# Patient Record
Sex: Male | Born: 1935 | Race: White | Hispanic: No | Marital: Single | Smoking: Former smoker
Health system: Southern US, Community
[De-identification: ages and names within clinical notes are randomized; demographics above are authoritative.]

## PROBLEM LIST (undated history)

## (undated) DIAGNOSIS — C3490 Malignant neoplasm of unspecified part of unspecified bronchus or lung: Secondary | ICD-10-CM

## (undated) DIAGNOSIS — F419 Anxiety disorder, unspecified: Secondary | ICD-10-CM

## (undated) DIAGNOSIS — R0602 Shortness of breath: Secondary | ICD-10-CM

## (undated) DIAGNOSIS — E785 Hyperlipidemia, unspecified: Secondary | ICD-10-CM

## (undated) DIAGNOSIS — R42 Dizziness and giddiness: Secondary | ICD-10-CM

## (undated) DIAGNOSIS — R6889 Other general symptoms and signs: Secondary | ICD-10-CM

## (undated) DIAGNOSIS — Z923 Personal history of irradiation: Secondary | ICD-10-CM

## (undated) DIAGNOSIS — Z9221 Personal history of antineoplastic chemotherapy: Secondary | ICD-10-CM

## (undated) DIAGNOSIS — IMO0001 Reserved for inherently not codable concepts without codable children: Secondary | ICD-10-CM

## (undated) DIAGNOSIS — J439 Emphysema, unspecified: Secondary | ICD-10-CM

## (undated) DIAGNOSIS — K5790 Diverticulosis of intestine, part unspecified, without perforation or abscess without bleeding: Secondary | ICD-10-CM

## (undated) DIAGNOSIS — I259 Chronic ischemic heart disease, unspecified: Secondary | ICD-10-CM

## (undated) DIAGNOSIS — J449 Chronic obstructive pulmonary disease, unspecified: Secondary | ICD-10-CM

## (undated) DIAGNOSIS — M1990 Unspecified osteoarthritis, unspecified site: Secondary | ICD-10-CM

## (undated) DIAGNOSIS — I313 Pericardial effusion (noninflammatory): Principal | ICD-10-CM

## (undated) DIAGNOSIS — I10 Essential (primary) hypertension: Secondary | ICD-10-CM

## (undated) DIAGNOSIS — E739 Lactose intolerance, unspecified: Secondary | ICD-10-CM

## (undated) DIAGNOSIS — K219 Gastro-esophageal reflux disease without esophagitis: Secondary | ICD-10-CM

## (undated) DIAGNOSIS — J90 Pleural effusion, not elsewhere classified: Secondary | ICD-10-CM

## (undated) HISTORY — DX: Chronic obstructive pulmonary disease, unspecified: J44.9

## (undated) HISTORY — PX: HERNIA REPAIR: SHX51

## (undated) HISTORY — DX: Malignant neoplasm of unspecified part of unspecified bronchus or lung: C34.90

## (undated) HISTORY — DX: Shortness of breath: R06.02

## (undated) HISTORY — DX: Emphysema, unspecified: J43.9

## (undated) HISTORY — DX: Essential (primary) hypertension: I10

## (undated) HISTORY — DX: Other general symptoms and signs: R68.89

## (undated) HISTORY — DX: Pericardial effusion (noninflammatory): I31.3

## (undated) HISTORY — PX: CATARACT EXTRACTION, BILATERAL: SHX1313

## (undated) HISTORY — DX: Reserved for inherently not codable concepts without codable children: IMO0001

## (undated) HISTORY — DX: Gastro-esophageal reflux disease without esophagitis: K21.9

## (undated) HISTORY — DX: Lactose intolerance, unspecified: E73.9

## (undated) HISTORY — DX: Chronic ischemic heart disease, unspecified: I25.9

## (undated) HISTORY — DX: Hyperlipidemia, unspecified: E78.5

## (undated) HISTORY — DX: Diverticulosis of intestine, part unspecified, without perforation or abscess without bleeding: K57.90

## (undated) HISTORY — DX: Pleural effusion, not elsewhere classified: J90

---

## 1999-07-07 HISTORY — PX: CORONARY STENT PLACEMENT: SHX1402

## 1999-07-30 ENCOUNTER — Inpatient Hospital Stay (HOSPITAL_COMMUNITY): Admission: EM | Admit: 1999-07-30 | Discharge: 1999-08-03 | Payer: Self-pay | Admitting: Emergency Medicine

## 1999-08-27 ENCOUNTER — Encounter (HOSPITAL_COMMUNITY): Admission: RE | Admit: 1999-08-27 | Discharge: 1999-11-25 | Payer: Self-pay | Admitting: Cardiology

## 1999-10-08 ENCOUNTER — Observation Stay (HOSPITAL_COMMUNITY): Admission: EM | Admit: 1999-10-08 | Discharge: 1999-10-09 | Payer: Self-pay | Admitting: Emergency Medicine

## 1999-10-08 ENCOUNTER — Encounter: Payer: Self-pay | Admitting: Cardiology

## 2000-01-30 ENCOUNTER — Encounter: Admission: RE | Admit: 2000-01-30 | Discharge: 2000-01-30 | Payer: Self-pay | Admitting: Internal Medicine

## 2000-01-30 ENCOUNTER — Encounter: Payer: Self-pay | Admitting: Internal Medicine

## 2000-06-04 ENCOUNTER — Inpatient Hospital Stay (HOSPITAL_COMMUNITY): Admission: RE | Admit: 2000-06-04 | Discharge: 2000-06-05 | Payer: Self-pay | Admitting: Cardiology

## 2000-11-25 ENCOUNTER — Ambulatory Visit (HOSPITAL_COMMUNITY): Admission: RE | Admit: 2000-11-25 | Discharge: 2000-11-25 | Payer: Self-pay | Admitting: Cardiology

## 2001-03-22 ENCOUNTER — Ambulatory Visit (HOSPITAL_COMMUNITY): Admission: RE | Admit: 2001-03-22 | Discharge: 2001-03-22 | Payer: Self-pay | Admitting: Cardiology

## 2003-06-13 ENCOUNTER — Ambulatory Visit (HOSPITAL_COMMUNITY): Admission: RE | Admit: 2003-06-13 | Discharge: 2003-06-13 | Payer: Self-pay | Admitting: Urology

## 2003-07-11 ENCOUNTER — Ambulatory Visit (HOSPITAL_COMMUNITY): Admission: RE | Admit: 2003-07-11 | Discharge: 2003-07-11 | Payer: Self-pay | Admitting: Cardiology

## 2003-07-11 HISTORY — PX: CARDIAC CATHETERIZATION: SHX172

## 2003-09-02 ENCOUNTER — Emergency Department (HOSPITAL_COMMUNITY): Admission: EM | Admit: 2003-09-02 | Discharge: 2003-09-02 | Payer: Self-pay | Admitting: Emergency Medicine

## 2007-04-08 DIAGNOSIS — IMO0001 Reserved for inherently not codable concepts without codable children: Secondary | ICD-10-CM

## 2007-04-08 HISTORY — DX: Reserved for inherently not codable concepts without codable children: IMO0001

## 2007-05-09 HISTORY — PX: CARDIOVASCULAR STRESS TEST: SHX262

## 2009-01-04 ENCOUNTER — Encounter: Admission: RE | Admit: 2009-01-04 | Discharge: 2009-01-04 | Payer: Self-pay | Admitting: Internal Medicine

## 2009-01-08 ENCOUNTER — Ambulatory Visit: Payer: Self-pay | Admitting: Oncology

## 2009-01-12 ENCOUNTER — Encounter: Admission: RE | Admit: 2009-01-12 | Discharge: 2009-01-12 | Payer: Self-pay | Admitting: Family Medicine

## 2009-01-15 ENCOUNTER — Ambulatory Visit (HOSPITAL_COMMUNITY): Admission: RE | Admit: 2009-01-15 | Discharge: 2009-01-15 | Payer: Self-pay | Admitting: Internal Medicine

## 2009-01-23 ENCOUNTER — Ambulatory Visit (HOSPITAL_COMMUNITY): Admission: RE | Admit: 2009-01-23 | Discharge: 2009-01-23 | Payer: Self-pay | Admitting: Otolaryngology

## 2009-01-23 LAB — COMPREHENSIVE METABOLIC PANEL
ALT: 21 U/L (ref 0–53)
BUN: 17 mg/dL (ref 6–23)
CO2: 29 mEq/L (ref 19–32)
Calcium: 9.3 mg/dL (ref 8.4–10.5)
Chloride: 106 mEq/L (ref 96–112)
Creatinine, Ser: 0.99 mg/dL (ref 0.40–1.50)

## 2009-01-23 LAB — CBC WITH DIFFERENTIAL/PLATELET
Basophils Absolute: 0.1 10*3/uL (ref 0.0–0.1)
Eosinophils Absolute: 0.2 10*3/uL (ref 0.0–0.5)
HCT: 44.7 % (ref 38.4–49.9)
HGB: 14.7 g/dL (ref 13.0–17.1)
MONO#: 0.5 10*3/uL (ref 0.1–0.9)
NEUT%: 55.3 % (ref 39.0–75.0)
WBC: 5.9 10*3/uL (ref 4.0–10.3)
lymph#: 1.9 10*3/uL (ref 0.9–3.3)

## 2009-01-23 LAB — LACTATE DEHYDROGENASE: LDH: 128 U/L (ref 94–250)

## 2009-02-01 ENCOUNTER — Ambulatory Visit (HOSPITAL_COMMUNITY): Admission: RE | Admit: 2009-02-01 | Discharge: 2009-02-01 | Payer: Self-pay | Admitting: Oncology

## 2009-02-01 ENCOUNTER — Encounter (INDEPENDENT_AMBULATORY_CARE_PROVIDER_SITE_OTHER): Payer: Self-pay | Admitting: Interventional Radiology

## 2009-02-07 ENCOUNTER — Ambulatory Visit: Payer: Self-pay | Admitting: Oncology

## 2009-02-12 ENCOUNTER — Ambulatory Visit (HOSPITAL_COMMUNITY): Admission: RE | Admit: 2009-02-12 | Discharge: 2009-02-12 | Payer: Self-pay | Admitting: Otolaryngology

## 2009-02-12 ENCOUNTER — Encounter (INDEPENDENT_AMBULATORY_CARE_PROVIDER_SITE_OTHER): Payer: Self-pay | Admitting: Otolaryngology

## 2009-02-14 ENCOUNTER — Ambulatory Visit: Admission: RE | Admit: 2009-02-14 | Discharge: 2009-04-06 | Payer: Self-pay | Admitting: Radiation Oncology

## 2009-02-21 LAB — LACTATE DEHYDROGENASE: LDH: 160 U/L (ref 94–250)

## 2009-02-21 LAB — CBC WITH DIFFERENTIAL/PLATELET
Basophils Absolute: 0 10*3/uL (ref 0.0–0.1)
Eosinophils Absolute: 0.4 10*3/uL (ref 0.0–0.5)
HCT: 44.8 % (ref 38.4–49.9)
HGB: 15 g/dL (ref 13.0–17.1)
LYMPH%: 26.6 % (ref 14.0–49.0)
MCV: 92 fL (ref 79.3–98.0)
MONO#: 0.7 10*3/uL (ref 0.1–0.9)
NEUT#: 4.3 10*3/uL (ref 1.5–6.5)
NEUT%: 58.1 % (ref 39.0–75.0)
Platelets: 241 10*3/uL (ref 140–400)
RBC: 4.88 10*6/uL (ref 4.20–5.82)
WBC: 7.4 10*3/uL (ref 4.0–10.3)

## 2009-02-21 LAB — COMPREHENSIVE METABOLIC PANEL
Albumin: 4.4 g/dL (ref 3.5–5.2)
BUN: 14 mg/dL (ref 6–23)
CO2: 21 mEq/L (ref 19–32)
Glucose, Bld: 86 mg/dL (ref 70–99)
Sodium: 140 mEq/L (ref 135–145)
Total Bilirubin: 0.5 mg/dL (ref 0.3–1.2)
Total Protein: 6.8 g/dL (ref 6.0–8.3)

## 2009-02-28 LAB — CBC WITH DIFFERENTIAL/PLATELET
BASO%: 1.1 % (ref 0.0–2.0)
HCT: 45.2 % (ref 38.4–49.9)
MCHC: 33.8 g/dL (ref 32.0–36.0)
MONO#: 0.4 10*3/uL (ref 0.1–0.9)
NEUT#: 3.6 10*3/uL (ref 1.5–6.5)
RBC: 4.98 10*6/uL (ref 4.20–5.82)
WBC: 5.5 10*3/uL (ref 4.0–10.3)
lymph#: 1.3 10*3/uL (ref 0.9–3.3)

## 2009-03-07 ENCOUNTER — Ambulatory Visit: Payer: Self-pay | Admitting: Oncology

## 2009-03-07 LAB — CBC WITH DIFFERENTIAL/PLATELET
Basophils Absolute: 0 10*3/uL (ref 0.0–0.1)
EOS%: 3.5 % (ref 0.0–7.0)
HCT: 41.3 % (ref 38.4–49.9)
HGB: 14 g/dL (ref 13.0–17.1)
LYMPH%: 22.9 % (ref 14.0–49.0)
MCH: 30.7 pg (ref 27.2–33.4)
MCV: 90.6 fL (ref 79.3–98.0)
MONO%: 8.5 % (ref 0.0–14.0)
NEUT%: 64.1 % (ref 39.0–75.0)
RDW: 12.7 % (ref 11.0–14.6)

## 2009-03-07 LAB — COMPREHENSIVE METABOLIC PANEL
Alkaline Phosphatase: 84 U/L (ref 39–117)
BUN: 11 mg/dL (ref 6–23)
Glucose, Bld: 92 mg/dL (ref 70–99)
Total Bilirubin: 0.4 mg/dL (ref 0.3–1.2)

## 2009-03-14 LAB — CBC WITH DIFFERENTIAL/PLATELET
Basophils Absolute: 0 10*3/uL (ref 0.0–0.1)
Eosinophils Absolute: 0 10*3/uL (ref 0.0–0.5)
HGB: 13.9 g/dL (ref 13.0–17.1)
LYMPH%: 22.8 % (ref 14.0–49.0)
MCV: 90.7 fL (ref 79.3–98.0)
MONO#: 0.4 10*3/uL (ref 0.1–0.9)
MONO%: 10.1 % (ref 0.0–14.0)
NEUT#: 2.3 10*3/uL (ref 1.5–6.5)
Platelets: 228 10*3/uL (ref 140–400)
RDW: 12.8 % (ref 11.0–14.6)

## 2009-03-19 LAB — COMPREHENSIVE METABOLIC PANEL
Alkaline Phosphatase: 76 U/L (ref 39–117)
BUN: 14 mg/dL (ref 6–23)
CO2: 26 mEq/L (ref 19–32)
Creatinine, Ser: 0.79 mg/dL (ref 0.40–1.50)
Glucose, Bld: 115 mg/dL — ABNORMAL HIGH (ref 70–99)
Sodium: 139 mEq/L (ref 135–145)
Total Bilirubin: 0.7 mg/dL (ref 0.3–1.2)
Total Protein: 6.4 g/dL (ref 6.0–8.3)

## 2009-03-19 LAB — CBC WITH DIFFERENTIAL/PLATELET
Eosinophils Absolute: 0 10*3/uL (ref 0.0–0.5)
HCT: 42.2 % (ref 38.4–49.9)
LYMPH%: 17.8 % (ref 14.0–49.0)
MCV: 92.6 fL (ref 79.3–98.0)
MONO%: 7.6 % (ref 0.0–14.0)
NEUT#: 2.4 10*3/uL (ref 1.5–6.5)
NEUT%: 73.2 % (ref 39.0–75.0)
Platelets: 231 10*3/uL (ref 140–400)
RBC: 4.56 10*6/uL (ref 4.20–5.82)

## 2009-03-19 LAB — LACTATE DEHYDROGENASE: LDH: 154 U/L (ref 94–250)

## 2009-03-21 LAB — CBC WITH DIFFERENTIAL/PLATELET
Eosinophils Absolute: 0 10*3/uL (ref 0.0–0.5)
HCT: 41.4 % (ref 38.4–49.9)
HGB: 14 g/dL (ref 13.0–17.1)
LYMPH%: 21.1 % (ref 14.0–49.0)
MONO#: 0.5 10*3/uL (ref 0.1–0.9)
NEUT#: 1.9 10*3/uL (ref 1.5–6.5)
NEUT%: 62 % (ref 39.0–75.0)
Platelets: 197 10*3/uL (ref 140–400)
WBC: 3.1 10*3/uL — ABNORMAL LOW (ref 4.0–10.3)
lymph#: 0.7 10*3/uL — ABNORMAL LOW (ref 0.9–3.3)

## 2009-03-28 LAB — CBC WITH DIFFERENTIAL/PLATELET
BASO%: 1.1 % (ref 0.0–2.0)
Basophils Absolute: 0 10*3/uL (ref 0.0–0.1)
EOS%: 1.1 % (ref 0.0–7.0)
HCT: 39.7 % (ref 38.4–49.9)
HGB: 13.6 g/dL (ref 13.0–17.1)
LYMPH%: 14 % (ref 14.0–49.0)
MCH: 30.9 pg (ref 27.2–33.4)
MCHC: 34.3 g/dL (ref 32.0–36.0)
MONO#: 0.5 10*3/uL (ref 0.1–0.9)
NEUT%: 69.3 % (ref 39.0–75.0)
Platelets: 170 10*3/uL (ref 140–400)
lymph#: 0.5 10*3/uL — ABNORMAL LOW (ref 0.9–3.3)

## 2009-03-28 LAB — COMPREHENSIVE METABOLIC PANEL
ALT: 19 U/L (ref 0–53)
BUN: 14 mg/dL (ref 6–23)
CO2: 25 mEq/L (ref 19–32)
Calcium: 8.7 mg/dL (ref 8.4–10.5)
Chloride: 103 mEq/L (ref 96–112)
Creatinine, Ser: 0.78 mg/dL (ref 0.40–1.50)
Total Bilirubin: 0.5 mg/dL (ref 0.3–1.2)

## 2009-03-28 LAB — LACTATE DEHYDROGENASE: LDH: 191 U/L (ref 94–250)

## 2009-04-04 LAB — CBC WITH DIFFERENTIAL/PLATELET
Basophils Absolute: 0 10*3/uL (ref 0.0–0.1)
Eosinophils Absolute: 0 10*3/uL (ref 0.0–0.5)
HCT: 37.9 % — ABNORMAL LOW (ref 38.4–49.9)
HGB: 12.8 g/dL — ABNORMAL LOW (ref 13.0–17.1)
MONO#: 0.5 10*3/uL (ref 0.1–0.9)
NEUT#: 1.4 10*3/uL — ABNORMAL LOW (ref 1.5–6.5)
NEUT%: 59 % (ref 39.0–75.0)
RDW: 14.2 % (ref 11.0–14.6)
lymph#: 0.4 10*3/uL — ABNORMAL LOW (ref 0.9–3.3)

## 2009-04-05 ENCOUNTER — Ambulatory Visit: Payer: Self-pay | Admitting: Oncology

## 2009-04-08 ENCOUNTER — Ambulatory Visit: Admission: RE | Admit: 2009-04-08 | Discharge: 2009-04-20 | Payer: Self-pay | Admitting: Radiation Oncology

## 2009-04-11 LAB — CBC WITH DIFFERENTIAL/PLATELET
BASO%: 0.8 % (ref 0.0–2.0)
LYMPH%: 29.6 % (ref 14.0–49.0)
MCHC: 34.5 g/dL (ref 32.0–36.0)
MONO#: 0.6 10*3/uL (ref 0.1–0.9)
RBC: 4.15 10*6/uL — ABNORMAL LOW (ref 4.20–5.82)
RDW: 15.5 % — ABNORMAL HIGH (ref 11.0–14.6)
WBC: 2.9 10*3/uL — ABNORMAL LOW (ref 4.0–10.3)
lymph#: 0.9 10*3/uL (ref 0.9–3.3)

## 2009-04-11 LAB — COMPREHENSIVE METABOLIC PANEL
Albumin: 3.9 g/dL (ref 3.5–5.2)
CO2: 23 mEq/L (ref 19–32)
Glucose, Bld: 92 mg/dL (ref 70–99)
Potassium: 4 mEq/L (ref 3.5–5.3)
Sodium: 141 mEq/L (ref 135–145)
Total Protein: 6.1 g/dL (ref 6.0–8.3)

## 2009-04-11 LAB — LACTATE DEHYDROGENASE: LDH: 197 U/L (ref 94–250)

## 2009-04-27 LAB — COMPREHENSIVE METABOLIC PANEL
ALT: 13 U/L (ref 0–53)
AST: 18 U/L (ref 0–37)
Albumin: 4.2 g/dL (ref 3.5–5.2)
BUN: 15 mg/dL (ref 6–23)
Calcium: 8.8 mg/dL (ref 8.4–10.5)
Chloride: 107 mEq/L (ref 96–112)
Potassium: 4.5 mEq/L (ref 3.5–5.3)

## 2009-04-27 LAB — CBC WITH DIFFERENTIAL/PLATELET
BASO%: 1.4 % (ref 0.0–2.0)
HCT: 38.7 % (ref 38.4–49.9)
HGB: 13.3 g/dL (ref 13.0–17.1)
MCHC: 34.3 g/dL (ref 32.0–36.0)
MONO#: 0.6 10*3/uL (ref 0.1–0.9)
NEUT#: 1 10*3/uL — ABNORMAL LOW (ref 1.5–6.5)
NEUT%: 24.8 % — ABNORMAL LOW (ref 39.0–75.0)
WBC: 3.9 10*3/uL — ABNORMAL LOW (ref 4.0–10.3)
lymph#: 2.2 10*3/uL (ref 0.9–3.3)

## 2009-05-22 ENCOUNTER — Ambulatory Visit: Payer: Self-pay | Admitting: Oncology

## 2009-05-22 ENCOUNTER — Ambulatory Visit (HOSPITAL_COMMUNITY): Admission: RE | Admit: 2009-05-22 | Discharge: 2009-05-22 | Payer: Self-pay | Admitting: Oncology

## 2009-05-24 LAB — CBC WITH DIFFERENTIAL/PLATELET
Basophils Absolute: 0 10*3/uL (ref 0.0–0.1)
Eosinophils Absolute: 0.2 10*3/uL (ref 0.0–0.5)
HGB: 13.7 g/dL (ref 13.0–17.1)
LYMPH%: 27.2 % (ref 14.0–49.0)
MCV: 96 fL (ref 79.3–98.0)
MONO%: 14.2 % — ABNORMAL HIGH (ref 0.0–14.0)
NEUT#: 2.3 10*3/uL (ref 1.5–6.5)
Platelets: 217 10*3/uL (ref 140–400)

## 2009-05-24 LAB — CEA: CEA: 1.5 ng/mL (ref 0.0–5.0)

## 2009-05-24 LAB — COMPREHENSIVE METABOLIC PANEL
CO2: 29 mEq/L (ref 19–32)
Creatinine, Ser: 0.88 mg/dL (ref 0.40–1.50)
Glucose, Bld: 81 mg/dL (ref 70–99)
Total Bilirubin: 0.6 mg/dL (ref 0.3–1.2)
Total Protein: 6.6 g/dL (ref 6.0–8.3)

## 2009-05-30 LAB — CBC WITH DIFFERENTIAL/PLATELET
BASO%: 0.9 % (ref 0.0–2.0)
HCT: 44.1 % (ref 38.4–49.9)
MCHC: 34 g/dL (ref 32.0–36.0)
MONO#: 0.6 10*3/uL (ref 0.1–0.9)
NEUT%: 47.8 % (ref 39.0–75.0)
RDW: 13.5 % (ref 11.0–14.6)
WBC: 4.5 10*3/uL (ref 4.0–10.3)
lymph#: 1.4 10*3/uL (ref 0.9–3.3)

## 2009-06-06 LAB — CBC WITH DIFFERENTIAL/PLATELET
BASO%: 0.9 % (ref 0.0–2.0)
HCT: 38.5 % (ref 38.4–49.9)
MCHC: 35 g/dL (ref 32.0–36.0)
MONO#: 0.1 10*3/uL (ref 0.1–0.9)
NEUT#: 1.8 10*3/uL (ref 1.5–6.5)
RBC: 4.02 10*6/uL — ABNORMAL LOW (ref 4.20–5.82)
WBC: 3.2 10*3/uL — ABNORMAL LOW (ref 4.0–10.3)
lymph#: 1.2 10*3/uL (ref 0.9–3.3)

## 2009-06-13 LAB — CBC WITH DIFFERENTIAL/PLATELET
Basophils Absolute: 0 10*3/uL (ref 0.0–0.1)
Eosinophils Absolute: 0.1 10*3/uL (ref 0.0–0.5)
HCT: 41.2 % (ref 38.4–49.9)
HGB: 13.7 g/dL (ref 13.0–17.1)
MONO#: 0.5 10*3/uL (ref 0.1–0.9)
NEUT%: 61.4 % (ref 39.0–75.0)
WBC: 4 10*3/uL (ref 4.0–10.3)
lymph#: 1 10*3/uL (ref 0.9–3.3)

## 2009-06-13 LAB — COMPREHENSIVE METABOLIC PANEL
Albumin: 4.3 g/dL (ref 3.5–5.2)
Alkaline Phosphatase: 71 U/L (ref 39–117)
CO2: 27 mEq/L (ref 19–32)
Glucose, Bld: 113 mg/dL — ABNORMAL HIGH (ref 70–99)
Potassium: 4 mEq/L (ref 3.5–5.3)
Sodium: 141 mEq/L (ref 135–145)
Total Protein: 6.6 g/dL (ref 6.0–8.3)

## 2009-06-25 ENCOUNTER — Ambulatory Visit: Payer: Self-pay | Admitting: Oncology

## 2009-06-27 LAB — CBC WITH DIFFERENTIAL/PLATELET
BASO%: 0.4 % (ref 0.0–2.0)
EOS%: 0.8 % (ref 0.0–7.0)
Eosinophils Absolute: 0 10*3/uL (ref 0.0–0.5)
MCV: 95.9 fL (ref 79.3–98.0)
MONO%: 14.7 % — ABNORMAL HIGH (ref 0.0–14.0)
NEUT#: 2.6 10*3/uL (ref 1.5–6.5)
RBC: 3.78 10*6/uL — ABNORMAL LOW (ref 4.20–5.82)
RDW: 13 % (ref 11.0–14.6)
WBC: 4 10*3/uL (ref 4.0–10.3)

## 2009-06-27 LAB — BASIC METABOLIC PANEL
Calcium: 9.2 mg/dL (ref 8.4–10.5)
Chloride: 104 mEq/L (ref 96–112)
Creatinine, Ser: 0.93 mg/dL (ref 0.40–1.50)
Sodium: 136 mEq/L (ref 135–145)

## 2009-07-05 LAB — CBC WITH DIFFERENTIAL/PLATELET
Basophils Absolute: 0 10*3/uL (ref 0.0–0.1)
Eosinophils Absolute: 0 10*3/uL (ref 0.0–0.5)
HGB: 12.1 g/dL — ABNORMAL LOW (ref 13.0–17.1)
MCV: 94.9 fL (ref 79.3–98.0)
NEUT#: 0.9 10*3/uL — ABNORMAL LOW (ref 1.5–6.5)
RDW: 13.2 % (ref 11.0–14.6)
lymph#: 0.8 10*3/uL — ABNORMAL LOW (ref 0.9–3.3)

## 2009-07-10 LAB — CBC WITH DIFFERENTIAL/PLATELET
BASO%: 0.4 % (ref 0.0–2.0)
EOS%: 1 % (ref 0.0–7.0)
HCT: 37.5 % — ABNORMAL LOW (ref 38.4–49.9)
HGB: 12.8 g/dL — ABNORMAL LOW (ref 13.0–17.1)
MCH: 32.5 pg (ref 27.2–33.4)
MCHC: 34.2 g/dL (ref 32.0–36.0)
MONO#: 0.5 10*3/uL (ref 0.1–0.9)
NEUT%: 64.3 % (ref 39.0–75.0)
RDW: 13.9 % (ref 11.0–14.6)
WBC: 3.3 10*3/uL — ABNORMAL LOW (ref 4.0–10.3)
lymph#: 0.7 10*3/uL — ABNORMAL LOW (ref 0.9–3.3)

## 2009-07-10 LAB — COMPREHENSIVE METABOLIC PANEL
ALT: 14 U/L (ref 0–53)
AST: 19 U/L (ref 0–37)
Albumin: 4 g/dL (ref 3.5–5.2)
CO2: 29 mEq/L (ref 19–32)
Calcium: 9.3 mg/dL (ref 8.4–10.5)
Chloride: 105 mEq/L (ref 96–112)
Potassium: 4.3 mEq/L (ref 3.5–5.3)
Total Protein: 7.1 g/dL (ref 6.0–8.3)

## 2009-07-10 LAB — LACTATE DEHYDROGENASE: LDH: 182 U/L (ref 94–250)

## 2009-07-11 ENCOUNTER — Ambulatory Visit (HOSPITAL_COMMUNITY): Admission: RE | Admit: 2009-07-11 | Discharge: 2009-07-11 | Payer: Self-pay | Admitting: Oncology

## 2009-07-18 ENCOUNTER — Ambulatory Visit (HOSPITAL_COMMUNITY): Admission: RE | Admit: 2009-07-18 | Discharge: 2009-07-18 | Payer: Self-pay | Admitting: Oncology

## 2009-07-18 LAB — CBC WITH DIFFERENTIAL/PLATELET
BASO%: 1 % (ref 0.0–2.0)
Basophils Absolute: 0 10*3/uL (ref 0.0–0.1)
EOS%: 2.7 % (ref 0.0–7.0)
MCH: 31.4 pg (ref 27.2–33.4)
MCHC: 33.5 g/dL (ref 32.0–36.0)
MCV: 93.9 fL (ref 79.3–98.0)
MONO%: 27.2 % — ABNORMAL HIGH (ref 0.0–14.0)
RBC: 4.07 10*6/uL — ABNORMAL LOW (ref 4.20–5.82)
RDW: 13.7 % (ref 11.0–14.6)
lymph#: 0.9 10*3/uL (ref 0.9–3.3)

## 2009-07-25 ENCOUNTER — Ambulatory Visit: Payer: Self-pay | Admitting: Oncology

## 2009-07-25 LAB — CBC WITH DIFFERENTIAL/PLATELET
BASO%: 1.1 % (ref 0.0–2.0)
LYMPH%: 19 % (ref 14.0–49.0)
MCHC: 34.3 g/dL (ref 32.0–36.0)
MONO#: 1.1 10*3/uL — ABNORMAL HIGH (ref 0.1–0.9)
MONO%: 19 % — ABNORMAL HIGH (ref 0.0–14.0)
Platelets: 287 10*3/uL (ref 140–400)
RBC: 4.07 10*6/uL — ABNORMAL LOW (ref 4.20–5.82)
RDW: 14.1 % (ref 11.0–14.6)
WBC: 5.8 10*3/uL (ref 4.0–10.3)

## 2009-08-09 ENCOUNTER — Ambulatory Visit (HOSPITAL_COMMUNITY): Admission: RE | Admit: 2009-08-09 | Discharge: 2009-08-09 | Payer: Self-pay | Admitting: Oncology

## 2009-08-09 LAB — CBC WITH DIFFERENTIAL/PLATELET
BASO%: 0.4 % (ref 0.0–2.0)
EOS%: 2.2 % (ref 0.0–7.0)
Eosinophils Absolute: 0.2 10*3/uL (ref 0.0–0.5)
MCH: 30.5 pg (ref 27.2–33.4)
MCHC: 32.6 g/dL (ref 32.0–36.0)
MCV: 93.4 fL (ref 79.3–98.0)
MONO%: 14.7 % — ABNORMAL HIGH (ref 0.0–14.0)
NEUT#: 4.9 10*3/uL (ref 1.5–6.5)
RBC: 4.07 10*6/uL — ABNORMAL LOW (ref 4.20–5.82)
RDW: 14.4 % (ref 11.0–14.6)
nRBC: 0 % (ref 0–0)

## 2009-08-09 LAB — COMPREHENSIVE METABOLIC PANEL
ALT: 9 U/L (ref 0–53)
Alkaline Phosphatase: 102 U/L (ref 39–117)
Creatinine, Ser: 0.82 mg/dL (ref 0.40–1.50)
Sodium: 141 mEq/L (ref 135–145)
Total Bilirubin: 0.3 mg/dL (ref 0.3–1.2)
Total Protein: 6.2 g/dL (ref 6.0–8.3)

## 2009-08-22 LAB — COMPREHENSIVE METABOLIC PANEL
ALT: 9 U/L (ref 0–53)
BUN: 14 mg/dL (ref 6–23)
CO2: 27 mEq/L (ref 19–32)
Calcium: 8.9 mg/dL (ref 8.4–10.5)
Chloride: 105 mEq/L (ref 96–112)
Creatinine, Ser: 0.88 mg/dL (ref 0.40–1.50)
Total Bilirubin: 0.4 mg/dL (ref 0.3–1.2)

## 2009-08-22 LAB — CBC WITH DIFFERENTIAL/PLATELET
BASO%: 0.2 % (ref 0.0–2.0)
Basophils Absolute: 0 10*3/uL (ref 0.0–0.1)
EOS%: 0.6 % (ref 0.0–7.0)
HCT: 35.5 % — ABNORMAL LOW (ref 38.4–49.9)
HGB: 12 g/dL — ABNORMAL LOW (ref 13.0–17.1)
LYMPH%: 9.2 % — ABNORMAL LOW (ref 14.0–49.0)
MCH: 31.2 pg (ref 27.2–33.4)
MCHC: 33.7 g/dL (ref 32.0–36.0)
MONO#: 0.8 10*3/uL (ref 0.1–0.9)
NEUT%: 82.2 % — ABNORMAL HIGH (ref 39.0–75.0)
Platelets: 230 10*3/uL (ref 140–400)

## 2009-08-22 LAB — LACTATE DEHYDROGENASE: LDH: 192 U/L (ref 94–250)

## 2009-09-05 ENCOUNTER — Ambulatory Visit: Payer: Self-pay | Admitting: Oncology

## 2009-09-06 LAB — COMPREHENSIVE METABOLIC PANEL
ALT: 9 U/L (ref 0–53)
AST: 15 U/L (ref 0–37)
Albumin: 4.1 g/dL (ref 3.5–5.2)
Alkaline Phosphatase: 80 U/L (ref 39–117)
Potassium: 4.5 mEq/L (ref 3.5–5.3)
Sodium: 141 mEq/L (ref 135–145)
Total Protein: 6.3 g/dL (ref 6.0–8.3)

## 2009-09-06 LAB — CBC WITH DIFFERENTIAL/PLATELET
BASO%: 2.1 % — ABNORMAL HIGH (ref 0.0–2.0)
Eosinophils Absolute: 0.1 10*3/uL (ref 0.0–0.5)
LYMPH%: 17.6 % (ref 14.0–49.0)
MCHC: 32 g/dL (ref 32.0–36.0)
MCV: 93.5 fL (ref 79.3–98.0)
MONO%: 16.1 % — ABNORMAL HIGH (ref 0.0–14.0)
Platelets: 250 10*3/uL (ref 140–400)
RBC: 4.01 10*6/uL — ABNORMAL LOW (ref 4.20–5.82)
nRBC: 0 % (ref 0–0)

## 2009-09-12 ENCOUNTER — Inpatient Hospital Stay (HOSPITAL_COMMUNITY): Admission: EM | Admit: 2009-09-12 | Discharge: 2009-09-13 | Payer: Self-pay | Admitting: Emergency Medicine

## 2009-09-13 ENCOUNTER — Encounter (INDEPENDENT_AMBULATORY_CARE_PROVIDER_SITE_OTHER): Payer: Self-pay | Admitting: Internal Medicine

## 2009-09-20 LAB — CBC WITH DIFFERENTIAL/PLATELET
BASO%: 0.2 % (ref 0.0–2.0)
EOS%: 2.2 % (ref 0.0–7.0)
HCT: 32.2 % — ABNORMAL LOW (ref 38.4–49.9)
MCH: 30.3 pg (ref 27.2–33.4)
MCHC: 32 g/dL (ref 32.0–36.0)
MONO%: 12.6 % (ref 0.0–14.0)
NEUT%: 72.7 % (ref 39.0–75.0)
lymph#: 1.2 10*3/uL (ref 0.9–3.3)

## 2009-10-05 DIAGNOSIS — I3139 Other pericardial effusion (noninflammatory): Secondary | ICD-10-CM

## 2009-10-05 DIAGNOSIS — I313 Pericardial effusion (noninflammatory): Secondary | ICD-10-CM

## 2009-10-05 HISTORY — PX: PERICARDIAL WINDOW: SHX2213

## 2009-10-05 HISTORY — DX: Pericardial effusion (noninflammatory): I31.3

## 2009-10-05 HISTORY — DX: Other pericardial effusion (noninflammatory): I31.39

## 2009-10-11 ENCOUNTER — Ambulatory Visit: Payer: Self-pay | Admitting: Oncology

## 2009-10-12 ENCOUNTER — Ambulatory Visit (HOSPITAL_COMMUNITY): Admission: RE | Admit: 2009-10-12 | Discharge: 2009-10-12 | Payer: Self-pay | Admitting: Oncology

## 2009-10-15 LAB — CBC WITH DIFFERENTIAL/PLATELET
Basophils Absolute: 0.1 10*3/uL (ref 0.0–0.1)
EOS%: 3.4 % (ref 0.0–7.0)
HCT: 38.3 % — ABNORMAL LOW (ref 38.4–49.9)
HGB: 12.3 g/dL — ABNORMAL LOW (ref 13.0–17.1)
LYMPH%: 20.4 % (ref 14.0–49.0)
MCH: 30 pg (ref 27.2–33.4)
NEUT%: 63.4 % (ref 39.0–75.0)
Platelets: 248 10*3/uL (ref 140–400)
lymph#: 1 10*3/uL (ref 0.9–3.3)

## 2009-10-15 LAB — COMPREHENSIVE METABOLIC PANEL
Albumin: 4 g/dL (ref 3.5–5.2)
Alkaline Phosphatase: 71 U/L (ref 39–117)
BUN: 18 mg/dL (ref 6–23)
Calcium: 9.2 mg/dL (ref 8.4–10.5)
Chloride: 109 mEq/L (ref 96–112)
Glucose, Bld: 125 mg/dL — ABNORMAL HIGH (ref 70–99)
Potassium: 3.9 mEq/L (ref 3.5–5.3)

## 2009-10-24 ENCOUNTER — Ambulatory Visit: Payer: Self-pay | Admitting: Cardiothoracic Surgery

## 2009-10-24 ENCOUNTER — Inpatient Hospital Stay (HOSPITAL_COMMUNITY): Admission: RE | Admit: 2009-10-24 | Discharge: 2009-10-30 | Payer: Self-pay | Admitting: Cardiothoracic Surgery

## 2009-10-25 ENCOUNTER — Ambulatory Visit: Payer: Self-pay | Admitting: Cardiothoracic Surgery

## 2009-10-25 ENCOUNTER — Encounter: Payer: Self-pay | Admitting: Cardiothoracic Surgery

## 2009-11-02 ENCOUNTER — Ambulatory Visit: Payer: Self-pay | Admitting: Cardiothoracic Surgery

## 2009-11-06 ENCOUNTER — Ambulatory Visit (HOSPITAL_COMMUNITY): Admission: RE | Admit: 2009-11-06 | Discharge: 2009-11-06 | Payer: Self-pay | Admitting: Cardiology

## 2009-11-06 ENCOUNTER — Ambulatory Visit: Payer: Self-pay | Admitting: Cardiology

## 2009-11-13 ENCOUNTER — Ambulatory Visit: Payer: Self-pay | Admitting: Oncology

## 2009-11-13 LAB — CBC WITH DIFFERENTIAL/PLATELET
BASO%: 1.1 % (ref 0.0–2.0)
EOS%: 5.3 % (ref 0.0–7.0)
HCT: 38.3 % — ABNORMAL LOW (ref 38.4–49.9)
LYMPH%: 19.3 % (ref 14.0–49.0)
MCH: 29.7 pg (ref 27.2–33.4)
MCHC: 33.1 g/dL (ref 32.0–36.0)
MCV: 89.8 fL (ref 79.3–98.0)
MONO%: 10.8 % (ref 0.0–14.0)
NEUT%: 63.5 % (ref 39.0–75.0)
Platelets: 306 10*3/uL (ref 140–400)
RBC: 4.26 10*6/uL (ref 4.20–5.82)

## 2009-11-13 LAB — COMPREHENSIVE METABOLIC PANEL
ALT: 13 U/L (ref 0–53)
AST: 18 U/L (ref 0–37)
Alkaline Phosphatase: 75 U/L (ref 39–117)
CO2: 26 mEq/L (ref 19–32)
Creatinine, Ser: 0.93 mg/dL (ref 0.40–1.50)
Sodium: 142 mEq/L (ref 135–145)
Total Bilirubin: 0.3 mg/dL (ref 0.3–1.2)

## 2009-11-13 LAB — LACTATE DEHYDROGENASE: LDH: 149 U/L (ref 94–250)

## 2009-11-21 ENCOUNTER — Encounter: Admission: RE | Admit: 2009-11-21 | Discharge: 2009-11-21 | Payer: Self-pay | Admitting: Cardiothoracic Surgery

## 2009-11-21 ENCOUNTER — Ambulatory Visit: Payer: Self-pay | Admitting: Cardiothoracic Surgery

## 2009-12-05 ENCOUNTER — Ambulatory Visit: Payer: Self-pay | Admitting: Cardiothoracic Surgery

## 2009-12-05 ENCOUNTER — Encounter: Admission: RE | Admit: 2009-12-05 | Discharge: 2009-12-05 | Payer: Self-pay | Admitting: Cardiothoracic Surgery

## 2009-12-21 ENCOUNTER — Ambulatory Visit: Payer: Self-pay | Admitting: Oncology

## 2009-12-25 ENCOUNTER — Ambulatory Visit: Payer: Self-pay | Admitting: Cardiology

## 2009-12-25 ENCOUNTER — Encounter: Admission: RE | Admit: 2009-12-25 | Discharge: 2009-12-25 | Payer: Self-pay | Admitting: Cardiology

## 2009-12-25 ENCOUNTER — Ambulatory Visit (HOSPITAL_COMMUNITY): Admission: RE | Admit: 2009-12-25 | Discharge: 2009-12-25 | Payer: Self-pay | Admitting: Oncology

## 2009-12-25 ENCOUNTER — Encounter: Payer: Self-pay | Admitting: Pulmonary Disease

## 2009-12-25 LAB — CBC WITH DIFFERENTIAL/PLATELET
Basophils Absolute: 0 10*3/uL (ref 0.0–0.1)
EOS%: 5.2 % (ref 0.0–7.0)
Eosinophils Absolute: 0.3 10*3/uL (ref 0.0–0.5)
HGB: 13.2 g/dL (ref 13.0–17.1)
MCV: 84.7 fL (ref 79.3–98.0)
MONO%: 14.3 % — ABNORMAL HIGH (ref 0.0–14.0)
NEUT#: 4.4 10*3/uL (ref 1.5–6.5)
RBC: 4.75 10*6/uL (ref 4.20–5.82)
RDW: 14.4 % (ref 11.0–14.6)
lymph#: 0.6 10*3/uL — ABNORMAL LOW (ref 0.9–3.3)

## 2009-12-25 LAB — COMPREHENSIVE METABOLIC PANEL
ALT: 13 U/L (ref 0–53)
Albumin: 3.7 g/dL (ref 3.5–5.2)
Alkaline Phosphatase: 108 U/L (ref 39–117)
Potassium: 4.4 mEq/L (ref 3.5–5.3)
Sodium: 138 mEq/L (ref 135–145)
Total Bilirubin: 0.7 mg/dL (ref 0.3–1.2)
Total Protein: 6.6 g/dL (ref 6.0–8.3)

## 2009-12-25 LAB — BRAIN NATRIURETIC PEPTIDE: Brain Natriuretic Peptide: 127 pg/mL — ABNORMAL HIGH (ref 0.0–100.0)

## 2009-12-25 LAB — LACTATE DEHYDROGENASE: LDH: 228 U/L (ref 94–250)

## 2009-12-25 LAB — D-DIMER, QUANTITATIVE: D-Dimer, Quant: 1.46 ug/mL-FEU — ABNORMAL HIGH (ref 0.00–0.48)

## 2010-01-01 ENCOUNTER — Encounter: Payer: Self-pay | Admitting: Pulmonary Disease

## 2010-01-01 ENCOUNTER — Ambulatory Visit (HOSPITAL_COMMUNITY): Admission: RE | Admit: 2010-01-01 | Discharge: 2010-01-01 | Payer: Self-pay | Admitting: Oncology

## 2010-01-01 ENCOUNTER — Encounter (HOSPITAL_COMMUNITY): Payer: Self-pay | Admitting: Oncology

## 2010-01-01 ENCOUNTER — Ambulatory Visit: Payer: Self-pay | Admitting: Cardiology

## 2010-01-03 ENCOUNTER — Ambulatory Visit: Payer: Self-pay | Admitting: Cardiothoracic Surgery

## 2010-01-04 ENCOUNTER — Ambulatory Visit (HOSPITAL_COMMUNITY): Admission: RE | Admit: 2010-01-04 | Discharge: 2010-01-04 | Payer: Self-pay | Admitting: Oncology

## 2010-01-04 ENCOUNTER — Encounter: Payer: Self-pay | Admitting: Pulmonary Disease

## 2010-01-11 LAB — CBC WITH DIFFERENTIAL/PLATELET
BASO%: 0.1 % (ref 0.0–2.0)
EOS%: 0.1 % (ref 0.0–7.0)
HGB: 13.6 g/dL (ref 13.0–17.1)
MCH: 28.4 pg (ref 27.2–33.4)
MCHC: 33.2 g/dL (ref 32.0–36.0)
RBC: 4.81 10*6/uL (ref 4.20–5.82)
RDW: 16 % — ABNORMAL HIGH (ref 11.0–14.6)
lymph#: 0.6 10*3/uL — ABNORMAL LOW (ref 0.9–3.3)

## 2010-01-11 LAB — COMPREHENSIVE METABOLIC PANEL
AST: 20 U/L (ref 0–37)
Albumin: 3.8 g/dL (ref 3.5–5.2)
Alkaline Phosphatase: 80 U/L (ref 39–117)
Potassium: 4.5 mEq/L (ref 3.5–5.3)
Sodium: 138 mEq/L (ref 135–145)
Total Protein: 6.1 g/dL (ref 6.0–8.3)

## 2010-01-11 LAB — LACTATE DEHYDROGENASE: LDH: 149 U/L (ref 94–250)

## 2010-01-21 ENCOUNTER — Ambulatory Visit: Payer: Self-pay | Admitting: Oncology

## 2010-01-22 ENCOUNTER — Encounter: Payer: Self-pay | Admitting: Pulmonary Disease

## 2010-01-22 LAB — CBC WITH DIFFERENTIAL/PLATELET
Basophils Absolute: 0 10*3/uL (ref 0.0–0.1)
EOS%: 0.1 % (ref 0.0–7.0)
HGB: 14.2 g/dL (ref 13.0–17.1)
LYMPH%: 6.8 % — ABNORMAL LOW (ref 14.0–49.0)
MCH: 28.2 pg (ref 27.2–33.4)
MCV: 85.6 fL (ref 79.3–98.0)
MONO%: 3.1 % (ref 0.0–14.0)
Platelets: 218 10*3/uL (ref 140–400)
RBC: 5.03 10*6/uL (ref 4.20–5.82)
RDW: 17.8 % — ABNORMAL HIGH (ref 11.0–14.6)

## 2010-01-22 LAB — COMPREHENSIVE METABOLIC PANEL
AST: 21 U/L (ref 0–37)
Albumin: 4.1 g/dL (ref 3.5–5.2)
Alkaline Phosphatase: 72 U/L (ref 39–117)
BUN: 28 mg/dL — ABNORMAL HIGH (ref 6–23)
Potassium: 5 mEq/L (ref 3.5–5.3)
Total Bilirubin: 0.7 mg/dL (ref 0.3–1.2)

## 2010-02-05 DIAGNOSIS — E785 Hyperlipidemia, unspecified: Secondary | ICD-10-CM | POA: Insufficient documentation

## 2010-02-05 DIAGNOSIS — C3490 Malignant neoplasm of unspecified part of unspecified bronchus or lung: Secondary | ICD-10-CM | POA: Insufficient documentation

## 2010-02-05 DIAGNOSIS — Z85820 Personal history of malignant melanoma of skin: Secondary | ICD-10-CM | POA: Insufficient documentation

## 2010-02-06 ENCOUNTER — Ambulatory Visit: Payer: Self-pay | Admitting: Pulmonary Disease

## 2010-02-06 DIAGNOSIS — C3431 Malignant neoplasm of lower lobe, right bronchus or lung: Secondary | ICD-10-CM | POA: Insufficient documentation

## 2010-02-06 DIAGNOSIS — R0602 Shortness of breath: Secondary | ICD-10-CM | POA: Insufficient documentation

## 2010-02-13 ENCOUNTER — Ambulatory Visit: Payer: Self-pay | Admitting: Cardiology

## 2010-02-14 ENCOUNTER — Encounter: Payer: Self-pay | Admitting: Pulmonary Disease

## 2010-02-14 LAB — CBC WITH DIFFERENTIAL/PLATELET
Basophils Absolute: 0 10*3/uL (ref 0.0–0.1)
EOS%: 0.3 % (ref 0.0–7.0)
Eosinophils Absolute: 0 10*3/uL (ref 0.0–0.5)
HGB: 13.7 g/dL (ref 13.0–17.1)
NEUT#: 5.5 10*3/uL (ref 1.5–6.5)
RBC: 4.68 10*6/uL (ref 4.20–5.82)
RDW: 19.4 % — ABNORMAL HIGH (ref 11.0–14.6)
lymph#: 0.5 10*3/uL — ABNORMAL LOW (ref 0.9–3.3)

## 2010-02-14 LAB — COMPREHENSIVE METABOLIC PANEL
ALT: 15 U/L (ref 0–53)
BUN: 22 mg/dL (ref 6–23)
CO2: 27 mEq/L (ref 19–32)
Calcium: 9.2 mg/dL (ref 8.4–10.5)
Chloride: 110 mEq/L (ref 96–112)
Creatinine, Ser: 0.98 mg/dL (ref 0.40–1.50)
Glucose, Bld: 118 mg/dL — ABNORMAL HIGH (ref 70–99)
Total Bilirubin: 0.4 mg/dL (ref 0.3–1.2)

## 2010-02-14 LAB — LACTATE DEHYDROGENASE: LDH: 181 U/L (ref 94–250)

## 2010-02-22 ENCOUNTER — Ambulatory Visit: Payer: Self-pay | Admitting: Cardiology

## 2010-02-22 ENCOUNTER — Ambulatory Visit (HOSPITAL_COMMUNITY): Admission: RE | Admit: 2010-02-22 | Discharge: 2010-02-22 | Payer: Self-pay | Admitting: Cardiology

## 2010-02-22 ENCOUNTER — Encounter: Payer: Self-pay | Admitting: Cardiology

## 2010-02-22 ENCOUNTER — Ambulatory Visit: Payer: Self-pay

## 2010-02-22 HISTORY — PX: TRANSTHORACIC ECHOCARDIOGRAM: SHX275

## 2010-02-26 ENCOUNTER — Ambulatory Visit: Payer: Self-pay | Admitting: Pulmonary Disease

## 2010-03-04 ENCOUNTER — Ambulatory Visit: Payer: Self-pay | Admitting: Pulmonary Disease

## 2010-03-04 DIAGNOSIS — J439 Emphysema, unspecified: Secondary | ICD-10-CM

## 2010-03-22 ENCOUNTER — Ambulatory Visit: Payer: Self-pay | Admitting: Oncology

## 2010-03-25 ENCOUNTER — Encounter: Payer: Self-pay | Admitting: Pulmonary Disease

## 2010-03-25 LAB — CBC WITH DIFFERENTIAL/PLATELET
Basophils Absolute: 0 10*3/uL (ref 0.0–0.1)
Eosinophils Absolute: 0.1 10*3/uL (ref 0.0–0.5)
HCT: 42.8 % (ref 38.4–49.9)
HGB: 14.5 g/dL (ref 13.0–17.1)
MCH: 30.5 pg (ref 27.2–33.4)
MONO#: 0.5 10*3/uL (ref 0.1–0.9)
NEUT#: 3.4 10*3/uL (ref 1.5–6.5)
NEUT%: 71 % (ref 39.0–75.0)
WBC: 4.9 10*3/uL (ref 4.0–10.3)
lymph#: 0.8 10*3/uL — ABNORMAL LOW (ref 0.9–3.3)

## 2010-03-25 LAB — COMPREHENSIVE METABOLIC PANEL
Albumin: 4 g/dL (ref 3.5–5.2)
BUN: 17 mg/dL (ref 6–23)
CO2: 24 mEq/L (ref 19–32)
Calcium: 9.2 mg/dL (ref 8.4–10.5)
Chloride: 109 mEq/L (ref 96–112)
Creatinine, Ser: 1.28 mg/dL (ref 0.40–1.50)
Glucose, Bld: 105 mg/dL — ABNORMAL HIGH (ref 70–99)

## 2010-03-25 LAB — LACTATE DEHYDROGENASE: LDH: 179 U/L (ref 94–250)

## 2010-04-16 ENCOUNTER — Ambulatory Visit (HOSPITAL_COMMUNITY)
Admission: RE | Admit: 2010-04-16 | Discharge: 2010-04-16 | Payer: Self-pay | Source: Home / Self Care | Attending: Oncology | Admitting: Oncology

## 2010-04-16 LAB — LACTATE DEHYDROGENASE: LDH: 171 U/L (ref 94–250)

## 2010-04-16 LAB — COMPREHENSIVE METABOLIC PANEL
ALT: 14 U/L (ref 0–53)
AST: 21 U/L (ref 0–37)
Albumin: 4.3 g/dL (ref 3.5–5.2)
Alkaline Phosphatase: 63 U/L (ref 39–117)
BUN: 16 mg/dL (ref 6–23)
CO2: 28 mEq/L (ref 19–32)
Calcium: 9.7 mg/dL (ref 8.4–10.5)
Chloride: 105 mEq/L (ref 96–112)
Creatinine, Ser: 0.99 mg/dL (ref 0.40–1.50)
Glucose, Bld: 97 mg/dL (ref 70–99)
Potassium: 4.8 mEq/L (ref 3.5–5.3)
Sodium: 141 mEq/L (ref 135–145)
Total Bilirubin: 0.6 mg/dL (ref 0.3–1.2)
Total Protein: 6.7 g/dL (ref 6.0–8.3)

## 2010-04-16 LAB — CBC WITH DIFFERENTIAL/PLATELET
BASO%: 0.6 % (ref 0.0–2.0)
Basophils Absolute: 0 10*3/uL (ref 0.0–0.1)
EOS%: 3.2 % (ref 0.0–7.0)
Eosinophils Absolute: 0.2 10*3/uL (ref 0.0–0.5)
HCT: 46 % (ref 38.4–49.9)
HGB: 15.4 g/dL (ref 13.0–17.1)
LYMPH%: 19 % (ref 14.0–49.0)
MCH: 30.2 pg (ref 27.2–33.4)
MCHC: 33.5 g/dL (ref 32.0–36.0)
MCV: 90.2 fL (ref 79.3–98.0)
MONO#: 0.7 10*3/uL (ref 0.1–0.9)
MONO%: 12.2 % (ref 0.0–14.0)
NEUT#: 3.5 10*3/uL (ref 1.5–6.5)
NEUT%: 65 % (ref 39.0–75.0)
Platelets: 234 10*3/uL (ref 140–400)
RBC: 5.1 10*6/uL (ref 4.20–5.82)
RDW: 14.7 % — ABNORMAL HIGH (ref 11.0–14.6)
WBC: 5.4 10*3/uL (ref 4.0–10.3)
lymph#: 1 10*3/uL (ref 0.9–3.3)

## 2010-04-27 ENCOUNTER — Other Ambulatory Visit: Payer: Self-pay | Admitting: Radiation Oncology

## 2010-04-27 DIAGNOSIS — C3490 Malignant neoplasm of unspecified part of unspecified bronchus or lung: Secondary | ICD-10-CM

## 2010-05-07 ENCOUNTER — Ambulatory Visit: Payer: Self-pay | Admitting: Cardiology

## 2010-05-07 NOTE — Assessment & Plan Note (Signed)
Summary: rov for review of pfts   Visit Type:  Follow-up Copy to:  Kimberlee Nearing MD Primary Provider/Referring Provider:  Marden Noble MD  CC:  follow up. pt here to discuss pft results. pt states his breathing is unchanged. pt c/o dry cough. pt quit in 1997.Marland Kitchen  History of Present Illness: the pt comes in today for f/u of his recent pfts, ordered as part of a w/u for doe.  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  His pfts show moderate airflow obstruction, mild restriction, and a mild reduction in his DLCO.  I have reviewed the studies in detail with him, and answered all of his questions.    Current Medications (verified): 1)  Budeprion Sr 150 Mg Xr12h-Tab (Bupropion Hcl) .... One Tablet Once Daily 2)  Pantoprazole Sodium 40 Mg Tbec (Pantoprazole Sodium) .... One Tablet Two Times A Day 3)  Metoprolol Tartrate 50 Mg Tabs (Metoprolol Tartrate) .... 1/2 Tablet Two Times A Day 4)  Plavix 75 Mg Tabs (Clopidogrel Bisulfate) .... Once Daily 5)  Prednisone 10 Mg Tabs (Prednisone) .... Once Daily 6)  Tamsulosin Hcl 0.4 Mg Caps (Tamsulosin Hcl) .... Once Daily  Allergies (verified): 1)  ! Zoloft  Review of Systems       The patient complains of shortness of breath with activity and non-productive cough.  The patient denies shortness of breath at rest, productive cough, coughing up blood, chest pain, irregular heartbeats, acid heartburn, indigestion, loss of appetite, weight change, abdominal pain, difficulty swallowing, sore throat, tooth/dental problems, headaches, nasal congestion/difficulty breathing through nose, sneezing, itching, ear ache, anxiety, depression, hand/feet swelling, joint stiffness or pain, rash, change in color of mucus, and fever.    Vital Signs:  Patient profile:   76 year old male Height:      69 inches Weight:      207 pounds BMI:     30.68 O2 Sat:      96 % on Room air Temp:     97.9 degrees F  oral Pulse rate:   59 / minute BP sitting:   120 / 76  (left arm) Cuff size:   large  Vitals Entered By: Carver Fila (March 04, 2010 10:39 AM)  O2 Flow:  Room air CC: follow up. pt here to discuss pft results. pt states his breathing is unchanged. pt c/o dry cough. pt quit in 1997. Comments meds and allergies updated Phone number updated Carver Fila  March 04, 2010 10:39 AM    Physical Exam  General:  wd male in nad Nose:  no purulence or discharge Lungs:  large airway wheeze in LUL with transmission to other airways.  No true wheezing noting. adequate airflow bilat. Heart:  rrr Extremities:  no edema or cyanosis  Neurologic:  alert and oriented, moves all 4.   Impression & Recommendations:  Problem # 1:  EMPHYSEMA (ICD-492.8) the pt has moderate airflow obstruction by his recent pfts, and will benefit from an aggressive bronchodilator regimen.  Will start on spiriva and foradil initially, and see how he responds.  Problem # 2:  DYSPNEA (ICD-786.05) multifactorial, and related to his emphysema, xrt, deconditioning, and possibly his cardiac issues.  I have asked him to work on modest weight loss, as well as some type of conditioning program.  Other Orders: Est. Patient Level III (16109)  Patient Instructions: 1)  will start spiriva one inhalation each am, as well as foradil one inhalation  in am and pm.  Please call me in 3-4 weeks to let me know if helps.  I can call in prescriptions at that time. 2)  proair for emergencies only...2 inhal. up to every 6hrs if needed. 3)  followup with me in 3mos.

## 2010-05-07 NOTE — Miscellaneous (Signed)
Summary: Orders Update pft charges  Clinical Lists Changes  Orders: Added new Service order of Carbon Monoxide diffusing w/capacity (94720) - Signed Added new Service order of Lung Volumes (94240) - Signed Added new Service order of Spirometry (Pre & Post) (94060) - Signed 

## 2010-05-07 NOTE — Letter (Signed)
Summary: [facility]  [facility]   Imported By: Sherian Rein 02/25/2010 07:59:28  _____________________________________________________________________  External Attachment:    Type:   Image     Comment:   External Document

## 2010-05-07 NOTE — Letter (Signed)
Summary: [facility]  [facility]   Imported By: Sherian Rein 02/25/2010 14:16:35  _____________________________________________________________________  External Attachment:    Type:   Image     Comment:   External Document

## 2010-05-07 NOTE — Assessment & Plan Note (Signed)
Summary: consult for dyspnea   Visit Type:  Initial Consult Copy to:  Kimberlee Nearing MD Primary Provider/Referring Provider:  Marden Noble MD  CC:  pulmonary consult. pt c/o he gets real sob with activity and at rest and deep cough.  History of Present Illness: The pt is a 76y/o male who I have been asked to see for dyspnea.  He has a h/o 3B squamous cell carcinoma LUL, and is s/p chemo and xrt earlier in the year.   He has noted doe since may of this year, and recently underwent a ct chest for evaluation.  He did not have a PE, but did have an increase in his soft tissue density in the LUL, and also patchy GG densities bilat.  He subsequently underwent a PET scan which showed no increased FDG uptake in the LUL, and only mild uptake in the GG densities.  Since the pt had just finished xrt in the spring, the GG densities were felt most c/w radiation pneumonitis.  He was started on a course of steroids, and it is slowly being tapered.  He has seen a significant improvment, and is at least 50% better.  He states that he was walking one hour a day before this, and now can walk about at his usual moderate pace.  He gets very winded walking up stairs.  He has a mild cough, but no mucus.  He has not had any LE edema.  He does have a h/o CAD that is followed by Dr Deborah Chalk, and echo this fall showed normal LV fxn, no evidence for pulmonary htn, and the presence of a pericardial effusion with the description "there was chamber collapse".  He is s/p pericardial window 10/2009.    Current Medications (verified): 1)  Budeprion Sr 150 Mg Xr12h-Tab (Bupropion Hcl) .... One Tablet Once Daily 2)  Pantoprazole Sodium 40 Mg Tbec (Pantoprazole Sodium) .... One Tablet Two Times A Day 3)  Metoprolol Tartrate 50 Mg Tabs (Metoprolol Tartrate) .... 1/2 Tablet Two Times A Day 4)  Plavix 75 Mg Tabs (Clopidogrel Bisulfate) .... Once Daily 5)  Prednisone 10 Mg Tabs (Prednisone) .... Once Daily 6)  Tamsulosin Hcl 0.4 Mg  Caps (Tamsulosin Hcl) .... Once Daily  Allergies (verified): 1)  ! Zoloft  Past History:  Past Medical History: h/o pericardial effusion--s/p window 10/2009 Hx of MYOCARDIAL INFARCTION (ICD-410.90)--s/p PCI, stent LUNG CANCER --stage IIIB squamous cell LUL...s/p xrt and chemo. MELANOMA, HX OF (ICD-V10.82) HYPERLIPIDEMIA (ICD-272.4)    Past Surgical History: s/p pericardial window 10/2009 2 stent placements hernia repair  Family History: Reviewed history and no changes required. lung cancer--father  Social History: Reviewed history from 02/05/2010 and no changes required. Patient states former smoker. quit 1997.  3 ppd. started age 42.  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  The patient denies coughing up blood, chest pain, irregular heartbeats, acid heartburn, indigestion, weight change, abdominal pain, difficulty swallowing, sore throat, tooth/dental problems, headaches, nasal congestion/difficulty breathing through nose, sneezing, itching, ear ache, anxiety, depression, hand/feet swelling, joint stiffness or pain, rash, change in color of mucus, and fever.    Vital Signs:  Patient profile:   76 year old male Height:      69 inches Weight:      200 pounds BMI:     29.64 O2 Sat:      98 % on  Room air Temp:     97.6 degrees F oral Pulse rate:   65 / minute BP sitting:   110 / 70  (left arm) Cuff size:   large  Vitals Entered By: Carver Fila (February 06, 2010 8:59 AM)  O2 Flow:  Room air CC: pulmonary consult. pt c/o he gets real sob with activity and at rest, deep cough Comments meds and allergies updated Phone number updated Carver Fila  February 06, 2010 8:59 AM    Physical Exam  General:  wd male in nad Eyes:  PERRLA and EOMI.   Nose:  mild narrowing on left, but patent no purulence or discharge Mouth:  clear, no  exudates. Neck:  no jvd, tmg, LN Lungs:  a few crackles and rhonchi in left upper lung zone anteriorly, o/w clear Heart:  rrr, 2/6 sem Abdomen:  soft and nontender, bs+ Extremities:  no edema or cyanosis  pulses intact distally Neurologic:  alert and oriented, moves all 4.   Impression & Recommendations:  Problem # 1:  DYSPNEA (ICD-786.05) The pt has had significant doe that has greatly improved with prednisone treatment.  I have reviewed his ct scan, and this is certainly compatible with xrt pneumonitis given his history.  I agree with a slow taper of his prednisone over time.  What is not known is whether he may have other issues which contribute to his sob.  He has a long h/o smoking, and has not had pfts in the past.  Would like to check these to see if he has superimposed obstructive lung disease which may contribute to his symptoms.  He also has a h/o a pericardial effusion with window placement this summer by old records.  His echo in sept though shows "chamber collapse" ?  This may need to be clarified.  Problem # 2:  MALIGNANT NEOPLASM UPPER LOBE BRONCHUS OR LUNG (ICD-162.3) the pt has a h/o IIIB squamous cell cancer LUL, but most recent PET was encouraging  Medications Added to Medication List This Visit: 1)  Budeprion Sr 150 Mg Xr12h-tab (Bupropion hcl) .... One tablet once daily 2)  Pantoprazole Sodium 40 Mg Tbec (Pantoprazole sodium) .... One tablet two times a day 3)  Metoprolol Tartrate 50 Mg Tabs (Metoprolol tartrate) .... 1/2 tablet two times a day 4)  Plavix 75 Mg Tabs (Clopidogrel bisulfate) .... Once daily 5)  Prednisone 10 Mg Tabs (Prednisone) .... Once daily 6)  Tamsulosin Hcl 0.4 Mg Caps (Tamsulosin hcl) .... Once daily  Other Orders: Consultation Level V (11914) Pulmonary Referral (Pulmonary)  Patient Instructions: 1)  stay on current prednisone dose 2)  will schedule for breathing studies, and see you back on same day to review.   Immunization  History:  Influenza Immunization History:    Influenza:  historical (01/05/2010)  Pneumovax Immunization History:    Pneumovax:  historical (01/05/2010)   Appended Document: consult for dyspnea pt needs ov to review recent pfts.  please arrange soon.  Appended Document: consult for dyspnea lmomtcb x1  Appended Document: consult for dyspnea pt already has an apt scheduled for 11/28

## 2010-05-20 ENCOUNTER — Other Ambulatory Visit (HOSPITAL_COMMUNITY): Payer: Self-pay | Admitting: Oncology

## 2010-05-20 ENCOUNTER — Encounter (HOSPITAL_BASED_OUTPATIENT_CLINIC_OR_DEPARTMENT_OTHER): Payer: Medicare Other | Admitting: Oncology

## 2010-05-20 DIAGNOSIS — C3410 Malignant neoplasm of upper lobe, unspecified bronchus or lung: Secondary | ICD-10-CM

## 2010-05-20 DIAGNOSIS — R0609 Other forms of dyspnea: Secondary | ICD-10-CM

## 2010-05-20 LAB — COMPREHENSIVE METABOLIC PANEL
Alkaline Phosphatase: 57 U/L (ref 39–117)
BUN: 19 mg/dL (ref 6–23)
CO2: 28 mEq/L (ref 19–32)
Creatinine, Ser: 1.13 mg/dL (ref 0.40–1.50)
Glucose, Bld: 91 mg/dL (ref 70–99)
Total Bilirubin: 0.7 mg/dL (ref 0.3–1.2)
Total Protein: 6.3 g/dL (ref 6.0–8.3)

## 2010-05-20 LAB — CBC WITH DIFFERENTIAL/PLATELET
Basophils Absolute: 0 10*3/uL (ref 0.0–0.1)
Eosinophils Absolute: 0.1 10*3/uL (ref 0.0–0.5)
HCT: 45.5 % (ref 38.4–49.9)
HGB: 15.1 g/dL (ref 13.0–17.1)
LYMPH%: 18.6 % (ref 14.0–49.0)
MCV: 91.5 fL (ref 79.3–98.0)
MONO#: 0.5 10*3/uL (ref 0.1–0.9)
MONO%: 10 % (ref 0.0–14.0)
NEUT#: 3.5 10*3/uL (ref 1.5–6.5)
NEUT%: 69.1 % (ref 39.0–75.0)
Platelets: 206 10*3/uL (ref 140–400)
RBC: 4.97 10*6/uL (ref 4.20–5.82)
WBC: 5.1 10*3/uL (ref 4.0–10.3)

## 2010-05-20 LAB — LACTATE DEHYDROGENASE: LDH: 156 U/L (ref 94–250)

## 2010-05-30 ENCOUNTER — Ambulatory Visit: Payer: Medicare Other | Attending: Radiation Oncology | Admitting: Radiation Oncology

## 2010-06-03 ENCOUNTER — Encounter: Payer: Self-pay | Admitting: Pulmonary Disease

## 2010-06-03 ENCOUNTER — Ambulatory Visit (INDEPENDENT_AMBULATORY_CARE_PROVIDER_SITE_OTHER): Payer: Medicare Other | Admitting: Pulmonary Disease

## 2010-06-03 ENCOUNTER — Other Ambulatory Visit (HOSPITAL_COMMUNITY): Payer: Self-pay

## 2010-06-03 DIAGNOSIS — J438 Other emphysema: Secondary | ICD-10-CM

## 2010-06-13 NOTE — Assessment & Plan Note (Signed)
Summary: rov for moderate emphysema   Copy to:  Kimberlee Nearing MD Primary Provider/Referring Provider:  Marden Noble MD  CC:  3 month f/u appt.  Pt states breathing is "so so" while on Spiriva and Fordail but states it has improved since his last visit.  Pt also c/o non-productive cough. .  History of Present Illness: the pt comes in today for f/u of his known moderate emphysema.  He also has IIIb NSCLC, and has been receiving xrt complicated by probable pneumonitis.  He is being treated with prednisone for this, and has been tapered to a low dose currently.  He has been maintaining on his inhaler regimen, and feels his exertional tolerance is at baseline.  His main complaint is that generalized weakness.  He denies any recent congestion or purulence.    Current Medications (verified): 1)  Budeprion Sr 150 Mg Xr12h-Tab (Bupropion Hcl) .... One Tablet Once Daily 2)  Pantoprazole Sodium 40 Mg Tbec (Pantoprazole Sodium) .... One Tablet Two Times A Day 3)  Metoprolol Tartrate 50 Mg Tabs (Metoprolol Tartrate) .... 1/2 Tablet Two Times A Day 4)  Plavix 75 Mg Tabs (Clopidogrel Bisulfate) .... Once Daily 5)  Prednisone 5 Mg Tabs (Prednisone) .... Take 1 Tab By Mouth Every Other Day 6)  Tamsulosin Hcl 0.4 Mg Caps (Tamsulosin Hcl) .... Once Daily 7)  Proair Hfa 108 (90 Base) Mcg/act  Aers (Albuterol Sulfate) .Marland Kitchen.. 1-2 Puffs Every 4-6 Hours As Needed 8)  Spiriva Handihaler 18 Mcg  Caps (Tiotropium Bromide Monohydrate) .... Inhale Contents of 1 Capsule Once A Day 9)  Foradil Aerolizer 12 Mcg  Caps (Formoterol Fumarate) .... One  Capsule (1 Puff) Twice Daily  Allergies (verified): 1)  ! Zoloft  Past History:  Past Medical History: h/o pericardial effusion--s/p window 10/2009 Hx of MYOCARDIAL INFARCTION (ICD-410.90)--s/p PCI, stent LUNG CANCER --stage IIIB squamous cell LUL...s/p xrt and chemo. MELANOMA, HX OF (ICD-V10.82) HYPERLIPIDEMIA (ICD-272.4) COPD--moderate by pfts 2011.  FEV1 1.80L      Review of Systems       The patient complains of shortness of breath with activity, shortness of breath at rest, and non-productive cough.  The patient denies productive cough, coughing up blood, chest pain, irregular heartbeats, acid heartburn, indigestion, loss of appetite, weight change, abdominal pain, difficulty swallowing, sore throat, tooth/dental problems, headaches, nasal congestion/difficulty breathing through nose, sneezing, itching, ear ache, anxiety, depression, hand/feet swelling, joint stiffness or pain, rash, change in color of mucus, and fever.    Vital Signs:  Patient profile:   76 year old male Height:      69 inches Weight:      207.25 pounds O2 Sat:      97 % on Room air Temp:     97.5 degrees F oral Pulse rate:   84 / minute BP sitting:   112 / 68  (left arm) Cuff size:   large  Vitals Entered By: Arman Filter LPN (June 03, 2010 10:23 AM)  O2 Flow:  Room air CC: 3 month f/u appt.  Pt states breathing is "so so" while on Spiriva and Fordail but states it has improved since his last visit.  Pt also c/o non-productive cough.  Comments Medications reviewed with patient Arman Filter LPN  June 03, 2010 10:23 AM    Physical Exam  General:  ow  male in nad Lungs:  a few rhonchi, no wheezing good airflow bilat. Heart:  rrr Extremities:  no edema or cyanosis Neurologic:  alert and oriented, moves all 4  Impression & Recommendations:  Problem # 1:  EMPHYSEMA (ICD-492.8)  the pt has known moderate emphysema, but feels he is maintaining near his usual baseline wrt exertional tolerance.  He c/o generalized weakness more than anything else, and this is not unusual considering everything he has been through with treatment of his cancer.  I will try him on symbicort in the place of his foradil to see if he sees a difference in his breathing.  He is to let me know.    Medications Added to Medication List This Visit: 1)  Prednisone 5 Mg Tabs (Prednisone)  .... Take 1 tab by mouth every other day 2)  Proair Hfa 108 (90 Base) Mcg/act Aers (Albuterol sulfate) .Marland Kitchen.. 1-2 puffs every 4-6 hours as needed 3)  Spiriva Handihaler 18 Mcg Caps (Tiotropium bromide monohydrate) .... Inhale contents of 1 capsule once a day 4)  Foradil Aerolizer 12 Mcg Caps (Formoterol fumarate) .... One  capsule (1 puff) twice daily  Other Orders: Est. Patient Level III (95621)  Patient Instructions: 1)  will try symbicort in the place of foradil for the next 4 weeks...2 inhalations am and pm.  Rinse mouth well after use. 2)  stay on spiriva 3)  stay as active as possible. 4)  followup with me in 4mos, but let me know how things go with the medication change.

## 2010-06-18 NOTE — Letter (Signed)
Summary: [facility]  [facility]   Imported By: Lester Glenwood 04/05/2010 11:56:26  _____________________________________________________________________  External Attachment:    Type:   Image     Comment:   External Document

## 2010-06-20 LAB — GLUCOSE, CAPILLARY: Glucose-Capillary: 114 mg/dL — ABNORMAL HIGH (ref 70–99)

## 2010-06-22 LAB — BASIC METABOLIC PANEL
BUN: 11 mg/dL (ref 6–23)
BUN: 14 mg/dL (ref 6–23)
BUN: 16 mg/dL (ref 6–23)
CO2: 27 mEq/L (ref 19–32)
CO2: 28 mEq/L (ref 19–32)
CO2: 29 mEq/L (ref 19–32)
Calcium: 8.6 mg/dL (ref 8.4–10.5)
Calcium: 8.8 mg/dL (ref 8.4–10.5)
Calcium: 9 mg/dL (ref 8.4–10.5)
Chloride: 101 mEq/L (ref 96–112)
Chloride: 99 mEq/L (ref 96–112)
Chloride: 99 mEq/L (ref 96–112)
Creatinine, Ser: 0.73 mg/dL (ref 0.4–1.5)
Creatinine, Ser: 0.75 mg/dL (ref 0.4–1.5)
Creatinine, Ser: 0.76 mg/dL (ref 0.4–1.5)
GFR calc Af Amer: >60 mL/min (ref >60)
GFR calc Af Amer: >60 mL/min (ref >60)
GFR calc Af Amer: >60 mL/min (ref >60)
GFR calc non Af Amer: >60 mL/min (ref >60)
GFR calc non Af Amer: >60 mL/min (ref >60)
GFR calc non Af Amer: >60 mL/min (ref >60)
Glucose, Bld: 102 mg/dL — ABNORMAL HIGH (ref 70–99)
Glucose, Bld: 133 mg/dL — ABNORMAL HIGH (ref 70–99)
Glucose, Bld: 187 mg/dL — ABNORMAL HIGH (ref 70–99)
Potassium: 3.6 mEq/L (ref 3.5–5.1)
Potassium: 3.8 mEq/L (ref 3.5–5.1)
Potassium: 4.2 mEq/L (ref 3.5–5.1)
Sodium: 136 mEq/L (ref 135–145)
Sodium: 136 mEq/L (ref 135–145)
Sodium: 137 mEq/L (ref 135–145)

## 2010-06-22 LAB — URINALYSIS, MICROSCOPIC ONLY
Bilirubin Urine: NEGATIVE
Glucose, UA: NEGATIVE mg/dL
Hgb urine dipstick: NEGATIVE
Ketones, ur: NEGATIVE mg/dL
Leukocytes, UA: NEGATIVE
Nitrite: NEGATIVE
Protein, ur: NEGATIVE mg/dL
Specific Gravity, Urine: 1.014 (ref 1.005–1.030)
Urobilinogen, UA: 1 mg/dL (ref 0.0–1.0)
pH: 7.5 (ref 5.0–8.0)

## 2010-06-22 LAB — TISSUE CULTURE
Culture: NO GROWTH
Gram Stain: NONE SEEN

## 2010-06-22 LAB — CBC
HCT: 38 % — ABNORMAL LOW (ref 39.0–52.0)
HCT: 38.3 % — ABNORMAL LOW (ref 39.0–52.0)
HCT: 39.2 % (ref 39.0–52.0)
Hemoglobin: 12.3 g/dL — ABNORMAL LOW (ref 13.0–17.0)
Hemoglobin: 12.6 g/dL — ABNORMAL LOW (ref 13.0–17.0)
Hemoglobin: 12.7 g/dL — ABNORMAL LOW (ref 13.0–17.0)
MCH: 29.7 pg (ref 26.0–34.0)
MCH: 29.9 pg (ref 26.0–34.0)
MCH: 31.1 pg (ref 26.0–34.0)
MCHC: 32.1 g/dL (ref 30.0–36.0)
MCHC: 32.3 g/dL (ref 30.0–36.0)
MCHC: 33.5 g/dL (ref 30.0–36.0)
MCV: 92.6 fL (ref 78.0–100.0)
MCV: 92.7 fL (ref 78.0–100.0)
MCV: 92.9 fL (ref 78.0–100.0)
Platelets: 204 10*3/uL (ref 150–400)
Platelets: 216 10*3/uL (ref 150–400)
Platelets: 228 10*3/uL (ref 150–400)
RBC: 4.09 MIL/uL — ABNORMAL LOW (ref 4.22–5.81)
RBC: 4.13 MIL/uL — ABNORMAL LOW (ref 4.22–5.81)
RBC: 4.23 MIL/uL (ref 4.22–5.81)
RDW: 15.4 % (ref 11.5–15.5)
RDW: 15.7 % — ABNORMAL HIGH (ref 11.5–15.5)
RDW: 16.2 % — ABNORMAL HIGH (ref 11.5–15.5)
WBC: 5.4 10*3/uL (ref 4.0–10.5)
WBC: 5.6 10*3/uL (ref 4.0–10.5)
WBC: 8.6 10*3/uL (ref 4.0–10.5)

## 2010-06-22 LAB — MRSA PCR SCREENING: MRSA by PCR: NEGATIVE

## 2010-06-22 LAB — BLOOD GAS, ARTERIAL
Acid-Base Excess: 1.6 mmol/L (ref 0.0–2.0)
Acid-base deficit: 1.6 mmol/L (ref 0.0–2.0)
Bicarbonate: 24.5 mEq/L — ABNORMAL HIGH (ref 20.0–24.0)
Bicarbonate: 26.2 mEq/L — ABNORMAL HIGH (ref 20.0–24.0)
Drawn by: 317771
O2 Content: 2 L/min
O2 Content: 2 L/min
O2 Saturation: 92.8 %
O2 Saturation: 95.3 %
Patient temperature: 98.6
Patient temperature: 98.6
TCO2: 26.2 mmol/L (ref 0–100)
TCO2: 27.6 mmol/L (ref 0–100)
pCO2 arterial: 45.2 mmHg — ABNORMAL HIGH (ref 35.0–45.0)
pCO2 arterial: 55.6 mmHg — ABNORMAL HIGH (ref 35.0–45.0)
pH, Arterial: 7.266 — ABNORMAL LOW (ref 7.350–7.450)
pH, Arterial: 7.381 (ref 7.350–7.450)
pO2, Arterial: 75.1 mmHg — ABNORMAL LOW (ref 80.0–100.0)
pO2, Arterial: 77.7 mmHg — ABNORMAL LOW (ref 80.0–100.0)

## 2010-06-22 LAB — TYPE AND SCREEN
ABO/RH(D): A NEG
Antibody Screen: NEGATIVE

## 2010-06-22 LAB — FUNGUS CULTURE W SMEAR: Fungal Smear: NONE SEEN

## 2010-06-22 LAB — COMPREHENSIVE METABOLIC PANEL
ALT: 13 U/L (ref 0–53)
AST: 26 U/L (ref 0–37)
Albumin: 3.7 g/dL (ref 3.5–5.2)
Alkaline Phosphatase: 65 U/L (ref 39–117)
BUN: 17 mg/dL (ref 6–23)
CO2: 25 mEq/L (ref 19–32)
Calcium: 9 mg/dL (ref 8.4–10.5)
Chloride: 106 mEq/L (ref 96–112)
Creatinine, Ser: 1.16 mg/dL (ref 0.4–1.5)
GFR calc Af Amer: >60 mL/min (ref >60)
GFR calc non Af Amer: >60 mL/min (ref >60)
Glucose, Bld: 111 mg/dL — ABNORMAL HIGH (ref 70–99)
Potassium: 4.2 mEq/L (ref 3.5–5.1)
Sodium: 139 mEq/L (ref 135–145)
Total Bilirubin: 1 mg/dL (ref 0.3–1.2)
Total Protein: 6 g/dL (ref 6.0–8.3)

## 2010-06-22 LAB — ABO/RH: ABO/RH(D): A NEG

## 2010-06-22 LAB — APTT: aPTT: 26 seconds (ref 24–37)

## 2010-06-22 LAB — AFB CULTURE WITH SMEAR (NOT AT ARMC): Acid Fast Smear: NONE SEEN

## 2010-06-22 LAB — BODY FLUID CULTURE
Culture: NO GROWTH
Gram Stain: NONE SEEN

## 2010-06-22 LAB — PROTIME-INR
INR: 0.93 (ref 0.00–1.49)
Prothrombin Time: 12.4 seconds (ref 11.6–15.2)

## 2010-06-23 LAB — GLUCOSE, CAPILLARY: Glucose-Capillary: 97 mg/dL (ref 70–99)

## 2010-06-24 LAB — PHOSPHORUS: Phosphorus: 3.6 mg/dL (ref 2.3–4.6)

## 2010-06-24 LAB — CK TOTAL AND CKMB (NOT AT ARMC)
CK, MB: 2.5 ng/mL (ref 0.3–4.0)
Total CK: 33 U/L (ref 7–232)

## 2010-06-24 LAB — CBC
HCT: 29.8 % — ABNORMAL LOW (ref 39.0–52.0)
Hemoglobin: 11.5 g/dL — ABNORMAL LOW (ref 13.0–17.0)
Hemoglobin: 9.9 g/dL — ABNORMAL LOW (ref 13.0–17.0)
MCHC: 33.3 g/dL (ref 30.0–36.0)
MCHC: 33.3 g/dL (ref 30.0–36.0)
RBC: 3.69 MIL/uL — ABNORMAL LOW (ref 4.22–5.81)
RDW: 17 % — ABNORMAL HIGH (ref 11.5–15.5)
RDW: 17.1 % — ABNORMAL HIGH (ref 11.5–15.5)

## 2010-06-24 LAB — COMPREHENSIVE METABOLIC PANEL
ALT: 16 U/L (ref 0–53)
AST: 19 U/L (ref 0–37)
Alkaline Phosphatase: 130 U/L — ABNORMAL HIGH (ref 39–117)
BUN: 18 mg/dL (ref 6–23)
CO2: 26 mEq/L (ref 19–32)
Calcium: 8.8 mg/dL (ref 8.4–10.5)
Calcium: 9.3 mg/dL (ref 8.4–10.5)
GFR calc Af Amer: >60 mL/min (ref >60)
Glucose, Bld: 89 mg/dL (ref 70–99)
Glucose, Bld: 98 mg/dL (ref 70–99)
Potassium: 4.4 mEq/L (ref 3.5–5.1)
Sodium: 136 mEq/L (ref 135–145)
Sodium: 138 mEq/L (ref 135–145)
Total Protein: 5.7 g/dL — ABNORMAL LOW (ref 6.0–8.3)
Total Protein: 6.6 g/dL (ref 6.0–8.3)

## 2010-06-24 LAB — LIPASE, BLOOD: Lipase: 30 U/L (ref 11–59)

## 2010-06-24 LAB — CARDIAC PANEL(CRET KIN+CKTOT+MB+TROPI)
CK, MB: 3.5 ng/mL (ref 0.3–4.0)
Total CK: 30 U/L (ref 7–232)
Total CK: 34 U/L (ref 7–232)
Troponin I: 0.06 ng/mL (ref 0.00–0.06)

## 2010-06-24 LAB — URINALYSIS, ROUTINE W REFLEX MICROSCOPIC
Nitrite: NEGATIVE
Specific Gravity, Urine: 1.016 (ref 1.005–1.030)
Urobilinogen, UA: 1 mg/dL (ref 0.0–1.0)
pH: 6.5 (ref 5.0–8.0)

## 2010-06-24 LAB — POCT I-STAT, CHEM 8
Calcium, Ion: 1.1 mmol/L — ABNORMAL LOW (ref 1.12–1.32)
Creatinine, Ser: 0.8 mg/dL (ref 0.4–1.5)
Glucose, Bld: 98 mg/dL (ref 70–99)
HCT: 36 % — ABNORMAL LOW (ref 39.0–52.0)
Hemoglobin: 12.2 g/dL — ABNORMAL LOW (ref 13.0–17.0)
TCO2: 25 mmol/L (ref 0–100)

## 2010-06-24 LAB — DIFFERENTIAL
Basophils Relative: 1 % (ref 0–1)
Eosinophils Absolute: 0 10*3/uL (ref 0.0–0.7)
Eosinophils Relative: 0 % (ref 0–5)
Lymphs Abs: 0.8 10*3/uL (ref 0.7–4.0)
Lymphs Abs: 0.8 10*3/uL (ref 0.7–4.0)
Monocytes Relative: 1 % — ABNORMAL LOW (ref 3–12)
Monocytes Relative: 3 % (ref 3–12)
Neutro Abs: 5.9 10*3/uL (ref 1.7–7.7)
Neutrophils Relative %: 85 % — ABNORMAL HIGH (ref 43–77)
Neutrophils Relative %: 90 % — ABNORMAL HIGH (ref 43–77)

## 2010-06-24 LAB — TROPONIN I: Troponin I: 0.03 ng/mL (ref 0.00–0.06)

## 2010-07-01 ENCOUNTER — Other Ambulatory Visit (HOSPITAL_COMMUNITY): Payer: Self-pay | Admitting: Oncology

## 2010-07-01 ENCOUNTER — Encounter (HOSPITAL_BASED_OUTPATIENT_CLINIC_OR_DEPARTMENT_OTHER): Payer: Medicare Other | Admitting: Oncology

## 2010-07-01 DIAGNOSIS — R0989 Other specified symptoms and signs involving the circulatory and respiratory systems: Secondary | ICD-10-CM

## 2010-07-01 DIAGNOSIS — C3410 Malignant neoplasm of upper lobe, unspecified bronchus or lung: Secondary | ICD-10-CM

## 2010-07-01 DIAGNOSIS — C3490 Malignant neoplasm of unspecified part of unspecified bronchus or lung: Secondary | ICD-10-CM

## 2010-07-01 LAB — COMPREHENSIVE METABOLIC PANEL
ALT: 12 U/L (ref 0–53)
AST: 19 U/L (ref 0–37)
Alkaline Phosphatase: 70 U/L (ref 39–117)
Calcium: 9.8 mg/dL (ref 8.4–10.5)
Chloride: 106 mEq/L (ref 96–112)
Creatinine, Ser: 1.16 mg/dL (ref 0.40–1.50)

## 2010-07-01 LAB — CBC WITH DIFFERENTIAL/PLATELET
BASO%: 0.7 % (ref 0.0–2.0)
EOS%: 1.6 % (ref 0.0–7.0)
HCT: 48.8 % (ref 38.4–49.9)
MCH: 30.3 pg (ref 27.2–33.4)
MCHC: 33.2 g/dL (ref 32.0–36.0)
MONO%: 7.5 % (ref 0.0–14.0)
NEUT%: 76.4 % — ABNORMAL HIGH (ref 39.0–75.0)
RDW: 14.5 % (ref 11.0–14.6)
lymph#: 0.8 10*3/uL — ABNORMAL LOW (ref 0.9–3.3)

## 2010-07-10 LAB — CBC
HCT: 42.8 % (ref 39.0–52.0)
Hemoglobin: 14.5 g/dL (ref 13.0–17.0)
MCHC: 33.9 g/dL (ref 30.0–36.0)
MCV: 92.9 fL (ref 78.0–100.0)
RBC: 4.61 MIL/uL (ref 4.22–5.81)
RDW: 12.9 % (ref 11.5–15.5)

## 2010-07-10 LAB — BASIC METABOLIC PANEL
CO2: 29 mEq/L (ref 19–32)
Calcium: 9.3 mg/dL (ref 8.4–10.5)
Chloride: 109 mEq/L (ref 96–112)
Glucose, Bld: 92 mg/dL (ref 70–99)
Potassium: 4.2 mEq/L (ref 3.5–5.1)
Sodium: 143 mEq/L (ref 135–145)

## 2010-07-11 LAB — CBC
MCHC: 33.9 g/dL (ref 30.0–36.0)
MCV: 93.4 fL (ref 78.0–100.0)
RDW: 13 % (ref 11.5–15.5)

## 2010-07-11 LAB — PROTIME-INR
INR: 1.02 (ref 0.00–1.49)
Prothrombin Time: 13.3 seconds (ref 11.6–15.2)

## 2010-07-26 ENCOUNTER — Encounter: Payer: Self-pay | Admitting: Cardiology

## 2010-08-08 ENCOUNTER — Ambulatory Visit (HOSPITAL_COMMUNITY)
Admission: RE | Admit: 2010-08-08 | Discharge: 2010-08-08 | Disposition: A | Discharge status: Home / Self Care | Payer: Medicare Other | Source: Ambulatory Visit | Attending: Oncology | Admitting: Oncology

## 2010-08-08 ENCOUNTER — Encounter (HOSPITAL_COMMUNITY): Payer: Self-pay

## 2010-08-08 DIAGNOSIS — J449 Chronic obstructive pulmonary disease, unspecified: Secondary | ICD-10-CM | POA: Insufficient documentation

## 2010-08-08 DIAGNOSIS — Y842 Radiological procedure and radiotherapy as the cause of abnormal reaction of the patient, or of later complication, without mention of misadventure at the time of the procedure: Secondary | ICD-10-CM | POA: Insufficient documentation

## 2010-08-08 DIAGNOSIS — C3490 Malignant neoplasm of unspecified part of unspecified bronchus or lung: Secondary | ICD-10-CM | POA: Insufficient documentation

## 2010-08-08 DIAGNOSIS — J984 Other disorders of lung: Secondary | ICD-10-CM | POA: Insufficient documentation

## 2010-08-08 DIAGNOSIS — T66XXXS Radiation sickness, unspecified, sequela: Secondary | ICD-10-CM | POA: Insufficient documentation

## 2010-08-08 DIAGNOSIS — J4489 Other specified chronic obstructive pulmonary disease: Secondary | ICD-10-CM | POA: Insufficient documentation

## 2010-08-08 DIAGNOSIS — J701 Chronic and other pulmonary manifestations due to radiation: Secondary | ICD-10-CM | POA: Insufficient documentation

## 2010-08-08 DIAGNOSIS — K7689 Other specified diseases of liver: Secondary | ICD-10-CM | POA: Insufficient documentation

## 2010-08-08 DIAGNOSIS — J90 Pleural effusion, not elsewhere classified: Secondary | ICD-10-CM | POA: Insufficient documentation

## 2010-08-08 MED ORDER — IOHEXOL 300 MG/ML  SOLN
80.0000 mL | Freq: Once | INTRAMUSCULAR | Status: AC | PRN
  Administered 2010-08-08: 80 mL via INTRAVENOUS

## 2010-08-12 ENCOUNTER — Other Ambulatory Visit (HOSPITAL_COMMUNITY): Payer: Self-pay | Admitting: Oncology

## 2010-08-12 ENCOUNTER — Encounter (HOSPITAL_BASED_OUTPATIENT_CLINIC_OR_DEPARTMENT_OTHER): Payer: Medicare Other | Admitting: Oncology

## 2010-08-12 ENCOUNTER — Ambulatory Visit (HOSPITAL_COMMUNITY)
Admission: RE | Admit: 2010-08-12 | Discharge: 2010-08-12 | Disposition: A | Discharge status: Home / Self Care | Payer: Medicare Other | Source: Ambulatory Visit | Attending: Oncology | Admitting: Oncology

## 2010-08-12 DIAGNOSIS — R0609 Other forms of dyspnea: Secondary | ICD-10-CM

## 2010-08-12 DIAGNOSIS — C3490 Malignant neoplasm of unspecified part of unspecified bronchus or lung: Secondary | ICD-10-CM

## 2010-08-12 DIAGNOSIS — C3410 Malignant neoplasm of upper lobe, unspecified bronchus or lung: Secondary | ICD-10-CM

## 2010-08-12 DIAGNOSIS — J984 Other disorders of lung: Secondary | ICD-10-CM | POA: Insufficient documentation

## 2010-08-12 LAB — LACTATE DEHYDROGENASE: LDH: 159 U/L (ref 94–250)

## 2010-08-12 LAB — CBC WITH DIFFERENTIAL/PLATELET
BASO%: 0.8 % (ref 0.0–2.0)
Eosinophils Absolute: 0.1 10*3/uL (ref 0.0–0.5)
HCT: 44.7 % (ref 38.4–49.9)
LYMPH%: 18 % (ref 14.0–49.0)
MCHC: 33.5 g/dL (ref 32.0–36.0)
MCV: 90.8 fL (ref 79.3–98.0)
MONO#: 0.6 10*3/uL (ref 0.1–0.9)
MONO%: 9.6 % (ref 0.0–14.0)
NEUT%: 69.7 % (ref 39.0–75.0)
Platelets: 207 10*3/uL (ref 140–400)
RBC: 4.93 10*6/uL (ref 4.20–5.82)

## 2010-08-12 LAB — COMPREHENSIVE METABOLIC PANEL
Alkaline Phosphatase: 78 U/L (ref 39–117)
CO2: 24 mEq/L (ref 19–32)
Creatinine, Ser: 0.99 mg/dL (ref 0.40–1.50)
Glucose, Bld: 83 mg/dL (ref 70–99)
Sodium: 141 mEq/L (ref 135–145)
Total Bilirubin: 0.7 mg/dL (ref 0.3–1.2)
Total Protein: 6.7 g/dL (ref 6.0–8.3)

## 2010-08-12 LAB — CEA: CEA: 1.5 ng/mL (ref 0.0–5.0)

## 2010-08-20 NOTE — Assessment & Plan Note (Signed)
OFFICE VISIT   [name], [name]  DOB:  1934-12-22                                        January 03, 2010  CHART #:  [id number]   ONCOLOGIST:  Samul Dada, MD   PRIMARY CARE PHYSICIAN:  Candyce Churn, MD   REASON FOR EVALUATION:  Recurrent pericardial effusion, shortness of  breath.   HISTORY OF PRESENT ILLNESS:  The patient is a very nice 76 year old  Caucasian male, ex-smoker, who had a subxiphoid pericardial window of a  benign inflammatory effusion and pericardial biopsy on July 21st of this  year.  He was last seen in the office approximately 1 month ago, still  having low energy, although he is able to walk 30 minutes comfortably  every morning.  Over the past 3 weeks, he has had weight loss of 6-8  pounds with increasing dyspnea on exertion and even further low energy  levels.  A CT scan of the chest was performed 9 days ago, which shows  enlarged left suprahilar mass in the location of his previous stage III  non-small cell carcinoma of the lung.  He also has diffuse significant  patchy infiltrates, right greater than left lung.  He was evaluated  following that CT scan by Dr. Arline Asp, who placed him on prednisone 20  mg b.i.d. and ordered a PET scan.  The PET scan is scheduled for  tomorrow.  The last PET scan was several months ago earlier this spring,  which showed good response to his chemoradiation therapy with  significant decrease in activity.   The patient has a dry cough.  He denies fever or night sweats.  He has  been drinking boost.  He does have a history of coronary disease and  status post coronary stenting and is on Plavix 75 mg a day and he denies  angina.   A 2-D echo was performed within the past 48 hours, which shows a small  pericardial effusion.  This was also noted on the CT scan.  Overall, LV  function appears to be well preserved.  He does appear to have some  degree of aortic insufficiency, which  is increased from his last echo in  the summer.  The largest diameter of the effusion appears to be 1.5 cm.   PHYSICAL EXAMINATION:  His saturation on room air is 93%.  He is  afebrile.  Blood pressure 104/67 and pulse 88.  He appears to be  somewhat depressed, but in no distress.  HEENT exam is normocephalic.  Breath sounds are slightly diminished at the left base and with  scattered dry rales at the left upper lung field.  I feel no adenopathy  in the neck.  Cardiac exam is regular rhythm without friction rub or  murmur.  Abdominal exam is soft and nontender, and there is peripheral  edema.   LABORATORY DATA:  I reviewed his echo, CT scan, and recent chest x-ray.  He has a small recurrent pericardial effusion.  There appears to be some  increase in aortic insufficiency as well.  He has significant diffuse  bilateral patches of airspace disease consistent with an inflammatory  infectious condition or possibly lymphangitic cancer spread.   IMPRESSION AND RECOMMENDATION:  As far as his recurrent pericardial  effusion, I would not recommend drainage procedure at this time until  more information about recurrent cancer is obtained by the PET scan.  I  am concerned that putting to sleep with general anesthesia with his  diffuse lung disease would possibly result in him being ventilator  dependent.  The pericardial effusion on echo and CT scan does not seem  to be consistent with his symptoms, which I believe are more likely due  to his parenchymal lung disease.  We will follow the results of PET scan  tomorrow and discuss the situation with his physician.   [name], M.D.  Electronically Signed   PV/MEDQ  D:  01/03/2010  T:  01/04/2010  Job:  [id number]

## 2010-08-20 NOTE — Assessment & Plan Note (Signed)
OFFICE VISIT   KEVORK, JOYCE  DOB:  02-25-1935                                        December 05, 2009  CHART #:  [id number]   CURRENT PROBLEMS:  1. Status post subxiphoid pericardial window for drainage of a large      pericardial effusion, July 2011.  2. History of stage IIIB non-small cell carcinoma of the left lung      status post chemoradiation.  3. Mild left pleural effusion.   PRESENT ILLNESS:  The patient is a very nice 76 year old gentleman, who  returns for followup of a mild left pleural effusion after he underwent  subxiphoid pericardial window drainage of a benign inflammatory 400 mL  pericardial effusion.  He was last seen in the office on November 21, 2009, was placed on a course of Lasix and potassium and he returns now  with a chest x-ray.  He states he is walking 30 minutes every morning,  but he still has a low energy level.  He denies orthopnea, cough, or  hemoptysis.   PHYSICAL EXAMINATION:  Vital Signs:  Blood pressure 112/70, pulse is 90,  respirations 18, and saturation 98%.  General:  He is alert and  pleasant.  Lungs:  Breath sounds are clear, but slightly diminished at  the left base.  There is no pericardial friction rub.  The subxiphoid  incision from the pericardial window is well healed.   DIAGNOSTIC TESTS:  His PA and lateral chest x-ray shows some improvement  of the left effusion with blunting of the costophrenic angle.  There are  no significant infiltrates or change in the hilar scarring from the  radiation.   PLAN:  The patient will return to the care of his primary physicians.  He will return here as needed.   [name], M.D.  Electronically Signed   PV/MEDQ  D:  12/05/2009  T:  12/06/2009  Job:  [id number]   cc:   Colleen Can. Deborah Chalk, M.D.  Johnnette Barrios, MD

## 2010-08-20 NOTE — Assessment & Plan Note (Signed)
OFFICE VISIT   ASHLAND, WISEMAN  DOB:  [birth year]                                        November 21, 2009  CHART #:  [id number]   CURRENT PROBLEMS:  1. Status post subxiphoid pericardial window October 25, 2009, for a      moderate-to-large benign pericardial effusion.  2. History of non-small cell carcinoma of the left lung stage IIIB,      completed therapy spring 2011.   PRESENT ILLNESS:  The patient is a very nice 76 year old male who  returns for his first office visit after undergoing a subxiphoid  pericardial window 3 weeks ago.  He is doing well at home and [place].  He has noticed some shortness of breath with exertion.  He denies any chest pain.  The surgical incision from the pericardial  window is healing well.  The cytology and cultures on the fluid were  negative.  He has finished all his chemoradiation for his stage III lung  cancer.   A chest x-ray taken for his office visit today shows a left small  pleural effusion, which is new since his last x-ray before discharge  from the hospital in July.  There is no change in the pericardial  silhouette or evidence of recurrent pericardial effusion.   The patient denies ankle swelling, weight gain, orthopnea, or chest  discomfort or angina.   PHYSICAL EXAMINATION:  Vital Signs:  Blood pressure 120/70, pulse 60,  respirations 18, saturation on room air 98%.  Lungs:  Breath sounds are  clear and equal.  The subxiphoid incision is well healed.  Cardiac:  Rhythm is regular without rub or gallop.  There is no pedal edema.   After reviewing the chest x-ray findings with a new mild left pleural  effusion and blunting of the costophrenic angle associated with dyspnea  on exertion, we will give the patient a course of oral Lasix 40 mg a day  for 14 days.  He will return with a chest x-ray in 2 weeks.  At that  time, if the effusion is not resolved, he will probably need a  thoracentesis  with a check of the cytology of this effusion.   [name], M.D.  Electronically Signed   PV/MEDQ  D:  11/21/2009  T:  11/21/2009  Job:  [id number]   cc:   Candyce Churn, M.D.  Colleen Can. Deborah Chalk, M.D.

## 2010-08-20 NOTE — Consult Note (Signed)
NEW PATIENT CONSULTATION   [name] [name], [name] [name]  DOB:  09/18/1934                                        October 24, 2009  CHART #:  [id number]   PRIMARY ONCOLOGIST:  Samul Dada, MD   REASON FOR CONSULTATION:  Large and progressive pericardial effusion,  status post radiation therapy for lung cancer.   CHIEF COMPLAINT:  Shortness of breath.   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  He has shown an excellent  response to chemoradiation (33 radiation treatments and 7 cycles of  chemotherapy); however, he has had progressive exertional dyspnea.  He  has been followed carefully by Dr. Deborah Chalk with serial 2-D echoes, which  have documented a increasing circumferential pericardial effusion over  the past several weeks.  There are know signs of diastolic collapse of  the left and right atria.  The patient has some orthopnea as well.  He  is being recommended for subxiphoid pericardial drainage of this large  pericardial effusion.  The patient has a cardiac history positive for  coronary disease and had an MI 7 years ago, which was treated with  percutaneous coronary intervention and a drug-eluting stent for which  the patient takes Plavix 75 mg a day.  He has no active angina and his  LV function on his echocardiograms have been normal with EF of 65%.  The  patient is being admitted for pericardial window in the morning.   PAST MEDICAL HISTORY:  1. Hypertension.  2. Dyslipidemia.  3. History of melanoma with local excision from the left neck in 2010.  4. Stage IIIB (T4 N2) non-small-cell carcinoma of the left lung, now      in remission with clean PET scan performed July 2011.   ALLERGIES:  Zoloft.   SOCIAL HISTORY:  The patient is single.  He stopped smoking 15 years  ago.  He does not drink alcohol.  He is  retired.   MEDICATIONS:  Toprol-XL 25 mg daily, Lipitor 10 mg daily, Plavix 75 mg  daily, Protonix 40 mg daily, Celexa 40 mg daily, aspirin 81 mg daily,  omega-3 one tablet daily, Wellbutrin 150 mg daily, temazepam for sleep 1  at bedtime.   REVIEW OF SYSTEMS:  He has gained weight and has not lost his hair with  chemotherapy.  He denies any thoracic injuries or previous thoracic  surgery.  He has had a Port-A-Cath placed for chemotherapy in the right  subclavian vein.  This has not been infected.  He denies history of  claudication, TIA or DVT.  He denies diabetes.  His cardiac status from  coronary disease has been stable since he was treated with a stent by  Dr. Deborah Chalk.  A stress thallium scan was normal in 2009.  He is right-  hand dominant.  His brain MRI shows no metastatic disease.  He has no  neurologic symptoms.   PHYSICAL EXAMINATION:  Vital Signs:  The patient is 6 feet tall, weighs  200 pounds.  Blood pressure 114/77, pulse 71, respirations 18,  saturation on room air 98%.  General:  He is alert and oriented.  HEENT:  Normocephalic.  Neck:  He has no JVD.  He has no  palpable adenopathy in  the neck.  Chest:  Breath sounds are clear.  There is no thoracic  deformity.  Cardiac:  Somewhat distant heart sounds.  No S3 gallop or  friction rub noted.  Abdomen:  Soft, nontender without surgical scars.  Extremities:  No clubbing, cyanosis, or edema.  Peripheral pulses are  intact.  He has no skin rash.  Neurologic:  Alert, oriented without  focal motor deficit.   LABORATORY DATA:  His 2-D echo report shows a large pericardial effusion  with some diastolic collapse of the left and right atria, with pending  tamponade.   PLAN:  The patient will be admitted for subxiphoid pericardial window in  the morning.  I have discussed the procedure in detail with the patient.  He understands and agrees.  We will hold his Plavix tonight, then resume  it postop.   [name], M.D.   Electronically Signed   PV/MEDQ  D:  10/24/2009  T:  10/25/2009  Job:  [id number]   cc:   Samul Dada, M.D.  Colleen Can. Deborah Chalk, M.D.

## 2010-08-23 NOTE — H&P (Signed)
[place]. [facility]  Patient:    [name] Fields, [name] Fields Visit Number: [id number] MRN: [id number]          Service Type: Attending:  Colleen Can. Deborah Chalk, M.D. Dictated by:   Jennet Maduro Earl Gala, R.N., A.N.P. Adm. Date:  11/25/00   CC:         [name], M.D.   History and Physical  CHIEF COMPLAINT:  Recurrent chest pain.  HISTORY OF PRESENT ILLNESS:  [name] Fields is a very pleasant 76 year old white male who has known atherosclerotic cardiovascular disease.  He had previous history of myocardial infarction dating back to April 2001.  At that time he had a stent placed to the left circumflex.  He had repeat angioplasty in July 2001 due to restenosis.  He presented with recurrent chest pain as well as an abnormal stress Cardiolite study and underwent angioplasty with brachytherapy to the left circumflex in February 2002.  He presented for his regular four-month follow-up visit today on November 23, 2000.  At that visit he was complaining of some recurrent chest tightness identical to his previous chest pain syndrome.  It has been occurring anywhere from one to three times a week over the last two months.  He is using nitroglycerin x 1 with full relief.  In light of his recurrent symptoms he is now referred on for repeat coronary angiography.  PAST MEDICAL HISTORY: 1. Atherosclerotic cardiovascular disease with lateral wall myocardial    infarction suffered in April 2001 with stent placed to the left    circumflex.  He has known residual disease in the right coronary.  He    presented with recurrent chest pain and underwent repeat coronary    angiography with subsequent angioplasty as well as undergoing cutting    balloon therapy due to in-stent restenosis.  He presented with recurrent    chest pain in February 2002 and at that time underwent repeat coronary    angiography with angioplasty, and subsequently brachytherapy to the left    circumflex per Dr.  Roger Shelter and Dr. Peter Swaziland. 2. Hypercholesterolemia. 3. Hypertension, currently controlled. 4. Past history of tobacco abuse. 5. Gastroesophageal reflux disease.  ALLERGIES:  None.  INTOLERANCES:  ZOLOFT which causes decreased libido.  CURRENT MEDICINES: 1. Serzone daily. 2. Prevacid 15 mg a day. 3. Imdur 60 mg a day. 4. Altace 2.5 a day. 5. Plavix 75 mg a day. 6. Aspirin daily. 7. Lopressor 25 b.i.d. 8. Zocor 20 mg a day.  FAMILY HISTORY:  Unchanged.  SOCIAL HISTORY:  Unchanged.  REVIEW OF SYSTEMS:  Basically as stated above and otherwise unremarkable except he does note that he has been under a considerable amount of stress. He has had an uncle that recently died, as well as his mother - who lives with him - has recently broken her hip and is requiring 24-hour around-the-clock care.  PHYSICAL EXAMINATION:  GENERAL:  He is a very pleasant white male in no acute distress.  VITAL SIGNS:  Blood pressure 130/70 sitting, 130/80 standing.  Heart rate 52 and regular.  Respirations 18.  He is afebrile.  Weight 188 pounds.  SKIN:  Warm and dry.  Color unremarkable.  LUNGS:  Clear.  HEART:  Regular rhythm.  ABDOMEN:  Soft, positive bowel sounds, nontender.  EXTREMITIES:  Without edema.  NEUROLOGIC:  Intact.  LABORATORY DATA:  Currently pending.  OVERALL IMPRESSION:  [name] Fields presents with recurrent chest discomfort in the setting of known atherosclerotic cardiovascular disease which is identical to  his previous chest pain syndrome.  PLAN:  It is felt that in light of his symptomatology will need to proceed on with elective coronary angiography.  The procedure has been reviewed in full detail and he is willing to proceed. Dictated by:   Jennet Maduro Earl Gala, R.N., A.N.P. Attending:  Colleen Can. Deborah Chalk, M.D. DD:  11/23/00 TD:  11/23/00 Job: [id number]

## 2010-08-23 NOTE — Op Note (Signed)
NAME:  [name] Fields, [name] Fields                       ACCOUNT NO.:  [id number]   MEDICAL RECORD NO.:  [id number]                   PATIENT TYPE:  AMB   LOCATION:  DAY                                  FACILITY:  [facility]   PHYSICIAN:  Boston Service, M.D.             DATE OF BIRTH:  12/16/1934   DATE OF PROCEDURE:  06/13/2003  DATE OF DISCHARGE:                                 OPERATIVE REPORT   LOCAL MEDICAL DOCTOR:  ____________   Margarette AsalBoston Service, M.D.   PREOPERATIVE DIAGNOSES:  1. Balanitis.  2. Phimosis.   POSTOPERATIVE DIAGNOSES:  1. Balanitis.  2. Phimosis.   PROCEDURE:  Outpatient circumcision.   DESCRIPTION OF PROCEDURE:  Patient was prepped and draped in the supine  position after institution of adequate level of general anesthesia.  A  circumferential incision was made proximal to the subcoronal sulcus.  A  similar incision was made proximal to the original incision.  A ring of  redundant fibrotic preputial skin was removed in a parallel lines  technique.  Bleeding sites were lightly cauterized with needle-tip Bovie.  Subcutaneous tissue was reapproximated with interrupted sutures of 3-0  Vicryl.  Skin was reapproximated with interrupted sutures of 3-0 chromic.  The wound was covered with bacitracin ointment, dry gauze, Coban tape.  The  patient was returned to recovery in satisfactory condition.                                               Boston Service, M.D.    RH/MEDQ  D:  06/13/2003  T:  06/13/2003  Job:  [id number]   cc:   Candyce Churn, M.D.  [address] [place]  [place] [address]  Fax: [phone]   Colleen Can. Deborah Chalk, M.D.  Fax: [phone]

## 2010-08-23 NOTE — H&P (Signed)
[place]. [facility]  Patient:    KING, PINZON                    MRN: [id number] Adm. Date:  [id number] Disc. Date: [id number] Attending:  Eleanora Neighbor Dictator:   Jennet Maduro. Earl Gala, R.N., A.N.P. CC:         [name], M.D.             Colleen Can. Deborah Chalk, M.D.                         History and Physical  CHIEF COMPLAINT:  Recurrent chest pain.  HISTORY OF PRESENT ILLNESS:  Mr. Eick is a 76 year old white male who presents to the office for work-in appointment on October 08, 1999.  He was last seen on September 18, 1999, and was felt to be stable from a cardiac standpoint. Today, after having recurrent episode of chest pain, he was asked to come in for evaluation.  He notes that this past Saturday while visiting his family, who reside at a nursing facility, he had sat in the room for approximately one hour and the room was quite hot with temperatures of probably about 90 degrees.  Later on that evening, he had an episode of chest discomfort for which he took two nitroglycerin with basically no relief.  However, he was able to go on to sleep and slept fine.  On Sunday, at around 11:00 a.m., at rest, he had a recurrent episode and took one nitroglycerin with subsequent relief.  Yesterday, while at cardiac rehabilitation, after 30 minutes of exercise, he began having recurrent chest discomfort identical to his previous chest pain syndrome.  Two nitroglycerin were given with complete relief, and he was subsequently referred here for further evaluation.  PAST MEDICAL HISTORY: 1. Atherosclerotic cardiovascular disease with a previous lateral wall    myocardial infarction sustained on July 27, 1999, with stent deployment to    the left circumflex.  Other catheterization findings at that time revealed    moderate coronary disease in the right coronary with essentially normal    left ventricular function and minimal atherosclerosis in the LAD and    intermediate coronary arteries. 2. Hyperlipidemia, currently on Zocor. 3. Past tobacco abuse. 4. History of hypertension, currently controlled.  ALLERGIES:  ZOLOFT causes decreased libido.  CURRENT MEDICATIONS: 1. Zocor 20 mg q.d. 2. Lopressor 25 mg b.i.d. 3. Aspirin daily. 4. Altace 2.5 mg daily. 5. Imdur 60 mg q.d. 6. Prevacid 15 mg q.d.  FAMILY HISTORY:  Negative for coronary disease.  SOCIAL HISTORY:  Unchanged from the prior record.  REVIEW OF SYSTEMS:  Otherwise as stated above.  PHYSICAL EXAMINATION:  GENERAL:  He is a pleasant white male in no acute distress.  VITAL SIGNS:  Blood pressure 130/80 sitting and 130/90 standing, heart rate 52, respirations 18, afebrile.  SKIN:  Skin is warm and dry.  Color unremarkable.  LUNGS:  Clear.  HEART:  Regular rhythm.  ABDOMEN:  Soft, positive bowel sounds and nontender.  EXTREMITIES:  Without edema.  Distal pulses are intact.  NEUROLOGIC:  Intact.  There are no gross focal deficits.  RECTAL:  Deferred.  LABORATORY DATA:  EKG:  Shows normal sinus rhythm with non-specific changes.  IMPRESSION: 1. Recurrent chest pain in the setting of known atherosclerotic cardiovascular    disease with a previous myocardial infarction sustained in April 2001 with    stent deployment to the  left circumflex with no residual disease in the    right coronary and left system. 2. Hypercholesterolemia. 3. Hyperlipidemia.  PLAN:  We will proceed on with elective cardiac catheterization.  The risks, procedure and benefits have been explained and he is going to proceed. DD:  10/08/99 TD:  10/08/99 Job: [id number]

## 2010-08-23 NOTE — Cardiovascular Report (Signed)
[place]. [facility]  Patient:    [name] Fields, [name] Fields                    MRN: [id number] Proc. Date: 06/04/00 Adm. Date:  [id number] Disc. Date: [id number] Attending:  Eleanora Neighbor CC:         Colleen Can. Deborah Chalk, M.D.   Cardiac Catheterization  INDICATION FOR PROCEDURE:  The patient is a 75 year old white male who has recurrent restenosis in the left circumflex at the site of a previous stent. The patient underwent successful cutting dilatation by Dr. Deborah Chalk on June 03, 2000.  He returns for brachytherapy today.  CARDIOLOGIST:  Peter M. Swaziland, M.D.  ACCESS:  Via the left femoral artery using standard Seldinger technique.  EQUIPMENT: An 8-French sheath, 8-French FL4 guide, 0.014 hyperfloppy extra support, 300 cm wire and a 30 mm Beta-Cath delivery system.  DESCRIPTION OF PROCEDURE:  After initial guide shots were obtained, the left circumflex was easily wired.  We then position the Beta-Cath system across the stented area, centering the device over the distal part of the stent which was treated with cutting balloon previously.  The radioactive seeds were deployed for a total of 3 minutes and 10 seconds with 18.4 Gy.  The patient tolerated the procedure well.  FINAL INTERPRETATION:  Successful intracoronary brachytherapy of the left circumflex coronary artery for in-stent restenosis. DD:  06/04/00 TD:  06/05/00 Job: [id number]

## 2010-08-23 NOTE — Cardiovascular Report (Signed)
[place]. [facility]  Patient:    [name] Fields, [name] Fields Visit Number: [id number] MRN: [id number]          Service Type: CAT Location: [facility] 28[address] [id number] Attending Physician:  Eleanora Neighbor Proc. Date: 11/25/00 Adm. Date:  11/25/2000                          Cardiac Catheterization  REFERRING PHYSICIAN:  [name], M.D.  HISTORY:  The patient is a 76 year old male with previous stent to the left circumflex with recurrent re-stenosis and subsequently had brachytherapy in February 2002.  He presents for his regular visit but has chest tightness and is referred for followup catheterization.  PROCEDURE:  Left heart catheterization with selective coronary angiography, left ventricular angiography.  TYPE AND SITE OF ENTRY:  Percutaneous right femoral artery.  CATHETERS:  A 6 French 4 curved Judkins right and left coronary catheters, 6 French pigtail ventriculographic catheter.  CONTRAST MATERIAL:  Omnipaque.  MEDICATIONS GIVEN PRIOR TO THE PROCEDURE:  Valium 10 mg p.o.  MEDICATIONS GIVEN DURING THE PROCEDURE:  Versed 2 mg IV, Ancef 1 g IV.  COMMENTS:  The patient tolerated the procedure well.  We attempted to do Perclose but really did not have an excellent closure.  HEMODYNAMIC DATA:  The aortic pressure was 134/52, LV was 133/16.  There was no aortic valve gradient noted on pullback.  LEFT VENTRICULOGRAPHY:  The left ventricular angiogram was performed in the RAO position.  Overall cardiac size and silhouette are normal.  Left ventricular function is normal.  CORONARY ARTERIES:  Coronary arteries arise and distribute normally. 1. Right coronary artery:  The right coronary artery has a 30-40%    narrowing in the early midportion.  At the crux there is a 20-30%    narrowing.  Otherwise, there is no significant obstructive disease,    although there is early atherosclerosis most definitely. 2. Left main:  The left main coronary artery is  normal. 3. Left anterior descending:  The left anterior descending is a moderate sized    vessel.  It continues two thirds of the way down the vessel and then    basically trifurcates into two diagonal vessels and a continuation of the    left anterior descending.  There is no significant disease in this left    anterior descending, although the distal vessels are small. 4. Left circumflex:  The left circumflex has segmental 60% to 70%    narrowing before the stent.  The stent is actually patent and it does    not appear to have significant re-stenosis and is not necessarily    restenotic at the proximal or terminal portion of the stent.  The worse    stenotic area is actually in the 2 cm prior to the stent beginning.    However, this would appear to be relatively stable at this point from    angioplasty. 5. Intermediate coronary:  There is a moderate sized intermediate coronary    without significant stenosis.  OVERALL IMPRESSION: 1. Normal left ventricular function. 2. Persistent patency within the stent in the left circumflex but with    60-70% narrowing proximal to the origin of the stent with 30-40% narrowing    in the right coronary artery with minimal atherosclerosis otherwise.  In light of these findings, I would like to try to manage the patient medically. Attending Physician:  Eleanora Neighbor DD:  11/25/00 TD:  11/25/00 Job:  [id number]

## 2010-08-23 NOTE — Op Note (Signed)
[name]. [facility]  Patient:    [name] Fields, [name] Fields                    MRN: [id number] Proc. Date: 07/30/99 Adm. Date:  [id number] Attending:  Eleanora Fields                           Operative Report  HISTORY OF PRESENT ILLNESS:  [name] is a 76 year old gentleman who basically has enjoyed good health.  He has a history of feeling somewhat weak and washed ut over the last couple of months, but then began having approximately a 24-hour history of right upper chest pain, which was fairly continuous.  The pain is described as sharp, somewhat worse with a deep breath.  It is not necessarily acute.  It has not radiated to his neck, but has been to the left arm, with some numbness over the last few hours prior to admission.  He has had some dyspnea but no nausea, vomiting, or sweats.   He has felt fatigued over the last couple of months, especially with exercise.  He had an electrocardiogram which showed lateral ST changes and is admitted for evaluation.  PAST MEDICAL HISTORY: 1. He was a previous smoker but stopped in 1997. 2. History of mild hypertension. 3. Hypercholesterolemia.  CURRENT MEDICATIONS: 1. Viagra. 2. Prevacid.  ALLERGIES:  ZOLOFT causes decreased libido.  SOCIAL HISTORY:  He is a smoker.  He stopped in 1997.  FAMILY HISTORY:  No history of coronary artery disease.  Father died of lung cancer.  He was a smoker.  PHYSICAL EXAMINATION:  VITAL SIGNS:  Weight 210 pounds, blood pressure 164/100 initially.  HEENT:  Unremarkable.  NECK:  Supple, without bruits.  HEART:  A regular rate and rhythm.  There is some mild tenderness posteriorly on the right.  LUNGS:  Basically clear with some tenderness in the right upper chest wall area.  ABDOMEN:  Nontender.  GENITOURINARY/RECTAL:  The prostate was nontender.  Stool was in the vault.  EXTREMITIES:  No edema.  Chest X-ray showed no acute changes.  Electrocardiogram  showed T-wave elevation in Lead I, and aVL, with reciprocal ST depression in V1 through V4.  Cardiac enzymes:  Are positive with a total CPK of 184, CPK-MB of 21, troponin 0.11.  Creatinine was 0.8, glucose 113.  Electrolytes were normal.  Hematocrit as 46.0, white count 8500.  OVERALL IMPRESSION:  Acute lateral myocardial infarction.  PLAN:  We will go directly to the cardiac catheterization laboratory for acute intervention. DD:  07/30/99 TD:  08/01/99 Job: [id number]

## 2010-08-23 NOTE — Discharge Summary (Signed)
. [facility]  Patient:    [name] Fields, [name] Fields                    MRN: [id number] Adm. Date:  [id number] Disc. Date: 08/03/99 Attending:  Eleanora Neighbor Dictator:   Jennet Maduro. Earl Gala, R.N., A.N.P. CC:         [name], M.D.             Colleen Can. Deborah Chalk, M.D.                           Discharge Summary  DISCHARGE DIAGNOSES: 1. Chest pain, compatible with a lateral wall myocardial infarction with    subsequent cardiac catheterization as well as stent deployment to the left    circumflex. 2. Residual coronary disease with a 60% right coronary artery lesion 2 cm from    the ostium with a 70% narrowing at the ostium of the posterior descending. 3. Gastroesophageal reflux disease. 4. Past history of tobacco abuse. 5. History of hypertension, currently controlled. 6. History of hypercholesterolemia, currently started on Zocor.  HISTORY OF PRESENT ILLNESS:  [name] Fields is a 76 year old white male who has basically enjoyed good health.  He has a history of feeling somewhat weak and washed out over the last couple of months of unsure etiology.  He presents to the emergency room with an approximate 24-hour history of right upper chest pain which has been fairly continuous, is described as sharp and somewhat worse with a deep breath.  It is not necessarily acute.  It is not radiated to the neck but has been to the left arm with some numbness over the few hours prior prior to admission.  He has had some shortness of breath but no nausea, vomiting, or diaphoresis.  A 12-lead echocardiogram showed lateral ST changes. He was subsequently admitted and taken on to the cardiac catheterization laboratory for acute intervention.  Please see the dictated history and physical for further patient presentation and profile.  LABORATORY DATA:  On admission white count was 8.5, platelets 243, hemoglobin 16, hematocrit 46.  PT and PTT were unremarkable.   Chemistries reveal sodium 141, potassium 4.3, chloride 103, CO2 29, BUN 15, creatinine 0.8, and glucose of 113.  Cardiac enzymes:  First CK revealed a total of 184 with MB of 21, troponin I was positive at 0.11.  Second cardiac enzymes showed a total of 2090.  Third CK total was 1762 with an MB of 292.  The fourth showed a CK total of 1123 with an MB of 139.4, and the fifth was 568 with an MB of 30.5.  EKG is as described above.  Chest x-ray is currently pending ______ .  HOSPITAL COURSE:  The patient was admitted by [name] Fields.  He was placed on IV heparin and nitroglycerin.  Cardiology consult followed, and the patient was taken on to the cardiac catheterization laboratory for acute intervention.  The procedure was performed by [name] Fields, and tolerated with no known complications.  Left ventriculogram in the RAO position demonstrated essentially normal function.  The left main coronary is normal.  The LAD is essentially normal with mild posterior hypokinesis noted.  The intermediate is normal.  The left circumflex is 100% occluded.  A right coronary artery is a large, dominant vessel with a 60% narrowing 2 cm from the ostium, as well as a 70% narrowing at the ostium of the PDA.  Angioplasty with subsequent stent deployment with a 25 mm 3.0 NIR stent was placed and inflated to a maximum of 10 atm with an overall satisfactory result obtained.  Postprocedure, the patient received IV Aggrastat for approximately 12 hours, and this was tolerated without problems.  He was transferred to the coronary care unit for further monitoring and evaluation.  Throughout the remainder of the patients hospitalization, he continued to stabilize quite nicely.  IV nitroglycerin was able to be weaned over to topical therapy.  Cardiac rehabilitation was initiated, and the patient was also placed on low-dose ACE inhibitor I therapy.  Plans are now made for the patient to be discharged this  weekend if he continues to stabilize and do well.  He is currently up and ambulatory with cardiac rehabilitation.  He has had no recurrent chest pain.  The groin has remained stable.  Blood pressure is 110/60.  CONDITION ON DISCHARGE:  Stable.  DISCHARGE MEDICATIONS: 1. He will not resume his Viagra. 2. He was placed on Zocor 20 mg a day. 3. Lopressor 50, 1/2 tablet b.i.d. 4. Plavix 75 mg x 21 days. 5. Aspirin daily. 6. Altace 2.5 daily. 7. Imdur 60 mg daily. 8. He is given the okay to resume his own Prevacid. 9. He is given a prescription for nitroglycerin p.r.n. under the tongue for    chest pain.  ACTIVITY:  No driving.  No sexual intercourse.  He is given the okay to walk 10-15 minutes two times a day.  DIET:  Low fat, low cholesterol.  WOUND CARE:  He is to wash his groin with soap and water, and to call for any problems.  FOLLOW-UP:  Will plan on seeing him in the office in about 10-14 days, and he is asked to call to schedule that appointment. DD:  08/02/99 TD:  08/02/99 Job: [id number]

## 2010-08-23 NOTE — Cardiovascular Report (Signed)
[place]. [facility]  Patient:    SHADD, DUNSTAN                    MRN: [id number] Proc. Date: 10/08/99 Adm. Date:  [id number] Attending:  Eleanora Neighbor CC:         [name], M.D.                        Cardiac Catheterization  REFERRING PHYSICIAN:  [name], M.D.  HISTORY:  Mr. Besecker presents with a two to three-day history of recurrent angina.  He had a previous stent placed in his left circumflex in April 2001.  PROCEDURES: 1. Left heart catheterization. 2. Selective coronary angiography. 3. Left ventricular angiography. 4. Angioplasty using the cutting balloon on left circumflex coronary.  TYPE AND SITE OF ENTRY:  Percutaneous right femoral artery.  CATHETERS: 1. 6-French four-curved Judkins right and left coronary catheters. 2. 6-French pigtail ventriculographic catheter. 3. 7-French FL4 guiding catheter. 4. Hi-Torque Floppy guide wire (long). 5. 3.0 x 15 mm cutting balloon.  MEDICATIONS GIVEN DURING PROCEDURE: 1. Versed 2 mg IV. 2. ReoPro. 3. IV nitroglycerin. 4. Heparin 5000 units.  COMMENTS:  The patient tolerated the procedure well.  HEMODYNAMIC DATA: 1. Aortic pressure:  120/70. 2. Left ventricular pressure:  120/10. There was no aortic valve gradient noted on pullback.  ANGIOGRAPHIC DATA:  CORONARY ARTERIES: 1. LEFT MAIN:  Short and normal. 2. LEFT ANTERIOR DESCENDING:  Was a somewhat tortuous vessel.  It had    irregularities, but no significant focal disease. 3. LEFT CIRCUMFLEX: This is a previously stented vessel.  There was a 30-40%    ostial narrowing.  Within the stent there was an area of focal restenosis.    There was 50% narrowing on the proximal, and 30-40% on the distal portions    of the stent.  The stent was a 3.0 x 25 mm NIR Royale Elite stent. 4. RIGHT CORONARY ARTERY:  Has 60% narrowing proximally, and 70% narrowing at    the origin of the posterior descending.  The lesion  in the proximal portion    of the right coronary artery is somewhat questionable as far as potential    significance for ischemia.  LEFT VENTRICULOGRAM:  Performed in the RAO position.  The overall cardiac size and silhouette are normal.  Left ventricular function is normal.  ANGIOPLASTY PROCEDURE:  We elected to proceed on with angioplasty within the stent for a focal re-narrowing.  After consultation with Dr. Veneda Melter, we proceeded on with using the cutting balloon.  The Hi-Torque Floppy guide wire was passed across the lesion.  The cutting balloon was positioned in the mid portion of the vessel, and was expanded to 8 atm.  We then expanded proximally where there was some narrowing right at the beginning of the stent.  After we had completed the vessel during the stented segment was widely patent, and there appeared to be some irregularities just distal to the stent; the cutting balloon was positioned there and inflated to 7 atm.  The final angiographic result shows an excellent angioplasty, with less than 10% residual stenosis.  OVERALL IMPRESSION: 1. Normal left ventricular function. 2. Successful angioplasty of the left circumflex within the previously stented    segment; using a cutting balloon. 3. Residual 60% proximal right coronary artery and 70% narrowing at the ostium    of the posterior descending coronary. DD:  10/08/99 TD:  10/09/99 Job: [id number]

## 2010-08-23 NOTE — Cardiovascular Report (Signed)
. [facility]  Patient:    [name] Fields, [name] Fields                    MRN: [id number] Proc. Date: 06/03/00 Adm. Date:  [id number] Attending:  Eleanora Neighbor CC:         [name], M.D.   Cardiac Catheterization  INDICATIONS:  Mr. Mccants is a 76 year old gentleman with history of stented left circumflex acutely.  He had restenosis in July treated with angioplasty with a cutting balloon.  He presented having recurrent symptoms.  He had an exercise Cardiolite study which showed equivocal posterior ischemia and is now referred for follow-up catheterization.  PROCEDURE:  Left heart catheterization with selective coronary angiography, left ventricular angiography with angioplasty of the restenosis within the stent of the left circumflex.  TYPE AND SITE OF ENTRY:  Percutaneous right femoral artery.  CATHETERS:  6 French 4 curved Judkins right and left coronary catheters, 6 French pigtail ventriculography catheter, 7 [place] guide catheter, 3.0 x 10 mm cutting balloon, and a long floppy guide wire.  CONTRAST:  Omnipaque.  MEDICATIONS:  Prior to the procedure; Valium 10 mg p.o.  During the procedure; Integrilin, IV nitroglycerin, and Versed 2 mg IV.  COMMENTS:  The patient tolerated the procedure well.  HEMODYNAMIC DATA:  The aortic pressure was 125/56, left ventricular pressure 107/8.  There was no aortic valve gradient noted on pullback.  ANGIOGRAPHIC DATA:  Left main coronary artery is normal.  Left circumflex continues as an obtuse marginal.  The proximal segment is 40 to 50% narrowed.  In the midportion of the stent, there is a 75% narrowing. Proximally in the stent there is a 40% to 50% narrowing.  Distal runoff was satisfactory.  Intermediate coronary artery is basically normal.  Left anterior descending trifurcates distally.  It is essentially normal.  Right coronary artery is a large dominant vessel.  It has a 40 to  50% narrowing proximally.  The distal vessels are satisfactory with mild narrowing at the ostium of the posterior descending vessel.  Left ventricular angiogram was performed in the RAO position.  Overall cardiac size and silhouette are normal.  Left ventricular function is normal.  ANGIOPLASTY PROCEDURE:  The proximal portion of the left circumflex was somewhat tubular, but there was a stenosis in the midportion of the stent that appeared to be at least 75% and somewhat focal in nature.  The more proximal portions of the left circumflex had a somewhat tubular and segmental narrowing and did seemingly extend into the proximal portion of the stented with some mild narrowing in this proximal portion of the stent.  However, there was an area approximately 10 to 15 mm in length that had well preserved persistent patency and then in the distal aspect of the stent, there was well preserved patency and because of this, we elected to proceed on with an angioplasty in the midportion of the stent with the goal of returning at a later date for brachiotherapy.  The guide catheter and the 3.0 x 10 mm cutting balloon were easily passed into the stent.  The cutting balloon was inflated to a maximum of 9 atmospheres.  There was essentially no residual stenosis in this segment of the stented vessel.  IV nitroglycerin was given postdilatation with excellent flow and an excellent result into this circumflex vessel.  IMPRESSION: 1. Essentially normal left ventricular function. 2. Restenosis within the stent in the left circumflex with residual proximal  disease in the left circumflex, mild disease in the intermediate coronary,    minimal atherosclerosis in the left anterior descending, and mild (40%)    narrowing in the right coronary artery. 3. Successful angioplasty of the midportion of the stent to the left    circumflex with plans for brachiotherapy. DD:  06/03/00 TD:  06/03/00 Job:  [id number]

## 2010-08-23 NOTE — H&P (Signed)
[place]. [facility]  Patient:    [name] Fields, [name] Fields Visit Number: [id number] MRN: [id number]          Service Type: CAT Location: [facility] 28[address] [id number] Attending Physician:  Eleanora Neighbor Dictated by:   Jennet Maduro Earl Gala, R.N., A.N.P. Admit Date:  11/25/2000 Discharge Date: 11/25/2000   CC:         [name], M.D.   History and Physical  CHIEF COMPLAINT:  Recurrent chest pain.  HISTORY OF PRESENT ILLNESS:  [name] Fields is a pleasant 76 year old white male who has known coronary disease.  He has had a previous history of myocardial infarction in April 2001 and had a stent placed in the left circumflex at that time.  He had repeat angioplasty in July 2001 due to restenosis.  He had abnormal stress Cardiolite after proceeding again with recurrent chest pain and subsequently had angioplasty with brachytherapy to the left circumflex in February of 2002.  His last cardiac catheterization was in August of 2002 at which time he demonstrated normal LV function, persistent patency within the stent of the left circumflex with 60-70% narrowing proximally to the origin with 30-40% narrowing in the right coronary with minimal atherosclerosis otherwise.  He presents to our office as a work in appointment on 03/17/01 with recurrent chest pain over the past week that has been relieved with nitroglycerin and is identical to his previous chest pain syndrome.  He notes that over the past three to four weeks he has felt more sluggish and tired.  He is now referred on for elective coronary angiography.  PAST MEDICAL HISTORY: 1. Atherosclerotic cardiovascular disease with lateral wall myocardial infarction April 2001 with stent to the left circumflex.  Status post angioplasty with cutting balloon therapy due to end stent restenosis    in July 2001.  Subsequent brachytherapy/angioplasty to the left circumflex    February 2002.  Status post cardiac catheterization  August 2002 with    results as noted above. 2. Hypercholesterolemia. 3. Hypertension. 4. History of tobacco abuse. 5. Gastroesophageal reflux disease. 6. History of anxiety and depression.  ALLERGIES:  None.  INTOLERANCES:  Zoloft causes decreased libido.  CURRENT MEDICATIONS: 1. Trazodone 150 at bedtime. 2. Serzone 300 at bedtime. 3. Prevacid 30 mg a day. 4. Imdur 120 mg a day. 5. Altace 2.5 mg daily. 6. Plavix 75 mg a day. 7. Zocor 20 mg a day. 8. Toprol XL 100 mg a day.  FAMILY HISTORY:  Unchanged.  SOCIAL HISTORY:  Unchanged.  REVIEW OF SYSTEMS:  He is basically as stated above and otherwise unremarkable.  He has had no recent cough, fever or flu.  No shortness of breath.  His chest pain is identical to his previous chest pain syndrome and is responsive to nitroglycerin.  He has had no palpitations.  He has had generalized feeling of fatigue.  He has had no abdominal pain, constipation, diarrhea.  Otherwise, review of systems is unremarkable.  PHYSICAL EXAMINATION:  GENERAL:  He is a pleasant, alert white male in no acute distress.  VITAL SIGNS:  Blood pressure 130/70 sitting, 140/80 sitting.  Heart rate is 52.  Respirations are 18.  He is afebrile. Weight 185.5 pounds.  SKIN:  Warm and dry.  Color is unremarkable.  LUNGS:  Clear.  HEART:  Regular rhythm.  ABDOMEN:  Soft, positive bowel sounds, nontender.  EXTREMITIES:  Without edema.  NEUROLOGIC:  Intact.  There are no gross focal deficits.  LABORATORY:  Data currently pending.  IMPRESSION: 1. Recurrent chest pain in setting of known atherosclerotic cardiovascular disease with multiple revascularization attempts to the left circumflex. 2. Hypercholesterolemia. 3. History of tobacco abuse. 4. Gastroesophageal reflux disease. 5. History of anxiety and depression.  PLAN:  In light of these symptoms, will proceed on again with elective coronary angiography.  Will proceed on March 22, 2001.  The  procedure has been discussed in full detail and he is willing to proceed. Dictated by:   Jennet Maduro Earl Gala, R.N., A.N.P. Attending Physician:  Eleanora Neighbor DD:  03/19/01 TD:  03/19/01 Job: [id number]

## 2010-08-23 NOTE — H&P (Signed)
[place]. [facility]  Patient:    [name] Fields, [name] Fields                      MRN: [id number] Adm. Date:  07/30/99 Attending:  [name], M.D. Dictator:   Tillman Sers, N.P.                         History and Physical  DATE OF BIRTH:  Jun 12, 1934  CHIEF COMPLAINT:  Right upper chest pain, dyspnea.  HISTORY OF PRESENT ILLNESS:  Narayan Scull is a 76 year old white male patient of Dr. Robley Fries, who presents with a 24-hour history of right upper chest pain which he has been fairly continuous.  He describes the pain as sharp, worse with deep breath but not with activity.  The pain has not radiated anteriorly up the neck, but he has had, over the last few hours, left arm "numbness" radiating down to the wrist.  He has had dyspnea since the chest pain began but no nausea, sweats or palpitations.  The patient has never had chest pain prior to the last 24 hours but does report feeling bad in general over the last couple of months.  He specifically states he has felt fatigued and just not up to par.  His fatigue has been especially bad with exercise.  In the office he was found to be hypertensive at 150/100 and showed ECG changes.  He is being admitted for the same.  REVIEW OF SYSTEMS:  As above, in general he has had no fevers or chills. CHEST:  He has had no nocturnal dyspnea.  No peripheral edema. MUSCULOSKELETAL:  He has had some posterior neck pain for two months, present with movement of the neck only.  No previous injury.  PAST MEDICAL HISTORY:  1. Previous smoker, having stopped in 1997.  2. Mild hypertension.  3. Hypercholesterolemia.  4. Functional pylorospasm.  5. Questionable history of peptic ulcer disease.  6. GERD.  7. Depression, resolved.  8. Erectile dysfunction.  9. Pneumonia in 1998, right lower lobe. 10. Calcaneal spurs. 11. Allergic rhinitis. 12. Diverticulosis. 13. Lactose intolerance. 14. History of tobacco  abuse, stopped in 1997.  PAST SURGICAL HISTORY:  He has had an inguinal hernia repair approximately 24 years ago.  MEDICATIONS:  1. Prevacid 30 mg p.o. daily.  2. Viagra one-half of a 100 mg tablet 1-4 hours prior to sexual activity.  ALLERGIES/INTOLERANCES:  Zoloft caused decreased libido.  SOCIAL HISTORY:  He is single and has a girlfriend.  Smoking:  Stopped, as above.  Alcohol:  Drinks occasionally but not heavily.  No illicit drug use.  FAMILY HISTORY:  No history of coronary artery disease in the family.  Father died of lung cancer; he was a smoker.  Mother is 57 years old and apparently doing well.  OBJECTIVE:  VITAL SIGNS:  Weight is 210, temperature 97, pulse 60, BP 164/100 in the right arm.  O2 saturation are 96% on room air.  GENERAL:  In general he is alert, although somewhat anxious.  SKIN:  Warm and dry.  HEENT:  Eyes:  PERRLA.  Ear, nose and throat unremarkable.  NECK:  No JVD.  No carotid bruits.  He does have discomfort posteriorly on the right side in the paravertebral muscles on right and left bending only.  CHEST:  A regular rate and rhythm without murmur.  He does have a chest wall tenderness that is fairly reproducible  with the symptoms in the right upper chest area.  LUNGS:  Clear throughout.  ABDOMEN:  Nontender.  Bowel sounds are present and there is no hepatosplenomegaly.  GU:  Deferred.  RECTAL:  Prostate is nontender.  There was no stool in the rectal vault.  EXTREMITIES:  Examination reveals no edema.  He moves all extremities without difficulty.  NEUROLOGICAL:  Cranial nerves 2-12 intact.  Gait steady.  Speech appropriate.  Chest x-ray reveals no acute problems.  It is unchanged from prior.  ECG does show changes from prior.  IMPRESSION:  Chest pain, dyspnea.  PLAN:  Will admit, start IV heparin and nitroglycerin.  Have cardiology consult.  May consider V/Q scanning to rule out PE but heparin is being started.  Dr. Kevan Ny and Dr.  Deborah Chalk to follow. DD:  07/30/99 TD:  07/30/99 Job: [id number]

## 2010-08-23 NOTE — H&P (Signed)
[place]. [facility]  Patient:    [name] Fields, [name] Fields                      MRN: [id number] Adm. Date:  06/03/00 Attending:  Colleen Can. Deborah Chalk, M.D. Dictator:   Jennet Maduro. Earl Gala, R.N., A.N.P. CC:         [name], M.D.   History and Physical  CHIEF COMPLAINT:  Recurrent chest pain.  HISTORY OF PRESENT ILLNESS:  [name] Fields is a pleasant 76 year old white male who has known atherosclerotic cerebrovascular disease.  He has had previous myocardial infarction sustained in April of 2001.  At that time, he had stent placed to the left circumflex.  He had recurrent chest pain and subsequently had to have repeat catheterization as well as cutting balloon performed in July of 2001.  He presented for regular follow-up earlier this month and has had occasional episodes of recurrent chest pain.  He was referred on for a stress Cardiolite study which was performed and demonstrated excellent exercise tolerance.  He had adequate blood pressure response and clinically there were no symptoms.  Electrocardiographically was negative for ischemia, but his perfusion images were compatible with posterolateral ischemia.  In light of these findings, he is now referred on for repeat cardiac catheterization with plans for possible brachytherapy.  PAST MEDICAL HISTORY: 1. ASCVD with a previous lateral wall myocardial infarction sustained July 27, 1999, with stent placement to the left circumflex.  He had recurrent    chest pain in July of 2001 and demonstrated in-stent restenosis upon    cardiac catheterization and subsequently had angioplasty with a 3.0 x 15 mm    cutting balloon performed with an overall satisfactory result.  He has    known residual atherosclerotic cardiovascular disease with a 60% right    coronary, normal left main, and irregularities of the LAD. 2. Gastroesophageal reflux disease. 3. History of hypertension, currently controlled. 4.  Hypercholesterolemia. 5. Past tobacco use.  ALLERGIES:  No known drug allergies.  He has intolerance to Zoloft which causes decreased labido.  MEDICATIONS: 1. Zocor 20 mg a day. 2. Lopressor 25 mg b.i.d. 3. Aspirin daily. 4. Altace 2.5 mg daily. 5. Imdur 60 mg a day. 6. Prevacid 15 mg a day.  FAMILY HISTORY:  Unchanged per the prior record.  SOCIAL HISTORY:  Unchanged per the prior record.  REVIEW OF SYSTEMS:  Otherwise as stated above and unremarkable in its entirety.  PHYSICAL EXAMINATION:  GENERAL:  He is a very pleasant white male who appears younger than his stated age.  He is in no acute distress.  VITAL SIGNS:  Blood pressure 110/70 sitting, 120/70 standing.  Heart rate 56 and regular.  Respirations are 18.  He is afebrile.  Weight is 187 pounds.  SKIN:  Warm and dry.  Color is unremarkable.  HEENT:  Unremarkable.  LUNGS:  Clear.  HEART:  Regular rhythm, there is no murmur.  ABDOMEN:  Soft with positive bowel sounds and no masses.  EXTREMITIES:  Show no evidence of edema.  Distal pulses are intact.  NEUROLOGICAL:  Intact with no gross focal deficits.  LABORATORY DATA:  Currently pending.  IMPRESSION: 1. Recurrent episodes of chest pain now in the setting of an abnormal    stress Cardiolite study demonstrating posterolateral ischemia. 2. Known atherosclerotic cardiovascular disease with previous myocardial    infarction in April of 2001 with subsequent stent placement to the left    circumflex as  well as subsequent angioplasty with cutting balloon in July    of 2001. 3. Hypercholesterolemia. 4. Hypertension.  PLAN:  Will need to proceed on with cardiac catheterization.  The risks, procedure, and benefits have been explained to him again in full detail.  Also will give consideration to possible brachytherapy. DD:  06/02/00 TD:  06/02/00 Job: [id number]

## 2010-08-23 NOTE — H&P (Signed)
NAME:  [name] Fields, [name] Fields                       ACCOUNT NO.:  [id number]   MEDICAL RECORD NO.:  [id number]                   PATIENT TYPE:  OIB   LOCATION:                                       FACILITY:  [facility]   PHYSICIAN:  Colleen Can. Deborah Chalk, M.D.            DATE OF BIRTH:  07/16/1934   DATE OF ADMISSION:  07/11/2003  DATE OF DISCHARGE:                                HISTORY & PHYSICAL   CHIEF COMPLAINT:  Shortness of breath as well as chest pain.   HISTORY OF PRESENT ILLNESS:  [name] Fields is a 76 year old white male who  has multiple medical problems. He has known coronary artery disease. He had  his last catheterization in December 2002, which showed normal LV function  with non-obstructive coronary disease (patent stent in the left circumflex  with previous brachio therapy in February 2002), a 40% narrowing in the  right proximal coronary. At that time it was felt that he was best managed  medically. His last Cardiolite study was in September 2004, which was  satisfactory. He presents to the office with a work-in appointment on July 05, 2003 with complaints of not feeling right over the course of the past  two months. However, over the past week he has had progressive shortness of  breath as well as chest pain. He has not used nitroglycerin. His discomfort  is not worse with exertion. It primarily occurs at rest. He notes that his  exercise tolerance has been reduced over the last week or so as well. He has  felt sluggish and fatigued over the past two months. He denies any  significant stress. He notes that his symptoms are very similar to his  previous chest pain syndrome and he is now referred for repeat cardiac  catheterization.   PAST MEDICAL HISTORY:  1. History of a lateral MI dating back to April 2001 with subsequent stent     placement to the left circumflex with residual coronary disease at that     time. He had repeat catheterization with angioplasty, cutting  balloon,     and subsequently brachio therapy performed in February 2002. His last     catheterization was in December 2002 with results as noted above.  2. Hyperlipidemia.  3. Gastroesophageal reflux disease.  4. Past tobacco abuse.  5. Hypertension.  6. Anxiety/depression.   ALLERGIES:  No known drug allergies.   INTOLERANCES:  Zoloft.   CURRENT MEDICATIONS:  1. Toprol XL 100 mg a day.  2. Lipitor 10 daily.  3. Plavix 75 mg a day.  4. Altace 2.5 daily.  5. Norvasc 2.5 daily.  6. Viagra p.r.n.  7. Celexa 20 mg a day.  8. Protonix daily.  9. Wellbutrin 300 mg a day.   FAMILY HISTORY:  Noncontributory.   SOCIAL HISTORY:  He lives alone. He has no alcohol or tobacco use.   REVIEW OF SYSTEMS:  Basically as noted  above and is otherwise unremarkable.  He has had no recent fever, flu, or cough. He has had no dizziness, no light-  headedness, no frank syncope. No complaints of lower extremity edema.   PHYSICAL EXAMINATION:  VITAL SIGNS:  Weight is 200 pounds. Blood pressure  120/80 sitting, 110/90 standing. Heart rate is 52 and regular. Respirations  18. He is afebrile.  SKIN:  Warm and dry. Color is unremarkable.  HEENT:  Unremarkable.  LUNGS:  Basically clear.  HEART:  Regular rhythm.  ABDOMEN:  Soft. Positive bowel sounds. Nontender.  EXTREMITIES: Without edema.  NEUROLOGIC:  Intact.  There are no gross focal deficits.   LABORATORY DATA:  Pertinent labs are pending.   OVERALL IMPRESSION:  1. Recurrent episodes of chest pain and shortness of breath.  2. Known coronary disease with previous revascularization as well as     previous myocardial infarction.  3. Hyperlipidemia.  4. History of hypertension.  5. Anxiety/depression.   PLAN:  We will proceed on with repeat cardiac catheterization. The procedure  was reviewed in full detail and he is willing to proceed on Tuesday, July 11, 2003.      Sharlee Blew, N.P.                     Colleen Can. Deborah Chalk,  M.D.    LC/MEDQ  D:  07/05/2003  T:  07/05/2003  Job:  [id number]   cc:   Candyce Churn, M.D.  [address] [place]  [place] [address]  Fax: [phone]

## 2010-08-23 NOTE — Cardiovascular Report (Signed)
[place]. [facility]  Patient:    [name] Fields, [name] Fields Visit Number: [id number] MRN: [id number]          Service Type: CAT Location: [facility] 2852 01 Attending Physician:  Eleanora Neighbor Dictated by:   Colleen Can Deborah Chalk, M.D. Proc. Date: 03/22/01 Admit Date:  03/22/2001   CC:         [name], M.D.   Cardiac Catheterization  INDICATIONS: The patient presents with recurrent anginal-type chest pain. He was last seen and had catheterization after having brachytherapy on June 04, 2000. He had recurrent chest pain on November 25, 2000, characteristic of his angina. At that time, he had no real significant obstructive disease. He has done well but then has recurrence of substernal chest pain, very characteristic for his angina.  PROCEDURE: Left heart catheterization with selective coronary angiography, left ventricular angiography.  TYPE AND SITE OF ENTRY: Percutaneous right femoral artery.  CATHETERS: A 6 French 4 curved Judkins right and left coronary catheters, 6 French pigtail ventriculographic catheter.  CONTRAST MATERIAL: Omnipaque.  MEDICATIONS GIVEN PRIOR TO THE PROCEDURE: Valium 10 mg p.o.  MEDICATIONS GIVEN DURING THE PROCEDURE: Versed 2 mg IV.  COMMENTS: The patient tolerated the procedure well.  HEMODYNAMIC DATA: The aortic pressure was 140/75. The LV pressure was 140/9-24. There was no aortic valve gradient noted on pullback.  ANGIOGRAPHIC DATA:  LEFT VENTRICULOGRAPHY: Left ventricular angiogram was performed in the RAO position.  Overall cardiac size and silhouette are normal.  The global ejection fraction was 70%. There was no abnormal regional wall motion.  CORONARY ARTERIES: The coronary arteries arise and distribute normally. 1. Left main coronary artery: Left main coronary artery is normal. 2. Left anterior descending: The left anterior descending is a moderate sized    vessel. It essentially divides in its  midportion with a large diagonal    vessel, a smaller mid branch and then a continuation of left anterior    descending. The left anterior descending is essentially normal with only    minor irregularities. 3. Left circumflex: The left circumflex continues primarily as an obtuse    marginal vessel. The vessel is approximately 2 to 2.5 mm in diameters    throughout. The stented segment is present and there is no significant    re-stenosis within the stent. There appears to be satisfactory flow    and while there clearly irregularities in the left circumflex, there is    certainly no obstructive flow. 4. Right coronary artery: The right coronary artery is a dominant vessel.    Proximally, there is a 40-50% narrowing. There is scattered irregularities,    otherwise. It is a moderately large vessel.  OVERALL IMPRESSION: 1. Normal left ventricular function. 2. Nonobstructive coronary artery disease (patent stent in the    left circumflex with previous brachytherapy on June 04, 2000);    40% narrowing in the proximal right coronary artery.  DISCUSSION: We will continue to manage the patient medically. He is far enough out from his initial catheterization with two essentially normal catheterizations over the past year since his brachytherapy. I feel we probably can manage him medically certainly over the near future. Dictated by:   Colleen Can Deborah Chalk, M.D. Attending Physician:  Eleanora Neighbor DD:  03/22/01 TD:  03/22/01 Job: [id number]

## 2010-08-23 NOTE — Discharge Summary (Signed)
[name]. [facility]  Patient:    [name] Fields, [name] Fields                    MRN: [id number] Adm. Date:  [id number] Disc. Date: [id number] Attending:  Eleanora Neighbor Dictator:   Jennet Maduro. Earl Gala, R.N., A.N.P. CC:         [name], M.D.   Discharge Summary  CHIEF COMPLAINT:  Abnormal stress Cardiolite study.  SECONDARY DIAGNOSES: 1. Atherosclerotic cardiovascular disease with a previous history of    myocardial infarction in April of 2001 with subsequent stent placement to    the left circumflex in April of 2001 and subsequent angioplasty in July of    2001 due to restenosis. 2. Hypercholesterolemia currently on Zocor. 3. Hypertension, currently controlled.  HISTORY OF PRESENT ILLNESS:  Mr. [name] is a pleasant 76 year old male who has known coronary artery disease.  He had a previous heart attack last year and had restenosis with cutting balloon performed.  He presents for his regular follow-up appointment and was referred on for stress Cardiolite study. He had had occasional episodes of recurrent chest pain.  His Cardiolite study was abnormal with posterolateral ischemia and he was referred on for repeat cardiac catheterization as well as probable angioplasty.  Please see the dictated history and physical for further patient presentation and profile.  LABORATORY DATA ON ADMISSION:  The PT and PTT were unremarkable.  Chemistry studies showed a sodium of 143, potassium 3.8, chloride 107, CO2 26, BUN 20, creatinine 1.0, and a glucose of 66.  The CBC showed a hemoglobin of 15, hematocrit 45, white count 5.2, and platelets 242.  HOSPITAL COURSE:  The patient was admitted electively on June 03, 2000, through the short-stay center in order to undergo coronary angiography.  The overall procedure was tolerated well without any known complications.  There was normal left ventricular function.  There was restenosis within the stent in the left  circumflex with residual proximal disease in the left circumflex, mild disease in the intermediate, mild atherosclerosis in the LAD, and mild (40%) narrowing in the right coronary.  He subsequently underwent successful angioplasty of the mid portion of the stent to the left circumflex and was subsequently referred on for brachytherapy.  Brachytherapy was performed the following day by Peter M. [place], M.D., on June 04, 2000, without any known complications.  Today on June 05, 2000, he is doing well.  He did have some vomiting with his sheath pulled on the evening prior to discharge, but otherwise has done well. He has been up and ambulatory.  He has had no recurrent chest pain. Postprocedure CK-MBs have been unremarkable.  Today he is felt to be a stable candidate for discharge.  DISCHARGE CONDITION:  Improved.  DISCHARGE MEDICATIONS:  He will resume all of his home medicines, which include Lopressor 50 mg half of a tablet b.i.d., Zocor 20 mg a day, Altace 2.5 mg daily, aspirin daily, Prevacid 30 mg a day, and Imdur 60 mg a day.  We will have him add Plavix 75 mg a day for the next six months.  ACTIVITY:  To be light over the next few days and then he may gradually resume his activity.  DIET:  Low fat.  WOUND CARE:  He is to place an ice pack if needed to the groins.  FOLLOW-UP:  He is to call our office for any problems.  Otherwise we plan on seeing him on Tuesday, June 16, 2000, at  10 a.m. DD:  06/05/00 TD:  06/06/00 Job: [id number]

## 2010-08-23 NOTE — Discharge Summary (Signed)
[place]. [facility]  Patient:    [name] Fields, [name] Fields                    MRN: [id number] Adm. Date:  [id number] Disc. Date: 10/09/99 Attending:  Eleanora Neighbor Dictator:   Jennet Maduro. Earl Gala, R.N., A.N.P. CC:         Colleen Can. Deborah Chalk, M.D.             [name], M.D.                           Discharge Summary  DISCHARGE DIAGNOSES: 1. Recurrent chest pain compatible with instent restenosis with subsequent    angioplasty with a 3.0 x 15 mm cutting balloon to a maximum of 8 atm with    an overall satisfactory result. 2. He has residual atherosclerotic cardiovascular disease with 60% right    coronary artery narrowings, normal left main coronary artery,    irregularities of the LAD. 3. Recent posterior wall myocardial infarction sustained in April 2001 with    subsequent stent deployment to the left circumflex. 4. Gastroesophageal reflux disease. 5. History of hypertension, currently controlled. 6. Hypercholesterolemia now on Zocor.  HISTORY OF PRESENT ILLNESS:  Mr. Trapani is a 76 year old white male who was seen in the office as a work-in appointment on October 08, 1999, with recurrent episodes of chest pain. He was subsequently referred for elective cardiac catheterization.  Please see the dictated history and physical for further patient presentation and profile.  ADMISSION LABORATORY DATA:  A 12-lead electrocardiogram performed in the office shows normal sinus rhythm with no acute changes.  Troponin I is negative. Cardiac enzyme is negative x 1. PT and PTT are unremarkable. CBC shows hemoglobin 13, hematocrit 40, white count 6.2, platelets are 218. Chemistries revealed sodium 139, potassium 3.7, chloride 107, CO2 28, BUN 17, creatinine 0.8, and glucose of 87.  HOSPITAL COURSE:  The patient was actually referred on over from the office for elective cardiac catheterization to occur later on in the day per Colleen Can. Deborah Chalk, M.D. The  overall procedure was tolerated well without any known difficulties. Left ventricular function is noted to be normal. The right coronary artery demonstrates 60% narrowings in the right coronary, as well as at the area of the PDA. Left main coronary is normal. LAD has mild irregularities. The left circumflex where the stent had previously been placed shows a 90% focal restenosis, and angioplasty with 3.0 x 15 mm cutting balloon was subsequently performed with an overall satisfactory result obtained. The patient did receive IV Reopro, heparin, and IV nitroglycerin and was transferred to 6500 post procedure.  It will be our plans now for the patient to be discharged in the morning if deemed stable on a.m. rounds per Maisie Fus A. Patty Sermons, M.D. with further evaluation on an outpatient basis.  CONDITION ON DISCHARGE:  Stable.  DISCHARGE MEDICATIONS:  He will resume all of his previous home medicines which include: 1. Prevacid 15 mg a day. 2. Aspirin daily. 3. Altace 2.5 mg a day. 4. Zocor 20 mg a day. 5. Lopressor 25 b.i.d. 6. Imdur 60 mg a day. 7. We will add Plavix 75 mg a day for the next 21 days.  ACTIVITIES:  Light for the next few days with no driving for 24 hours. He may return to cardiac rehab next week.  DIET:  Low-fat, low-cholesterol.  WOUND CARE:  He is to place an  ice pack if needed to the groin. He is to call for any problems, such as recurrent chest pain, shortness of breath, or trouble with the right groin, etc., and the office number is made available to him.  FOLLOW-UP:  Otherwise, we will plan on seeing him back in the office in 10 days, and he is asked to call on July 5 to schedule that appointment. DD:  10/08/99 TD:  10/09/99 Job: [id number]

## 2010-08-23 NOTE — Cardiovascular Report (Signed)
NAME:  [name] Fields, [name] Fields                       ACCOUNT NO.:  [id number]   MEDICAL RECORD NO.:  [id number]                   PATIENT TYPE:  OIB   LOCATION:  2866                                 FACILITY:  [facility]   PHYSICIAN:  Colleen Can. Deborah Chalk, M.D.            DATE OF BIRTH:  11-04-1934   DATE OF PROCEDURE:  07/11/2003  DATE OF DISCHARGE:  07/11/2003                              CARDIAC CATHETERIZATION   HISTORY:  [name] has had previous stent and angioplasty to the left  circumflex.  He is referred for recurrent chest pain.   PROCEDURE:  Left heart catheterization, selective coronary artery, left  ventricular angiography.   SITE OF ENTRY:  Percutaneous right femoral artery with Angio-Seal.   CATHETER:  A 6-French 4 curved Judkins right and left coronary catheter, 6-  French pigtail ventriculographic catheter.   CONTRAST MATERIAL:  Omnipaque.   MEDICATIONS GIVEN PRIOR TO PROCEDURE:  Valium 10 mg p.o.   MEDICATIONS GIVEN DURING PROCEDURE:  Versed 1 mg IV.   COMMENTS:  The patient tolerated the procedure well.   HEMODYNAMIC DATA:  The aortic pressure was 110/65.  LV was 110/6-10.  There  was no aortic valve gradient noted on pullback.   ANGIOGRAPHIC DATA:  1. Left main coronary artery is normal.  2. Left anterior descending:  A moderate-sized vessel that extended     approximately two-thirds of the way along the septum and then basically     divided into three branches.  There was a diagonal vessel that was free     of disease.  There was a smaller, intermediate-type vessel with minor     irregularities at the ostium, but it basically was a small vessel.  The     continuation of the left anterior descending extended to the apex and was     free of disease.  3. Left circumflex:  The left circumflex has 40-50 ostial narrowing.  The     stent was widely patent.  There is no significant obstructive disease.  4. Intermediate coronary:  There is a moderately large  intermediate coronary     artery without significant obstructive disease.  5. Right coronary artery.  The right coronary artery is a small-to-moderate-     sized dominant vessel.  There is 30-40% narrowing in the proximal one-     third.  At the crux, there was 30-50% narrowing at the posterior     descending.  There was satisfactory flow into the posterior descending     and posterolateral branches.   LEFT VENTRICULAR ANGIOGRAM:  A left ventricular angiogram was performed in  the RAO position.  Overall cardiac size and silhouette were normal.  There  was mild inferobasal and diaphragmatic hypokinesia with a global ejection  fraction estimated to be at 60%.  There was no mitral regurgitation,  intracardiac calcification or intracavitary filling defect.   OVERALL IMPRESSION:  1. Mild inferobasilar hypokinesia with well-preserved global  left     ventricular function.  2. Persistent patency of the stent in the left circumflex with moderate     coronary atherosclerosis with 30% narrowing in the proximal right     coronary artery and 30-50% narrowing at the ostium of the posterior     descending with 30-50% ostial left circumflex lesion with mild     irregularities scattered otherwise.   DISCUSSION:  It is felt that [name] does not have significant  obstructive coronary disease at this point in time.                                               Colleen Can. Deborah Chalk, M.D.    SNT/MEDQ  D:  07/11/2003  T:  07/12/2003  Job:  [id number]

## 2010-08-23 NOTE — Cardiovascular Report (Signed)
[place]. [facility]  Patient:    [name] Fields, [name] Fields                    MRN: [id number] Proc. Date: 07/30/99 Adm. Date:  [id number] Attending:  Eleanora Neighbor CC:         [name], M.D.             [name] Fields, M.D.                        Cardiac Catheterization  INDICATIONS:  [name] Fields is a 76 year old gentleman who presents with approximately a days worth of substernal chest discomfort associated with mild ST elevation in I and aVL and reciprocal changes in V1 through V4.  He is referred for catheterization after having positive CK-MBs.  PROCEDURE:  Left heart catheterization with selective coronary angiography, left ventricular angiography, with angioplasty and stenting of the left circumflex artery and closure of the vessel with Perclose.  TYPE AND SITE OF ENTRY:  Percutaneous right femoral artery.  CATHETERS:  A 7 French sheath, 6 French 4 curved Judkins right and left coronary catheters, 6 French pigtail ventriculographic catheter, 7 [place] FL4 guide, Hi-Torque Floppy guide wire, 3.0 x 20 mm CrossSail balloon, and subsequent a 3.0 x 25 mm NIR Royal Elite 3.0 stent.  MEDICATIONS GIVEN DURING THE PROCEDURE:  Versed 2 mg IV, Aggrastat, IV nitroglycerin and heparin.  COMMENTS:  The patient tolerated the procedure well.  HEMODYNAMIC DATA:  The aortic pressure was 164/80,  LV was 174/16. There was no aortic valve gradient noted on pullback.  ANGIOGRAPHIC DATA:  LEFT VENTRICULAR ANGIOGRAM:  The left ventricular angiogram was performed in the RAO position.  Overall cardiac size and silhouette were normal.  There is essentially normal left ventricular function in this view, although looking at the arteriograms and motion of the coronaries, there appears to be some posterior hypokinesis.  The global ejection fraction in the RAO technique would be 55-60%.  1. Left main coronary artery:  Left main coronary artery is  normal. 2. Left anterior descending:  Left anterior descending crosses the apex.  In    its midportion it essentially trifurcates with a bifurcating diagonal    vessel and then a continuation of the LAD.  It has no significant focal    disease. 3. Intermediate coronary artery has only minimal irregularities and is    essentially normal. 4. Left circumflex:  The left circumflex is totally occluded in its early    midportion. 5. Right coronary artery:  The right coronary artery is a large dominant    vessel.  There is a 60% stenosis approximately 2 cm from the ostium.    There is a 70% narrowing at the ostium of the posterior descending    coronary.  ANGIOPLASTY PROCEDURE:  We changed catheters to the 7 [place] FL4 guide and were able to pass the Hi-Torque Floppy guide wire across the stenosis in the left circumflex.  The 3.0 x 20 mm CrossSail balloon crossed without difficulty.  We had satisfactory dilatation.  We returned with a 25 x 3.0 mm stent and it was placed and dilated to a maximum of 10 atmospheres.  It was felt to have excellent location proximally, but distally there was tapering of approximately 20% narrowing just beyond this stent.  It was not felt that it warranted a distal stent.  It is felt to be a satisfactory result with excellent TIMI  grade 3 flow.  OVERALL IMPRESSION: 1. Acute posterior myocardial infarction with an occluded left circumflex    with satisfactory stenting of the left circumflex. 2. Moderate coronary atherosclerosis in the right coronary artery. 3. Essentially normal left ventricular function. 4. Minimal atherosclerosis in the left anterior descending and intermediate    coronaries. DD:  07/30/99 TD:  07/31/99 Job: [id number]

## 2010-09-23 ENCOUNTER — Other Ambulatory Visit (HOSPITAL_COMMUNITY): Payer: Self-pay | Admitting: Oncology

## 2010-09-23 ENCOUNTER — Ambulatory Visit (HOSPITAL_COMMUNITY)
Admission: RE | Admit: 2010-09-23 | Discharge: 2010-09-23 | Disposition: A | Discharge status: Home / Self Care | Payer: Medicare Other | Source: Ambulatory Visit | Attending: Oncology | Admitting: Oncology

## 2010-09-23 ENCOUNTER — Encounter (HOSPITAL_BASED_OUTPATIENT_CLINIC_OR_DEPARTMENT_OTHER): Payer: Medicare Other | Admitting: Oncology

## 2010-09-23 DIAGNOSIS — R0989 Other specified symptoms and signs involving the circulatory and respiratory systems: Secondary | ICD-10-CM

## 2010-09-23 DIAGNOSIS — C3490 Malignant neoplasm of unspecified part of unspecified bronchus or lung: Secondary | ICD-10-CM

## 2010-09-23 DIAGNOSIS — C3410 Malignant neoplasm of upper lobe, unspecified bronchus or lung: Secondary | ICD-10-CM

## 2010-09-23 DIAGNOSIS — R269 Unspecified abnormalities of gait and mobility: Secondary | ICD-10-CM

## 2010-09-23 DIAGNOSIS — R0609 Other forms of dyspnea: Secondary | ICD-10-CM

## 2010-09-23 LAB — COMPREHENSIVE METABOLIC PANEL
ALT: 14 U/L (ref 0–53)
AST: 18 U/L (ref 0–37)
Alkaline Phosphatase: 71 U/L (ref 39–117)
Calcium: 9.6 mg/dL (ref 8.4–10.5)
Chloride: 105 mEq/L (ref 96–112)
Creatinine, Ser: 1.04 mg/dL (ref 0.50–1.35)

## 2010-09-23 LAB — CBC WITH DIFFERENTIAL/PLATELET
Basophils Absolute: 0 10*3/uL (ref 0.0–0.1)
Eosinophils Absolute: 0.1 10*3/uL (ref 0.0–0.5)
HCT: 45.6 % (ref 38.4–49.9)
HGB: 15.3 g/dL (ref 13.0–17.1)
LYMPH%: 14.4 % (ref 14.0–49.0)
MCV: 91.6 fL (ref 79.3–98.0)
MONO%: 7.9 % (ref 0.0–14.0)
NEUT#: 4.6 10*3/uL (ref 1.5–6.5)
Platelets: 198 10*3/uL (ref 140–400)
RDW: 13.9 % (ref 11.0–14.6)

## 2010-09-23 LAB — LACTATE DEHYDROGENASE: LDH: 164 U/L (ref 94–250)

## 2010-09-30 ENCOUNTER — Ambulatory Visit
Admission: RE | Admit: 2010-09-30 | Discharge: 2010-09-30 | Disposition: A | Discharge status: Home / Self Care | Payer: Medicare Other | Source: Ambulatory Visit | Attending: Oncology | Admitting: Oncology

## 2010-09-30 ENCOUNTER — Ambulatory Visit: Payer: Medicare Other | Admitting: Pulmonary Disease

## 2010-09-30 DIAGNOSIS — R269 Unspecified abnormalities of gait and mobility: Secondary | ICD-10-CM

## 2010-09-30 MED ORDER — GADOBENATE DIMEGLUMINE 529 MG/ML IV SOLN
19.0000 mL | Freq: Once | INTRAVENOUS | Status: AC | PRN
  Administered 2010-09-30: 19 mL via INTRAVENOUS

## 2010-10-31 ENCOUNTER — Encounter: Payer: Self-pay | Admitting: Nurse Practitioner

## 2010-10-31 ENCOUNTER — Encounter: Payer: Self-pay | Admitting: Cardiology

## 2010-11-04 ENCOUNTER — Ambulatory Visit (INDEPENDENT_AMBULATORY_CARE_PROVIDER_SITE_OTHER): Payer: Medicare Other | Admitting: Nurse Practitioner

## 2010-11-04 ENCOUNTER — Encounter: Payer: Self-pay | Admitting: Nurse Practitioner

## 2010-11-04 VITALS — BP 128/80 | HR 64 | Ht 70.0 in | Wt 208.4 lb

## 2010-11-04 DIAGNOSIS — C3490 Malignant neoplasm of unspecified part of unspecified bronchus or lung: Secondary | ICD-10-CM

## 2010-11-04 DIAGNOSIS — I2510 Atherosclerotic heart disease of native coronary artery without angina pectoris: Secondary | ICD-10-CM | POA: Insufficient documentation

## 2010-11-04 DIAGNOSIS — I313 Pericardial effusion (noninflammatory): Secondary | ICD-10-CM

## 2010-11-04 DIAGNOSIS — I319 Disease of pericardium, unspecified: Secondary | ICD-10-CM

## 2010-11-04 NOTE — Assessment & Plan Note (Signed)
He has had remote MI with PCI to the LCX. Last stress test was in 2009. He is mostly limited by his breathing. No change in his current medicines. We will see him back in 6 months. Patient is agreeable to this plan and will call if any problems develop in the interim.

## 2010-11-04 NOTE — Assessment & Plan Note (Signed)
He will see Dr. Arline Asp tomorrow to discuss further treatment options.

## 2010-11-04 NOTE — Patient Instructions (Signed)
We are going to update your ultrasound We will see you back in 6 months. I will have you see Dr. Elease Hashimoto Call for any problems.

## 2010-11-04 NOTE — Progress Notes (Signed)
[name] Fields Date of Birth: Aug 17, 1934   History of Present Illness: [name] Fields is seen today for his 6 month visit. He is seen for Dr. Elease Hashimoto. He is a former patient of Dr. Ronnald Nian. He has lung cancer. He has had prior chemo and radiation. He has a known pericardial effusion and has had a prior window last year by Dr. Maren Beach. His CT scan from March suggested new nodules on the right and a moderate size effusion. Clinically he has shortness of breath. He notes the summer heat has been hard on him. No chest pain. He is not dizzy. He will find out tomorrow if he needs further treatment for these new nodules. No real chest pain. He walks intermittently. It has been hard to lose weight.   Current Outpatient Prescriptions on File Prior to Visit  Medication Sig Dispense Refill  . buPROPion (WELLBUTRIN SR) 150 MG 12 hr tablet Take 150 mg by mouth daily.        . clopidogrel (PLAVIX) 75 MG tablet Take 75 mg by mouth daily.        . metoprolol succinate (TOPROL-XL) 25 MG 24 hr tablet Take 12.5 mg by mouth 2 (two) times daily.        . pantoprazole (PROTONIX) 40 MG tablet Take 40 mg by mouth 2 (two) times daily.        . Tamsulosin HCl (FLOMAX) 0.4 MG CAPS Take 0.4 mg by mouth daily.        . predniSONE (DELTASONE) 20 MG tablet Take 10 mg by mouth every other day.        . tiotropium (SPIRIVA) 18 MCG inhalation capsule Place 18 mcg into inhaler and inhale 2 (two) times daily.          Allergies  Allergen Reactions  . Sertraline Hcl     Past Medical History  Diagnosis Date  . IHD (ischemic heart disease)     Remote MI in 2001 with stent to the LCX  . Hypertension   . Hyperlipidemia   . SOB (shortness of breath)     Chronic with known fibrosis  . Hypokinesia     MILD INFERIOR  . GERD (gastroesophageal reflux disease)   . Lactose intolerance   . Diverticulosis   . Hearing loss   . Non-small cell carcinoma of lung     Dx in October of 2010  . Pleural effusion, left   . Pericardial  effusion July 2011    s/p pericardial window   . Normal nuclear stress test 2009    EF 63%. No ischemia. 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    Past Surgical History  Procedure Date  . Pericardial window July 2011  . Cardiovascular stress test 05/2007    EF 63%  . Coronary stent placement 07/1999    STENT TO THE LEFT CIRCUMFLEX  . Cardiac catheterization 07/11/2003    EF 60%, REPEAT, WHICH SHOWED 30% PROXIMAL NARROWING IN THE RIGHT  CORONARY ARTERY  . Transthoracic echocardiogram 02/22/2010    EF 55-60%    History  Smoking status  . Former Smoker  . Quit date: 04/07/1994  Smokeless tobacco  . Not on file    History  Alcohol Use No    Family History  Problem Relation Age of Onset  . Lung cancer Father   . Heart disease Neg Hx     Review of Systems: The review of systems is positive for worsening shortness of breath. No real chest pain.  All other  systems were reviewed and are negative.  Physical Exam: BP 128/80  Pulse 64  Ht 5\' 10"  (1.778 m)  Wt 208 lb 6.4 oz (94.53 kg)  BMI 29.90 kg/m2 Patient is very pleasant and in no acute distress. Skin is warm and dry. Color is normal.  HEENT is unremarkable. Normocephalic/atraumatic. PERRL. Sclera are nonicteric. Neck is supple. No masses. No JVD. Lungs are clear. Cardiac exam shows a regular rate and rhythm. No rub that I could appreciate.  Abdomen is obese and soft. Extremities are without edema. Gait and ROM are intact. No gross neurologic deficits noted.  LABORATORY DATA:   Assessment / Plan:

## 2010-11-04 NOTE — Assessment & Plan Note (Signed)
Has known pericardial effusion. Classified as moderate per last CT in March. Will update his echo. He does not have evidence of tamponade.

## 2010-11-05 ENCOUNTER — Other Ambulatory Visit (HOSPITAL_COMMUNITY): Payer: Self-pay | Admitting: Oncology

## 2010-11-05 ENCOUNTER — Encounter (HOSPITAL_BASED_OUTPATIENT_CLINIC_OR_DEPARTMENT_OTHER): Payer: Medicare Other | Admitting: Oncology

## 2010-11-05 DIAGNOSIS — R0609 Other forms of dyspnea: Secondary | ICD-10-CM

## 2010-11-05 DIAGNOSIS — C3490 Malignant neoplasm of unspecified part of unspecified bronchus or lung: Secondary | ICD-10-CM

## 2010-11-05 DIAGNOSIS — C3410 Malignant neoplasm of upper lobe, unspecified bronchus or lung: Secondary | ICD-10-CM

## 2010-11-05 LAB — CBC WITH DIFFERENTIAL/PLATELET
Basophils Absolute: 0 10*3/uL (ref 0.0–0.1)
EOS%: 1.7 % (ref 0.0–7.0)
HGB: 15.4 g/dL (ref 13.0–17.1)
LYMPH%: 15.5 % (ref 14.0–49.0)
MCH: 30.8 pg (ref 27.2–33.4)
MCV: 91.7 fL (ref 79.3–98.0)
MONO%: 8.3 % (ref 0.0–14.0)
Platelets: 220 10*3/uL (ref 140–400)
RBC: 4.99 10*6/uL (ref 4.20–5.82)
RDW: 14.2 % (ref 11.0–14.6)

## 2010-11-05 LAB — COMPREHENSIVE METABOLIC PANEL
AST: 19 U/L (ref 0–37)
Albumin: 4.4 g/dL (ref 3.5–5.2)
Alkaline Phosphatase: 66 U/L (ref 39–117)
BUN: 23 mg/dL (ref 6–23)
Potassium: 5 mEq/L (ref 3.5–5.3)
Total Bilirubin: 0.7 mg/dL (ref 0.3–1.2)

## 2010-11-12 ENCOUNTER — Ambulatory Visit (HOSPITAL_COMMUNITY): Payer: Medicare Other | Attending: Cardiovascular Disease

## 2010-11-12 DIAGNOSIS — I319 Disease of pericardium, unspecified: Secondary | ICD-10-CM | POA: Insufficient documentation

## 2010-11-12 DIAGNOSIS — I080 Rheumatic disorders of both mitral and aortic valves: Secondary | ICD-10-CM | POA: Insufficient documentation

## 2010-11-12 DIAGNOSIS — Z85118 Personal history of other malignant neoplasm of bronchus and lung: Secondary | ICD-10-CM | POA: Insufficient documentation

## 2010-11-12 DIAGNOSIS — R0989 Other specified symptoms and signs involving the circulatory and respiratory systems: Secondary | ICD-10-CM | POA: Insufficient documentation

## 2010-11-12 DIAGNOSIS — K219 Gastro-esophageal reflux disease without esophagitis: Secondary | ICD-10-CM | POA: Insufficient documentation

## 2010-11-12 DIAGNOSIS — I313 Pericardial effusion (noninflammatory): Secondary | ICD-10-CM

## 2010-11-12 DIAGNOSIS — R0609 Other forms of dyspnea: Secondary | ICD-10-CM | POA: Insufficient documentation

## 2010-11-12 DIAGNOSIS — I10 Essential (primary) hypertension: Secondary | ICD-10-CM | POA: Insufficient documentation

## 2010-11-12 DIAGNOSIS — I2589 Other forms of chronic ischemic heart disease: Secondary | ICD-10-CM | POA: Insufficient documentation

## 2010-11-12 DIAGNOSIS — I252 Old myocardial infarction: Secondary | ICD-10-CM | POA: Insufficient documentation

## 2010-11-12 DIAGNOSIS — E785 Hyperlipidemia, unspecified: Secondary | ICD-10-CM | POA: Insufficient documentation

## 2010-11-12 DIAGNOSIS — I318 Other specified diseases of pericardium: Secondary | ICD-10-CM

## 2010-11-21 ENCOUNTER — Ambulatory Visit (INDEPENDENT_AMBULATORY_CARE_PROVIDER_SITE_OTHER): Payer: Medicare Other | Admitting: Pulmonary Disease

## 2010-11-21 ENCOUNTER — Encounter: Payer: Self-pay | Admitting: Pulmonary Disease

## 2010-11-21 VITALS — BP 118/72 | HR 61 | Temp 97.5°F | Ht 70.0 in | Wt 201.4 lb

## 2010-11-21 DIAGNOSIS — J438 Other emphysema: Secondary | ICD-10-CM

## 2010-11-21 DIAGNOSIS — C3410 Malignant neoplasm of upper lobe, unspecified bronchus or lung: Secondary | ICD-10-CM

## 2010-11-21 NOTE — Patient Instructions (Signed)
Continue on current breathing medications Would like to get you thru the high heat and humidity, to see if your breathing improves.  If it does not improve, will re-evaluate.  Currently, can find nothing new to address. Keep working on exercise program. followup with me in 6mos, but sooner if things are not improving.

## 2010-11-21 NOTE — Assessment & Plan Note (Addendum)
The pt has moderate copd by prior pfts, and there is nothing by history or exam today to suggest an acute exacerbation.  He has no true wheezing on exam, but rather upper airway wheezing ?due to postnasal drip?  He has a little more doe than baseline that he believes is due to the heat and humidity.  After a discussion, he and I have decided to make no changes to his meds, and to see what his upcoming ct chest shows.  Will also see if breathing improves with more moderate temperatures in the coming weeks.  He is to call me if having worsening symptoms.

## 2010-11-21 NOTE — Progress Notes (Signed)
  Subjective:    Patient ID: [name] Fields, male    DOB: 09/17/1934, 76 y.o.   MRN: [id number]  HPI The pt comes in today for f/u of his known moderate emphysema.  He has known stage 3 lung cancer, and has been treated with xrt, complicated by ?xrt pneumonitis.  He comes in today for f/u, where he is maintaining on symbicort and spiriva for his breathing.  He has not seen a big difference with the meds, but I have explained to him he has other issues which can contribute to doe (heart disease, rx for lung cancer, deconditioning).  He denies any chest congestion or purulence, but does have audible "wheezing" that is clearly coming from his upper airway.     Review of Systems  Constitutional: Negative for fever and unexpected weight change.  HENT: Negative for ear pain, nosebleeds, congestion, sore throat, rhinorrhea, sneezing, trouble swallowing, dental problem, postnasal drip and sinus pressure.   Eyes: Negative for redness and itching.  Respiratory: Positive for cough, shortness of breath and wheezing. Negative for chest tightness.   Cardiovascular: Negative for palpitations and leg swelling.  Gastrointestinal: Negative for nausea and vomiting.  Genitourinary: Negative for dysuria.  Musculoskeletal: Negative for joint swelling.  Skin: Negative for rash.  Neurological: Negative for headaches.  Hematological: Does not bruise/bleed easily.  Psychiatric/Behavioral: Negative for dysphoric mood. The patient is not nervous/anxious.        Objective:   Physical Exam Well-developed male in no acute distress Naris without purulence or discharge Chest with good air flow bilaterally, and no true wheezing.  There is prominent upper airway pseudo-wheezing, with tubular transmission to the lower lung zones.  There is bronchial breath sounds in the upper chest at the thoracic inlet. Cardiac exam reveals a regular rate and rhythm Lower extremities without edema, no cyanosis noted Alert and  oriented, moves all 4 extremities.       Assessment & Plan:

## 2010-11-26 ENCOUNTER — Telehealth: Payer: Self-pay | Admitting: *Deleted

## 2010-11-26 ENCOUNTER — Ambulatory Visit (HOSPITAL_COMMUNITY)
Admission: RE | Admit: 2010-11-26 | Discharge: 2010-11-26 | Disposition: A | Discharge status: Home / Self Care | Payer: Medicare Other | Source: Ambulatory Visit | Attending: Oncology | Admitting: Oncology

## 2010-11-26 ENCOUNTER — Encounter (HOSPITAL_COMMUNITY): Payer: Self-pay

## 2010-11-26 DIAGNOSIS — R918 Other nonspecific abnormal finding of lung field: Secondary | ICD-10-CM | POA: Insufficient documentation

## 2010-11-26 DIAGNOSIS — C3490 Malignant neoplasm of unspecified part of unspecified bronchus or lung: Secondary | ICD-10-CM | POA: Insufficient documentation

## 2010-11-26 DIAGNOSIS — I319 Disease of pericardium, unspecified: Secondary | ICD-10-CM | POA: Insufficient documentation

## 2010-11-26 DIAGNOSIS — J9819 Other pulmonary collapse: Secondary | ICD-10-CM | POA: Insufficient documentation

## 2010-11-26 MED ORDER — IOHEXOL 300 MG/ML  SOLN
80.0000 mL | Freq: Once | INTRAMUSCULAR | Status: AC | PRN
  Administered 2010-11-26: 80 mL via INTRAVENOUS

## 2010-11-26 NOTE — Telephone Encounter (Signed)
msg left to call back for echo results, new MR/TR will treat medically,

## 2010-11-26 NOTE — Telephone Encounter (Signed)
Results given from echo and MR/TR will be treated medically. Pt verbalized understanding. Alfonso Ramus RN

## 2010-12-04 ENCOUNTER — Encounter (HOSPITAL_BASED_OUTPATIENT_CLINIC_OR_DEPARTMENT_OTHER): Payer: Medicare Other | Admitting: Oncology

## 2010-12-04 ENCOUNTER — Other Ambulatory Visit (HOSPITAL_COMMUNITY): Payer: Self-pay | Admitting: Oncology

## 2010-12-04 DIAGNOSIS — R0989 Other specified symptoms and signs involving the circulatory and respiratory systems: Secondary | ICD-10-CM

## 2010-12-04 DIAGNOSIS — C3490 Malignant neoplasm of unspecified part of unspecified bronchus or lung: Secondary | ICD-10-CM

## 2010-12-04 DIAGNOSIS — Z923 Personal history of irradiation: Secondary | ICD-10-CM

## 2010-12-04 DIAGNOSIS — C3410 Malignant neoplasm of upper lobe, unspecified bronchus or lung: Secondary | ICD-10-CM

## 2010-12-04 DIAGNOSIS — Z79899 Other long term (current) drug therapy: Secondary | ICD-10-CM

## 2010-12-04 LAB — COMPREHENSIVE METABOLIC PANEL
AST: 21 U/L (ref 0–37)
Alkaline Phosphatase: 70 U/L (ref 39–117)
BUN: 22 mg/dL (ref 6–23)
Creatinine, Ser: 1.15 mg/dL (ref 0.50–1.35)
Potassium: 4.4 mEq/L (ref 3.5–5.3)
Total Bilirubin: 1 mg/dL (ref 0.3–1.2)

## 2010-12-04 LAB — CBC WITH DIFFERENTIAL/PLATELET
BASO%: 2 % (ref 0.0–2.0)
Eosinophils Absolute: 0.1 10*3/uL (ref 0.0–0.5)
MCV: 92.1 fL (ref 79.3–98.0)
MONO%: 9.1 % (ref 0.0–14.0)
NEUT#: 4.4 10*3/uL (ref 1.5–6.5)
RBC: 5.18 10*6/uL (ref 4.20–5.82)
RDW: 13.6 % (ref 11.0–14.6)
WBC: 6.1 10*3/uL (ref 4.0–10.3)

## 2011-01-13 ENCOUNTER — Encounter (HOSPITAL_BASED_OUTPATIENT_CLINIC_OR_DEPARTMENT_OTHER): Payer: Medicare Other | Admitting: Oncology

## 2011-01-13 ENCOUNTER — Encounter: Payer: Self-pay | Admitting: Nurse Practitioner

## 2011-01-13 ENCOUNTER — Ambulatory Visit (INDEPENDENT_AMBULATORY_CARE_PROVIDER_SITE_OTHER): Payer: Medicare Other | Admitting: Nurse Practitioner

## 2011-01-13 ENCOUNTER — Other Ambulatory Visit (HOSPITAL_COMMUNITY): Payer: Self-pay | Admitting: Oncology

## 2011-01-13 VITALS — BP 138/82 | HR 84 | Ht 70.0 in | Wt 197.8 lb

## 2011-01-13 DIAGNOSIS — R0989 Other specified symptoms and signs involving the circulatory and respiratory systems: Secondary | ICD-10-CM

## 2011-01-13 DIAGNOSIS — C3410 Malignant neoplasm of upper lobe, unspecified bronchus or lung: Secondary | ICD-10-CM

## 2011-01-13 DIAGNOSIS — I499 Cardiac arrhythmia, unspecified: Secondary | ICD-10-CM

## 2011-01-13 DIAGNOSIS — I493 Ventricular premature depolarization: Secondary | ICD-10-CM

## 2011-01-13 DIAGNOSIS — I4949 Other premature depolarization: Secondary | ICD-10-CM

## 2011-01-13 LAB — COMPREHENSIVE METABOLIC PANEL
AST: 25 U/L (ref 0–37)
Alkaline Phosphatase: 77 U/L (ref 39–117)
BUN: 24 mg/dL — ABNORMAL HIGH (ref 6–23)
Calcium: 9.3 mg/dL (ref 8.4–10.5)
Chloride: 108 mEq/L (ref 96–112)
Creatinine, Ser: 1.03 mg/dL (ref 0.50–1.35)

## 2011-01-13 LAB — CBC WITH DIFFERENTIAL/PLATELET
Basophils Absolute: 0 10*3/uL (ref 0.0–0.1)
EOS%: 4 % (ref 0.0–7.0)
HCT: 49.1 % (ref 38.4–49.9)
HGB: 16.5 g/dL (ref 13.0–17.1)
MCH: 31 pg (ref 27.2–33.4)
MCV: 92.1 fL (ref 79.3–98.0)
MONO%: 7.6 % (ref 0.0–14.0)
NEUT%: 74.3 % (ref 39.0–75.0)
Platelets: 197 10*3/uL (ref 140–400)

## 2011-01-13 NOTE — Patient Instructions (Signed)
I think you are doing well from our standpoint.  You are having some PVC's (extra beats). These are not dangerous for you.   Minimize your caffeine intake.  Stay on your current medicines.  If you start having any dizzy or lightheaded spells let me know.  We will see you back as scheduled for January.

## 2011-01-13 NOTE — Assessment & Plan Note (Signed)
He is asymptomatic with the PVC's. He is not exhibiting atrial fib. He feels good and actually looks better than the last time I saw him. We will keep him on his current medicines. We will see him back at his regular appointment time in January. He is to call for any dizziness or lightheadedness. Patient is agreeable to this plan and will call if any problems develop in the interim.

## 2011-01-13 NOTE — Progress Notes (Signed)
[name] Fields Date of Birth: 1935-03-04   History of Present Illness: [name] Fields is seen today for a work in visit. He is seen for Dr. Elease Hashimoto. He is a former patient of Dr. Ronnald Nian. He was [facility] at the [facility] earlier today and saw Dr. Arline Asp. There was concern that he may be in atrial fib. [name] Fields feels great. He has actually gotten a little stronger. He is back walking since the weather has cooled off. He has lost some weight. He is happy with how he is doing. He is not having any chest pain, dizziness or syncope. He is not aware of any palpitations. He does have chronic shortness of breath from his cancer/fibrosis. He is not on oxygen. He is tolerating his medicines. He does not use caffeine. He had a follow up echo for pericardial effusion back in August. He now has minimal fluid. He does have some valvular disease noted with a normal EF.   Current Outpatient Prescriptions on File Prior to Visit  Medication Sig Dispense Refill  . buPROPion (WELLBUTRIN SR) 150 MG 12 hr tablet Take 150 mg by mouth daily.        . clopidogrel (PLAVIX) 75 MG tablet Take 75 mg by mouth daily.        . metoprolol succinate (TOPROL-XL) 25 MG 24 hr tablet Take 12.5 mg by mouth 2 (two) times daily.       . pantoprazole (PROTONIX) 40 MG tablet Take 40 mg by mouth 2 (two) times daily.        . Tamsulosin HCl (FLOMAX) 0.4 MG CAPS Take 0.4 mg by mouth daily.          Allergies  Allergen Reactions  . Sertraline Hcl     Past Medical History  Diagnosis Date  . IHD (ischemic heart disease)     Remote MI in 2001 with stent to the LCX  . Hypertension   . Hyperlipidemia   . SOB (shortness of breath)     Chronic with known fibrosis  . Hypokinesia     MILD INFERIOR  . GERD (gastroesophageal reflux disease)   . Lactose intolerance   . Diverticulosis   . Hearing loss   . Pleural effusion, left   . Pericardial effusion July 2011    s/p pericardial window; last echo in August 2012 with minimal effusion  .  Normal nuclear stress test 2009    EF 63%. No ischemia. 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  . Non-small cell carcinoma of lung     Dx in October of 2010    Past Surgical History  Procedure Date  . Pericardial window July 2011  . Cardiovascular stress test 05/2007    EF 63%  . Coronary stent placement 07/1999    STENT TO THE LEFT CIRCUMFLEX  . Cardiac catheterization 07/11/2003    EF 60%, REPEAT, WHICH SHOWED 30% PROXIMAL NARROWING IN THE RIGHT  CORONARY ARTERY  . Transthoracic echocardiogram 02/22/2010    EF 55-60%    History  Smoking status  . Former Smoker -- 3.0 packs/day for 50 years  . Types: Cigarettes  . Quit date: 04/07/1994  Smokeless tobacco  . Not on file    History  Alcohol Use No    Family History  Problem Relation Age of Onset  . Lung cancer Father   . Heart disease Neg Hx     Review of Systems: The review of systems is positive for chronic shortness of breath.  He is not having any  active treatment for his lung cancer at this time. All other systems were reviewed and are negative.  Physical Exam: BP 138/82  Pulse 84  Ht 5\' 10"  (1.778 m)  Wt 197 lb 12.8 oz (89.721 kg)  BMI 28.38 kg/m2 Patient is very pleasant and in no acute distress. He does look a little thinner. Skin is warm and dry. Color is normal.  HEENT is unremarkable. Normocephalic/atraumatic. PERRL. Sclera are nonicteric. Neck is supple. No masses. No JVD. Lungs are coarse with some scattered wheezes. Cardiac exam shows a regular rate and rhythm. He does have an occasional ectopic. Abdomen is soft. Extremities are without edema. Gait and ROM are intact. No gross neurologic deficits noted.  LABORATORY DATA: EKG shows sinus with PVC's.    Assessment / Plan:

## 2011-01-20 ENCOUNTER — Other Ambulatory Visit: Payer: Self-pay | Admitting: Cardiology

## 2011-03-13 ENCOUNTER — Telehealth: Payer: Self-pay | Admitting: Oncology

## 2011-03-13 NOTE — Telephone Encounter (Signed)
Moved 12/11 appt to 12/7 w/DM due to RJ's absence. Per robin pt to see DM 12/7 @ 1 pm. lmonvm for pt re change w/new d/t for 12/7 @ 12:30 pm. Pt asked to call me directly to confirm. It pt cannot do 12/7 we can change appt but 12/11 appt is cx'd due to RJ's absence.

## 2011-03-14 ENCOUNTER — Other Ambulatory Visit (HOSPITAL_BASED_OUTPATIENT_CLINIC_OR_DEPARTMENT_OTHER): Payer: Medicare Other

## 2011-03-14 ENCOUNTER — Ambulatory Visit (HOSPITAL_COMMUNITY)
Admission: RE | Admit: 2011-03-14 | Discharge: 2011-03-14 | Disposition: A | Discharge status: Home / Self Care | Payer: Medicare Other | Source: Ambulatory Visit | Attending: Oncology | Admitting: Oncology

## 2011-03-14 ENCOUNTER — Other Ambulatory Visit (HOSPITAL_COMMUNITY): Payer: Self-pay | Admitting: Oncology

## 2011-03-14 ENCOUNTER — Ambulatory Visit (HOSPITAL_BASED_OUTPATIENT_CLINIC_OR_DEPARTMENT_OTHER): Payer: Medicare Other | Admitting: Oncology

## 2011-03-14 VITALS — BP 128/80 | HR 76 | Temp 96.9°F | Ht 70.0 in | Wt 195.9 lb

## 2011-03-14 DIAGNOSIS — C3410 Malignant neoplasm of upper lobe, unspecified bronchus or lung: Secondary | ICD-10-CM

## 2011-03-14 DIAGNOSIS — Z85118 Personal history of other malignant neoplasm of bronchus and lung: Secondary | ICD-10-CM | POA: Insufficient documentation

## 2011-03-14 DIAGNOSIS — R0609 Other forms of dyspnea: Secondary | ICD-10-CM

## 2011-03-14 DIAGNOSIS — R0602 Shortness of breath: Secondary | ICD-10-CM | POA: Insufficient documentation

## 2011-03-14 DIAGNOSIS — R0600 Dyspnea, unspecified: Secondary | ICD-10-CM

## 2011-03-14 DIAGNOSIS — C3490 Malignant neoplasm of unspecified part of unspecified bronchus or lung: Secondary | ICD-10-CM

## 2011-03-14 LAB — COMPREHENSIVE METABOLIC PANEL
ALT: 12 U/L (ref 0–53)
Albumin: 4.3 g/dL (ref 3.5–5.2)
BUN: 21 mg/dL (ref 6–23)
CO2: 28 mEq/L (ref 19–32)
Calcium: 9.5 mg/dL (ref 8.4–10.5)
Chloride: 106 mEq/L (ref 96–112)
Creatinine, Ser: 1.02 mg/dL (ref 0.50–1.35)
Potassium: 4.4 mEq/L (ref 3.5–5.3)

## 2011-03-14 LAB — CBC WITH DIFFERENTIAL/PLATELET
BASO%: 1.2 % (ref 0.0–2.0)
EOS%: 4.2 % (ref 0.0–7.0)
HCT: 46.6 % (ref 38.4–49.9)
LYMPH%: 18.4 % (ref 14.0–49.0)
MCH: 30.4 pg (ref 27.2–33.4)
MCHC: 33.1 g/dL (ref 32.0–36.0)
MONO%: 9.2 % (ref 0.0–14.0)
NEUT%: 67 % (ref 39.0–75.0)
Platelets: 193 10*3/uL (ref 140–400)
RBC: 5.08 10*6/uL (ref 4.20–5.82)

## 2011-03-14 LAB — LACTATE DEHYDROGENASE: LDH: 141 U/L (ref 94–250)

## 2011-03-14 MED ORDER — SODIUM CHLORIDE 0.9 % IJ SOLN
10.0000 mL | INTRAMUSCULAR | Status: DC | PRN
  Administered 2011-03-14: 10 mL via INTRAVENOUS
  Filled 2011-03-14: qty 10

## 2011-03-14 MED ORDER — HEPARIN SOD (PORK) LOCK FLUSH 100 UNIT/ML IV SOLN
500.0000 [IU] | Freq: Once | INTRAVENOUS | Status: AC
  Administered 2011-03-14: 500 [IU] via INTRAVENOUS
  Filled 2011-03-14: qty 5

## 2011-03-14 NOTE — Progress Notes (Signed)
CC:   [name] Fields, M.D. [name] Fields. [name] Fields, M.D. [name] Fields, M.D. [name] Fields, M.D. 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  [name] Fields was last here on 01/13/2011.  There has been no significant change in his condition.  He continues to feel fairly well.  He really offers no complaints; specifically, no chest pain or palpitations.  He does have some dyspnea on exertion but that is stable.  There has been no fever.  The patient denies any major changes in his condition.  PROBLEM LIST: 1. Squamous cell carcinoma of the left lung, status post FNA on     02/01/2009, stage T4 N2 M0, stage IIIB, involving left upper lobe     and hilar region.  The patient was treated with weekly carboplatin     and Taxol in combination with radiation treatments, 6,600 cGy in 33     fractions, directed at the left upper lobe and left hilar region     from 02/22/2009 through 04/12/2009.  He then received consolidative     chemotherapy with cisplatin and gemcitabine for 4 cycles from     06/04/2009 through 09/06/2009.  He received Neulasta during those     treatments.  The patient is no longer on any treatment.  His last     PET scan was on 10/12/2009.  His last CT scan of the chest was on     11/26/2010.  His last 2D echocardiogram was on 11/12/2010.  His     last chest x-ray was today without evidence for active cancer. 2. Radiation pneumonitis and pericarditis, status post pericardial     window on 10/25/2009. 3. Hypertension. 4. Dyslipidemia. 5. Depression. 6. Melanoma involving the left face, T2a, status post wide excision on     02/12/2009.  Melanoma in situ involving the chest in February 2007. 7. Bilateral hearing loss. 8. Obstructive uropathy.  MEDICINES: 1. Wellbutrin 150 mg daily. 2. Plavix 75 mg daily. 3.  Lopressor. 4. Prilosec. 5. Protonix. 6. Flomax.  PHYSICAL EXAMINATION:  General Appearance:  The patient looks quite well and is in good spirits.  He is without any complaints today.  Vital Signs:  Weight is 195 pounds and 14.4 ounces.  Height 5 feet and 10 inches.  Body surface area 2.09 m2.  Blood pressure 128/80 in the right arm sitting.  Other vital signs are normal.  HEENT:  There is no scleral icterus.  Mouth and pharynx are benign.  Face is somewhat plethoric. Left side of face is without evidence for recurrent melanoma.  It will be recalled that this was a T3a lesion, 2.2 mm thick resected from the left base on 01/01/2009.  No peripheral adenopathy palpable.  Lungs: There is dullness and decreased breath sounds at the left base with some inspiratory wheezing on the left.  Cardiac Exam:  Regular rhythm.  It will be recalled that on 01/13/2011 the rhythm was irregular.  Abdomen: Benign with no organomegaly or masses palpable.  Extremities:  No peripheral edema or clubbing.  There is a right-sided Port-A-Cath that was flushed with heparin today.  We are maintaining this with heparin flushes every 2 months.  LABORATORY DATA TODAY:  White count 5.5, ANC 3.7, hemoglobin 15.4, hematocrit 46.6, and platelets 193,000.  Chemistries today are essentially unchanged from prior values.  BUN 21, creatinine  1.02, albumin 4.3.  Liver function tests are normal including an LDH of 141. The last CEA was 1.5 on 08/12/2010.  The patient had a chest x-ray today that showed no acute findings and basically stable compared with the prior chest x-ray of 09/23/2010 and CT scan of the chest from 11/26/2010.  Again, the last CT scan of the chest with contrast was on 11/26/2010.  IMPRESSION AND PLAN:  [name] continues to do extraordinarily well, now over 2 years from the time of diagnosis.  He is on no therapy at this time.  We are certainly hoping that his disease has been eradicated.  We will  plan to see him every 2 months.  He will need a port flush at that time.  He had a port flush today.  We will check CBC and chemistries.  In April, we will probably repeat the CT scan of the chest.    ______________________________ Samul Dada, M.D. DSM/MEDQ  D:  03/14/2011  T:  03/14/2011  Job:  [id number]

## 2011-03-14 NOTE — Progress Notes (Signed)
This office note has been dictated.  #[id number]

## 2011-03-18 ENCOUNTER — Other Ambulatory Visit: Payer: Medicare Other | Admitting: Lab

## 2011-03-18 ENCOUNTER — Ambulatory Visit: Payer: Medicare Other | Admitting: Physician Assistant

## 2011-04-26 ENCOUNTER — Telehealth: Payer: Self-pay

## 2011-04-26 NOTE — Telephone Encounter (Signed)
l/m and mailed appt info for 2/20   aom

## 2011-05-19 ENCOUNTER — Ambulatory Visit (INDEPENDENT_AMBULATORY_CARE_PROVIDER_SITE_OTHER): Payer: Medicare Other | Admitting: Cardiovascular Disease

## 2011-05-19 ENCOUNTER — Encounter: Payer: Self-pay | Admitting: Cardiovascular Disease

## 2011-05-19 VITALS — BP 117/70 | HR 60 | Ht 70.0 in | Wt 191.8 lb

## 2011-05-19 DIAGNOSIS — I2510 Atherosclerotic heart disease of native coronary artery without angina pectoris: Secondary | ICD-10-CM

## 2011-05-19 NOTE — Progress Notes (Signed)
[name] Fields Date of Birth  January 17, 1935 [facility]     [place] [facility]  [address]    [address]   [address] [place], [place]  [address]    [place], [place]  [address] [phone]  Fax  [phone]  [phone]  Fax [phone]  Problem List: 1. Coronary artery disease- status post stenting of the left circumflex artery in April, 2001 2. Hyperlipidemia 3. Hypertension 4. Lung cancer with pericardial effusion-status post pericardial window   History of Present Illness:  He is doing well from a carciac standpoint.  He has chronic dyspnea because of scarring on his left lung. He had radiation and chemotherapy after having a left lung cancer.  He denies any angina.  He is having trouble sleeping.  Current Outpatient Prescriptions on File Prior to Visit  Medication Sig Dispense Refill  . buPROPion (WELLBUTRIN SR) 150 MG 12 hr tablet Take 150 mg by mouth daily.        . clopidogrel (PLAVIX) 75 MG tablet TAKE 1 TABLET DAILY  90 tablet  3  . metoprolol tartrate (LOPRESSOR) 25 MG tablet TAKE ONE-HALF (1/2) TABLET  TWICE A DAY  90 tablet  3  . omeprazole (PRILOSEC) 20 MG capsule Take 20 mg by mouth daily.        . pantoprazole (PROTONIX) 40 MG tablet Take 40 mg by mouth 2 (two) times daily.        . Tamsulosin HCl (FLOMAX) 0.4 MG CAPS Take 0.4 mg by mouth daily.          Allergies  Allergen Reactions  . Sertraline Hcl     Past Medical History  Diagnosis Date  . IHD (ischemic heart disease)     Remote MI in 2001 with stent to the LCX  . Hypertension   . Hyperlipidemia   . SOB (shortness of breath)     Chronic with known fibrosis  . Hypokinesia     MILD INFERIOR  . GERD (gastroesophageal reflux disease)   . Lactose intolerance   . Diverticulosis   . Hearing loss   . Pleural effusion, left   . Pericardial effusion July 2011    s/p pericardial window; last echo in August 2012 with minimal effusion  . Normal nuclear stress test 2009    EF 63%.  No ischemia. 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  . Non-small cell carcinoma of lung     Dx in October of 2010    Past Surgical History  Procedure Date  . Pericardial window July 2011  . Cardiovascular stress test 05/2007    EF 63%  . Coronary stent placement 07/1999    STENT TO THE LEFT CIRCUMFLEX  . Cardiac catheterization 07/11/2003    EF 60%, REPEAT, WHICH SHOWED 30% PROXIMAL NARROWING IN THE RIGHT  CORONARY ARTERY  . Transthoracic echocardiogram 02/22/2010    EF 55-60%    History  Smoking status  . Former Smoker -- 3.0 packs/day for 50 years  . Types: Cigarettes  . Quit date: 04/07/1994  Smokeless tobacco  . Not on file    History  Alcohol Use No    Family History  Problem Relation Age of Onset  . Lung cancer Father   . Heart disease Neg Hx     Reviw of Systems:  Reviewed in the HPI.  All other systems are negative.  Physical Exam: Blood pressure 117/70, pulse 60, height 5\' 10"  (1.778 m), weight 191 lb 12.8 oz (87 kg). General: Well developed,  well nourished, in no acute distress.  Head: Normocephalic, atraumatic, sclera non-icteric, mucus membranes are moist,   Neck: Supple. Negative for carotid bruits. JVD not elevated.  Lungs: Clear bilaterally to auscultation without wheezes, rales, or rhonchi. Breathing is unlabored.  Heart: RRR with S1 S2. No murmurs, rubs, or gallops appreciated.  Abdomen: Soft, non-tender, non-distended with normoactive bowel sounds. No hepatomegaly. No rebound/guarding. No obvious abdominal masses.  Msk:  Strength and tone appear normal for age.  Extremities: No clubbing or cyanosis. No edema.  Distal pedal pulses are 2+ and equal bilaterally.  Neuro: Alert and oriented X 3. Moves all extremities spontaneously.  Psych:  Responds to questions appropriately with a normal affect.  ECG:  Assessment / Plan:

## 2011-05-19 NOTE — Patient Instructions (Signed)
Your physician wants you to follow-up in: 6 MONTHS You will receive a reminder letter in the mail two months in advance. If you don't receive a letter, please call our office to schedule the follow-up appointment.  Your physician recommends that you return for a FASTING lipid profile: 6 MONTHS    

## 2011-05-19 NOTE — Assessment & Plan Note (Signed)
[name] Fields is doing well. He's not having episodes of angina. He is chronically short of breath but does because of the radiation to his lungs. I'll see him again in 6 months for an office visit and fasting labs.

## 2011-05-22 ENCOUNTER — Other Ambulatory Visit: Payer: Medicare Other | Admitting: Lab

## 2011-05-22 ENCOUNTER — Other Ambulatory Visit: Payer: Self-pay | Admitting: *Deleted

## 2011-05-22 ENCOUNTER — Ambulatory Visit: Payer: Medicare Other | Admitting: Oncology

## 2011-05-22 DIAGNOSIS — C439 Malignant melanoma of skin, unspecified: Secondary | ICD-10-CM

## 2011-05-22 MED ORDER — PANTOPRAZOLE SODIUM 40 MG PO TBEC: 40.0000 mg | DELAYED_RELEASE_TABLET | Freq: Two times a day (BID) | ORAL | Status: DC

## 2011-05-26 ENCOUNTER — Ambulatory Visit: Payer: Medicare Other | Admitting: Pulmonary Disease

## 2011-05-28 ENCOUNTER — Telehealth: Payer: Self-pay | Admitting: Oncology

## 2011-05-28 ENCOUNTER — Encounter: Payer: Self-pay | Admitting: Medical Oncology

## 2011-05-28 ENCOUNTER — Other Ambulatory Visit: Payer: Medicare Other | Admitting: Lab

## 2011-05-28 ENCOUNTER — Other Ambulatory Visit: Payer: Self-pay | Admitting: Medical Oncology

## 2011-05-28 ENCOUNTER — Encounter: Payer: Self-pay | Admitting: Oncology

## 2011-05-28 ENCOUNTER — Ambulatory Visit (HOSPITAL_BASED_OUTPATIENT_CLINIC_OR_DEPARTMENT_OTHER): Payer: Medicare Other | Admitting: Oncology

## 2011-05-28 VITALS — BP 113/75 | HR 63 | Temp 96.3°F | Ht 70.0 in | Wt 196.5 lb

## 2011-05-28 DIAGNOSIS — C3490 Malignant neoplasm of unspecified part of unspecified bronchus or lung: Secondary | ICD-10-CM

## 2011-05-28 LAB — COMPREHENSIVE METABOLIC PANEL
AST: 19 U/L (ref 0–37)
Albumin: 4.3 g/dL (ref 3.5–5.2)
BUN: 18 mg/dL (ref 6–23)
Calcium: 9.4 mg/dL (ref 8.4–10.5)
Chloride: 105 mEq/L (ref 96–112)
Creatinine, Ser: 0.98 mg/dL (ref 0.50–1.35)
Glucose, Bld: 85 mg/dL (ref 70–99)
Potassium: 4.5 mEq/L (ref 3.5–5.3)

## 2011-05-28 LAB — CBC WITH DIFFERENTIAL/PLATELET
Basophils Absolute: 0 10*3/uL (ref 0.0–0.1)
EOS%: 2.8 % (ref 0.0–7.0)
Eosinophils Absolute: 0.1 10*3/uL (ref 0.0–0.5)
HCT: 46.6 % (ref 38.4–49.9)
HGB: 15.7 g/dL (ref 13.0–17.1)
MONO#: 0.4 10*3/uL (ref 0.1–0.9)
NEUT#: 3.7 10*3/uL (ref 1.5–6.5)
NEUT%: 70 % (ref 39.0–75.0)
RDW: 13.7 % (ref 11.0–14.6)
WBC: 5.3 10*3/uL (ref 4.0–10.3)
lymph#: 1 10*3/uL (ref 0.9–3.3)

## 2011-05-28 MED ORDER — TEMAZEPAM 15 MG PO CAPS: 15.0000 mg | ORAL_CAPSULE | Freq: Every evening | ORAL | Status: DC | PRN

## 2011-05-28 MED ORDER — HEPARIN SOD (PORK) LOCK FLUSH 100 UNIT/ML IV SOLN
500.0000 [IU] | Freq: Once | INTRAVENOUS | Status: AC
  Administered 2011-05-28: 500 [IU] via INTRAVENOUS
  Filled 2011-05-28: qty 5

## 2011-05-28 MED ORDER — SODIUM CHLORIDE 0.9 % IJ SOLN
10.0000 mL | INTRAMUSCULAR | Status: DC | PRN
  Administered 2011-05-28: 10 mL via INTRAVENOUS
  Filled 2011-05-28: qty 10

## 2011-05-28 NOTE — Progress Notes (Signed)
This office note has been dictated.  #[id number]

## 2011-05-28 NOTE — Telephone Encounter (Signed)
Gv pt apt for april-june2013. scheduled ct scan for 04/15 @ [facility]

## 2011-05-28 NOTE — Progress Notes (Signed)
CC:   [name], M.D. [name]. [name], M.D. [name], M.D. [name], M.D. [name], MD,FCCP     PROBLEM LIST:  1. Squamous cell carcinoma of the left lung, status post FNA on  02/01/2009, stage T4 N2 M0, stage IIIB, involving left upper lobe  and hilar region. The patient was treated with weekly carboplatin  and Taxol in combination with radiation treatments, 6,600 cGy in 33  fractions, directed at the left upper lobe and left hilar region  from 02/22/2009 through 04/12/2009. He then received consolidative  chemotherapy with cisplatin and gemcitabine for 4 cycles from  06/04/2009 through 09/06/2009. He received Neulasta during those  treatments. The patient is no longer on any treatment. His last  PET scan was on 10/12/2009. His last CT scan of the chest was on  11/26/2010. His last 2D echocardiogram was on 11/12/2010. His  last chest x-ray was today without evidence for active cancer.  2. Radiation pneumonitis and pericarditis, status post pericardial  window on 10/25/2009.  3. Hypertension.  4. Dyslipidemia.  5. Depression.  6. Melanoma involving the left face, T3a, status post wide excision on  02/12/2009. Melanoma in situ involving the chest in February 2007.  7. Bilateral hearing loss.  8. Obstructive uropathy. 9. Right Port-A-Cath placed on 07/11/2009.   MEDICATIONS: 1. Wellbutrin 150 mg daily. 2. Plavix 75 mg daily. 3. Lopressor 12.5 mg twice a day. 4. Protonix 40 mg twice a day. 5. Flomax 0.4 mg daily. 6. Restoril 15-30 mg h.s.  The patient had been on both Prilosec and Protonix.  I recommended that he discontinue the Prilosec and continue with Protonix.  We are giving him a prescription for Restoril today.   HISTORY:  Anselm Aumiller was seen today for followup of his squamous cell carcinoma of the left lung, stage IIIB, with diagnosis going back to late October 2010.  Mr. Gatley, who is now 77 years old, was last seen by  [place] on 03/14/2011.  There has been no significant change in his condition.  He remains without any symptoms to suggest recurrent lung cancer.  He feels generally well.  His main complaint is trouble sleeping.  We are giving him Restoril today.  PHYSICAL EXAMINATION:  Mr. Dorsey looks well.  He is in good spirits as usual.  O2 saturation on room air at rest was 97%.  Weight 196 pounds 8 ounces.  Height 5 feet 10 inches.  Body surface area 2.1 sq. m.  Blood pressure 113/75.  Other vital signs are normal.  There is no scleral icterus.  Mouth and pharynx are benign.  Left side of face is without evidence for recurrent melanoma.  Neck:  Without peripheral adenopathy. No axillary, supraclavicular or inguinal adenopathy.  There is dullness and decreased breath sounds at the left base, otherwise lungs are clear without any wheezing or rhonchi.  Cardiac:  Regular rhythm without murmur or rub.  Abdomen:  Benign with no organomegaly or masses palpable.  There is a right-sided Port-A-Cath that was flushed with heparin today.  This was placed on 07/11/2009.  Extremities:  No peripheral edema or clubbing.  Neurologic:  Grossly normal.  LABORATORY DATA:  Today white count 5.3, ANC 3.7, hemoglobin 15.7, hematocrit 46.6, platelets 190,000.  Chemistries today are pending. Chemistries from 03/14/2011 were entirely normal.  CEA from 08/12/2010 was 1.5.  IMAGING STUDIES: 1. MRI of the head with and without IV contrast from 09/30/2010 was     negative for metastatic disease to  the brain.  There were no acute     abnormalities. 2. CT scan of the chest with IV contrast from 11/26/2010 showed stable     postsurgical changes in the left hemithorax.  There was a stable     cluster of central pulmonary nodules within the right lower lobe.     There is volume loss in the left upper lobe.  There is     paramediastinal atelectasis similar to the prior CT scan of     05/03/2012and a prior PET scan from  01/04/2010. 3. Chest x-ray 2-view from 03/14/2011 showed no acute findings.  There     were stable findings compared with prior chest x-ray from     09/23/2010.  IMPRESSION AND PLAN:  [name] continues to do extraordinarily well, now over 2 years from the time of diagnosis.  His Port-A-Cath was flushed today.  We will continue to maintain this with heparin flushes every 2 months.  He is on no therapy at this time.  We are giving him a prescription for Restoril to take 15-30 mg at bedtime.  We have told him that he can discontinue the Prilosec, continue with the Protonix at 40 mg twice a day.  We will plan to obtain a CT scan of the chest with IV contrast in 2 months.  Port-A-Cath can be flushed at that time.  We will then plan to see Mr. Skidmore again in 4 months, which will be mid June, at which time he will have a CBC and chemistries as well as a port flush.    ______________________________ Samul Dada, M.D. DSM/MEDQ  D:  05/28/2011  T:  05/28/2011  Job:  [id number]

## 2011-07-07 DIAGNOSIS — J441 Chronic obstructive pulmonary disease with (acute) exacerbation: Secondary | ICD-10-CM | POA: Diagnosis not present

## 2011-07-16 DIAGNOSIS — J441 Chronic obstructive pulmonary disease with (acute) exacerbation: Secondary | ICD-10-CM | POA: Diagnosis not present

## 2011-07-21 ENCOUNTER — Encounter (HOSPITAL_COMMUNITY): Payer: Self-pay

## 2011-07-21 ENCOUNTER — Other Ambulatory Visit (HOSPITAL_BASED_OUTPATIENT_CLINIC_OR_DEPARTMENT_OTHER): Payer: Medicare Other | Admitting: Lab

## 2011-07-21 ENCOUNTER — Ambulatory Visit (HOSPITAL_BASED_OUTPATIENT_CLINIC_OR_DEPARTMENT_OTHER): Payer: Medicare Other

## 2011-07-21 ENCOUNTER — Ambulatory Visit (HOSPITAL_COMMUNITY)
Admission: RE | Admit: 2011-07-21 | Discharge: 2011-07-21 | Disposition: A | Discharge status: Home / Self Care | Payer: Medicare Other | Source: Ambulatory Visit | Attending: Oncology | Admitting: Oncology

## 2011-07-21 VITALS — BP 106/63 | HR 63 | Temp 97.7°F

## 2011-07-21 DIAGNOSIS — Y842 Radiological procedure and radiotherapy as the cause of abnormal reaction of the patient, or of later complication, without mention of misadventure at the time of the procedure: Secondary | ICD-10-CM | POA: Insufficient documentation

## 2011-07-21 DIAGNOSIS — Z452 Encounter for adjustment and management of vascular access device: Secondary | ICD-10-CM | POA: Diagnosis not present

## 2011-07-21 DIAGNOSIS — J984 Other disorders of lung: Secondary | ICD-10-CM | POA: Diagnosis not present

## 2011-07-21 DIAGNOSIS — N289 Disorder of kidney and ureter, unspecified: Secondary | ICD-10-CM | POA: Diagnosis not present

## 2011-07-21 DIAGNOSIS — J90 Pleural effusion, not elsewhere classified: Secondary | ICD-10-CM | POA: Insufficient documentation

## 2011-07-21 DIAGNOSIS — K7689 Other specified diseases of liver: Secondary | ICD-10-CM | POA: Insufficient documentation

## 2011-07-21 DIAGNOSIS — R05 Cough: Secondary | ICD-10-CM | POA: Insufficient documentation

## 2011-07-21 DIAGNOSIS — C3490 Malignant neoplasm of unspecified part of unspecified bronchus or lung: Secondary | ICD-10-CM

## 2011-07-21 DIAGNOSIS — Z79899 Other long term (current) drug therapy: Secondary | ICD-10-CM | POA: Diagnosis not present

## 2011-07-21 DIAGNOSIS — K439 Ventral hernia without obstruction or gangrene: Secondary | ICD-10-CM | POA: Insufficient documentation

## 2011-07-21 DIAGNOSIS — R918 Other nonspecific abnormal finding of lung field: Secondary | ICD-10-CM | POA: Diagnosis not present

## 2011-07-21 DIAGNOSIS — J701 Chronic and other pulmonary manifestations due to radiation: Secondary | ICD-10-CM | POA: Insufficient documentation

## 2011-07-21 DIAGNOSIS — R059 Cough, unspecified: Secondary | ICD-10-CM | POA: Diagnosis not present

## 2011-07-21 DIAGNOSIS — R911 Solitary pulmonary nodule: Secondary | ICD-10-CM | POA: Insufficient documentation

## 2011-07-21 DIAGNOSIS — C3410 Malignant neoplasm of upper lobe, unspecified bronchus or lung: Secondary | ICD-10-CM

## 2011-07-21 DIAGNOSIS — I517 Cardiomegaly: Secondary | ICD-10-CM | POA: Diagnosis not present

## 2011-07-21 LAB — COMPREHENSIVE METABOLIC PANEL
AST: 22 U/L (ref 0–37)
Albumin: 3.6 g/dL (ref 3.5–5.2)
Alkaline Phosphatase: 71 U/L (ref 39–117)
Potassium: 4.1 mEq/L (ref 3.5–5.3)
Sodium: 137 mEq/L (ref 135–145)
Total Bilirubin: 1.1 mg/dL (ref 0.3–1.2)
Total Protein: 6.5 g/dL (ref 6.0–8.3)

## 2011-07-21 MED ORDER — IOHEXOL 300 MG/ML  SOLN
80.0000 mL | Freq: Once | INTRAMUSCULAR | Status: AC | PRN
  Administered 2011-07-21: 80 mL via INTRAVENOUS

## 2011-07-21 MED ORDER — HEPARIN SOD (PORK) LOCK FLUSH 100 UNIT/ML IV SOLN
250.0000 [IU] | INTRAVENOUS | Status: DC | PRN
  Filled 2011-07-21: qty 5

## 2011-07-21 MED ORDER — HEPARIN SOD (PORK) LOCK FLUSH 100 UNIT/ML IV SOLN
500.0000 [IU] | INTRAVENOUS | Status: AC | PRN
  Administered 2011-07-21: 500 [IU]
  Filled 2011-07-21: qty 5

## 2011-07-21 MED ORDER — SODIUM CHLORIDE 0.9 % IJ SOLN
10.0000 mL | INTRAMUSCULAR | Status: DC | PRN
  Filled 2011-07-21: qty 10

## 2011-07-21 MED ORDER — SODIUM CHLORIDE 0.9 % IJ SOLN
10.0000 mL | INTRAMUSCULAR | Status: AC | PRN
  Administered 2011-07-21: 10 mL
  Filled 2011-07-21: qty 10

## 2011-07-22 ENCOUNTER — Other Ambulatory Visit: Payer: Self-pay | Admitting: Oncology

## 2011-07-22 ENCOUNTER — Ambulatory Visit (HOSPITAL_COMMUNITY)
Admission: RE | Admit: 2011-07-22 | Discharge: 2011-07-22 | Disposition: A | Discharge status: Home / Self Care | Payer: Medicare Other | Source: Ambulatory Visit | Attending: Oncology | Admitting: Oncology

## 2011-07-22 DIAGNOSIS — C3490 Malignant neoplasm of unspecified part of unspecified bronchus or lung: Secondary | ICD-10-CM

## 2011-07-23 ENCOUNTER — Other Ambulatory Visit: Payer: Self-pay | Admitting: Oncology

## 2011-07-23 ENCOUNTER — Encounter: Payer: Self-pay | Admitting: Oncology

## 2011-07-23 DIAGNOSIS — C3410 Malignant neoplasm of upper lobe, unspecified bronchus or lung: Secondary | ICD-10-CM

## 2011-07-23 NOTE — Progress Notes (Signed)
CT chest from 4/15 showed an evolving right mid lung nodule.  I reviewed the scan with radiologist.  To obtain PET scan about 4/15.

## 2011-07-24 ENCOUNTER — Other Ambulatory Visit: Payer: Self-pay

## 2011-07-24 ENCOUNTER — Telehealth: Payer: Self-pay

## 2011-07-24 NOTE — Telephone Encounter (Signed)
S/w pt about CT result and PET next week. Pt stated he is going to beach and will call next Wed to schedule.

## 2011-07-24 NOTE — Telephone Encounter (Signed)
lvm for pt to call. We have CT results of R lung nodule. Pt to have PET scheduled sometime next week.

## 2011-07-25 ENCOUNTER — Telehealth: Payer: Self-pay | Admitting: Oncology

## 2011-07-25 NOTE — Telephone Encounter (Signed)
called pt with 4.30 pet appt

## 2011-08-05 ENCOUNTER — Encounter (HOSPITAL_COMMUNITY)
Admission: RE | Admit: 2011-08-05 | Discharge: 2011-08-05 | Disposition: A | Discharge status: Home / Self Care | Payer: Medicare Other | Source: Ambulatory Visit | Attending: Oncology | Admitting: Oncology

## 2011-08-05 ENCOUNTER — Encounter (HOSPITAL_COMMUNITY): Payer: Self-pay

## 2011-08-05 DIAGNOSIS — C3410 Malignant neoplasm of upper lobe, unspecified bronchus or lung: Secondary | ICD-10-CM | POA: Diagnosis not present

## 2011-08-05 DIAGNOSIS — C3490 Malignant neoplasm of unspecified part of unspecified bronchus or lung: Secondary | ICD-10-CM | POA: Diagnosis not present

## 2011-08-05 DIAGNOSIS — R222 Localized swelling, mass and lump, trunk: Secondary | ICD-10-CM | POA: Insufficient documentation

## 2011-08-05 DIAGNOSIS — I319 Disease of pericardium, unspecified: Secondary | ICD-10-CM | POA: Insufficient documentation

## 2011-08-05 MED ORDER — FLUDEOXYGLUCOSE F - 18 (FDG) INJECTION
19.9000 | Freq: Once | INTRAVENOUS | Status: AC | PRN
  Administered 2011-08-05: 19.9 via INTRAVENOUS

## 2011-08-07 ENCOUNTER — Telehealth: Payer: Self-pay | Admitting: Oncology

## 2011-08-07 ENCOUNTER — Telehealth: Payer: Self-pay

## 2011-08-07 ENCOUNTER — Encounter: Payer: Self-pay | Admitting: Oncology

## 2011-08-07 ENCOUNTER — Other Ambulatory Visit: Payer: Self-pay

## 2011-08-07 DIAGNOSIS — C3410 Malignant neoplasm of upper lobe, unspecified bronchus or lung: Secondary | ICD-10-CM

## 2011-08-07 NOTE — Telephone Encounter (Signed)
l/m with 5/13 appt for byrum   aom

## 2011-08-07 NOTE — Telephone Encounter (Signed)
S/w patient that per DSM there is a positive CT and PET scan for a spot on the lung. We put in a referral to Dr Delton Coombes at [facility] and he should be expecting a call from them. Pt expressed disappointment about the news but understanding.

## 2011-08-07 NOTE — Progress Notes (Signed)
CT chest and PET scans reviewed.  Right mid lung field lesion has developed recently.  Referral to Dr. Levy Pupa made for consideration of FOB and directed biopsy.

## 2011-08-08 ENCOUNTER — Other Ambulatory Visit: Payer: Self-pay | Admitting: *Deleted

## 2011-08-08 DIAGNOSIS — C439 Malignant melanoma of skin, unspecified: Secondary | ICD-10-CM

## 2011-08-08 MED ORDER — PANTOPRAZOLE SODIUM 40 MG PO TBEC: 40.0000 mg | DELAYED_RELEASE_TABLET | Freq: Two times a day (BID) | ORAL | Status: DC

## 2011-08-18 ENCOUNTER — Encounter: Payer: Self-pay | Admitting: Emergency Medicine

## 2011-08-18 ENCOUNTER — Ambulatory Visit (INDEPENDENT_AMBULATORY_CARE_PROVIDER_SITE_OTHER): Payer: Medicare Other | Admitting: Emergency Medicine

## 2011-08-18 VITALS — BP 110/80 | HR 58 | Temp 98.0°F | Ht 70.0 in | Wt 198.2 lb

## 2011-08-18 DIAGNOSIS — C3410 Malignant neoplasm of upper lobe, unspecified bronchus or lung: Secondary | ICD-10-CM

## 2011-08-18 NOTE — Progress Notes (Signed)
Subjective:    Patient ID: [name] Fields, male    DOB: Dec 05, 1934, 77 y.o.   MRN: [id number]  HPI 77 yo man, former smoker, hx squamous cell lung CA, IIIB s/p chemo + XRT. He was doing well and scans were reassuring until most recent scan 07/21/11 showed new R mid-lung nodule 1x1cm. PET scan on 4/30 showed hypermetabolic activity. He has had paroxysmal cough x 4 months.  Non-productive. He does have some PND, especially when out walking. No hemoptysis. He denies any GERD sx. The PND is longstanding but the cough is fairly new.  He is referred for possible biopsy    Review of Systems  Constitutional: Negative for fever and unexpected weight change.  HENT: Positive for sneezing. Negative for ear pain, nosebleeds, congestion, sore throat, rhinorrhea, trouble swallowing, dental problem, postnasal drip and sinus pressure.   Eyes: Positive for itching. Negative for redness.  Respiratory: Positive for cough and shortness of breath. Negative for chest tightness and wheezing.   Cardiovascular: Negative for palpitations and leg swelling.  Gastrointestinal: Negative for nausea and vomiting.  Genitourinary: Negative for dysuria.  Musculoskeletal: Negative for joint swelling.  Skin: Negative for rash.  Neurological: Negative for headaches.  Hematological: Bruises/bleeds easily.  Psychiatric/Behavioral: Negative for dysphoric mood. The patient is not nervous/anxious.     Past Medical History  Diagnosis Date  . IHD (ischemic heart disease)     Remote MI in 2001 with stent to the LCX  . Hypertension   . Hyperlipidemia   . SOB (shortness of breath)     Chronic with known fibrosis  . Hypokinesia     MILD INFERIOR  . GERD (gastroesophageal reflux disease)   . Lactose intolerance   . Diverticulosis   . Hearing loss   . Pleural effusion, left   . Pericardial effusion July 2011    s/p pericardial window; last echo in August 2012 with minimal effusion  . Normal nuclear stress test 2009    EF  63%. No ischemia. 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  . Non-small cell carcinoma of lung     Dx in October of 2010     Family History  Problem Relation Age of Onset  . Lung cancer Father   . Heart disease Neg Hx      History   Social History  . Marital Status: Single    Spouse Name: N/A    Number of Children: N/A  . Years of Education: N/A   Occupational History  . retired     [employer]   Social History Main Topics  . Smoking status: Former Smoker -- 3.0 packs/day for 40 years    Types: Cigarettes    Quit date: 04/07/1994  . Smokeless tobacco: Never Used  . Alcohol Use: No  . Drug Use: No  . Sexually Active: No   Other Topics Concern  . Not on file   Social History Narrative  . No narrative on file     Allergies  Allergen Reactions  . Sertraline Hcl      Outpatient Prescriptions Prior to Visit  Medication Sig Dispense Refill  . buPROPion (WELLBUTRIN SR) 150 MG 12 hr tablet Take 150 mg by mouth daily.        . clopidogrel (PLAVIX) 75 MG tablet TAKE 1 TABLET DAILY  90 tablet  3  . metoprolol tartrate (LOPRESSOR) 25 MG tablet TAKE ONE-HALF (1/2) TABLET  TWICE A DAY  90 tablet  3  . pantoprazole (PROTONIX) 40 MG tablet Take 1 tablet (  40 mg total) by mouth 2 (two) times daily.  60 tablet  1  . Tamsulosin HCl (FLOMAX) 0.4 MG CAPS Take 0.4 mg by mouth daily.        . temazepam (RESTORIL) 15 MG capsule Take 1 capsule (15 mg total) by mouth at bedtime as needed. May repeat x one  60 capsule  2  . omeprazole (PRILOSEC) 20 MG capsule Take 20 mg by mouth daily.              Objective:   Physical Exam Filed Vitals:   08/18/11 1414  BP: 110/80  Pulse: 58  Temp: 98 F (36.7 C)    Gen: Pleasant, well-nourished, in no distress,  normal affect  ENT: No lesions,  mouth clear,  oropharynx clear, no postnasal drip  Neck: No JVD, no TMG, no carotid bruits  Lungs: No use of accessory muscles, no dullness to percussion, clear without rales or rhonchi  Cardiovascular: RRR, heart sounds  normal, no murmur or gallops, no peripheral edema  Musculoskeletal: No deformities, no cyanosis or clubbing  Neuro: alert, non focal  Skin: Warm, no lesions or rashes   PET scan 08/05/11 --  Comparison: CT 07/21/2011, PET CT 01/04/2010  Findings:  Neck: No hypermetabolic nodes in the neck.  Chest: 10 mm right lower lobe nodule (image 109) has moderate F D G  activity with SUV max = 3.3. This is lesion of concern on  comparison CT. No evidence of hypermetabolic mediastinal or hilar  lymph nodes.  There is a post therapy change in the left hemithorax with pleural  fluid and volume loss. Small pericardial effusion. The  Abdomen / Pelvis:No abnormal hypermetabolic activity within the  solid organs. No evidence of abdominal or pelvic hypermetabolic  nodes.  Skeleton:No focal hypermetabolic activity to suggest skeletal  metastasis.  IMPRESSION:  1. Hypermetabolic right lower lobe nodule is concerning for  primary bronchogenic carcinoma. Recommend biopsy versus resection.  2. No evidence of mediastinal metastasis.  3. Postoperative and post therapy change in the left hemithorax  with pleural fluid and small pericardial effusion.      Assessment & Plan:  MALIGNANT NEOPLASM UPPER LOBE BRONCHUS OR LUNG Hx IIIB squamous cell CA, now with new RLL nodule concerning for recurrence.  - will work on setting up FOB + ENB to get tissue dx. He prefers next week to this week - will hold Plavix beginning on 08/22/11 - rov with myself and Dr Arline Asp to review data and plan next steps

## 2011-08-18 NOTE — Patient Instructions (Signed)
We will arrange for a lung biopsy with bronchoscopy on 08/27/11.  Please stop your plavix on 08/22/11 Make a follow up appointment with Dr Delton Coombes for 2 weeks from now.  We will forward this information to Dr Arline Asp

## 2011-08-18 NOTE — Assessment & Plan Note (Signed)
Hx IIIB squamous cell CA, now with new RLL nodule concerning for recurrence.  - will work on setting up FOB + ENB to get tissue dx. He prefers next week to this week - will hold Plavix beginning on 08/22/11 - rov with myself and Dr Arline Asp to review data and plan next steps

## 2011-08-18 NOTE — Progress Notes (Signed)
Addended by: [name] on: 08/18/2011 02:58 PM   Modules accepted: Orders

## 2011-08-19 ENCOUNTER — Encounter (HOSPITAL_COMMUNITY): Payer: Self-pay

## 2011-08-19 ENCOUNTER — Encounter (HOSPITAL_COMMUNITY): Payer: Self-pay | Admitting: Pharmacy Technician

## 2011-08-19 ENCOUNTER — Encounter (HOSPITAL_COMMUNITY)
Admission: RE | Admit: 2011-08-19 | Discharge: 2011-08-19 | Disposition: A | Discharge status: Home / Self Care | Payer: Medicare Other | Source: Ambulatory Visit | Attending: Emergency Medicine | Admitting: Emergency Medicine

## 2011-08-19 DIAGNOSIS — F411 Generalized anxiety disorder: Secondary | ICD-10-CM | POA: Diagnosis not present

## 2011-08-19 DIAGNOSIS — I252 Old myocardial infarction: Secondary | ICD-10-CM | POA: Diagnosis not present

## 2011-08-19 DIAGNOSIS — I2510 Atherosclerotic heart disease of native coronary artery without angina pectoris: Secondary | ICD-10-CM | POA: Diagnosis not present

## 2011-08-19 DIAGNOSIS — Z85118 Personal history of other malignant neoplasm of bronchus and lung: Secondary | ICD-10-CM | POA: Diagnosis not present

## 2011-08-19 DIAGNOSIS — K219 Gastro-esophageal reflux disease without esophagitis: Secondary | ICD-10-CM | POA: Diagnosis not present

## 2011-08-19 DIAGNOSIS — I10 Essential (primary) hypertension: Secondary | ICD-10-CM | POA: Diagnosis not present

## 2011-08-19 DIAGNOSIS — Z9861 Coronary angioplasty status: Secondary | ICD-10-CM | POA: Diagnosis not present

## 2011-08-19 DIAGNOSIS — J449 Chronic obstructive pulmonary disease, unspecified: Secondary | ICD-10-CM | POA: Diagnosis not present

## 2011-08-19 DIAGNOSIS — C3430 Malignant neoplasm of lower lobe, unspecified bronchus or lung: Secondary | ICD-10-CM | POA: Diagnosis not present

## 2011-08-19 HISTORY — DX: Unspecified osteoarthritis, unspecified site: M19.90

## 2011-08-19 HISTORY — DX: Anxiety disorder, unspecified: F41.9

## 2011-08-19 LAB — COMPREHENSIVE METABOLIC PANEL
ALT: 18 U/L (ref 0–53)
AST: 27 U/L (ref 0–37)
Albumin: 3.8 g/dL (ref 3.5–5.2)
Alkaline Phosphatase: 83 U/L (ref 39–117)
CO2: 29 mEq/L (ref 19–32)
Chloride: 103 mEq/L (ref 96–112)
Creatinine, Ser: 0.93 mg/dL (ref 0.50–1.35)
GFR calc non Af Amer: 81 mL/min — ABNORMAL LOW (ref >90)
Potassium: 3.9 mEq/L (ref 3.5–5.1)
Sodium: 141 mEq/L (ref 135–145)
Total Bilirubin: 0.5 mg/dL (ref 0.3–1.2)

## 2011-08-19 LAB — APTT: aPTT: 29 seconds (ref 24–37)

## 2011-08-19 LAB — CBC
MCV: 90.8 fL (ref 78.0–100.0)
Platelets: 219 10*3/uL (ref 150–400)
RBC: 5.09 MIL/uL (ref 4.22–5.81)
WBC: 7.1 10*3/uL (ref 4.0–10.5)

## 2011-08-19 NOTE — Pre-Procedure Instructions (Signed)
20 [name] Fields  08/19/2011   Your procedure is scheduled on:  08/27/2011  Report to [facility] at 6:30 AM.  Call this number if you have problems the morning of surgery: [phone]   Remember:   Do not eat food:After Midnight.  TUESDAY  May have clear liquids:until Midnight .  NOTHING AFTER 2:30p.m.  Clear liquids include soda, tea, black coffee, apple or grape juice, broth.  Take these medicines the morning of surgery with A SIP OF WATER: WELLBUTRIN, METOPROLOL, PROTONIX   Do not wear jewelry, make-up or nail polish.  Do not wear lotions, powders, or perfumes. You may wear deodorant.  Do not shave 48 hours prior to surgery. Men may shave face and neck.  Do not bring valuables to the hospital.  Contacts, dentures or bridgework may not be worn into surgery.  Leave suitcase in the car. After surgery it may be brought to your room.  For patients admitted to the hospital, checkout time is 11:00 AM the day of discharge.   Patients discharged the day of surgery will not be allowed to drive home.  Name and phone number of your driver: /w sister   Special Instructions: CHG Shower Use Special Wash: 1/2 bottle night before surgery and 1/2 bottle morning of surgery.   Please read over the following fact sheets that you were given: Pain Booklet, Coughing and Deep Breathing, MRSA Information and Surgical Site Infection Prevention

## 2011-08-19 NOTE — Pre-Procedure Instructions (Signed)
20 COURAGE BIGLOW  08/19/2011   Your procedure is scheduled on:  Aug 27, 2011  Report to [facility] at 0730 AM.  Call this number if you have problems the morning of surgery: [phone]   Remember:   Do not eat food:After Midnight.  May have clear liquids: up to 4 Hours before arrival (330am).  Clear liquids include soda, tea, black coffee, apple or grape juice, broth.  Take these medicines the morning of surgery with A SIP OF WATER: Wellbutrin, lopressor, protonix, flomax   Do not wear jewelry, make-up or nail polish.  Do not wear lotions, powders, or perfumes. You may wear deodorant.  Do not shave 48 hours prior to surgery. Men may shave face and neck.  Do not bring valuables to the hospital.  Contacts, dentures or bridgework may not be worn into surgery.  Leave suitcase in the car. After surgery it may be brought to your room.  For patients admitted to the hospital, checkout time is 11:00 AM the day of discharge.   Patients discharged the day of surgery will not be allowed to drive home.  Special Instructions: CHG Shower Use Special Wash: 1/2 bottle night before surgery and 1/2 bottle morning of surgery.   Please read over the following fact sheets that you were given: Pain Booklet, Coughing and Deep Breathing, MRSA Information and Surgical Site Infection Prevention

## 2011-08-27 ENCOUNTER — Ambulatory Visit (HOSPITAL_COMMUNITY)
Admission: RE | Admit: 2011-08-27 | Discharge: 2011-08-27 | Disposition: A | Discharge status: Home / Self Care | Payer: Medicare Other | Source: Ambulatory Visit | Attending: Emergency Medicine | Admitting: Emergency Medicine

## 2011-08-27 ENCOUNTER — Ambulatory Visit (HOSPITAL_COMMUNITY): Payer: Medicare Other

## 2011-08-27 ENCOUNTER — Encounter (HOSPITAL_COMMUNITY): Payer: Self-pay | Admitting: Vascular Surgery

## 2011-08-27 ENCOUNTER — Encounter (HOSPITAL_COMMUNITY): Payer: Self-pay | Admitting: *Deleted

## 2011-08-27 ENCOUNTER — Encounter (HOSPITAL_COMMUNITY)
Admission: RE | Disposition: A | Discharge status: Home / Self Care | Payer: Self-pay | Source: Ambulatory Visit | Attending: Emergency Medicine

## 2011-08-27 ENCOUNTER — Encounter (HOSPITAL_COMMUNITY): Payer: Self-pay | Admitting: Anesthesiology

## 2011-08-27 ENCOUNTER — Ambulatory Visit (HOSPITAL_COMMUNITY): Payer: Medicare Other | Admitting: Anesthesiology

## 2011-08-27 ENCOUNTER — Encounter (HOSPITAL_COMMUNITY): Payer: Self-pay | Admitting: Emergency Medicine

## 2011-08-27 DIAGNOSIS — I10 Essential (primary) hypertension: Secondary | ICD-10-CM | POA: Diagnosis not present

## 2011-08-27 DIAGNOSIS — R911 Solitary pulmonary nodule: Secondary | ICD-10-CM | POA: Diagnosis not present

## 2011-08-27 DIAGNOSIS — K219 Gastro-esophageal reflux disease without esophagitis: Secondary | ICD-10-CM | POA: Insufficient documentation

## 2011-08-27 DIAGNOSIS — F411 Generalized anxiety disorder: Secondary | ICD-10-CM | POA: Insufficient documentation

## 2011-08-27 DIAGNOSIS — R222 Localized swelling, mass and lump, trunk: Secondary | ICD-10-CM | POA: Diagnosis not present

## 2011-08-27 DIAGNOSIS — J984 Other disorders of lung: Secondary | ICD-10-CM | POA: Diagnosis not present

## 2011-08-27 DIAGNOSIS — C3430 Malignant neoplasm of lower lobe, unspecified bronchus or lung: Secondary | ICD-10-CM | POA: Insufficient documentation

## 2011-08-27 DIAGNOSIS — J449 Chronic obstructive pulmonary disease, unspecified: Secondary | ICD-10-CM | POA: Diagnosis not present

## 2011-08-27 DIAGNOSIS — J4489 Other specified chronic obstructive pulmonary disease: Secondary | ICD-10-CM | POA: Insufficient documentation

## 2011-08-27 DIAGNOSIS — Z9861 Coronary angioplasty status: Secondary | ICD-10-CM | POA: Insufficient documentation

## 2011-08-27 DIAGNOSIS — C3431 Malignant neoplasm of lower lobe, right bronchus or lung: Secondary | ICD-10-CM | POA: Insufficient documentation

## 2011-08-27 DIAGNOSIS — C3410 Malignant neoplasm of upper lobe, unspecified bronchus or lung: Secondary | ICD-10-CM | POA: Diagnosis not present

## 2011-08-27 DIAGNOSIS — J439 Emphysema, unspecified: Secondary | ICD-10-CM | POA: Insufficient documentation

## 2011-08-27 DIAGNOSIS — Z85118 Personal history of other malignant neoplasm of bronchus and lung: Secondary | ICD-10-CM | POA: Insufficient documentation

## 2011-08-27 DIAGNOSIS — I2510 Atherosclerotic heart disease of native coronary artery without angina pectoris: Secondary | ICD-10-CM | POA: Diagnosis not present

## 2011-08-27 DIAGNOSIS — I252 Old myocardial infarction: Secondary | ICD-10-CM | POA: Insufficient documentation

## 2011-08-27 DIAGNOSIS — R0602 Shortness of breath: Secondary | ICD-10-CM | POA: Diagnosis not present

## 2011-08-27 SURGERY — VIDEO BRONCHOSCOPY WITH ENDOBRONCHIAL NAVIGATION
Anesthesia: General | Laterality: Right

## 2011-08-27 SURGERY — VIDEO BRONCHOSCOPY WITH ENDOBRONCHIAL NAVIGATION
Anesthesia: General | Site: Chest | Laterality: Right | Wound class: Clean Contaminated

## 2011-08-27 MED ORDER — EPHEDRINE SULFATE 50 MG/ML IJ SOLN
INTRAMUSCULAR | Status: DC | PRN
  Administered 2011-08-27: 5 mg via INTRAVENOUS
  Administered 2011-08-27: 10 mg via INTRAVENOUS

## 2011-08-27 MED ORDER — PROPOFOL 10 MG/ML IV BOLUS
INTRAVENOUS | Status: DC | PRN
  Administered 2011-08-27: 170 mg via INTRAVENOUS

## 2011-08-27 MED ORDER — 0.9 % SODIUM CHLORIDE (POUR BTL) OPTIME
TOPICAL | Status: DC | PRN
  Administered 2011-08-27: 1000 mL

## 2011-08-27 MED ORDER — DROPERIDOL 2.5 MG/ML IJ SOLN: 0.6250 mg | INTRAMUSCULAR | Status: DC | PRN

## 2011-08-27 MED ORDER — HYDROMORPHONE HCL PF 1 MG/ML IJ SOLN: 0.2500 mg | INTRAMUSCULAR | Status: DC | PRN

## 2011-08-27 MED ORDER — LACTATED RINGERS IV SOLN
INTRAVENOUS | Status: DC | PRN
  Administered 2011-08-27: 08:00:00 via INTRAVENOUS

## 2011-08-27 MED ORDER — ROCURONIUM BROMIDE 100 MG/10ML IV SOLN
INTRAVENOUS | Status: DC | PRN
  Administered 2011-08-27: 40 mg via INTRAVENOUS

## 2011-08-27 MED ORDER — NEOSTIGMINE METHYLSULFATE 1 MG/ML IJ SOLN
INTRAMUSCULAR | Status: DC | PRN
  Administered 2011-08-27: 5 mg via INTRAVENOUS

## 2011-08-27 MED ORDER — SUFENTANIL CITRATE 50 MCG/ML IV SOLN
INTRAVENOUS | Status: DC | PRN
  Administered 2011-08-27 (×3): 10 ug via INTRAVENOUS

## 2011-08-27 MED ORDER — CLOPIDOGREL BISULFATE 75 MG PO TABS: 75.0000 mg | ORAL_TABLET | Freq: Every day | ORAL | Status: DC

## 2011-08-27 MED ORDER — GLYCOPYRROLATE 0.2 MG/ML IJ SOLN
INTRAMUSCULAR | Status: DC | PRN
  Administered 2011-08-27: .6 mg via INTRAVENOUS

## 2011-08-27 MED ORDER — ONDANSETRON HCL 4 MG/2ML IJ SOLN
INTRAMUSCULAR | Status: DC | PRN
  Administered 2011-08-27: 4 mg via INTRAVENOUS

## 2011-08-27 SURGICAL SUPPLY — 26 items
BRUSH CYTOL CELLEBRITY 1.5X140 (MISCELLANEOUS) ×3 IMPLANT
BRUSH SUPERTRAX NDL-TIP CYTO (INSTRUMENTS) ×3 IMPLANT
CANISTER SUCTION 2500CC (MISCELLANEOUS) ×3 IMPLANT
CLOTH BEACON ORANGE TIMEOUT ST (SAFETY) ×3 IMPLANT
CONT SPEC 4OZ CLIKSEAL STRL BL (MISCELLANEOUS) ×6 IMPLANT
COVER TABLE BACK 60X90 (DRAPES) ×3 IMPLANT
FORCEPS BIOP RJ4 1.8 (CUTTING FORCEPS) IMPLANT
FORCEPS BIOP SUPERTRX PREMAR (INSTRUMENTS) ×3 IMPLANT
GLOVE SURG SIGNA 7.5 PF LTX (GLOVE) ×3 IMPLANT
KIT PROCEDURE EDGE 90 (KITS) ×3 IMPLANT
KIT ROOM TURNOVER OR (KITS) ×3 IMPLANT
MARKER SKIN DUAL TIP RULER LAB (MISCELLANEOUS) ×3 IMPLANT
NEEDLE BIOPSY TRANSBRONCH 21G (NEEDLE) IMPLANT
NEEDLE SUPERTRX PREMARK BIOPSY (NEEDLE) ×3 IMPLANT
NEEDLE SYS SONOTIP II EBUSTBNA (NEEDLE) IMPLANT
NS IRRIG 1000ML POUR BTL (IV SOLUTION) ×3 IMPLANT
OIL SILICONE PENTAX (PARTS (SERVICE/REPAIRS)) ×3 IMPLANT
PAD ARMBOARD 7.5X6 YLW CONV (MISCELLANEOUS) ×6 IMPLANT
PATCHES PATIENT (LABEL) ×3 IMPLANT
SPONGE GAUZE 4X4 12PLY (GAUZE/BANDAGES/DRESSINGS) ×3 IMPLANT
SYR 20CC LL (SYRINGE) ×3 IMPLANT
SYR 20ML ECCENTRIC (SYRINGE) ×6 IMPLANT
SYR 5ML LUER SLIP (SYRINGE) IMPLANT
TOWEL OR 17X24 6PK STRL BLUE (TOWEL DISPOSABLE) IMPLANT
TRAP SPECIMEN MUCOUS 40CC (MISCELLANEOUS) ×3 IMPLANT
TUBE CONNECTING 12X1/4 (SUCTIONS) ×3 IMPLANT

## 2011-08-27 NOTE — Preoperative (Signed)
Beta Blockers   Reason not to administer Beta Blockers:Not Applicable 

## 2011-08-27 NOTE — Discharge Instructions (Signed)
Bronchoscopy Care After These instructions give you information on caring for yourself after your procedure. Your doctor may also give you specific instructions. Call your doctor if you have any problems or questions after your procedure. HOME CARE  Do not eat or drink anything for 2 hours after your test. Your nose and throat was numbed by medicine. If you try to eat or drink before the medicine wears off, food or drink could go into your lungs.   For the rest of the first day, eat soft food and drink liquids slowly.   On the day after the test, you can go back to eating your usual food.   Do not drive or sign important papers the day of the test.   Take it easy for the next 2 days. Do not do any heavy work, exercise, or activities.   Only take medicine as told by your doctor. Do not take aspirin.   You may be drowsy for the next 24 hours.   You may see traces of blood in your spit for 1 to 2 days.  Finding out the results of your test Ask when your test results will be ready. Make sure you get your test results. GET HELP RIGHT AWAY IF:  You have breathing problems.   You have a bad sore throat for more than 1 week.   You see traces of blood in your spit for more than 3 days.   You start coughing up blood.   You have a temperature of 102 F (38.9 C) or higher.  MAKE SURE YOU:  Understand these instructions.   Will watch your condition.   Will get help right away if you are not doing well or get worse.  Document Released: 01/19/2009 Document Revised: 03/13/2011 Document Reviewed: 01/19/2009 [facility] Patient Information 2012 [place], [place].  Additional Instructions:  1. You may resume your normal medications at their next scheduled dosage time with the exception of your Plavix. Please do not restart your Plavix until Friday 08/29/11.  2. You may cough up some mucous with streaks of blood. This is often seen after a bronchoscopy. Call Dr Kavin Leech office, [phone], for any  chest pain, breathing difficulty, or if you are coughing up frank blood.

## 2011-08-27 NOTE — H&P (View-Only) (Signed)
CT chest and PET scans reviewed.  Right mid lung field lesion has developed recently.  Referral to Dr. Armonie Byrum made for consideration of FOB and directed biopsy. 

## 2011-08-27 NOTE — Interval H&P Note (Signed)
PCCM Interval Note  Reviewed pt's status since our original office visit. Doing well, no issues. He stopped plavix 5 days ago as planned. No barriers identified to proceeding. His sister is here with him today.   Filed Vitals:   08/27/11 0619  BP: 143/78  Pulse: 61  Temp: 97.9 F (36.6 C)  Resp: 16   Plan:  Bx of RLL nodule, PET positive, using FOB + ENB. Should have results by Friday 5/24  Levy Pupa, MD, PhD 08/27/2011, 8:35 AM [facility] [phone] or if no answer [phone]

## 2011-08-27 NOTE — Op Note (Signed)
Video Bronchoscopy with Electromagnetic Navigation Procedure Note  Date of Operation: 08/27/2011  Pre-op Diagnosis: RLL nodule  Post-op Diagnosis: RLL nodule  Surgeon: Levy Pupa  Assistants: none  Anesthesia: General endotracheal anesthesia  Operation: Flexible video fiberoptic bronchoscopy with electromagnetic navigation and biopsies.  Estimated Blood Loss: approximately 5cc  Complications: none apparent  Indications and History: [name] Fields is a 77 y.o. male with a history of IIIB squamous cell lung CA s/p chemo and XRT. Found to have RLL 1x1cm nodule, referred for tissue dx by Dr Arline Asp. Decision was made to perform FOB with navigation and bx's.  The risks, benefits, complications, treatment options and expected outcomes were discussed with the patient.  The possibilities of pneumothorax, pneumonia, reaction to medication, pulmonary aspiration, perforation of a viscus, bleeding, failure to diagnose a condition and creating a complication requiring transfusion or operation were discussed with the patient who freely signed the consent.    Description of Procedure: The patient was seen in the Preoperative Area, was examined and was deemed appropriate to proceed.  The patient was taken to OR7, identified as [name] Fields and the procedure verified as Flexible Video Fiberoptic Bronchoscopy.  A Time Out was held and the above information confirmed.   Prior to the date of the procedure a high-resolution CT scan of the chest was performed. Utilizing superDimension software a virtual tracheobronchial tree was generated to allow the creation of distinct navigation pathways to the patient's parenchymal abnormality. After being taken to the operating room general anesthesia was initiated and the patient  was orally intubated. The video fiberoptic bronchoscope was introduced via the endotracheal tube and a general inspection was performed which showed partially occluded lingular airway,  some narrowing and distortion of the LLL airway, presumed from his prior XRT. The extendable working channel and locator guide were introduced into the bronchoscope. The distinct navigation pathways prepared prior to this procedure were then utilized to navigate to within 0.5 - 1.0cm of patient's lesion identified on CT scan. The extendable working channel was secured into place and the locator guide was withdrawn. Under fluoroscopic guidance transbronchial brushings, needle brushings, transbronchial Wang needle biopsies, and transbronchial forceps biopsies were performed to be sent for cytology and pathology. A bronchioalveolar lavage was performed in the RLL and sent for cytology. At the end of the procedure a general airway inspection was performed and there was no evidence of active bleeding. The bronchoscope was removed.  The patient tolerated the procedure well. There was no significant blood loss and there were no obvious complications. A post-procedural chest x-ray is pending.  Samples: 1. Transbronchial brushings from RLL (with and without needle) 2. Transbronchial Wang needle biopsies from RLL 3. Transbronchial forceps biopsies from RLL 4. Bronchoalveolar lavage from RLL  Plans:  The patient will be discharged from the PACU to home when recovered from anesthesia and after chest x-ray is reviewed. We will review the cytology, pathology and microbiology results with the patient when they become available. Outpatient followup will be with Dr Delton Coombes and Dr Arline Asp.    Levy Pupa, MD, PhD 08/27/2011, 10:07 AM [facility] [phone] or if no answer [phone]

## 2011-08-27 NOTE — Anesthesia Postprocedure Evaluation (Signed)
Anesthesia Post Note  Patient: [name] Fields  Procedure(s) Performed: Procedure(s) (LRB): VIDEO BRONCHOSCOPY WITH ENDOBRONCHIAL NAVIGATION (Right)  Anesthesia type: general  Patient location: PACU  Post pain: Pain level controlled  Post assessment: Patient's Cardiovascular Status Stable  Last Vitals:  Filed Vitals:   08/27/11 1013  BP:   Pulse:   Temp: 36.5 C  Resp:     Post vital signs: Reviewed and stable  Level of consciousness: sedated  Complications: No apparent anesthesia complications

## 2011-08-27 NOTE — Transfer of Care (Signed)
Immediate Anesthesia Transfer of Care Note  Patient: [name] Fields  Procedure(s) Performed: Procedure(s) (LRB): VIDEO BRONCHOSCOPY WITH ENDOBRONCHIAL NAVIGATION (Bilateral)  Patient Location: PACU  Anesthesia Type: General  Level of Consciousness: awake and alert   Airway & Oxygen Therapy: Patient Spontanous Breathing and Patient connected to face mask oxygen  Post-op Assessment: Report given to PACU RN and Post -op Vital signs reviewed and stable  Post vital signs: Reviewed and stable  Complications: No apparent anesthesia complications

## 2011-08-27 NOTE — Anesthesia Preprocedure Evaluation (Addendum)
Anesthesia Evaluation  Patient identified by MRN, date of birth, ID band Patient awake    Reviewed: Allergy & Precautions, H&P , NPO status , Patient's Chart, lab work & pertinent test results, reviewed documented beta blocker date and time   Airway Mallampati: II TM Distance: >3 FB Neck ROM: Full    Dental  (+) Teeth Intact and Dental Advisory Given   Pulmonary shortness of breath, COPD COPD inhaler,  breath sounds clear to auscultation  Pulmonary exam normal       Cardiovascular hypertension, Pt. on medications and Pt. on home beta blockers + CAD, + Past MI and + Cardiac Stents Rhythm:Regular Rate:Normal     Neuro/Psych Anxiety    GI/Hepatic GERD-  ,  Endo/Other    Renal/GU      Musculoskeletal   Abdominal   Peds  Hematology   Anesthesia Other Findings   Reproductive/Obstetrics                         Anesthesia Physical Anesthesia Plan  ASA: III  Anesthesia Plan: General   Post-op Pain Management:    Induction: Intravenous  Airway Management Planned: Oral ETT  Additional Equipment:   Intra-op Plan:   Post-operative Plan: Extubation in OR  Informed Consent: I have reviewed the patients History and Physical, chart, labs and discussed the procedure including the risks, benefits and alternatives for the proposed anesthesia with the patient or authorized representative who has indicated his/her understanding and acceptance.   Dental advisory given  Plan Discussed with: CRNA, Anesthesiologist and Surgeon  Anesthesia Plan Comments:         Anesthesia Quick Evaluation

## 2011-08-29 ENCOUNTER — Telehealth: Payer: Self-pay | Admitting: Emergency Medicine

## 2011-08-29 NOTE — Telephone Encounter (Signed)
Spoke with pt to review bx results. Nodule bx shows squamous cell lung CA. I will forward these results to DR Murinson and the pt will call his office next week to arrange f/u.

## 2011-09-02 ENCOUNTER — Telehealth: Payer: Self-pay | Admitting: Oncology

## 2011-09-02 ENCOUNTER — Other Ambulatory Visit: Payer: Self-pay | Admitting: Medical Oncology

## 2011-09-02 ENCOUNTER — Telehealth: Payer: Self-pay | Admitting: Medical Oncology

## 2011-09-02 DIAGNOSIS — C3410 Malignant neoplasm of upper lobe, unspecified bronchus or lung: Secondary | ICD-10-CM

## 2011-09-02 NOTE — Telephone Encounter (Signed)
pt aware of 5/31 appt

## 2011-09-02 NOTE — Telephone Encounter (Signed)
I called pt per Dr. Arline Asp to let him know that we are setting him up for a MRI of brain. Dr. Arline Asp is also going to speak with Dr. Alla German to see if pt is a surgical candidate or if stereotactic radiation would be the best approach.  We will contact him after Dr. Arline Asp speak with Dr. Alla German with update. He voiced understanding.

## 2011-09-03 ENCOUNTER — Telehealth: Payer: Self-pay | Admitting: Oncology

## 2011-09-03 NOTE — Telephone Encounter (Signed)
l/m with 6/7 mri at [facility]    [facility]

## 2011-09-04 ENCOUNTER — Telehealth: Payer: Self-pay

## 2011-09-04 NOTE — Telephone Encounter (Signed)
Received call from Dr Donata Clay office that pt has appt 6/3 at 245 pm. Forwarded this message to the pt. He expressed understanding.

## 2011-09-05 ENCOUNTER — Inpatient Hospital Stay (HOSPITAL_COMMUNITY): Admission: RE | Admit: 2011-09-05 | Payer: Medicare Other | Source: Ambulatory Visit

## 2011-09-08 ENCOUNTER — Institutional Professional Consult (permissible substitution) (INDEPENDENT_AMBULATORY_CARE_PROVIDER_SITE_OTHER): Payer: Medicare Other | Admitting: Cardiothoracic Surgery

## 2011-09-08 ENCOUNTER — Encounter: Payer: Self-pay | Admitting: Cardiothoracic Surgery

## 2011-09-08 VITALS — BP 108/72 | HR 73 | Resp 16 | Ht 70.0 in | Wt 190.0 lb

## 2011-09-08 DIAGNOSIS — Z85820 Personal history of malignant melanoma of skin: Secondary | ICD-10-CM

## 2011-09-08 DIAGNOSIS — J4489 Other specified chronic obstructive pulmonary disease: Secondary | ICD-10-CM

## 2011-09-08 DIAGNOSIS — Z85118 Personal history of other malignant neoplasm of bronchus and lung: Secondary | ICD-10-CM

## 2011-09-08 DIAGNOSIS — J449 Chronic obstructive pulmonary disease, unspecified: Secondary | ICD-10-CM

## 2011-09-08 DIAGNOSIS — I059 Rheumatic mitral valve disease, unspecified: Secondary | ICD-10-CM

## 2011-09-08 DIAGNOSIS — R911 Solitary pulmonary nodule: Secondary | ICD-10-CM | POA: Diagnosis not present

## 2011-09-08 DIAGNOSIS — Z9889 Other specified postprocedural states: Secondary | ICD-10-CM

## 2011-09-08 DIAGNOSIS — I340 Nonrheumatic mitral (valve) insufficiency: Secondary | ICD-10-CM

## 2011-09-08 NOTE — Progress Notes (Signed)
PCP is [name], MD, MD Referring Provider is Murinson, Gerarda Fraction, MD  Chief Complaint  Patient presents with  . Lung Lesion    Referral from Dr Arline Asp for eval on New Rt Lower Lobe nodule, PET Scan 08/05/11, bx 08/27/11                      [address]            [name] [address]          [phone]       HPI: I was asked to evaluate this 77 year old Caucasian male ex-smoker for evaluation and treatment of a newly diagnosed small right lower lobe nodule. 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 Arline Asp. The patient received 6600 CGY of radiotherapy and at adjunctive chemotherapy-carboplatin and Taxol. He is been followed by Dr.Murinson and carefully has had no evidence of local or distant recurrence. Until a recent right lower lobe nodule was noted on CT scan. Followup PET scan showed this to have hypermetabolic activity with SUV of 3.3. There is no indication of recurrent cancer in the left lung. The patient underwent navigational bronchoscopy and biopsy of the right lower lobe nodule by Dr. Delton Coombes and this indicated squamous cell cancer. There is no evidence of abnormal mediastinal adenopathy on PET scan. The patient probably has a metastatic focus to the contralateral lung. A brain MRI is pending later this week.   The patient's symptoms consist mainly of a deep dry cough. No associated fever weight loss or bone pain. No change in neuro status. The patient has not smoked for 15 years. Otherwise his health is been fairly good. The patient states he walks for about an hour day. He does have some dyspnea with on exertion and exertional wheezing. He has formal pulmonary function tests pending which had not yet been performed. The patient's last 2-D echo was in November of last year showing EF of 60% moderate MR no significant pericardial effusion. The patient is status post a subxiphoid  pericardial window for a moderate to large effusion in July 2011 pathology was negative for malignancy.  Past Medical History  Diagnosis Date  . IHD (ischemic heart disease)     Remote MI in 2001 with stent to the LCX  . Hypertension   . Hyperlipidemia   . SOB (shortness of breath)     Chronic with known fibrosis  . Hypokinesia     MILD INFERIOR  . GERD (gastroesophageal reflux disease)   . Lactose intolerance   . Diverticulosis   . Hearing loss   . Pleural effusion, left   . Pericardial effusion July 2011    s/p pericardial window; last echo in August 2012 with minimal effusion  . Normal nuclear stress test 2009    EF 63%. No ischemia. 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  . Non-small cell carcinoma of lung     Dx in October of 2010  . Arthritis     knees  . Anxiety     takes wellbutrin daily    Past Surgical History  Procedure Date  . Pericardial window July 2011  . Cardiovascular stress test 05/2007    EF 63%  . Coronary stent placement 07/1999    STENT TO THE LEFT CIRCUMFLEX  . Cardiac catheterization 07/11/2003    EF 60%, REPEAT, WHICH SHOWED 30% PROXIMAL NARROWING IN THE RIGHT  CORONARY ARTERY  .  Transthoracic echocardiogram 02/22/2010    EF 55-60%  . Hernia repair     as an adult- R inguinal     Family History  Problem Relation Age of Onset  . Lung cancer Father   . Heart disease Neg Hx   . Anesthesia problems Neg Hx     Social History History  Substance Use Topics  . Smoking status: Former Smoker -- 3.0 packs/day for 40 years    Types: Cigarettes    Quit date: 04/07/1994  . Smokeless tobacco: Never Used  . Alcohol Use: No    Current Outpatient Prescriptions  Medication Sig Dispense Refill  . buPROPion (WELLBUTRIN SR) 150 MG 12 hr tablet Take 150 mg by mouth daily.       . clopidogrel (PLAVIX) 75 MG tablet Take 1 tablet (75 mg total) by mouth daily. TO BE RESTARTED ON FRIDAY 08/29/11      . metoprolol tartrate (LOPRESSOR) 25 MG tablet Take 12.5 mg by mouth 2 (two)  times daily.      . pantoprazole (PROTONIX) 40 MG tablet Take 40 mg by mouth daily.      . Tamsulosin HCl (FLOMAX) 0.4 MG CAPS Take 0.4 mg by mouth daily with supper.       . temazepam (RESTORIL) 30 MG capsule Take 30 mg by mouth at bedtime as needed. For sleep        Allergies  Allergen Reactions  . Sertraline Hcl Other (See Comments)    unknown    Review of Systems: General no weight loss fever or night sweats HEENT positive for remote melanoma resection left face Cardiac good LV function history of pericardial window for post radiation pericarditis history of stent to the circumflex 2011 GI negative for jaundice or hepatitis Endocrine negative for diabetes Neurologic negative her stroke syncope TIA No bleeding disorder Musculoskeletal severe osteoarthritis of the right knee with a limp BP 108/72  Pulse 73  Resp 16  Ht 5\' 10"  (1.778 m)  Wt 190 lb (86.183 kg)  BMI 27.26 kg/m2  SpO2 98% Physical Exam: General alert and pleasant HEENT normocephalic pupils equal dentition good Neck no palpable adenopathy or JVD Thorax breath sounds diminished on the left no wheezing Cardiac regular rhythm without gallop 2/6 holosystolic murmur Abdomen soft nontender Extremities without clubbing or cyanosis or edema Neurologic alert oriented no focal deficit  Diagnostic Tests: CT scan of the chest, PET scan of the chest, pathology report recent 2-D echo all reviewed. Small 1-2 cm right lower lobe nodule biopsy proven squamous cell carcinoma.  Impression: Probable metastatic focus in the contralateral lung. The patient would be a poor candidate for right lower lobectomy be too reduced poorly functioning from previous radiation to the left lung. Walking the patient 200 feet in the office left him significant short of breath and with wheezes. Formal PFTs are pending.   Plan: I recommend presenting this patient to the radiation oncology group for consideration of SB RT to the right lower lobe  which will be better tolerated than thoracotomy and resection. He will be presented at the [facility] conference this week.

## 2011-09-11 ENCOUNTER — Ambulatory Visit
Admission: RE | Admit: 2011-09-11 | Discharge: 2011-09-11 | Disposition: A | Discharge status: Home / Self Care | Payer: Medicare Other | Source: Ambulatory Visit | Attending: Radiation Oncology | Admitting: Radiation Oncology

## 2011-09-11 ENCOUNTER — Encounter: Payer: Self-pay | Admitting: Radiation Oncology

## 2011-09-11 ENCOUNTER — Institutional Professional Consult (permissible substitution): Payer: Medicare Other

## 2011-09-11 VITALS — BP 133/78 | HR 60 | Temp 97.3°F | Resp 18 | Ht 70.0 in | Wt 198.0 lb

## 2011-09-11 DIAGNOSIS — C3410 Malignant neoplasm of upper lobe, unspecified bronchus or lung: Secondary | ICD-10-CM

## 2011-09-11 NOTE — Progress Notes (Signed)
Radiation Oncology         [phone] ________________________________  Initial Outpatient Consultation  Name: [name] MRN: [id number]  Date: 09/11/2011  DOB: 02/19/1935  REFERRING PHYSICIAN: [name], MD  DIAGNOSIS: The encounter diagnosis was Malignant neoplasm of upper lobe, bronchus or lung.  HISTORY OF PRESENT ILLNESS::Yani [name] is a 77 y.o. male  well known to me from his previous chemotherapy and radiation which ended in January of 2011. He has been followed by Dr. Gearlean Alf was found to have an enlarging right lower lobe nodule a PET scan was ordered which showed this nodule to have increased uptake. A biopsy was performed on 08/27/2011 which revealed squamous cell carcinoma. This was consistent with his prior pathology. He overall is doing well. He has some shortness of breath but walks every morning. He denies any headaches, weakness or numbness. He denies any hemoptysis.Marland Kitchen  PREVIOUS RADIATION THERAPY: No  PAST MEDICAL HISTORY:  has a past medical history of IHD (ischemic heart disease); Hypertension; Hyperlipidemia; SOB (shortness of breath); Hypokinesia; GERD (gastroesophageal reflux disease); Lactose intolerance; Diverticulosis; Hearing loss; Pleural effusion, left; Pericardial effusion (July 2011); Normal nuclear stress test (2009); Non-small cell carcinoma of lung; Arthritis; and Anxiety.    PAST SURGICAL HISTORY: Past Surgical History  Procedure Date  . Pericardial window July 2011  . Cardiovascular stress test 05/2007    EF 63%  . Coronary stent placement 07/1999    STENT TO THE LEFT CIRCUMFLEX  . Cardiac catheterization 07/11/2003    EF 60%, REPEAT, WHICH SHOWED 30% PROXIMAL NARROWING IN THE RIGHT  CORONARY ARTERY  . Transthoracic echocardiogram 02/22/2010    EF 55-60%  . Hernia repair     as an adult- R inguinal     FAMILY HISTORY: family history includes Lung cancer in his father.  There is no history of Heart disease and Anesthesia  problems.  SOCIAL HISTORY:  reports that he quit smoking about 17 years ago. His smoking use included Cigarettes. He has a 120 pack-year smoking history. He has never used smokeless tobacco. He reports that he does not drink alcohol or use illicit drugs.  ALLERGIES: Sertraline hcl  MEDICATIONS:  Current Outpatient Prescriptions  Medication Sig Dispense Refill  . buPROPion (WELLBUTRIN SR) 150 MG 12 hr tablet Take 150 mg by mouth daily.       . clopidogrel (PLAVIX) 75 MG tablet Take 1 tablet (75 mg total) by mouth daily. TO BE RESTARTED ON FRIDAY 08/29/11      . metoprolol tartrate (LOPRESSOR) 25 MG tablet Take 12.5 mg by mouth 2 (two) times daily.      . pantoprazole (PROTONIX) 40 MG tablet Take 40 mg by mouth daily.      . Tamsulosin HCl (FLOMAX) 0.4 MG CAPS Take 0.4 mg by mouth daily with supper.       . temazepam (RESTORIL) 30 MG capsule Take 30 mg by mouth at bedtime as needed. For sleep        REVIEW OF SYSTEMS:  A 15 point review of systems is documented in the electronic medical record. This was obtained by the nursing staff. However, I reviewed this with the patient to discuss relevant findings and make appropriate changes.  Pertinent items are noted in HPI.   PHYSICAL EXAM:  height is 5\' 10"  (1.778 m) and weight is 198 lb (89.812 kg). His oral temperature is 97.3 F (36.3 C). His blood pressure is 133/78 and his pulse is 60. His respiration is 18 and  oxygen saturation is 97%.   Marland Kitchen He is in no distress sitting comfortably on examining her table. Sounds are clear bilaterally. He is alert and oriented x3.  LABORATORY DATA:  Lab Results  Component Value Date   WBC 7.1 08/19/2011   HGB 15.9 08/19/2011   HCT 46.2 08/19/2011   MCV 90.8 08/19/2011   PLT 219 08/19/2011   Lab Results  Component Value Date   NA 141 08/19/2011   [name] 3.9 08/19/2011   CL 103 08/19/2011   CO2 29 08/19/2011   Lab Results  Component Value Date   ALT 18 08/19/2011   AST 27 08/19/2011   ALKPHOS 83 08/19/2011    BILITOT 0.5 08/19/2011     RADIOGRAPHY: Dg Chest Port 1 View  08/27/2011  *RADIOLOGY REPORT*  Clinical Data: Postop video bronchoscopy with biopsies.  PORTABLE CHEST - 1 VIEW  Comparison: CT chest 07/21/2011 and chest radiograph 07/07/2011.  Findings: Left perihilar predominant radiation fibrosis and overall volume loss in the left lung, with leftward shift of the mediastinum.  There is added density at the right lung base at the site of known right lower lobe nodule.  No pneumothorax.  Right IJ power port tip projects over the SVC.  IMPRESSION:  1.  Probable mild post procedural hemorrhage in the right lower lobe, associated with a known right lower lobe nodule. No pneumothorax. 2.  Radiation fibrosis and volume loss in the left hemithorax, as before.  Original Report Authenticated By: Reyes Ivan, M.D.   Dg C-arm Bronchoscopy  08/27/2011  CLINICAL DATA: Navigation   C-ARM BRONCHOSCOPY  Fluoroscopy was utilized by the requesting physician.  No radiographic  interpretation.        IMPRESSION: 77 year old with previous chemoradiation to the left lung now with a biopsy-proven T1 N0 non-small cell lung cancer of the right lower lobe  PLAN: I discussed with [name] my recommendations for treatment of this new right lower lobe cancer. Dr. Alla German does not believe he is a good candidate for surgical resection. He actually does not want surgery. I believe he is an excellent candidate for SBRT. I believe we can safely retreat this nodule in maintaining normal lung function. We discussed the issues involved and 3-5 treatments. We discussed the possibility of damage to critical normal structures including but not limited to the spinal cord normal lung and heart. We discussed the use of 4-dimensional CT scan as well as respiratory compression to limit lung motion. He has signed informed consent and agree to proceed forward. I have scheduled her for a simulation and treatment planning session at 1:00 on  Tuesday. I've given him this appointment. I spent 40 minutes  face to face with the patient and more than 50% of that time was spent in counseling and/or coordination of care.   ------------------------------------------------  [name], MD

## 2011-09-12 ENCOUNTER — Ambulatory Visit
Admission: RE | Admit: 2011-09-12 | Discharge: 2011-09-12 | Disposition: A | Discharge status: Home / Self Care | Payer: Medicare Other | Source: Ambulatory Visit | Attending: Oncology | Admitting: Oncology

## 2011-09-12 DIAGNOSIS — C3410 Malignant neoplasm of upper lobe, unspecified bronchus or lung: Secondary | ICD-10-CM

## 2011-09-12 DIAGNOSIS — G319 Degenerative disease of nervous system, unspecified: Secondary | ICD-10-CM | POA: Diagnosis not present

## 2011-09-12 DIAGNOSIS — I6789 Other cerebrovascular disease: Secondary | ICD-10-CM | POA: Diagnosis not present

## 2011-09-12 DIAGNOSIS — C3490 Malignant neoplasm of unspecified part of unspecified bronchus or lung: Secondary | ICD-10-CM | POA: Diagnosis not present

## 2011-09-12 MED ORDER — GADOBENATE DIMEGLUMINE 529 MG/ML IV SOLN
17.0000 mL | Freq: Once | INTRAVENOUS | Status: AC | PRN
  Administered 2011-09-12: 17 mL via INTRAVENOUS

## 2011-09-15 ENCOUNTER — Telehealth: Payer: Self-pay | Admitting: Medical Oncology

## 2011-09-15 NOTE — Telephone Encounter (Signed)
I called pt per Dr. Arline Asp to let him know his brain MRI is negative for cancer. He voiced understanding.

## 2011-09-15 NOTE — Progress Notes (Signed)
Quick Note:  Please notify patient and call/fax these results to patient's doctors. ______ 

## 2011-09-16 ENCOUNTER — Ambulatory Visit
Admission: RE | Admit: 2011-09-16 | Discharge: 2011-09-16 | Disposition: A | Discharge status: Home / Self Care | Payer: Medicare Other | Source: Ambulatory Visit | Attending: Radiation Oncology | Admitting: Radiation Oncology

## 2011-09-16 ENCOUNTER — Encounter: Payer: Self-pay | Admitting: Radiation Oncology

## 2011-09-16 VITALS — BP 104/63 | HR 57 | Temp 96.8°F | Resp 18 | Wt 198.1 lb

## 2011-09-16 DIAGNOSIS — C3410 Malignant neoplasm of upper lobe, unspecified bronchus or lung: Secondary | ICD-10-CM | POA: Diagnosis not present

## 2011-09-16 DIAGNOSIS — C3430 Malignant neoplasm of lower lobe, unspecified bronchus or lung: Secondary | ICD-10-CM | POA: Insufficient documentation

## 2011-09-16 DIAGNOSIS — Z510 Encounter for antineoplastic radiation therapy: Secondary | ICD-10-CM | POA: Diagnosis not present

## 2011-09-16 NOTE — Progress Notes (Signed)
Received patient in the clinic today for nurse evaluation prior to CT/SIM. Patient seen by Dr. Michell Heinrich in [facility]. Patient is alert and oriented to person, place, and time. No distress noted. Steady gait noted. Pleasant affect noted. Patient denies pain at this time. Patient reports a persistent dry cough. Patient denies having a pacemaker. Reported all findings to Dr. Michell Heinrich. Patient escorted to CT/SIM by Eileen Stanford, RT RT.

## 2011-09-16 NOTE — Progress Notes (Addendum)
[facility] Radiation Oncology Simulation and Treatment Planning Note   Name:  [name] MRN: [id number]   Date: 09/16/2011  DOB: [birth year]  Status:outpatient    DIAGNOSIS: T1NO NSCLC Right lower lobe   CONSENT VERIFIED:yes   SET UP: Patient is setup supine   IMMOBILIZATION: The patient was immobilized using a Vac Loc bag and Abdominal Compression.   NARRATIVE:The patient was brought to the CT Simulation planning suite.  Identity was confirmed.  All relevant records and images related to the planned course of therapy were reviewed.  Then, the patient was positioned in a stable reproducible clinical set-up for radiation therapy. Abdominal compression was applied by me.  4D CT images were obtained and reproducible breathing pattern was confirmed. Free breathing CT images were obtained.  Skin markings were placed.  The CT images were loaded into the planning software where the target and avoidance structures were contoured.  The radiation prescription was entered and confirmed.    TREATMENT PLANNING NOTE:  Treatment planning then occurred. I have requested : MLC's, isodose plan, basic dose calculation.  3 dimensional simulation is performed and dose volume histogram of the gross tumor volume, planning tumor volume and criticial normal structures including the spinal cord and lungs were analyzed and requested.  Special treatment procedure was performed due to high dose per fraction.  The patient will be monitored for increased risk of toxicity.  Daily imaging using cone beam CT will be used for target localization.

## 2011-09-18 ENCOUNTER — Encounter: Payer: Self-pay | Admitting: Emergency Medicine

## 2011-09-18 ENCOUNTER — Ambulatory Visit (INDEPENDENT_AMBULATORY_CARE_PROVIDER_SITE_OTHER): Payer: Medicare Other | Admitting: Emergency Medicine

## 2011-09-18 VITALS — BP 90/62 | HR 60 | Temp 98.1°F | Ht 70.0 in | Wt 196.2 lb

## 2011-09-18 DIAGNOSIS — R911 Solitary pulmonary nodule: Secondary | ICD-10-CM

## 2011-09-18 NOTE — Patient Instructions (Addendum)
Please follow with Drs Michell Heinrich and Murinson as planned Call our office for any chest pain, breathing difficulty or any other worrisome symptoms.

## 2011-09-18 NOTE — Assessment & Plan Note (Signed)
FOB and bx showed recurrent squamous cell CA. He is set up with Dr Michell Heinrich, has f/u soon with Dr Arline Asp

## 2011-09-18 NOTE — Progress Notes (Signed)
  Subjective:    Patient ID: [name], male    DOB: 08-24-1934, 77 y.o.   MRN: [id number]  HPI 77 yo man, former smoker, hx squamous cell lung CA, IIIB s/p chemo + XRT. He was doing well and scans were reassuring until most recent scan 07/21/11 showed new R mid-lung nodule 1x1cm. PET scan on 4/30 showed hypermetabolic activity. He has had paroxysmal cough x 4 months.  Non-productive. He does have some PND, especially when out walking. No hemoptysis. He denies any GERD sx. The PND is longstanding but the cough is fairly new.  He is referred for possible biopsy  ROV 09/18/11 -- follow up after ENB and bx's. Path showed metastatic squamous cell CA. He has started XRT with Dr Michell Heinrich, planning and tattoo's have been done. Not decided yet whether he will also have chemo, plan is to f/u w Dr Arline Asp. His biggest complaint is fatigue and malaise. No CP. Non- productive cough.   Filed Vitals:   09/18/11 1330  BP: 90/62  Pulse: 60  Temp: 98.1 F (36.7 C)   Gen: Pleasant, well-nourished, in no distress,  normal affect  ENT: No lesions,  mouth clear,  oropharynx clear, no postnasal drip  Neck: No JVD, no TMG, no carotid bruits  Lungs: No use of accessory muscles, no dullness to percussion, clear without rales or rhonchi  Cardiovascular: RRR, heart sounds normal, no murmur or gallops, no peripheral edema  Musculoskeletal: No deformities, no cyanosis or clubbing  Neuro: alert, non focal  Skin: Warm, no lesions or rashes   PET scan 08/05/11 --  Comparison: CT 07/21/2011, PET CT 01/04/2010  Findings:  Neck: No hypermetabolic nodes in the neck.  Chest: 10 mm right lower lobe nodule (image 109) has moderate F D G  activity with SUV max = 3.3. This is lesion of concern on  comparison CT. No evidence of hypermetabolic mediastinal or hilar  lymph nodes.  There is a post therapy change in the left hemithorax with pleural  fluid and volume loss. Small pericardial effusion. The  Abdomen /  Pelvis:No abnormal hypermetabolic activity within the  solid organs. No evidence of abdominal or pelvic hypermetabolic  nodes.  Skeleton:No focal hypermetabolic activity to suggest skeletal  metastasis.  IMPRESSION:  1. Hypermetabolic right lower lobe nodule is concerning for  primary bronchogenic carcinoma. Recommend biopsy versus resection.  2. No evidence of mediastinal metastasis.  3. Postoperative and post therapy change in the left hemithorax  with pleural fluid and small pericardial effusion.       Assessment & Plan:  Pulmonary nodule FOB and bx showed recurrent squamous cell CA. He is set up with Dr Michell Heinrich, has f/u soon with Dr Arline Asp

## 2011-09-23 ENCOUNTER — Ambulatory Visit (HOSPITAL_BASED_OUTPATIENT_CLINIC_OR_DEPARTMENT_OTHER): Payer: Medicare Other | Admitting: Oncology

## 2011-09-23 ENCOUNTER — Encounter: Payer: Self-pay | Admitting: Oncology

## 2011-09-23 ENCOUNTER — Telehealth: Payer: Self-pay | Admitting: Oncology

## 2011-09-23 ENCOUNTER — Other Ambulatory Visit (HOSPITAL_BASED_OUTPATIENT_CLINIC_OR_DEPARTMENT_OTHER): Payer: Medicare Other | Admitting: Lab

## 2011-09-23 VITALS — BP 116/64 | HR 67 | Temp 96.7°F | Ht 70.0 in | Wt 196.3 lb

## 2011-09-23 DIAGNOSIS — C3490 Malignant neoplasm of unspecified part of unspecified bronchus or lung: Secondary | ICD-10-CM | POA: Diagnosis not present

## 2011-09-23 DIAGNOSIS — C3410 Malignant neoplasm of upper lobe, unspecified bronchus or lung: Secondary | ICD-10-CM

## 2011-09-23 DIAGNOSIS — Z510 Encounter for antineoplastic radiation therapy: Secondary | ICD-10-CM | POA: Diagnosis not present

## 2011-09-23 DIAGNOSIS — R911 Solitary pulmonary nodule: Secondary | ICD-10-CM

## 2011-09-23 DIAGNOSIS — C3430 Malignant neoplasm of lower lobe, unspecified bronchus or lung: Secondary | ICD-10-CM | POA: Diagnosis not present

## 2011-09-23 LAB — CBC WITH DIFFERENTIAL/PLATELET
Basophils Absolute: 0.2 10*3/uL — ABNORMAL HIGH (ref 0.0–0.1)
EOS%: 6.6 % (ref 0.0–7.0)
Eosinophils Absolute: 0.4 10*3/uL (ref 0.0–0.5)
HCT: 46.2 % (ref 38.4–49.9)
HGB: 15.2 g/dL (ref 13.0–17.1)
MONO#: 0.7 10*3/uL (ref 0.1–0.9)
NEUT#: 3.7 10*3/uL (ref 1.5–6.5)
NEUT%: 62.3 % (ref 39.0–75.0)
RDW: 14.1 % (ref 11.0–14.6)
WBC: 5.9 10*3/uL (ref 4.0–10.3)
lymph#: 1 10*3/uL (ref 0.9–3.3)

## 2011-09-23 NOTE — Progress Notes (Signed)
CC:   [name], M.D. [name]. [name], M.D. [name], M.D. [name], M.D. [name], MD,FCCP [name], MD  PROBLEM LIST:  1. Squamous cell carcinoma of the left lung, status post FNA on  02/01/2009, stage T4 N2 M0, stage IIIB, involving left upper lobe  and hilar region. The patient was treated with weekly carboplatin  and Taxol in combination with radiation treatments, 6,600 cGy in 33  fractions, directed at the left upper lobe and left hilar region  from 02/22/2009 through 04/12/2009. He then received consolidative  chemotherapy with cisplatin and gemcitabine for 4 cycles from  06/04/2009 through 09/06/2009. He received Neulasta during those  treatments. The patient is no longer on any treatment. His last  PET scan was on 10/12/2009. His last CT scan of the chest was on  11/26/2010. His last 2D echocardiogram was on 11/12/2010. His  last chest x-ray was today without evidence for active cancer.  2. Radiation pneumonitis and pericarditis, status post pericardial  window on 10/25/2009.  3. Hypertension.  4. Dyslipidemia.  5. Depression.  6. Melanoma involving the left face, T3a, status post wide excision on  02/12/2009. Melanoma in situ involving the chest in February 2007.  7. Bilateral hearing loss.  8. Obstructive uropathy.  9. Right Port-A-Cath placed on 07/11/2009. 10.Squamous cell carcinoma in a right lower lobe nodule which was     detected by scheduled surveillance CT scan of the chest carried out     on 07/21/2011, confirmed by PET scan on 08/05/2011 and ultimately     by transbronchial Wang needle biopsy from the right lower lobe on     08/27/2011. This lesion is most likely a new primary lung cancer,      Stage T1N0. 11.Skin lesion on right mid back, suspicious for either basal cell     carcinoma or squamous cell carcinoma of the skin detected in the early     months of 2013.  MEDICATIONS:  1. Wellbutrin 150 mg daily.  2.  Plavix 75 mg daily.  3. Lopressor 12.5 mg twice a day.  4. Protonix 40 mg twice a day.  5. Flomax 0.4 mg daily.  6. Restoril 15-30 mg h.s.   HISTORY:  I saw Derec Mozingo today for followup of his squamous cell carcinoma of the left lung, stage IIIB, with diagnosis going back to late October 2010.  Mr. Sennett was last seen by [place] on 05/28/2011.  At that time he was doing well.  We scheduled him for routine surveillance CT scan of the chest with IV contrast for 07/21/2011.  Unfortunately, there was a right lower lobe nodule that had developed when compared with the prior CT scan of 11/26/2011.  This looked suspicious for a primary bronchogenic carcinoma.  There was continued radiation fibrosis and volume loss as well as a small loculated left pleural effusion that was stable.  There was hepatic steatosis.  The patient underwent a PET scan on 08/05/2011 that showed that the right lower lobe nodule was hypermetabolic with an SUV max of 3.3.  There was no other evidence for disease.  The patient also underwent an MRI of the head with and without IV contrast on 09/12/2011, which showed no evidence of metastatic disease.  The patient underwent a transbronchial needle biopsy on 08/27/2011 by Dr. Levy Pupa.  The biopsy showed squamous cell carcinoma.  It will be recalled that the original tumor dating back to February 01, 2009, was also squamous cell carcinoma.  However, on the basis of the imaging studies and the clinical scenario, our impression is that this is most likely a 2nd primary lung cancer, which should make the stage T1 N0.  However, the possibility that this is an isolated metastatic focus from the patient's original squamous cell cancer of the left lung dating back to October 2010 cannot be completely excluded.  The patient was seen in consultation by Dr. Kathlee Nations Trigt, who felt that the patient's underlying pulmonary status was too tenuous to consider surgery,  especially if he required a right lower lobectomy. The patient was referred to Dr. [name] for consideration of stereotactic radiosurgery.  The patient apparently is scheduled to begin his treatments on June 24th.  He says that he will be having probably 6 treatments given over 2 weeks with treatments to be given on Monday, Wednesdays and Fridays.  The patient denies any major changes in his condition.  He is clearly somewhat disappointed about the developments of the past few months and the plans for treatment, but he remains optimistic.  He continues to have dyspnea on exertion but otherwise generally feels well and denies any other significant changes.  Occasionally he has some wheezing.  PHYSICAL EXAMINATION:  There is little change.  Weight is 196.3 pounds, height 5 feet 10 inches, body surface area 2.1 sq. m.  Blood pressure 116/64.  Other vital signs are normal.  O2 saturation on room air at rest was 95%.  There is no scleral icterus or evidence of Horner syndrome.  Mouth and pharynx are benign.  Left side of the face is without evidence for recurrent melanoma.  There is no peripheral adenopathy.  The patient remains with decreased breath sounds and dullness at the left base, otherwise lungs seem fairly clear.  Cardiac: Regular rhythm without murmur or rub.  There is a right-sided Port-A- Cath, which will be flushed with heparin today.  This apparently was flushed with heparin at the time of the PET scan on 08/05/2011. Abdomen:  Benign with no organomegaly or masses palpable.  There is a suspicious skin lesion over the right mid back that could be a basal cell carcinoma or squamous cell carcinoma of the skin.  This lesion measures less than a centimeter.  I believe I saw this a few months ago and recommended that the patient see a dermatologist for evaluation. Extremities:  No peripheral edema or clubbing.  Neurologic:  Normal.  LABORATORY DATA:  Today, white count 5.9,  ANC 3.7, hemoglobin 15.2, hematocrit 46.2, platelets 187,000.  Chemistries today are pending. Chemistries from 05/28/2011 were entirely normal.  The patient's last CEA was 1.5 on 08/12/2010.  IMAGING STUDIES:  1. MRI of the head with and without IV contrast from 09/30/2010 was  negative for metastatic disease to the brain. There were no acute  abnormalities.  2. CT scan of the chest with IV contrast from 11/26/2010 showed stable  postsurgical changes in the left hemithorax. There was a stable  cluster of central pulmonary nodules within the right lower lobe.  There is volume loss in the left upper lobe. There is  paramediastinal atelectasis similar to the prior CT scan of  05/03/2012and a prior PET scan from 01/04/2010.  3. Chest x-ray 2-view from 03/14/2011 showed no acute findings. There  were stable findings compared with prior chest x-ray from  09/23/2010. 4. CT scan of the chest with IV contrast on 07/21/2011 showed     increasing conspicuity of the right lower lobe nodule that  was     worrisome for a primary bronchogenic carcinoma.  The lesion     measured 10 x 10 mm.  It was spiculated.  On a prior study from     11/26/2010, the lesion measured 10 x 6 mm.  There was radiation     fibrosis and volume loss as well as a small loculated left pleural     effusion.  Hepatic steatosis was also noted. 5. PET scan from 08/05/2011 showed that the right lower lobe nodule     was hypermetabolic with an SUV max of 3.3.  Biopsy or resection was     recommended.  There was no evidence of mediastinal metastatic     disease or any other areas of hypermetabolic activity. 6. MRI of the head with and without IV contrast from 09/12/2011 showed     mild atrophy and moderate chronic microvascular ischemic change but     no evidence for metastatic disease.  IMPRESSION AND PLAN:  Mr. Rhoda clinically is doing well, although he has a new malignancy in the right lower lobe that most likely is a  2nd primary.  This lesion as well as the 1st tumor was squamous cell carcinoma.  The patient is a former smoker.  The possibility that this is an isolated metastasis cannot be completely excluded.  The patient has no other evidence for metastatic disease.  The lesion is asymptomatic, T1 N0.  Dr. Donata Clay felt that the patient was not a good candidate for surgery, especially if he needed a right lower lobectomy. The patient is about to start stereotactic radiation treatments to this lesion under the direction of Dr. [name].  First treatment is scheduled for Monday, June 24th.  We will flush the Port-A-Cath with heparin today.  We will plan to see Mr. Lohmeyer again in approximately 2 months, in early August, at which time he will be due for a port flush.  We will also check CBC and chemistries and a CEA.  Extensive records were reviewed for this appointment.  In addition, I had reviewed the patient's scans with a radiologist, also his other studies, and arranged for these studies and referrals over the past couple of months.    ______________________________ Samul Dada, M.D. DSM/MEDQ  D:  09/23/2011  T:  09/23/2011  Job:  [id number]

## 2011-09-23 NOTE — Telephone Encounter (Signed)
gv pt appt schedule for August.  °

## 2011-09-23 NOTE — Progress Notes (Signed)
This office note has been dictated.  #[id number]

## 2011-09-26 ENCOUNTER — Ambulatory Visit: Payer: Medicare Other | Admitting: Emergency Medicine

## 2011-09-29 ENCOUNTER — Encounter: Payer: Self-pay | Admitting: Radiation Oncology

## 2011-09-29 ENCOUNTER — Ambulatory Visit
Admission: RE | Admit: 2011-09-29 | Discharge: 2011-09-29 | Disposition: A | Discharge status: Home / Self Care | Payer: Medicare Other | Source: Ambulatory Visit | Attending: Radiation Oncology | Admitting: Radiation Oncology

## 2011-09-29 DIAGNOSIS — Z510 Encounter for antineoplastic radiation therapy: Secondary | ICD-10-CM | POA: Diagnosis not present

## 2011-09-29 DIAGNOSIS — C3410 Malignant neoplasm of upper lobe, unspecified bronchus or lung: Secondary | ICD-10-CM | POA: Diagnosis not present

## 2011-09-29 DIAGNOSIS — C3430 Malignant neoplasm of lower lobe, unspecified bronchus or lung: Secondary | ICD-10-CM | POA: Diagnosis not present

## 2011-09-29 NOTE — Progress Notes (Signed)
  Radiation Oncology         [phone] ________________________________  Name: MARTAVIUS LUSTY MRN: [id number]  Date: 09/29/2011  DOB: Apr 09, 1934  Stereotactic Body Radiotherapy Treatment Procedure Note: Right lower lung cancer  NARRATIVE:  RAYMON SCHLARB was brought to the stereotactic radiation treatment machine and placed supine on the CT couch. The patient was set up for stereotactic body radiotherapy on the body fix pillow.  3D TREATMENT PLANNING AND DOSIMETRY:  The patient's radiation plan was reviewed and approved prior to starting treatment.  It showed 3-dimensional radiation distributions overlaid onto the planning CT.  The [facility] for the target structures as well as the organs at risk were reviewed. The documentation of this is filed in the radiation oncology EMR.  SIMULATION VERIFICATION:  The patient underwent CT imaging on the treatment unit.  These were carefully aligned to document that the ablative radiation dose would cover the target volume and maximally spare the nearby organs at risk according to the planned distribution.  SPECIAL TREATMENT PROCEDURE: [name] received high dose ablative stereotactic body radiotherapy to the planned target volume without unforeseen complications. Treatment was delivered uneventfully. The high doses associated with stereotactic body radiotherapy and the significant potential risks require careful treatment set up and patient monitoring constituting a special treatment procedure   STEREOTACTIC TREATMENT MANAGEMENT:  Following delivery, the patient was evaluated clinically. The patient tolerated treatment without significant acute effects, and was discharged to home in stable condition.    PLAN: Continue treatment as planned.  ________________________________  Lonie Peak, MD

## 2011-10-01 ENCOUNTER — Ambulatory Visit
Admission: RE | Admit: 2011-10-01 | Discharge: 2011-10-01 | Disposition: A | Discharge status: Home / Self Care | Payer: Medicare Other | Source: Ambulatory Visit | Attending: Radiation Oncology | Admitting: Radiation Oncology

## 2011-10-01 ENCOUNTER — Encounter: Payer: Self-pay | Admitting: Radiation Oncology

## 2011-10-01 DIAGNOSIS — C3430 Malignant neoplasm of lower lobe, unspecified bronchus or lung: Secondary | ICD-10-CM | POA: Diagnosis not present

## 2011-10-01 DIAGNOSIS — Z510 Encounter for antineoplastic radiation therapy: Secondary | ICD-10-CM | POA: Diagnosis not present

## 2011-10-03 ENCOUNTER — Ambulatory Visit
Admission: RE | Admit: 2011-10-03 | Discharge: 2011-10-03 | Disposition: A | Discharge status: Home / Self Care | Payer: Medicare Other | Source: Ambulatory Visit | Attending: Radiation Oncology | Admitting: Radiation Oncology

## 2011-10-03 DIAGNOSIS — Z510 Encounter for antineoplastic radiation therapy: Secondary | ICD-10-CM | POA: Diagnosis not present

## 2011-10-03 DIAGNOSIS — C3410 Malignant neoplasm of upper lobe, unspecified bronchus or lung: Secondary | ICD-10-CM

## 2011-10-03 DIAGNOSIS — C3430 Malignant neoplasm of lower lobe, unspecified bronchus or lung: Secondary | ICD-10-CM | POA: Diagnosis not present

## 2011-10-03 NOTE — Progress Notes (Signed)
  Radiation Oncology         [phone] ________________________________  Name: [name] Fields MRN: [id number]  Date: 10/03/2011  DOB: 02-10-1935   Simulation verification note  The patient underwent film verification for the patient's set-up in preparation for stereotactic body radiosurgery. The patient was placed on the treatment unit and a CT scan was performed. These images were then fused with the patient's planning CT scan. The fusion was carefully reviewed in terms of the patient's anatomy as it related to the planning CT scan. The target structures as well as the organs at risk were evaluated on the patient's treatment CT scan. The target and the normal structures were appropriately aligned for treatment. Therefore the patient proceeded with the fraction of stereotactic body radiosurgery. The patient's scan was fairly subtle in terms of the tumor. I do believe that appropriate alignment was achieved based on the soft tissue in this area and surrounding normal structures.  Fraction: 3  Dose:  30 Gy   ________________________________  Radene Gunning, MD, PhD

## 2011-10-06 ENCOUNTER — Ambulatory Visit
Admission: RE | Admit: 2011-10-06 | Discharge: 2011-10-06 | Disposition: A | Discharge status: Home / Self Care | Payer: Medicare Other | Source: Ambulatory Visit | Attending: Radiation Oncology | Admitting: Radiation Oncology

## 2011-10-06 DIAGNOSIS — C3410 Malignant neoplasm of upper lobe, unspecified bronchus or lung: Secondary | ICD-10-CM

## 2011-10-06 DIAGNOSIS — Z510 Encounter for antineoplastic radiation therapy: Secondary | ICD-10-CM | POA: Diagnosis not present

## 2011-10-06 DIAGNOSIS — C3430 Malignant neoplasm of lower lobe, unspecified bronchus or lung: Secondary | ICD-10-CM | POA: Diagnosis not present

## 2011-10-07 NOTE — Progress Notes (Signed)
  Radiation Oncology         [phone] ________________________________  Name: AVINASH MALTOS MRN: [id number]  Date: 10/06/2011  DOB: December 01, 1934   Simulation verification note  The patient underwent film verification for the patient's set-up in preparation for stereotactic body radiosurgery. The patient was placed on the treatment unit and a CT scan was performed. These images were then fused with the patient's planning CT scan. The fusion was carefully reviewed in terms of the patient's anatomy as it related to the planning CT scan. The target structures as well as the organs at risk were evaluated on the patient's treatment CT scan. The target and the normal structures were appropriately aligned for treatment. Therefore the patient proceeded with the fraction of stereotactic body radiosurgery. The patient's scan was fairly subtle in terms of the tumor. I do believe that appropriate alignment was achieved based on the soft tissue in this area and surrounding normal structures.  Fraction: 4  Dose:  40 Gy   ________________________________

## 2011-10-08 ENCOUNTER — Encounter: Payer: Self-pay | Admitting: Radiation Oncology

## 2011-10-08 ENCOUNTER — Ambulatory Visit
Admission: RE | Admit: 2011-10-08 | Discharge: 2011-10-08 | Disposition: A | Discharge status: Home / Self Care | Payer: Medicare Other | Source: Ambulatory Visit | Attending: Radiation Oncology | Admitting: Radiation Oncology

## 2011-10-08 VITALS — BP 120/71 | HR 102 | Temp 97.1°F | Resp 18 | Wt 191.4 lb

## 2011-10-08 DIAGNOSIS — C3430 Malignant neoplasm of lower lobe, unspecified bronchus or lung: Secondary | ICD-10-CM | POA: Diagnosis not present

## 2011-10-08 DIAGNOSIS — C3410 Malignant neoplasm of upper lobe, unspecified bronchus or lung: Secondary | ICD-10-CM

## 2011-10-08 DIAGNOSIS — Z510 Encounter for antineoplastic radiation therapy: Secondary | ICD-10-CM | POA: Diagnosis not present

## 2011-10-08 NOTE — Progress Notes (Signed)
Weekly Management Note Current Dose:50 Gy  Projected Dose:50 Gy   Narrative:  The patient presents for routine under treatment assessment.  CBCT/MVCT images/Port film x-rays were reviewed.  The chart was checked. Had "a bug" after first treatment with decreased appetite and abdominal pain. Feeling better now. No N/V/D.  No abdominal pain. No fevers.  Physical Findings:  Unchanged. Alert and oriented.  Vitals:  Filed Vitals:   10/08/11 1616  BP: 120/71  Pulse: 102  Temp: 97.1 F (36.2 C)  Resp: 18   Weight:  Wt Readings from Last 3 Encounters:  10/08/11 191 lb 6.4 oz (86.818 kg)  09/23/11 196 lb 4.8 oz (89.041 kg)  09/18/11 196 lb 3.2 oz (88.996 kg)   Lab Results  Component Value Date   WBC 5.9 09/23/2011   HGB 15.2 09/23/2011   HCT 46.2 09/23/2011   MCV 93.1 09/23/2011   PLT 187 09/23/2011   Lab Results  Component Value Date   CREATININE 0.93 08/19/2011   BUN 18 08/19/2011   NA 141 08/19/2011   K 3.9 08/19/2011   CL 103 08/19/2011   CO2 29 08/19/2011     Impression:  The patient tolerated radiation well.  Plan:  Follow up in 6 weeks. CT prior to f/u appt. Call if abdominal pain returns.

## 2011-10-08 NOTE — Progress Notes (Signed)
Patient presents to the clinic today unaccompanied following final xrt. Patient is alert and oriented to person, place, and time. No distress noted. Steady gait noted. Pleasant affect noted. Patient reports a "hurt" in the pit of his stomach 2 on a scale of 0-10. Patient denies nausea, vomiting, and diarrhea. Patient reports shortness of breath with exertion. Patient reports a productive cough with thick green sticky sputum. Patient reports he is unable to lay flat to sleep due to dyspnea. Patient denies the use of oxygen therapy. Patient denies having a sore throat. Patient reports decreased appetite and taste changes. Five pound weight loss noted since 6/18. Appointment card given for one month follow up . Reported all findings to Dr. Michell Heinrich.

## 2011-10-13 ENCOUNTER — Telehealth: Payer: Self-pay | Admitting: *Deleted

## 2011-10-13 NOTE — Progress Notes (Signed)
On this date during CT simulation an accuform was constructed for the patient's neck along with the body fix bag

## 2011-10-13 NOTE — Progress Notes (Signed)
  Radiation Oncology         [phone] ________________________________  Name: [name] Fields MRN: [id number]  Date: 10/08/2011  DOB: October 10, 1934  End of Treatment Note  Diagnosis:   T1NO NSCLC Right lower lobe    Indication for treatment:  Curative      Radiation treatment dates:   09/29/2011, 10/01/2011, 10/03/2011, 10/06/2011, 10/08/2011  Site/dose:   Right lower lobe - 50 Gray @ 10 Wallace Cullens per fraction x 5 fractions  Beams/energy:   2 Dynamic Conformal Arcs utilizing 6 MV photons  Narrative: The patient tolerated radiation treatment relatively well.   He had some decreased appetite and abdominal pain towards the end of treatment which was getting better.   Plan: The patient has completed radiation treatment. The patient will return to radiation oncology clinic for routine followup in one month. I advised them to call or return sooner if they have any questions or concerns related to their recovery or treatment.  ------------------------------------------------  [name], MD

## 2011-10-13 NOTE — Progress Notes (Signed)
Name: [name] Fields MRN: [id number]   Date: 10/01/2011      DOB: 08/30/1934   Simulation verification note  The patient underwent film verification for the patient's set-up in preparation for stereotactic body radiosurgery. The patient was placed on the treatment unit and a CT scan was performed. These images were then fused with the patient's planning CT scan. The fusion was carefully reviewed in terms of the patient's anatomy as it related to the planning CT scan. The target structures as well as the organs at risk were evaluated on the patient's treatment CT scan. The target and the normal structures were appropriately aligned for treatment. Therefore the patient proceeded with the fraction of stereotactic body radiosurgery. The patient's scan was fairly subtle in terms of the tumor. I do believe that appropriate alignment was achieved based on the soft tissue in this area and surrounding normal structures.   Fraction: 2  Dose: 20 Wallace Cullens

## 2011-10-13 NOTE — Progress Notes (Signed)
Name: [name] Fields MRN: [id number]   Date: 10/08/2011  DOB: 02/10/1935   Simulation verification note  The patient underwent film verification for the patient's set-up in preparation for stereotactic body radiosurgery. The patient was placed on the treatment unit and a CT scan was performed. These images were then fused with the patient's planning CT scan. The fusion was carefully reviewed in terms of the patient's anatomy as it related to the planning CT scan. The target structures as well as the organs at risk were evaluated on the patient's treatment CT scan. The target and the normal structures were appropriately aligned for treatment. Therefore the patient proceeded with the fraction of stereotactic body radiosurgery. The patient's scan was fairly subtle in terms of the tumor. I do believe that appropriate alignment was achieved based on the soft tissue in this area and surrounding normal structures.   Fraction: 5  Dose:  50 Gy

## 2011-10-13 NOTE — Telephone Encounter (Signed)
Called patient to inform of test, spoke with patient and he is aware of this test. 

## 2011-10-16 ENCOUNTER — Other Ambulatory Visit: Payer: Self-pay | Admitting: Oncology

## 2011-10-16 DIAGNOSIS — C3410 Malignant neoplasm of upper lobe, unspecified bronchus or lung: Secondary | ICD-10-CM

## 2011-11-11 ENCOUNTER — Telehealth: Payer: Self-pay | Admitting: *Deleted

## 2011-11-11 ENCOUNTER — Ambulatory Visit (HOSPITAL_BASED_OUTPATIENT_CLINIC_OR_DEPARTMENT_OTHER): Payer: Medicare Other | Admitting: Oncology

## 2011-11-11 ENCOUNTER — Other Ambulatory Visit (HOSPITAL_BASED_OUTPATIENT_CLINIC_OR_DEPARTMENT_OTHER): Payer: Medicare Other

## 2011-11-11 ENCOUNTER — Encounter: Payer: Self-pay | Admitting: Oncology

## 2011-11-11 VITALS — BP 115/76 | HR 69 | Temp 97.1°F | Resp 18 | Ht 70.0 in | Wt 185.1 lb

## 2011-11-11 DIAGNOSIS — C3410 Malignant neoplasm of upper lobe, unspecified bronchus or lung: Secondary | ICD-10-CM

## 2011-11-11 DIAGNOSIS — R911 Solitary pulmonary nodule: Secondary | ICD-10-CM

## 2011-11-11 LAB — CBC WITH DIFFERENTIAL/PLATELET
Basophils Absolute: 0.1 10*3/uL (ref 0.0–0.1)
Eosinophils Absolute: 0.3 10*3/uL (ref 0.0–0.5)
HGB: 15.4 g/dL (ref 13.0–17.1)
LYMPH%: 13.2 % — ABNORMAL LOW (ref 14.0–49.0)
MCV: 93.1 fL (ref 79.3–98.0)
MONO#: 0.5 10*3/uL (ref 0.1–0.9)
NEUT#: 3.7 10*3/uL (ref 1.5–6.5)
Platelets: 220 10*3/uL (ref 140–400)
RBC: 4.99 10*6/uL (ref 4.20–5.82)
WBC: 5.2 10*3/uL (ref 4.0–10.3)

## 2011-11-11 LAB — COMPREHENSIVE METABOLIC PANEL
Albumin: 4 g/dL (ref 3.5–5.2)
BUN: 14 mg/dL (ref 6–23)
CO2: 27 mEq/L (ref 19–32)
Glucose, Bld: 81 mg/dL (ref 70–99)
Potassium: 4.6 mEq/L (ref 3.5–5.3)
Sodium: 142 mEq/L (ref 135–145)
Total Bilirubin: 0.8 mg/dL (ref 0.3–1.2)
Total Protein: 6.9 g/dL (ref 6.0–8.3)

## 2011-11-11 LAB — CEA: CEA: 1.3 ng/mL (ref 0.0–5.0)

## 2011-11-11 LAB — LACTATE DEHYDROGENASE: LDH: 146 U/L (ref 94–250)

## 2011-11-11 NOTE — Progress Notes (Signed)
This office note has been dictated.  #[id number]

## 2011-11-11 NOTE — Progress Notes (Signed)
CC:   [name], M.D. [name]. [name], M.D. [name], M.D. [name], M.D. [name], MD,FCCP [name], MD    PROBLEM LIST:  1. Squamous cell carcinoma of the left lung, status post FNA on  02/01/2009, stage T4 N2 M0, stage IIIB, involving left upper lobe  and hilar region. The patient was treated with weekly carboplatin  and Taxol in combination with radiation treatments, 6,600 cGy in 33  fractions, directed at the left upper lobe and left hilar region  from 02/22/2009 through 04/12/2009. He then received consolidative  chemotherapy with cisplatin and gemcitabine for 4 cycles from  06/04/2009 through 09/06/2009. He received Neulasta during those  treatments. The patient is no longer on any treatment. His last  PET scan was on 08/05/2011. His last CT scan of the chest was on  07/21/2011. His last 2D echocardiogram was on 11/12/2010.   2. Radiation pneumonitis and pericarditis, status post pericardial  window on 10/25/2009.  3. Hypertension.  4. Dyslipidemia.  5. Depression.  6. Melanoma involving the left face, T3a, status post wide excision on  02/12/2009. Melanoma in situ involving the chest in February 2007.  7. Bilateral hearing loss.  8. Obstructive uropathy.  9. Right Port-A-Cath placed on 07/11/2009.  10.Squamous cell carcinoma in a right lower lobe nodule which was  detected by scheduled surveillance CT scan of the chest carried out  on 07/21/2011, confirmed by PET scan on 08/05/2011 and ultimately  by transbronchial Wang needle biopsy from the right lower lobe on  08/27/2011. This lesion is most likely a new primary lung cancer,  Stage T1N0.  The patient underwent stereotactic radiation beginning on 09/29/2011 through 10/08/2011.  The patient received a total of 5 fractions, 10 Gray per fraction equaling 50 Gray to the right lower lobe lesion.  He tolerated these treatments well. 11.Skin lesion on right mid back, suspicious for  either basal cell  carcinoma or squamous cell carcinoma of the skin detected in the early  months of 2013.    MEDICATIONS:  1. Wellbutrin 150 mg daily.  2. Plavix 75 mg daily.  3. Lopressor 12.5 mg twice a day.  4. Protonix 40 mg twice a day.  5. Flomax 0.4 mg daily.  6. Restoril 15-30 mg h.s.    SMOKING HISTORY:  The patient has been a heavy cigarette smoker, but is no longer smoking.   HISTORY:  [name] Fields was seen today for followup of squamous cell carcinoma of the left lung, stage IIIB with diagnosis going back to October 2010.  [name] Fields also was recently diagnosed with what we think and hope is a 2nd primary involving the right lower lobe, also a squamous cell carcinoma.  This presented as an asymptomatic nodule that was picked up by surveillance CT scan of the chest and confirmed by a transbronchial Wang needle biopsy.  The patient also had a PET scan carried out on 08/05/2011 that showed only this lesion.  As noted above the patient has undergone stereotactic radiation under the direction of Dr. [name], total of 50 Wallace Cullens given over 5 fractions from 09/29/2011 through 10/08/2011.  The patient has not felt very hungry and has lost some weight, although he was overweight at the time.  He has lost about 10 pounds over the past few months.  He says his appetite is improving.  He continues to have dyspnea on exertion and dry cough without any significant change.  He denies any hemoptysis or any chest  pain or any other pain.  He does have some wheezing.  He is having some problems with constipation and we talked about that.  I wrote down some potential remedies such as Senokot S and fiber products with suggested recommendations.  The patient in general feels well and is without any specific complaints.  He tells me that he will be undergoing a CT scan of the chest on Thursday August 8.  This scan was ordered by Dr. Michell Heinrich.  PHYSICAL EXAM:  [name] Fields  looks well.  Weight is 185.1 pounds, height 5 feet 10 inches, body surface area 2.04 sq2.  Blood pressure 115/76. Other vital signs are normal.  O2 saturation on room air at rest was 96%.  There is no scleral icterus.  Mouth and pharynx are benign.  Left face and neck are benign with regard to his previous melanoma.  Lungs reveal some end expiratory wheezing bilaterally.  He also has some decreased breath sounds and dullness at the left base.  Cardiac exam regular rhythm without murmur or rub.  He has a right-sided Port-A-Cath which was flushed with heparin today.  We are maintaining this with heparin flushes every 2 months.  Abdomen is benign with no organomegaly or masses palpable.  He continues to have a skin lesion over the right mid back which we have pointed out to him as a possible basal cell or squamous cell carcinoma of the skin.  This lesion measures about 1 cm. Extremities:  No peripheral edema or clubbing.  Neurologic exam is normal.  LABORATORY DATA:  Today, white count 5.2, ANC 3.7, hemoglobin 15.4, hematocrit 46.5, platelets 220,000.  Chemistries and CEA today are pending.  Chemistries from 08/19/2011 were normal.  Chemistries from 05/28/2011 were normal.  The last CEA we checked was back on 08/12/2010 and was 1.5.  CEA is being checked today.  IMAGING STUDIES:  1. MRI of the head with and without IV contrast from 09/30/2010 was  negative for metastatic disease to the brain. There were no acute  abnormalities.  2. CT scan of the chest with IV contrast from 11/26/2010 showed stable  postsurgical changes in the left hemithorax. There was a stable  cluster of central pulmonary nodules within the right lower lobe.  There is volume loss in the left upper lobe. There is  paramediastinal atelectasis similar to the prior CT scan of  05/03/2012and a prior PET scan from 01/04/2010.  3. Chest x-ray 2-view from 03/14/2011 showed no acute findings. There  were stable findings  compared with prior chest x-ray from  09/23/2010.  4. CT scan of the chest with IV contrast on 07/21/2011 showed  increasing conspicuity of the right lower lobe nodule that was  worrisome for a primary bronchogenic carcinoma. The lesion  measured 10 x 10 mm. It was spiculated. On a prior study from  11/26/2010, the lesion measured 10 x 6 mm. There was radiation  fibrosis and volume loss as well as a small loculated left pleural  effusion. Hepatic steatosis was also noted.  5. PET scan from 08/05/2011 showed that the right lower lobe nodule  was hypermetabolic with an SUV max of 3.3. Biopsy or resection was  recommended. There was no evidence of mediastinal metastatic  disease or any other areas of hypermetabolic activity.  6. MRI of the head with and without IV contrast from 09/12/2011 showed  mild atrophy and moderate chronic microvascular ischemic change but  no evidence for metastatic disease.   IMPRESSION AND PLAN:  Mr.  Fields seems to be doing fairly well at the present time.  The new malignancy which we think and hope is a 2nd primary, basically a T1 N0 lesion, has been treated with stereotactic radiation as described above.  The patient is going to have a CT scan of the chest on Thursday August 8.  The patient's Port-A-Cath was flushed with heparin today.  We will plan to see him again in 2 months, which will be early October.  We will check CBC and chemistries at that time.  He will be due for another Port- A-Cath flush.  It should be noted that the patient is approaching 3 years from the time of diagnosis of his locally advanced cancer involving the left lung, stage IIIB.    ______________________________ Samul Dada, M.D. DSM/MEDQ  D:  11/11/2011  T:  11/11/2011  Job:  [id number]

## 2011-11-11 NOTE — Telephone Encounter (Signed)
Gave patient appointment for 01-12-2012 starting at 9:30am lab md with flush

## 2011-11-13 ENCOUNTER — Ambulatory Visit (HOSPITAL_COMMUNITY)
Admission: RE | Admit: 2011-11-13 | Discharge: 2011-11-13 | Disposition: A | Discharge status: Home / Self Care | Payer: Medicare Other | Source: Ambulatory Visit | Attending: Radiation Oncology | Admitting: Radiation Oncology

## 2011-11-13 DIAGNOSIS — J984 Other disorders of lung: Secondary | ICD-10-CM | POA: Insufficient documentation

## 2011-11-13 DIAGNOSIS — Z9221 Personal history of antineoplastic chemotherapy: Secondary | ICD-10-CM | POA: Diagnosis not present

## 2011-11-13 DIAGNOSIS — Z923 Personal history of irradiation: Secondary | ICD-10-CM | POA: Insufficient documentation

## 2011-11-13 DIAGNOSIS — K7689 Other specified diseases of liver: Secondary | ICD-10-CM | POA: Diagnosis not present

## 2011-11-13 DIAGNOSIS — C3490 Malignant neoplasm of unspecified part of unspecified bronchus or lung: Secondary | ICD-10-CM | POA: Diagnosis not present

## 2011-11-13 DIAGNOSIS — R0602 Shortness of breath: Secondary | ICD-10-CM | POA: Insufficient documentation

## 2011-11-13 DIAGNOSIS — C3410 Malignant neoplasm of upper lobe, unspecified bronchus or lung: Secondary | ICD-10-CM

## 2011-11-13 DIAGNOSIS — R911 Solitary pulmonary nodule: Secondary | ICD-10-CM | POA: Diagnosis not present

## 2011-11-13 MED ORDER — IOHEXOL 300 MG/ML  SOLN
100.0000 mL | Freq: Once | INTRAMUSCULAR | Status: AC | PRN
  Administered 2011-11-13: 100 mL via INTRAVENOUS

## 2011-11-20 ENCOUNTER — Ambulatory Visit
Admission: RE | Admit: 2011-11-20 | Discharge: 2011-11-20 | Disposition: A | Discharge status: Home / Self Care | Payer: Medicare Other | Source: Ambulatory Visit | Attending: Radiation Oncology | Admitting: Radiation Oncology

## 2011-11-20 ENCOUNTER — Encounter: Payer: Self-pay | Admitting: Radiation Oncology

## 2011-11-20 VITALS — BP 117/76 | HR 66 | Temp 97.0°F | Resp 20

## 2011-11-20 DIAGNOSIS — C3410 Malignant neoplasm of upper lobe, unspecified bronchus or lung: Secondary | ICD-10-CM

## 2011-11-20 NOTE — Progress Notes (Signed)
Patient alert,oriented x3, follow up RLL lobe rad txs, staeady gait, no c/o sob ,still has a non productive cough, no c/o pain ,eating fair, sometimes not much appeals to him food wise"I need to lose some weight anyway", patient walk for 1 hour daily 5 days a week, still a little fatigued, no difficulty swallowing or chewing,no nausea 10:38 AM

## 2011-11-20 NOTE — Progress Notes (Signed)
   Department of Radiation Oncology  Phone:  [phone] Fax:        [phone]   Name: [name] Fields   DOB: 08/26/1934  MRN: [id number]    Date: 11/20/2011  Follow Up Visit Note  Diagnosis: History of stage III non-small cell lung cancer with a new T1 N0 right lower lobe non-small cell lung cancer status post SBRT  Interval since last radiation: 6 weeks  Interval History: [name] Fields presents today for routine followup.  He is feeling well and doing well. He continues to walk about an hour a day. His shortness of breath is stable. He is appointment Dr. Lorain Childes in October. He's noticed no hemoptysis or increasing fatigue.  Allergies:  Allergies  Allergen Reactions  . Sertraline Hcl Other (See Comments)    unknown    Medications:  Current Outpatient Prescriptions  Medication Sig Dispense Refill  . buPROPion (WELLBUTRIN SR) 150 MG 12 hr tablet Take 150 mg by mouth daily.       . clopidogrel (PLAVIX) 75 MG tablet Take 1 tablet (75 mg total) by mouth daily. TO BE RESTARTED ON FRIDAY 08/29/11      . metoprolol tartrate (LOPRESSOR) 25 MG tablet Take 12.5 mg by mouth 2 (two) times daily.      . pantoprazole (PROTONIX) 40 MG tablet TAKE ONE TABLET BY MOUTH TWICE DAILY  60 tablet  1  . Tamsulosin HCl (FLOMAX) 0.4 MG CAPS Take 0.4 mg by mouth daily with supper.       . temazepam (RESTORIL) 30 MG capsule Take 30 mg by mouth at bedtime as needed. For sleep        Physical Exam:   oral temperature is 97 F (36.1 C). His blood pressure is 117/76 and his pulse is 66. His respiration is 20 and oxygen saturation is 98%.  Is a pleasant male in no distress sitting comfortably examining chair. His strength is 5 out of 5 in his bilateral upper and lower extremities.  IMPRESSION: [name] Fields is a 77 y.o. male who has undergone SBRT in addition to previous 3-D conformal radiation for locally advanced lung cancer. The CT recently showed complete resolution of the right lower lobe mass. Only some  radiation changes were left behind.  PLAN:  Followup looks great. I will plan on seeing him back in 6 months with a CT at that time. You also continued regular scheduled followup Dr. Arline Asp.    [name], MD

## 2011-11-24 ENCOUNTER — Telehealth: Payer: Self-pay | Admitting: Oncology

## 2011-11-24 ENCOUNTER — Telehealth: Payer: Self-pay | Admitting: *Deleted

## 2011-11-24 NOTE — Telephone Encounter (Signed)
l/m for pt to call for nut appt

## 2011-11-24 NOTE — Telephone Encounter (Signed)
CALLED PATIENT TO INFORM OF TEST, SPOKE WITH PATIENT AND HE IS AWARE OF THIS TEST. 

## 2011-12-01 ENCOUNTER — Other Ambulatory Visit: Payer: Self-pay | Admitting: Internal Medicine

## 2011-12-01 DIAGNOSIS — L989 Disorder of the skin and subcutaneous tissue, unspecified: Secondary | ICD-10-CM | POA: Diagnosis not present

## 2011-12-01 DIAGNOSIS — C44519 Basal cell carcinoma of skin of other part of trunk: Secondary | ICD-10-CM | POA: Diagnosis not present

## 2011-12-11 ENCOUNTER — Other Ambulatory Visit: Payer: Self-pay | Admitting: Internal Medicine

## 2011-12-11 DIAGNOSIS — L989 Disorder of the skin and subcutaneous tissue, unspecified: Secondary | ICD-10-CM | POA: Diagnosis not present

## 2011-12-11 DIAGNOSIS — C4441 Basal cell carcinoma of skin of scalp and neck: Secondary | ICD-10-CM | POA: Diagnosis not present

## 2011-12-18 DIAGNOSIS — C44519 Basal cell carcinoma of skin of other part of trunk: Secondary | ICD-10-CM | POA: Diagnosis not present

## 2011-12-18 DIAGNOSIS — I2510 Atherosclerotic heart disease of native coronary artery without angina pectoris: Secondary | ICD-10-CM | POA: Diagnosis not present

## 2011-12-23 ENCOUNTER — Other Ambulatory Visit: Payer: Self-pay | Admitting: Oncology

## 2011-12-29 DIAGNOSIS — H5710 Ocular pain, unspecified eye: Secondary | ICD-10-CM | POA: Diagnosis not present

## 2011-12-30 DIAGNOSIS — B0230 Zoster ocular disease, unspecified: Secondary | ICD-10-CM | POA: Diagnosis not present

## 2012-01-12 ENCOUNTER — Ambulatory Visit (HOSPITAL_BASED_OUTPATIENT_CLINIC_OR_DEPARTMENT_OTHER): Payer: Medicare Other | Admitting: Oncology

## 2012-01-12 ENCOUNTER — Encounter: Payer: Self-pay | Admitting: Oncology

## 2012-01-12 ENCOUNTER — Other Ambulatory Visit (HOSPITAL_BASED_OUTPATIENT_CLINIC_OR_DEPARTMENT_OTHER): Payer: Medicare Other | Admitting: Lab

## 2012-01-12 ENCOUNTER — Telehealth: Payer: Self-pay | Admitting: Oncology

## 2012-01-12 VITALS — BP 105/68 | HR 68 | Temp 96.6°F | Resp 18 | Ht 70.0 in | Wt 181.9 lb

## 2012-01-12 DIAGNOSIS — C3410 Malignant neoplasm of upper lobe, unspecified bronchus or lung: Secondary | ICD-10-CM

## 2012-01-12 DIAGNOSIS — C4330 Malignant melanoma of unspecified part of face: Secondary | ICD-10-CM | POA: Diagnosis not present

## 2012-01-12 DIAGNOSIS — C3490 Malignant neoplasm of unspecified part of unspecified bronchus or lung: Secondary | ICD-10-CM

## 2012-01-12 LAB — CBC WITH DIFFERENTIAL/PLATELET
BASO%: 1.1 % (ref 0.0–2.0)
EOS%: 1.9 % (ref 0.0–7.0)
MCH: 30.9 pg (ref 27.2–33.4)
MCHC: 33.5 g/dL (ref 32.0–36.0)
RDW: 14.6 % (ref 11.0–14.6)
WBC: 6.9 10*3/uL (ref 4.0–10.3)
lymph#: 0.8 10*3/uL — ABNORMAL LOW (ref 0.9–3.3)

## 2012-01-12 LAB — COMPREHENSIVE METABOLIC PANEL (CC13)
ALT: 14 U/L (ref 0–55)
AST: 19 U/L (ref 5–34)
Albumin: 3.7 g/dL (ref 3.5–5.0)
Alkaline Phosphatase: 106 U/L (ref 40–150)
Potassium: 4.5 mEq/L (ref 3.5–5.1)
Sodium: 141 mEq/L (ref 136–145)
Total Protein: 6.8 g/dL (ref 6.4–8.3)

## 2012-01-12 LAB — LACTATE DEHYDROGENASE (CC13): LDH: 160 U/L (ref 125–220)

## 2012-01-12 MED ORDER — SODIUM CHLORIDE 0.9 % IJ SOLN
10.0000 mL | INTRAMUSCULAR | Status: DC | PRN
  Administered 2012-01-12: 10 mL via INTRAVENOUS
  Filled 2012-01-12: qty 10

## 2012-01-12 MED ORDER — HEPARIN SOD (PORK) LOCK FLUSH 100 UNIT/ML IV SOLN
500.0000 [IU] | Freq: Once | INTRAVENOUS | Status: AC
  Administered 2012-01-12: 500 [IU] via INTRAVENOUS
  Filled 2012-01-12: qty 5

## 2012-01-12 NOTE — Progress Notes (Signed)
This office note has been dictated.  #[id number]

## 2012-01-12 NOTE — Progress Notes (Signed)
CC:   [name], M.D. [name]. [name], M.D. [name], M.D. [name], M.D. [name], MD,FCCP [name], MD  PROBLEM LIST:  1. Squamous cell carcinoma of the left lung, status post FNA on  02/01/2009, stage T4 N2 M0, stage IIIB, involving left upper lobe  and hilar region. The patient was treated with weekly carboplatin  and Taxol in combination with radiation treatments, 6,600 cGy in 33  fractions, directed at the left upper lobe and left hilar region  from 02/22/2009 through 04/12/2009. He then received consolidative  chemotherapy with cisplatin and gemcitabine for 4 cycles from  06/04/2009 through 09/06/2009. He received Neulasta during those  treatments. The patient is no longer on any treatment. His last  PET scan was on 08/05/2011. His last CT scan of the chest was on  11/13/2011. His last 2D echocardiogram was on 11/12/2010.  2. Radiation pneumonitis and pericarditis, status post pericardial  window on 10/25/2009.  3. Hypertension.  4. Dyslipidemia.  5. Depression.  6. Melanoma involving the left face, T3a, status post wide excision on  02/12/2009. Melanoma in situ involving the chest in February 2007.  7. Bilateral hearing loss.  8. Obstructive uropathy.  9. Right Port-A-Cath placed on 07/11/2009.  10.Squamous cell carcinoma in a right lower lobe nodule which was  detected by scheduled surveillance CT scan of the chest carried out  on 07/21/2011, confirmed by PET scan on 08/05/2011 and ultimately  by transbronchial Wang needle biopsy from the right lower lobe on  08/27/2011. This lesion is most likely a new primary lung cancer,  Stage T1N0. The patient underwent stereotactic radiation beginning on 09/29/2011 through 10/08/2011. The patient received a total of 5 fractions, 10 Gray per fraction equaling 50 Gray to the right lower lobe lesion. He tolerated  these treatments well.  11.Skin lesion on right mid back, suspicious for either  basal cell  carcinoma or squamous cell carcinoma of the skin detected in the early  months of 2013. 12.Herpes zoster outbreak involving the 1st branch of the left cranial nerve V in mid September and treated with Valtrex and ointment for the left eye.    MEDICATIONS:  1. Wellbutrin 150 mg daily.  2. Plavix 75 mg daily.  3. Lopressor 12.5 mg twice a day.  4. Protonix 40 mg twice a day.  5. Flomax 0.4 mg daily.  6. Restoril 15-30 mg h.s.   Pneumovax was given on 01/05/2010.   Flu shot was given on 12/07/2010. The patient will receive this from Dr. Kevan Ny later this week.   SMOKING HISTORY:  The patient smoked up to 3 packs of cigarettes a day from age 72 until 4 when he stopped smoking.  HISTORY:  I saw [name] Fields today for followup of his squamous cell carcinoma of the left lung, stage IIIB with diagnosis going back to October 2010.  [name] Fields was also recently treated with stereotactic radiation to what we believe is a 2nd primary involving the right lower lobe, also a biopsy proven squamous cell carcinoma.  This was detected by surveillance CT scan of the chest and confirmed by a transbronchial Wang needle biopsy.  The patient was last seen by [place] on 11/11/2011.  About 3 or 4 weeks ago he developed shingles involving the 1st branch of the left 5th cranial nerve, treated with Valtrex and some eye ointment that he received from his ophthalmologist.  The patient is making a nice recovery from the shingles, however, he has discomfort  and itching involving his left frontal scalp.  He had a CT scan of the chest on 08/08 that showed resolution of the treated lesion in the right lower lobe.  The patient is trying to lose weight.  He is without any major complaints.  He is having some skin lesions removed by Dr. Kevan Ny.  These are probably skin cancers.  The patient's respiratory status appears to be stable.  PHYSICAL EXAM:  [name] Fields looks well and is in good spirits.   Weight is 181.9 pounds.  He has been gradually trying to lose weight.  His weight back in mid June was about 196 pounds.  Height 5 feet 10 inches, body surface area 2.02 m2.  Blood pressure 105/68.  Other vital signs are normal.  There is no scleral icterus.  He does have some scabbed lesions over his left forehead and scalp.  The left eye looks fine.  No erythema.  Pupils are equal.  Extraocular movements are intact.  Mouth and pharynx are benign.  No peripheral adenopathy palpable.  There is dullness and decreased or even absent breath sounds at the left base. Lungs are essentially clear.  Cardiac exam regular rhythm without murmur or rub.  There is a right-sided Port-A-Cath that was flushed with heparin today.  We are maintaining this with heparin flushes every 2 months.  Some of the suspicious lesions from the back have been removed. Abdomen is benign.  Extremities:  No peripheral edema or clubbing. Neurologic exam is normal.  LABORATORY DATA:  Today, white count 6.9, ANC 5.2, hemoglobin 15.6, hematocrit 46.6 and platelets 247,000.  Chemistries today were normal. CEA from 11/11/2011 was 1.3.   IMAGING STUDIES:  1. MRI of the head with and without IV contrast from 09/30/2010 was  negative for metastatic disease to the brain. There were no acute  abnormalities.  2. CT scan of the chest with IV contrast from 11/26/2010 showed stable  postsurgical changes in the left hemithorax. There was a stable  cluster of central pulmonary nodules within the right lower lobe.  There is volume loss in the left upper lobe. There is  paramediastinal atelectasis similar to the prior CT scan of  05/03/2012and a prior PET scan from 01/04/2010.  3. Chest x-ray 2-view from 03/14/2011 showed no acute findings. There  were stable findings compared with prior chest x-ray from  09/23/2010.  4. CT scan of the chest with IV contrast on 07/21/2011 showed  increasing conspicuity of the right lower lobe nodule  that was  worrisome for a primary bronchogenic carcinoma. The lesion  measured 10 x 10 mm. It was spiculated. On a prior study from  11/26/2010, the lesion measured 10 x 6 mm. There was radiation  fibrosis and volume loss as well as a small loculated left pleural  effusion. Hepatic steatosis was also noted.  5. PET scan from 08/05/2011 showed that the right lower lobe nodule  was hypermetabolic with an SUV max of 3.3. Biopsy or resection was  recommended. There was no evidence of mediastinal metastatic  disease or any other areas of hypermetabolic activity.  6. MRI of the head with and without IV contrast from 09/12/2011 showed  mild atrophy and moderate chronic microvascular ischemic change but  no evidence for metastatic disease. 7. Chest CT scan with IV contrast on 11/13/2011 showed resolution of the central right lower lobe nodule since the previous study of 07/21/2011.  There was new airspace disease in the right lower lobe and anterior left lung.  Differential diagnosis include radiation pneumonitis, drug reaction and atypical infection.  There was a small stable pericardial effusion versus pericardial thickening, and there was stable hepatic steatosis.   IMPRESSION AND PLAN:  [name] Fields seems to be doing well from the standpoint of his lung cancers.  He is getting over the shingles that affected his left scalp and left eyelid.  Fortunately there does not appear to have been involvement of the left eye.  He is having some skin cancers removed.  The CT scan from 08/08 showed resolution of the treated right lower lobe nodule.  Apparently the patient is scheduled to have another CT scan ordered by Dr. Michell Heinrich in mid February 2014.  The Port- A-Cath was flushed with heparin today.  We will continue to flush the Port-A-Cath every 2 months.  We will plan to see [name] Fields again in 2 months at which time we will check CBC, chemistries and  CEA.    ______________________________ Samul Dada, M.D. DSM/MEDQ  D:  01/12/2012  T:  01/12/2012  Job:  [id number]

## 2012-01-12 NOTE — Telephone Encounter (Signed)
appts made and printed for pt aom °

## 2012-01-15 ENCOUNTER — Telehealth: Payer: Self-pay

## 2012-01-15 DIAGNOSIS — Z0000 Encounter for general adult medical examination without abnormal findings: Secondary | ICD-10-CM | POA: Diagnosis not present

## 2012-01-15 DIAGNOSIS — Z23 Encounter for immunization: Secondary | ICD-10-CM | POA: Diagnosis not present

## 2012-01-15 DIAGNOSIS — L570 Actinic keratosis: Secondary | ICD-10-CM | POA: Diagnosis not present

## 2012-01-15 DIAGNOSIS — C44519 Basal cell carcinoma of skin of other part of trunk: Secondary | ICD-10-CM | POA: Diagnosis not present

## 2012-01-15 NOTE — Telephone Encounter (Signed)
lvm that his claim form was signed and ready to pick up.

## 2012-01-16 ENCOUNTER — Encounter: Payer: Self-pay | Admitting: Oncology

## 2012-01-16 NOTE — Progress Notes (Signed)
Patient came into office inquiring about [employer]. Advised him would need to send all to them. He had all of their forms signed. I also gave him a financial application for [place]. He lots of bills from various places. I advised him [facility] would take our discount, all others he has to make contact with them. He will bring me back his application when completed. He really wanted all of the [employer] to be done 1st. He will get done and back to me.

## 2012-01-17 ENCOUNTER — Other Ambulatory Visit: Payer: Self-pay | Admitting: Oncology

## 2012-01-19 DIAGNOSIS — I10 Essential (primary) hypertension: Secondary | ICD-10-CM | POA: Diagnosis not present

## 2012-01-19 DIAGNOSIS — I2510 Atherosclerotic heart disease of native coronary artery without angina pectoris: Secondary | ICD-10-CM | POA: Diagnosis not present

## 2012-01-19 DIAGNOSIS — E782 Mixed hyperlipidemia: Secondary | ICD-10-CM | POA: Diagnosis not present

## 2012-01-19 DIAGNOSIS — I4460 Unspecified fascicular block: Secondary | ICD-10-CM | POA: Diagnosis not present

## 2012-01-20 ENCOUNTER — Telehealth: Payer: Self-pay | Admitting: *Deleted

## 2012-01-20 DIAGNOSIS — S0100XA Unspecified open wound of scalp, initial encounter: Secondary | ICD-10-CM | POA: Diagnosis not present

## 2012-01-20 NOTE — Telephone Encounter (Signed)
Message left for pharmacy that this was filled on January 17, 2012 and refills should be on patient's profile.

## 2012-02-12 ENCOUNTER — Other Ambulatory Visit: Payer: Self-pay | Admitting: *Deleted

## 2012-02-12 DIAGNOSIS — C383 Malignant neoplasm of mediastinum, part unspecified: Secondary | ICD-10-CM

## 2012-02-12 MED ORDER — TEMAZEPAM 15 MG PO CAPS: ORAL_CAPSULE | ORAL | Status: DC

## 2012-03-08 ENCOUNTER — Other Ambulatory Visit: Payer: Medicare Other | Admitting: Lab

## 2012-03-08 ENCOUNTER — Ambulatory Visit: Payer: Medicare Other | Admitting: Oncology

## 2012-03-14 ENCOUNTER — Other Ambulatory Visit: Payer: Self-pay | Admitting: Oncology

## 2012-03-14 DIAGNOSIS — C3410 Malignant neoplasm of upper lobe, unspecified bronchus or lung: Secondary | ICD-10-CM

## 2012-03-15 DIAGNOSIS — M1710 Unilateral primary osteoarthritis, unspecified knee: Secondary | ICD-10-CM | POA: Diagnosis not present

## 2012-03-15 DIAGNOSIS — I2510 Atherosclerotic heart disease of native coronary artery without angina pectoris: Secondary | ICD-10-CM | POA: Diagnosis not present

## 2012-03-15 DIAGNOSIS — I10 Essential (primary) hypertension: Secondary | ICD-10-CM | POA: Diagnosis not present

## 2012-03-15 DIAGNOSIS — Z79899 Other long term (current) drug therapy: Secondary | ICD-10-CM | POA: Diagnosis not present

## 2012-03-15 DIAGNOSIS — Z0000 Encounter for general adult medical examination without abnormal findings: Secondary | ICD-10-CM | POA: Diagnosis not present

## 2012-03-15 DIAGNOSIS — J449 Chronic obstructive pulmonary disease, unspecified: Secondary | ICD-10-CM | POA: Diagnosis not present

## 2012-03-15 DIAGNOSIS — R5382 Chronic fatigue, unspecified: Secondary | ICD-10-CM | POA: Diagnosis not present

## 2012-03-15 DIAGNOSIS — Z1331 Encounter for screening for depression: Secondary | ICD-10-CM | POA: Diagnosis not present

## 2012-03-15 DIAGNOSIS — E782 Mixed hyperlipidemia: Secondary | ICD-10-CM | POA: Diagnosis not present

## 2012-03-15 DIAGNOSIS — F329 Major depressive disorder, single episode, unspecified: Secondary | ICD-10-CM | POA: Diagnosis not present

## 2012-03-15 DIAGNOSIS — N402 Nodular prostate without lower urinary tract symptoms: Secondary | ICD-10-CM | POA: Diagnosis not present

## 2012-03-23 DIAGNOSIS — E782 Mixed hyperlipidemia: Secondary | ICD-10-CM | POA: Diagnosis not present

## 2012-03-26 ENCOUNTER — Ambulatory Visit (HOSPITAL_BASED_OUTPATIENT_CLINIC_OR_DEPARTMENT_OTHER): Payer: Medicare Other | Admitting: Oncology

## 2012-03-26 ENCOUNTER — Encounter: Payer: Self-pay | Admitting: Oncology

## 2012-03-26 ENCOUNTER — Telehealth: Payer: Self-pay | Admitting: Oncology

## 2012-03-26 ENCOUNTER — Other Ambulatory Visit (HOSPITAL_BASED_OUTPATIENT_CLINIC_OR_DEPARTMENT_OTHER): Payer: Medicare Other | Admitting: Lab

## 2012-03-26 ENCOUNTER — Ambulatory Visit: Payer: Medicare Other

## 2012-03-26 VITALS — BP 122/75 | HR 61 | Temp 97.0°F | Resp 20 | Ht 70.0 in | Wt 184.6 lb

## 2012-03-26 VITALS — BP 117/60 | HR 60 | Temp 97.4°F

## 2012-03-26 DIAGNOSIS — R911 Solitary pulmonary nodule: Secondary | ICD-10-CM

## 2012-03-26 DIAGNOSIS — C3410 Malignant neoplasm of upper lobe, unspecified bronchus or lung: Secondary | ICD-10-CM

## 2012-03-26 DIAGNOSIS — C3490 Malignant neoplasm of unspecified part of unspecified bronchus or lung: Secondary | ICD-10-CM

## 2012-03-26 LAB — CBC WITH DIFFERENTIAL/PLATELET
Basophils Absolute: 0.1 10*3/uL (ref 0.0–0.1)
HCT: 45.8 % (ref 38.4–49.9)
HGB: 15.2 g/dL (ref 13.0–17.1)
MONO#: 0.5 10*3/uL (ref 0.1–0.9)
NEUT%: 70.3 % (ref 39.0–75.0)
Platelets: 201 10*3/uL (ref 140–400)
WBC: 5.6 10*3/uL (ref 4.0–10.3)
lymph#: 0.9 10*3/uL (ref 0.9–3.3)

## 2012-03-26 LAB — COMPREHENSIVE METABOLIC PANEL (CC13)
BUN: 18 mg/dL (ref 7.0–26.0)
CO2: 28 mEq/L (ref 22–29)
Calcium: 9.3 mg/dL (ref 8.4–10.4)
Chloride: 101 mEq/L (ref 98–107)
Creatinine: 0.9 mg/dL (ref 0.7–1.3)
Glucose: 77 mg/dl (ref 70–99)

## 2012-03-26 LAB — LACTATE DEHYDROGENASE (CC13): LDH: 164 U/L (ref 125–245)

## 2012-03-26 MED ORDER — HEPARIN SOD (PORK) LOCK FLUSH 100 UNIT/ML IV SOLN
500.0000 [IU] | Freq: Once | INTRAVENOUS | Status: AC
  Administered 2012-03-26: 500 [IU] via INTRAVENOUS
  Filled 2012-03-26: qty 5

## 2012-03-26 MED ORDER — SODIUM CHLORIDE 0.9 % IJ SOLN
10.0000 mL | INTRAMUSCULAR | Status: DC | PRN
  Administered 2012-03-26: 10 mL via INTRAVENOUS
  Filled 2012-03-26: qty 10

## 2012-03-26 NOTE — Telephone Encounter (Signed)
Gave pt appt for January 2014 lab and ML with flush

## 2012-03-26 NOTE — Progress Notes (Signed)
This office note has been dictated.  #[id number]

## 2012-03-27 LAB — CEA: CEA: 0.9 ng/mL (ref 0.0–5.0)

## 2012-03-27 NOTE — Progress Notes (Signed)
CC:   [name], M.D. [name]. [name], M.D. [name], M.D. [name], M.D. [name], MD,FCCP [name], MD  PROBLEM LIST:  1. Squamous cell carcinoma of the left lung, status post FNA on  02/01/2009, stage T4 N2 M0, stage IIIB, involving left upper lobe  and hilar region. The patient was treated with weekly carboplatin  and Taxol in combination with radiation treatments, 6,600 cGy in 33  fractions, directed at the left upper lobe and left hilar region  from 02/22/2009 through 04/12/2009. He then received consolidative  chemotherapy with cisplatin and gemcitabine for 4 cycles from  06/04/2009 through 09/06/2009. He received Neulasta during those  treatments. The patient is no longer on any treatment. His last  PET scan was on 08/05/2011. His last CT scan of the chest was on  11/13/2011. His last 2D echocardiogram was on 11/12/2010.  2. Radiation pneumonitis and pericarditis, status post pericardial  window on 10/25/2009.  3. Hypertension.  4. Dyslipidemia.  5. Depression.  6. Melanoma involving the left face, T3a, status post wide excision on  02/12/2009. Melanoma in situ involving the chest in February 2007.  7. Bilateral hearing loss.  8. Obstructive uropathy.  9. Right Port-A-Cath placed on 07/11/2009.  10.Squamous cell carcinoma in a right lower lobe nodule which was  detected by scheduled surveillance CT scan of the chest carried out  on 07/21/2011, confirmed by PET scan on 08/05/2011 and ultimately  by transbronchial Wang needle biopsy from the right lower lobe on  08/27/2011. This lesion is most likely a new primary lung cancer,  Stage T1N0. The patient underwent stereotactic radiation beginning on 09/29/2011 through 10/08/2011. The patient received a total of 5 fractions, 10 Gray per fraction equaling 50 Gray to the right lower lobe lesion. He tolerated  these treatments well.  11.Skin lesion on right mid back, suspicious for either  basal cell  carcinoma or squamous cell carcinoma of the skin detected in the early  months of 2013.  12.Herpes zoster outbreak involving the 1st branch of the left cranial  nerve V in mid September and treated with Valtrex and ointment  for the left eye.   MEDICATIONS:  Were reviewed and recorded.  Current Outpatient Prescriptions  Medication Sig Dispense Refill  . buPROPion (WELLBUTRIN SR) 150 MG 12 hr tablet Take 150 mg by mouth daily.       . clopidogrel (PLAVIX) 75 MG tablet Take 1 tablet (75 mg total) by mouth daily. TO BE RESTARTED ON FRIDAY 08/29/11      . desvenlafaxine (PRISTIQ) 50 MG 24 hr tablet Take 50 mg by mouth daily.      . metoprolol tartrate (LOPRESSOR) 25 MG tablet Take 12.5 mg by mouth 2 (two) times daily.      . pantoprazole (PROTONIX) 40 MG tablet TAKE ONE TABLET BY MOUTH TWICE DAILY  60 tablet  0  . Tamsulosin HCl (FLOMAX) 0.4 MG CAPS Take 0.4 mg by mouth daily with supper.       . temazepam (RESTORIL) 15 MG capsule TAKE ONE CAPSULE AT BEDTIME AS NEEDED FOR SLEEP. MAY REPEAT TIMES ONE.  60 capsule  0   IMMUNIZATIONS:   Pneumovax was given on 01/05/2010.   Flu shot was given in October 2013.  SMOKING HISTORY:  Will be extracted.  HISTORY:  [name] Fields was seen today for followup of his squamous cell carcinoma of the left lung, stage IIIB, with diagnosis going back to October 2010.  Mr. Hollerbach was  also recently treated with stereotactic radiation to what we believe is a second primary involving the right lower lobe.  This was also a biopsy-proven squamous cell carcinoma.  This was detected by a surveillance CT scan of the chest and confirmed by a transbronchial Wang needle biopsy.  The patient was last seen by [place] on 01/12/2012.  He tells me that shortly after that visit, a tree that he was apparently pruning or trimming fell on his head and he required staples to close the wound.  This was on his left skull.  In addition, over the past 3 weeks the  patient has noted some irritation, redness, and blurring of vision in his left eye.  He told me he tried to get an appointment with his ophthalmologist in [facility], but was given an appointment for January.  I urged him to call this physician back and try to get an earlier appointment.  I told him that I would be happy to intervene to try to get him an earlier appointment, if he would like.  It will be recalled that the patient did have herpes zoster outbreak involving the 1st branch of the left cranial 5th nerve in mid September of this year, treated with Valtrex and some antiviral ointment for the left eye.  Apparently, the problems to the left eye have just started within the past few weeks.  The patient denies any other changes in his condition.  No new chest or respiratory problems.  He does have dyspnea on exertion.  PHYSICAL EXAMINATION:  General:  The patient looks well.  He recently celebrated his 77th birthday just last week.  Weight is 184.6 pounds, height 5 feet 10 inches, body surface area 2.03 sq m.  Vital Signs: Blood pressure 122/75.  Other vital signs are normal.  O2 saturation on room air at rest was 97%.  HEENT:  There is conjunctival injection involving the left eye.  The patient says he has blurry vision, as though there is a veil over the left eye.  He is able to count fingers. I did not take note of any pupillary irregularity.  Extraocular movements appear to be intact.  There is no residual evidence of his shingles.  Mouth and pharynx are benign.  Lymph nodes:  There is no peripheral adenopathy palpable in the neck or supraclavicular areas. Lungs:  Clear, but there are decreased breath sounds and dullness over the left lower lung field.  Cardiac:  Regular rhythm without murmur or rub.  Port:  There is a right-sided Port-A-Cath that was flushed with heparin today.  We are maintaining this with heparin flushes every 2 months.  Abdomen:  In the upper abdomen the  patient has a hernia, relatively small, easily noticeable with cough.  No organomegaly or masses.  No suspicious findings.  Extremities:  No peripheral edema or clubbing.  Neurologic:  Normal.  LABORATORY DATA:  Today, white count 5.6, ANC 3.9, hemoglobin 15.2, hematocrit 45.8, platelets 201,000.  Chemistries today were normal.  The most recent CEA was 1.3 on 11/11/2011.  CEA from today is pending.  IMAGING STUDIES:  1. MRI of the head with and without IV contrast from 09/30/2010 was  negative for metastatic disease to the brain. There were no acute  abnormalities.  2. CT scan of the chest with IV contrast from 11/26/2010 showed stable  postsurgical changes in the left hemithorax. There was a stable  cluster of central pulmonary nodules within the right lower lobe.  There is volume  loss in the left upper lobe. There is  paramediastinal atelectasis similar to the prior CT scan of  05/03/2012and a prior PET scan from 01/04/2010.  3. Chest x-ray 2-view from 03/14/2011 showed no acute findings. There  were stable findings compared with prior chest x-ray from  09/23/2010.  4. CT scan of the chest with IV contrast on 07/21/2011 showed  increasing conspicuity of the right lower lobe nodule that was  worrisome for a primary bronchogenic carcinoma. The lesion  measured 10 x 10 mm. It was spiculated. On a prior study from  11/26/2010, the lesion measured 10 x 6 mm. There was radiation  fibrosis and volume loss as well as a small loculated left pleural  effusion. Hepatic steatosis was also noted.  5. PET scan from 08/05/2011 showed that the right lower lobe nodule  was hypermetabolic with an SUV max of 3.3. Biopsy or resection was  recommended. There was no evidence of mediastinal metastatic  disease or any other areas of hypermetabolic activity.  6. MRI of the head with and without IV contrast from 09/12/2011 showed  mild atrophy and moderate chronic microvascular ischemic change but  no  evidence for metastatic disease.  7. Chest CT scan with IV contrast on 11/13/2011 showed  resolution of the central right lower lobe nodule since the previous  study of 07/21/2011. There was new airspace disease in the right lower  lobe and anterior left lung. Differential diagnosis include radiation  pneumonitis, drug reaction and atypical infection. There was a small  stable pericardial effusion versus pericardial thickening, and there was  stable hepatic steatosis.   IMPRESSION AND PLAN:  I am most worried about Mr. Garver's left eye. I have urged him to contact his ophthalmologist.  From the standpoint of the patient's lung cancers, he seems to be stable.  It will be recalled that he will be having a CT scan of the chest ordered by Dr. Michell Heinrich in mid February.  The patient's Port-A- Cath was flushed today.  We will plan to see Mr. Macken again in 2 months, approximately February 20th, at which time we will check CBC and chemistries.  He can be seen by Norina Buzzard at that time.    ______________________________ Samul Dada, M.D. DSM/MEDQ  D:  03/26/2012  T:  03/27/2012  Job:  [id number]

## 2012-04-01 ENCOUNTER — Other Ambulatory Visit: Payer: Self-pay | Admitting: *Deleted

## 2012-04-01 DIAGNOSIS — C383 Malignant neoplasm of mediastinum, part unspecified: Secondary | ICD-10-CM

## 2012-04-01 MED ORDER — TEMAZEPAM 15 MG PO CAPS: ORAL_CAPSULE | ORAL | Status: DC

## 2012-04-12 DIAGNOSIS — B0230 Zoster ocular disease, unspecified: Secondary | ICD-10-CM | POA: Diagnosis not present

## 2012-04-12 DIAGNOSIS — H20019 Primary iridocyclitis, unspecified eye: Secondary | ICD-10-CM | POA: Diagnosis not present

## 2012-04-13 DIAGNOSIS — H20019 Primary iridocyclitis, unspecified eye: Secondary | ICD-10-CM | POA: Diagnosis not present

## 2012-04-13 DIAGNOSIS — B0230 Zoster ocular disease, unspecified: Secondary | ICD-10-CM | POA: Diagnosis not present

## 2012-04-14 DIAGNOSIS — H179 Unspecified corneal scar and opacity: Secondary | ICD-10-CM | POA: Diagnosis not present

## 2012-04-14 DIAGNOSIS — H04129 Dry eye syndrome of unspecified lacrimal gland: Secondary | ICD-10-CM | POA: Diagnosis not present

## 2012-04-14 DIAGNOSIS — H43319 Vitreous membranes and strands, unspecified eye: Secondary | ICD-10-CM | POA: Diagnosis not present

## 2012-04-14 DIAGNOSIS — H259 Unspecified age-related cataract: Secondary | ICD-10-CM | POA: Diagnosis not present

## 2012-04-15 DIAGNOSIS — H2510 Age-related nuclear cataract, unspecified eye: Secondary | ICD-10-CM | POA: Diagnosis not present

## 2012-04-15 DIAGNOSIS — B0232 Zoster iridocyclitis: Secondary | ICD-10-CM | POA: Diagnosis not present

## 2012-04-21 ENCOUNTER — Other Ambulatory Visit: Payer: Self-pay | Admitting: Medical Oncology

## 2012-04-21 DIAGNOSIS — C3410 Malignant neoplasm of upper lobe, unspecified bronchus or lung: Secondary | ICD-10-CM

## 2012-04-21 MED ORDER — PANTOPRAZOLE SODIUM 40 MG PO TBEC: 40.0000 mg | DELAYED_RELEASE_TABLET | Freq: Two times a day (BID) | ORAL | Status: DC

## 2012-04-27 DIAGNOSIS — J019 Acute sinusitis, unspecified: Secondary | ICD-10-CM | POA: Diagnosis not present

## 2012-05-03 DIAGNOSIS — B0239 Other herpes zoster eye disease: Secondary | ICD-10-CM | POA: Diagnosis not present

## 2012-05-03 DIAGNOSIS — H16219 Exposure keratoconjunctivitis, unspecified eye: Secondary | ICD-10-CM | POA: Diagnosis not present

## 2012-05-03 DIAGNOSIS — H209 Unspecified iridocyclitis: Secondary | ICD-10-CM | POA: Diagnosis not present

## 2012-05-11 ENCOUNTER — Telehealth: Payer: Self-pay | Admitting: *Deleted

## 2012-05-11 NOTE — Telephone Encounter (Signed)
Called patient to inform that Dr. Michell Heinrich said that it would o.k. To cancel scan and fu visit and lab with her in Feb., due to him having shingles, he will call when he is ready for these things to be rescheduled, spoke with this patient and he is aware of this .

## 2012-05-21 ENCOUNTER — Telehealth: Payer: Self-pay | Admitting: Oncology

## 2012-05-21 NOTE — Telephone Encounter (Signed)
S/w pt re new appt for 3/3. Moved from 2/20.

## 2012-05-24 ENCOUNTER — Ambulatory Visit: Payer: Medicare Other

## 2012-05-25 ENCOUNTER — Ambulatory Visit (HOSPITAL_COMMUNITY): Payer: Medicare Other

## 2012-05-27 ENCOUNTER — Other Ambulatory Visit: Payer: Medicare Other | Admitting: Lab

## 2012-05-27 ENCOUNTER — Ambulatory Visit: Payer: Medicare Other | Admitting: Radiation Oncology

## 2012-05-27 ENCOUNTER — Ambulatory Visit: Payer: Medicare Other | Admitting: Family

## 2012-05-28 ENCOUNTER — Ambulatory Visit: Payer: Medicare Other | Admitting: Family

## 2012-05-28 ENCOUNTER — Other Ambulatory Visit: Payer: Medicare Other | Admitting: Lab

## 2012-06-07 ENCOUNTER — Ambulatory Visit (HOSPITAL_BASED_OUTPATIENT_CLINIC_OR_DEPARTMENT_OTHER): Payer: Medicare Other | Admitting: Oncology

## 2012-06-07 ENCOUNTER — Telehealth: Payer: Self-pay | Admitting: Oncology

## 2012-06-07 ENCOUNTER — Other Ambulatory Visit (HOSPITAL_BASED_OUTPATIENT_CLINIC_OR_DEPARTMENT_OTHER): Payer: Medicare Other

## 2012-06-07 ENCOUNTER — Encounter: Payer: Self-pay | Admitting: Oncology

## 2012-06-07 ENCOUNTER — Ambulatory Visit: Payer: Medicare Other

## 2012-06-07 VITALS — BP 137/70 | HR 50 | Temp 97.6°F | Resp 20

## 2012-06-07 DIAGNOSIS — C3410 Malignant neoplasm of upper lobe, unspecified bronchus or lung: Secondary | ICD-10-CM

## 2012-06-07 DIAGNOSIS — R911 Solitary pulmonary nodule: Secondary | ICD-10-CM

## 2012-06-07 DIAGNOSIS — Z1211 Encounter for screening for malignant neoplasm of colon: Secondary | ICD-10-CM

## 2012-06-07 LAB — COMPREHENSIVE METABOLIC PANEL (CC13)
Albumin: 3.6 g/dL (ref 3.5–5.0)
BUN: 15.3 mg/dL (ref 7.0–26.0)
CO2: 26 mEq/L (ref 22–29)
Calcium: 9 mg/dL (ref 8.4–10.4)
Chloride: 107 mEq/L (ref 98–107)
Creatinine: 0.9 mg/dL (ref 0.7–1.3)
Potassium: 4.1 mEq/L (ref 3.5–5.1)

## 2012-06-07 LAB — CBC WITH DIFFERENTIAL/PLATELET
Basophils Absolute: 0.1 10*3/uL (ref 0.0–0.1)
Eosinophils Absolute: 0.2 10*3/uL (ref 0.0–0.5)
HCT: 42.7 % (ref 38.4–49.9)
HGB: 14.1 g/dL (ref 13.0–17.1)
LYMPH%: 14.8 % (ref 14.0–49.0)
MCV: 91.1 fL (ref 79.3–98.0)
MONO#: 0.6 10*3/uL (ref 0.1–0.9)
MONO%: 10.1 % (ref 0.0–14.0)
NEUT#: 4.5 10*3/uL (ref 1.5–6.5)
Platelets: 200 10*3/uL (ref 140–400)
RBC: 4.69 10*6/uL (ref 4.20–5.82)
WBC: 6.3 10*3/uL (ref 4.0–10.3)

## 2012-06-07 MED ORDER — SODIUM CHLORIDE 0.9 % IJ SOLN
10.0000 mL | INTRAMUSCULAR | Status: DC | PRN
  Administered 2012-06-07: 10 mL via INTRAVENOUS
  Filled 2012-06-07: qty 10

## 2012-06-07 MED ORDER — HEPARIN SOD (PORK) LOCK FLUSH 100 UNIT/ML IV SOLN
500.0000 [IU] | Freq: Once | INTRAVENOUS | Status: AC
  Administered 2012-06-07: 500 [IU] via INTRAVENOUS
  Filled 2012-06-07: qty 5

## 2012-06-07 NOTE — Telephone Encounter (Signed)
gv and printed appt schedule for May...pt ok

## 2012-06-07 NOTE — Progress Notes (Signed)
This office note has been dictated.  #[id number]

## 2012-06-07 NOTE — Progress Notes (Signed)
CC:   [name], M.D. [name]. [name], M.D. [name], M.D. [name], M.D. [name], MD,FCCP [name], MD  PROBLEM LIST:  1. Squamous cell carcinoma of the left lung, status post FNA on  02/01/2009, stage T4 N2 M0, stage IIIB, involving left upper lobe  and hilar region. The patient was treated with weekly carboplatin  and Taxol in combination with radiation treatments, 6,600 cGy in 33  fractions, directed at the left upper lobe and left hilar region  from 02/22/2009 through 04/12/2009. He then received consolidative  chemotherapy with cisplatin and gemcitabine for 4 cycles from  06/04/2009 through 09/06/2009. He received Neulasta during those  treatments. The patient is no longer on any treatment. His last  PET scan was on 08/05/2011. His last CT scan of the chest was on  11/13/2011. His last 2D echocardiogram was on 11/12/2010.   2. Radiation pneumonitis and pericarditis, status post pericardial  window on 10/25/2009.   3. Hypertension.  4. Dyslipidemia.  5. Depression.  6. Melanoma involving the left face, T3a, status post wide excision on  02/12/2009. Melanoma in situ involving the chest in February 2007.  7. Bilateral hearing loss.  8. Obstructive uropathy.    9. Squamous cell carcinoma in a right lower lobe nodule which was  detected by scheduled surveillance CT scan of the chest carried out  on 07/21/2011, confirmed by PET scan on 08/05/2011 and ultimately  by transbronchial Wang needle biopsy from the right lower lobe on  08/27/2011. This lesion is most likely a new primary lung cancer,  Stage T1N0. The patient underwent stereotactic radiation beginning on 09/29/2011 through 10/08/2011. The patient received a total of 5 fractions, 10 Gray per fraction equaling 50 Gray to the right lower lobe lesion. He tolerated  these treatments well.    10. Herpes zoster outbreak involving the 1st branch of the left cranial  nerve V in mid  September and treated with Valtrex and ointment  for the left eye.  11. Right Port-A-Cath placed on 07/11/2009.   MEDICATIONS:  Reviewed and recorded. Current Outpatient Prescriptions  Medication Sig Dispense Refill  . atorvastatin (LIPITOR) 20 MG tablet       . brimonidine (ALPHAGAN P) 0.1 % SOLN 2 (two) times daily.      Marland Kitchen buPROPion (WELLBUTRIN SR) 150 MG 12 hr tablet Take 150 mg by mouth daily.       . clopidogrel (PLAVIX) 75 MG tablet Take 1 tablet (75 mg total) by mouth daily. TO BE RESTARTED ON FRIDAY 08/29/11      . desvenlafaxine (PRISTIQ) 50 MG 24 hr tablet Take 50 mg by mouth daily.      . Difluprednate (DUREZOL OP) Apply to eye 2 (two) times daily.      . metoprolol tartrate (LOPRESSOR) 25 MG tablet Take 12.5 mg by mouth 2 (two) times daily.      . pantoprazole (PROTONIX) 40 MG tablet Take 1 tablet (40 mg total) by mouth 2 (two) times daily.  60 tablet  3  . Tamsulosin HCl (FLOMAX) 0.4 MG CAPS Take 0.4 mg by mouth daily with supper.       . temazepam (RESTORIL) 15 MG capsule TAKE ONE CAPSULE AT BEDTIME AS NEEDED FOR SLEEP. MAY REPEAT TIMES ONE.  60 capsule  2  . valACYclovir (VALTREX) 500 MG tablet Take 500 mg by mouth 2 (two) times daily.       No current facility-administered medications for this visit.  IMMUNIZATIONS:  Pneumovax was given on 01/05/2010.  Flu shot was given in October 2013.    SMOKING HISTORY: The patient smoked up to 3 packs of cigarettes a day  from age 56 until 10 when he stopped smoking.    HISTORY:  I saw [name] Fields today for followup of his squamous cell carcinoma of the left lung, stage IIIB with diagnosis going back to October 2010.  [name] Fields was last seen by [place] on 03/26/2012.  He continues to do well from the standpoint of his 2 lung cancers, the first being stage IIIB involving the left lung and most recently a right lower lobe nodule that was a squamous cell carcinoma treated with stereotactic radiation.  These treatments  were completed in early July 2013.  When I saw [name] Fields a couple months ago he was having a lot of problems with his left eye due to shingles.  He had lost most of the vision.  He tells me that he now has regained about 70% of his vision and is hoping to regain even more.  He has some dyspnea on exertion and cough but no change.  No pain or hemoptysis.  In general he continues to do quite well.  PHYSICAL EXAM:  He looks well and is in good spirits.  Weight is 190 pounds.  Height 5 feet 10 inches.  Body surface area 2.06 sq m.  Blood pressure 137/70.  Pulse 50 and regular.  Respirations regular and nonlabored.  HEENT:  There is some very minimal conjunctival injection involving the left eye.  Mouth and pharynx are benign.  No adenopathy palpable in the neck or supraclavicular areas.  No evidence for recurrent melanoma involving the left face.  Lungs revealed decreased breath sounds and dullness at the left lower lung field.  Cardiac exam regular rhythm without murmur or rub.  There is a right-sided Port-A- Cath that was flushed with heparin today.  We are maintaining this with heparin flushes every 2 months.  Abdomen is benign with no organomegaly or masses palpable.  Extremities:  No peripheral edema or clubbing. Neurologic exam was normal.  LABORATORY DATA:  Today, white count 6.3, ANC 4.5, hemoglobin 14.1, hematocrit 42.7, platelets 200,000.  Chemistries were normal.  CEA on 03/26/2012 was 0.9.  IMAGING STUDIES:  1. MRI of the head with and without IV contrast from 09/30/2010 was  negative for metastatic disease to the brain. There were no acute  abnormalities.  2. CT scan of the chest with IV contrast from 11/26/2010 showed stable  postsurgical changes in the left hemithorax. There was a stable  cluster of central pulmonary nodules within the right lower lobe.  There is volume loss in the left upper lobe. There is  paramediastinal atelectasis similar to the prior CT scan of   05/03/2012and a prior PET scan from 01/04/2010.  3. Chest x-ray 2-view from 03/14/2011 showed no acute findings. There  were stable findings compared with prior chest x-ray from  09/23/2010.  4. CT scan of the chest with IV contrast on 07/21/2011 showed  increasing conspicuity of the right lower lobe nodule that was  worrisome for a primary bronchogenic carcinoma. The lesion  measured 10 x 10 mm. It was spiculated. On a prior study from  11/26/2010, the lesion measured 10 x 6 mm. There was radiation  fibrosis and volume loss as well as a small loculated left pleural  effusion. Hepatic steatosis was also noted.  5. PET scan from 08/05/2011 showed that the  right lower lobe nodule  was hypermetabolic with an SUV max of 3.3. Biopsy or resection was  recommended. There was no evidence of mediastinal metastatic  disease or any other areas of hypermetabolic activity.  6. MRI of the head with and without IV contrast from 09/12/2011 showed  mild atrophy and moderate chronic microvascular ischemic change but  no evidence for metastatic disease.  7. Chest CT scan with IV contrast on 11/13/2011 showed  resolution of the central right lower lobe nodule since the previous  study of 07/21/2011. There was new airspace disease in the right lower  lobe and anterior left lung. Differential diagnosis include radiation  pneumonitis, drug reaction and atypical infection. There was a small  stable pericardial effusion versus pericardial thickening, and there was  stable hepatic steatosis.  8. CTscan of the chest with IV contrast is scheduled for the next week.   IMPRESSION AND PLAN:  [name] Fields continues to do well, now approaching 3-1/2 years from the time of the original diagnosis of a stage IIIB squamous cell cancer involving the left lung.  He is now approaching 1 year from the time of diagnosis of what we believe to be a second primary in the right lower lobe, also a squamous cell carcinoma  treated with stereotactic radiation.  [name] Fields will have a CT scan of the chest with IV contrast either this week or next.  He was scheduled to have this scan in mid February however he was not able to have the scan because of the problems with his left eye.  As stated we are maintaining his Port-A-Cath with heparin flushes every 2 months.  We will plan to see [name] Fields again in 2 months, at which time we will check CBC and chemistries.  He is not on any treatment at this time.    ______________________________ Samul Dada, M.D. DSM/MEDQ  D:  06/07/2012  T:  06/07/2012  Job:  [id number]

## 2012-06-11 ENCOUNTER — Telehealth: Payer: Self-pay | Admitting: *Deleted

## 2012-06-11 ENCOUNTER — Other Ambulatory Visit: Payer: Self-pay | Admitting: Radiation Oncology

## 2012-06-11 NOTE — Telephone Encounter (Signed)
CALLED PATIENT TO INFORM OF TEST AND FU VISIT, SPOKE WITH PATIENT AND HE IS AWARE OF THESE APPTS 

## 2012-07-13 DIAGNOSIS — C3430 Malignant neoplasm of lower lobe, unspecified bronchus or lung: Secondary | ICD-10-CM | POA: Diagnosis not present

## 2012-07-13 DIAGNOSIS — I2510 Atherosclerotic heart disease of native coronary artery without angina pectoris: Secondary | ICD-10-CM | POA: Diagnosis not present

## 2012-07-13 DIAGNOSIS — J449 Chronic obstructive pulmonary disease, unspecified: Secondary | ICD-10-CM | POA: Diagnosis not present

## 2012-07-13 DIAGNOSIS — F329 Major depressive disorder, single episode, unspecified: Secondary | ICD-10-CM | POA: Diagnosis not present

## 2012-07-13 DIAGNOSIS — I10 Essential (primary) hypertension: Secondary | ICD-10-CM | POA: Diagnosis not present

## 2012-07-14 ENCOUNTER — Other Ambulatory Visit: Payer: Self-pay | Admitting: *Deleted

## 2012-07-14 DIAGNOSIS — C383 Malignant neoplasm of mediastinum, part unspecified: Secondary | ICD-10-CM

## 2012-07-14 MED ORDER — TEMAZEPAM 15 MG PO CAPS: ORAL_CAPSULE | ORAL | Status: DC

## 2012-07-19 DIAGNOSIS — R0609 Other forms of dyspnea: Secondary | ICD-10-CM | POA: Diagnosis not present

## 2012-07-19 DIAGNOSIS — E782 Mixed hyperlipidemia: Secondary | ICD-10-CM | POA: Diagnosis not present

## 2012-07-19 DIAGNOSIS — I2510 Atherosclerotic heart disease of native coronary artery without angina pectoris: Secondary | ICD-10-CM | POA: Diagnosis not present

## 2012-07-19 DIAGNOSIS — I10 Essential (primary) hypertension: Secondary | ICD-10-CM | POA: Diagnosis not present

## 2012-07-19 DIAGNOSIS — J449 Chronic obstructive pulmonary disease, unspecified: Secondary | ICD-10-CM | POA: Diagnosis not present

## 2012-07-27 DIAGNOSIS — R0989 Other specified symptoms and signs involving the circulatory and respiratory systems: Secondary | ICD-10-CM | POA: Diagnosis not present

## 2012-07-27 DIAGNOSIS — I2510 Atherosclerotic heart disease of native coronary artery without angina pectoris: Secondary | ICD-10-CM | POA: Diagnosis not present

## 2012-07-27 DIAGNOSIS — I10 Essential (primary) hypertension: Secondary | ICD-10-CM | POA: Diagnosis not present

## 2012-07-27 DIAGNOSIS — E782 Mixed hyperlipidemia: Secondary | ICD-10-CM | POA: Diagnosis not present

## 2012-07-27 DIAGNOSIS — J449 Chronic obstructive pulmonary disease, unspecified: Secondary | ICD-10-CM | POA: Diagnosis not present

## 2012-07-27 DIAGNOSIS — R0609 Other forms of dyspnea: Secondary | ICD-10-CM | POA: Diagnosis not present

## 2012-07-29 ENCOUNTER — Telehealth: Payer: Self-pay | Admitting: Oncology

## 2012-07-29 NOTE — Telephone Encounter (Signed)
Pt called today to r/s 5/2 appt and was given new appt for 5/23.

## 2012-08-06 ENCOUNTER — Ambulatory Visit: Payer: Medicare Other | Admitting: Oncology

## 2012-08-06 ENCOUNTER — Other Ambulatory Visit: Payer: Medicare Other | Admitting: Lab

## 2012-08-24 DIAGNOSIS — H20019 Primary iridocyclitis, unspecified eye: Secondary | ICD-10-CM | POA: Diagnosis not present

## 2012-08-24 DIAGNOSIS — H1820 Unspecified corneal edema: Secondary | ICD-10-CM | POA: Diagnosis not present

## 2012-08-24 DIAGNOSIS — B0230 Zoster ocular disease, unspecified: Secondary | ICD-10-CM | POA: Diagnosis not present

## 2012-08-24 DIAGNOSIS — H16229 Keratoconjunctivitis sicca, not specified as Sjogren's, unspecified eye: Secondary | ICD-10-CM | POA: Diagnosis not present

## 2012-08-25 DIAGNOSIS — H179 Unspecified corneal scar and opacity: Secondary | ICD-10-CM | POA: Diagnosis not present

## 2012-08-25 DIAGNOSIS — H04129 Dry eye syndrome of unspecified lacrimal gland: Secondary | ICD-10-CM | POA: Diagnosis not present

## 2012-08-25 DIAGNOSIS — H259 Unspecified age-related cataract: Secondary | ICD-10-CM | POA: Diagnosis not present

## 2012-08-25 DIAGNOSIS — H43319 Vitreous membranes and strands, unspecified eye: Secondary | ICD-10-CM | POA: Diagnosis not present

## 2012-08-27 ENCOUNTER — Ambulatory Visit (HOSPITAL_BASED_OUTPATIENT_CLINIC_OR_DEPARTMENT_OTHER): Payer: Medicare Other | Admitting: Oncology

## 2012-08-27 ENCOUNTER — Other Ambulatory Visit: Payer: Self-pay | Admitting: Medical Oncology

## 2012-08-27 ENCOUNTER — Telehealth: Payer: Self-pay | Admitting: Internal Medicine

## 2012-08-27 ENCOUNTER — Other Ambulatory Visit (HOSPITAL_BASED_OUTPATIENT_CLINIC_OR_DEPARTMENT_OTHER): Payer: Medicare Other | Admitting: Lab

## 2012-08-27 ENCOUNTER — Ambulatory Visit: Payer: Medicare Other

## 2012-08-27 ENCOUNTER — Encounter: Payer: Self-pay | Admitting: Oncology

## 2012-08-27 VITALS — BP 120/72 | HR 58 | Temp 97.0°F

## 2012-08-27 DIAGNOSIS — C801 Malignant (primary) neoplasm, unspecified: Secondary | ICD-10-CM

## 2012-08-27 DIAGNOSIS — C3410 Malignant neoplasm of upper lobe, unspecified bronchus or lung: Secondary | ICD-10-CM

## 2012-08-27 LAB — COMPREHENSIVE METABOLIC PANEL (CC13)
ALT: 20 U/L (ref 0–55)
AST: 19 U/L (ref 5–34)
Alkaline Phosphatase: 143 U/L (ref 40–150)
BUN: 20 mg/dL (ref 7.0–26.0)
Calcium: 8.9 mg/dL (ref 8.4–10.4)
Chloride: 104 mEq/L (ref 98–107)
Creatinine: 0.9 mg/dL (ref 0.7–1.3)
Potassium: 4 mEq/L (ref 3.5–5.1)

## 2012-08-27 LAB — CBC WITH DIFFERENTIAL/PLATELET
BASO%: 0.5 % (ref 0.0–2.0)
Basophils Absolute: 0 10*3/uL (ref 0.0–0.1)
EOS%: 2.3 % (ref 0.0–7.0)
MCH: 30.2 pg (ref 27.2–33.4)
MCHC: 33.2 g/dL (ref 32.0–36.0)
MCV: 91 fL (ref 79.3–98.0)
MONO%: 10.4 % (ref 0.0–14.0)
RDW: 13.1 % (ref 11.0–14.6)
lymph#: 1.1 10*3/uL (ref 0.9–3.3)

## 2012-08-27 MED ORDER — SODIUM CHLORIDE 0.9 % IJ SOLN
10.0000 mL | INTRAMUSCULAR | Status: DC | PRN
  Administered 2012-08-27: 10 mL via INTRAVENOUS
  Filled 2012-08-27: qty 10

## 2012-08-27 MED ORDER — HEPARIN SOD (PORK) LOCK FLUSH 100 UNIT/ML IV SOLN
500.0000 [IU] | Freq: Once | INTRAVENOUS | Status: AC
  Administered 2012-08-27: 500 [IU] via INTRAVENOUS
  Filled 2012-08-27: qty 5

## 2012-08-27 NOTE — Progress Notes (Signed)
This office note has been dictated.  #[id number]

## 2012-08-28 NOTE — Progress Notes (Signed)
CC:   [name], M.D. [name]. [name], M.D. [name], M.D. [name], M.D. [name], MD,FCCP [name], MD  PROBLEM LIST:  1. Squamous cell carcinoma of the left lung, status post FNA on  02/01/2009, stage T4 N2 M0, stage IIIB, involving left upper lobe  and hilar region. The patient was treated with weekly carboplatin  and Taxol in combination with radiation treatments, 6,600 cGy in 33  fractions, directed at the left upper lobe and left hilar region  from 02/22/2009 through 04/12/2009. He then received consolidative  chemotherapy with cisplatin and gemcitabine for 4 cycles from  06/04/2009 through 09/06/2009. He received Neulasta during those  treatments. The patient is no longer on any treatment. His last  PET scan was on 08/05/2011. His last CT scan of the chest was on  11/13/2011. His last 2D echocardiogram was on 11/12/2010.   2. Radiation pneumonitis and pericarditis, status post pericardial  window on 10/25/2009.   3. Hypertension.  4. Dyslipidemia.  5. Depression.  6. Melanoma involving the left face, T3a, status post wide excision on  02/12/2009. Melanoma in situ involving the chest in February 2007.  7. Bilateral hearing loss.  8. Obstructive uropathy.   9. Squamous cell carcinoma in a right lower lobe nodule which was  detected by scheduled surveillance CT scan of the chest carried out  on 07/21/2011, confirmed by PET scan on 08/05/2011 and ultimately  by transbronchial Wang needle biopsy from the right lower lobe on  08/27/2011. This lesion is most likely a new primary lung cancer,  Stage T1N0. The patient underwent stereotactic radiation beginning on 09/29/2011 through 10/08/2011. The patient received a total of 5 fractions, 10 Gray per fraction equaling 50 Gray to the right lower lobe lesion. He tolerated  these treatments well.   10. Herpes zoster involving the first branch of the left cranial nerve V and left eye  resulting in decreased vision with onset in mid September 2013.   11. Right Port-A-Cath placed on 07/11/2009.   MEDICATIONS:  Reviewed and recorded. Current Outpatient Prescriptions  Medication Sig Dispense Refill  . atorvastatin (LIPITOR) 20 MG tablet Take 20 mg by mouth daily.       . brimonidine (ALPHAGAN P) 0.1 % SOLN 2 (two) times daily.      Marland Kitchen buPROPion (WELLBUTRIN SR) 150 MG 12 hr tablet Take 150 mg by mouth daily.       . clopidogrel (PLAVIX) 75 MG tablet Take 1 tablet (75 mg total) by mouth daily. TO BE RESTARTED ON FRIDAY 08/29/11      . Difluprednate (DUREZOL OP) Apply to eye 2 (two) times daily.      Marland Kitchen erythromycin ophthalmic ointment Place into the left eye 3 (three) times daily.      . metoprolol tartrate (LOPRESSOR) 25 MG tablet Take 12.5 mg by mouth 2 (two) times daily.      . pantoprazole (PROTONIX) 40 MG tablet Take 1 tablet (40 mg total) by mouth 2 (two) times daily.  60 tablet  3  . Tamsulosin HCl (FLOMAX) 0.4 MG CAPS Take 0.4 mg by mouth daily with supper.       . temazepam (RESTORIL) 15 MG capsule TAKE ONE CAPSULE AT BEDTIME AS NEEDED FOR SLEEP. MAY REPEAT TIMES ONE.  60 capsule  1  . valACYclovir (VALTREX) 500 MG tablet Take 500 mg by mouth 2 (two) times daily.      Marland Kitchen venlafaxine XR (EFFEXOR-XR) 150 MG 24 hr capsule Take  by mouth daily.       Marland Kitchen ZIRGAN 0.15 % GEL 5 times a day       No current facility-administered medications for this visit.    IMMUNIZATIONS:  Pneumovax was given on 01/05/2010.  Flu shot was given in October 2013.    SMOKING HISTORY: The patient smoked up to 3 packs of cigarettes a day  from age 68 until 25 when he stopped smoking.    HISTORY:  [name] Fields was seen today for followup of his squamous cell carcinoma of the left lung, stage IIIB with diagnosis going back to October 2010.  [name] Fields also underwent stereotactic radiation for a T1 N0 squamous cell carcinoma involving the right lower lobe in late June/early July of 2013.   He was found to have a nodule on routine followup CT scan of the chest.  The patient seems to be doing well from both of these malignancies without any obvious recurrence or change in his clinical status.  He continues to have dyspnea on exertion, some cough which is nonproductive but no chest pain or progression of these symptoms.  The patient's main problem is that apparently he had a recurrence of the shingles involving the left eye.  He is on some new medicines.  He said that the vision was clouded and blurry.  He thinks that he retains about 70% of his vision.  He has fair energy.  Appetite is good although he has tried to lose some weight.  He remains active around his home.  PHYSICAL EXAMINATION:  The patient looks well without any obvious changes although he has lost about 5 pounds.  Weight today is 184 pounds 14.4 ounces.  Height 5 feet 10 inches.  Body surface area 2.04 sq m. Blood pressure 123/74.  Other vital signs are normal.  O2 saturation on room air at rest was 96%.  There is no scleral icterus.  The pupil of the left eye is round but does not seem to react and is larger than the pupil of the right eye which is slightly reactive to light.  There may be some slight conjunctival injection on the left.  Mouth and pharynx are benign.  There is no peripheral adenopathy palpable.  No evidence for recurrent melanoma involving the left face.  Lungs reveal harsh breath sounds at the right base on both inspiration and expiration.  The most striking change which has been stable is dullness and decreased breath sounds over the left lower lung field.  Cardiac exam regular rhythm without murmur or rub.  He has a right-sided Port-A-Cath that was flushed with heparin today and every 2 months.  Abdomen is benign with no organomegaly or masses palpable.  Extremities:  No peripheral edema or clubbing.  Neurologic exam is normal.  LABORATORY DATA:  Today, CBC and chemistries are entirely  normal.  CEA from 03/26/2012 was 0.9.  IMAGING STUDIES:  1. MRI of the head with and without IV contrast from 09/30/2010 was  negative for metastatic disease to the brain. There were no acute  abnormalities.  2. CT scan of the chest with IV contrast from 11/26/2010 showed stable  postsurgical changes in the left hemithorax. There was a stable  cluster of central pulmonary nodules within the right lower lobe.  There is volume loss in the left upper lobe. There is  paramediastinal atelectasis similar to the prior CT scan of  05/03/2012and a prior PET scan from 01/04/2010.  3. Chest x-ray 2-view from 03/14/2011 showed  no acute findings. There  were stable findings compared with prior chest x-ray from  09/23/2010.  4. CT scan of the chest with IV contrast on 07/21/2011 showed  increasing conspicuity of the right lower lobe nodule that was  worrisome for a primary bronchogenic carcinoma. The lesion  measured 10 x 10 mm. It was spiculated. On a prior study from  11/26/2010, the lesion measured 10 x 6 mm. There was radiation  fibrosis and volume loss as well as a small loculated left pleural  effusion. Hepatic steatosis was also noted.  5. PET scan from 08/05/2011 showed that the right lower lobe nodule  was hypermetabolic with an SUV max of 3.3. Biopsy or resection was  recommended. There was no evidence of mediastinal metastatic  disease or any other areas of hypermetabolic activity.  6. MRI of the head with and without IV contrast from 09/12/2011 showed  mild atrophy and moderate chronic microvascular ischemic change but  no evidence for metastatic disease.  7. Chest CT scan with IV contrast on 11/13/2011 showed  resolution of the central right lower lobe nodule since the previous  study of 07/21/2011. There was new airspace disease in the right lower  lobe and anterior left lung. Differential diagnosis include radiation  pneumonitis, drug reaction and atypical infection. There was a  small  stable pericardial effusion versus pericardial thickening, and there was  stable hepatic steatosis. 8. Chest CT scan with IV contrast has been scheduled for 09/27/2012.   IMPRESSION AND PLAN:  Mr. Orman continues to do well, now about 3-1/2 years from the time of his original diagnosis of a stage IIIB squamous cell carcinoma involving the left lung and now over 1 year from the time of diagnosis of what we believe to be a second primary in the right lower lobe, also a squamous cell carcinoma treated with stereotactic radiation.  For reasons that are unclear [name] Fields did not have a CT scan of the chest which I had ordered for March.  As stated, his last CT scan of the chest was carried out on 11/13/2011.  I see where he has been scheduled for CT scan of the chest with IV contrast by Dr. Michell Heinrich for June 23 and to see Dr. Michell Heinrich a few days later.  We will leave that CT scan as ordered.  [name] Fields will continue to come in every 2 months for a Port-A-Cath heparin flush.  We will plan to see him again in 4 months, at which time  we will check CBC, chemistries and CEA.    ______________________________ Samul Dada, M.D. DSM/MEDQ  D:  08/27/2012  T:  08/28/2012  Job:  [id number]

## 2012-09-06 DIAGNOSIS — H04129 Dry eye syndrome of unspecified lacrimal gland: Secondary | ICD-10-CM | POA: Diagnosis not present

## 2012-09-07 ENCOUNTER — Other Ambulatory Visit: Payer: Self-pay | Admitting: Oncology

## 2012-09-07 NOTE — Telephone Encounter (Signed)
Called pt to let him know Dr Arline Asp approved refill and protonix was e-scribed to [facility] (formerly [facility]) in [place]

## 2012-09-27 ENCOUNTER — Other Ambulatory Visit: Payer: Self-pay | Admitting: Oncology

## 2012-09-27 ENCOUNTER — Encounter: Payer: Self-pay | Admitting: Radiation Oncology

## 2012-09-27 ENCOUNTER — Ambulatory Visit (HOSPITAL_COMMUNITY)
Admission: RE | Admit: 2012-09-27 | Discharge: 2012-09-27 | Disposition: A | Discharge status: Home / Self Care | Payer: Medicare Other | Source: Ambulatory Visit | Attending: Radiation Oncology | Admitting: Radiation Oncology

## 2012-09-27 ENCOUNTER — Encounter (HOSPITAL_COMMUNITY): Payer: Self-pay

## 2012-09-27 DIAGNOSIS — J90 Pleural effusion, not elsewhere classified: Secondary | ICD-10-CM | POA: Diagnosis not present

## 2012-09-27 DIAGNOSIS — C3490 Malignant neoplasm of unspecified part of unspecified bronchus or lung: Secondary | ICD-10-CM | POA: Insufficient documentation

## 2012-09-27 DIAGNOSIS — Z923 Personal history of irradiation: Secondary | ICD-10-CM | POA: Insufficient documentation

## 2012-09-27 DIAGNOSIS — R05 Cough: Secondary | ICD-10-CM | POA: Insufficient documentation

## 2012-09-27 DIAGNOSIS — R059 Cough, unspecified: Secondary | ICD-10-CM | POA: Insufficient documentation

## 2012-09-27 DIAGNOSIS — R0602 Shortness of breath: Secondary | ICD-10-CM | POA: Diagnosis not present

## 2012-09-27 MED ORDER — IOHEXOL 300 MG/ML  SOLN
80.0000 mL | Freq: Once | INTRAMUSCULAR | Status: AC | PRN
  Administered 2012-09-27: 80 mL via INTRAVENOUS

## 2012-09-30 ENCOUNTER — Telehealth: Payer: Self-pay | Admitting: *Deleted

## 2012-09-30 ENCOUNTER — Encounter: Payer: Self-pay | Admitting: Radiation Oncology

## 2012-09-30 ENCOUNTER — Ambulatory Visit
Admission: RE | Admit: 2012-09-30 | Discharge: 2012-09-30 | Disposition: A | Discharge status: Home / Self Care | Payer: Medicare Other | Source: Ambulatory Visit | Attending: Radiation Oncology | Admitting: Radiation Oncology

## 2012-09-30 DIAGNOSIS — C3490 Malignant neoplasm of unspecified part of unspecified bronchus or lung: Secondary | ICD-10-CM | POA: Diagnosis not present

## 2012-09-30 HISTORY — DX: Personal history of irradiation: Z92.3

## 2012-09-30 NOTE — Telephone Encounter (Signed)
Called patient to inform of test and fu visit for 12-2012, lvm for a return call

## 2012-09-30 NOTE — Progress Notes (Signed)
Department of Radiation Oncology  Phone:  [phone] Fax:        [phone]   Name: [name] Fields MRN: [id number]  DOB: [birth year]  Date: 09/30/2012  Follow Up Visit Note  Diagnosis: Stage III non-small cell lung cancer treated with concurrent chemoradiation completed 2010 followed by adjuvant chemotherapy. Most recently a T1 N0 right lower lobe non-small cell lung cancer treated with SB RT 50 gray in 5 fractions completed 10/08/2011.  Interval since last radiation: One year  Interval History: [name] Fields presents today for routine followup.  He is feeling well and doing well. He is maintaining his weight. He is following up with his primary care physician as well as cardiology. He has stable dyspnea on exertion and a stable chronic cough. He denies any headaches bone pain or hemoptysis. He had a CT of the chest on 09/27/2012 which revealed a focal opacity in the right lower lobe infrahilar region possibly radiation change versus disease progression. Progression was also noted in the left hilum which again posttreatment change versus recurrent disease. I reviewed his scans in our multidisciplinary tumor Board this morning. A PET scan or short interval followup CT scan was recommended.  Allergies:  Allergies  Allergen Reactions  . Sertraline Hcl Other (See Comments)    unknown    Medications:  Current Outpatient Prescriptions  Medication Sig Dispense Refill  . atorvastatin (LIPITOR) 20 MG tablet Take 20 mg by mouth daily.       . brimonidine (ALPHAGAN P) 0.1 % SOLN 2 (two) times daily.      Marland Kitchen buPROPion (WELLBUTRIN SR) 150 MG 12 hr tablet Take 150 mg by mouth daily.       . clopidogrel (PLAVIX) 75 MG tablet Take 1 tablet (75 mg total) by mouth daily. TO BE RESTARTED ON FRIDAY 08/29/11      . Difluprednate (DUREZOL OP) Apply to eye 2 (two) times daily.      Marland Kitchen erythromycin ophthalmic ointment Place into the left eye 3 (three) times daily.      . metoprolol tartrate (LOPRESSOR) 25  MG tablet Take 12.5 mg by mouth 2 (two) times daily.      . pantoprazole (PROTONIX) 40 MG tablet TAKE 1 TABLET BY MOUTH TWICE DAILY  60 tablet  3  . Tamsulosin HCl (FLOMAX) 0.4 MG CAPS Take 0.4 mg by mouth daily with supper.       . temazepam (RESTORIL) 15 MG capsule TAKE 1 CAPSULE BY MOUTH AT BEDTIME AS NEEDED FOR SLEEP. MAY REPEAT ONCE.  60 capsule  2  . valACYclovir (VALTREX) 500 MG tablet Take 500 mg by mouth 2 (two) times daily.      Marland Kitchen venlafaxine XR (EFFEXOR-XR) 150 MG 24 hr capsule Take by mouth daily.       Marland Kitchen ZIRGAN 0.15 % GEL 5 times a day       No current facility-administered medications for this encounter.    Physical Exam:  Filed Vitals:   09/30/12 1310  BP: 103/64  Pulse: 70  Temp: 97.9 F (36.6 C)   he is a pleasant male in no distress sitting comfortably examining table. His pulse ox  IMPRESSION: [name] Fields is a 78 y.o. male status post radiation with concurrent chemotherapy to the left hilum as well as SBRT to a right lower lobe nodule one year ago  PLAN:  I had a long discussion with Mr. Hutzler today. If he does in fact have tumor recurrence in his lung he is not a candidate for  further radiation or surgery. He would be a candidate for palliative chemotherapy alone. For this reason as well as his asymptomatic nature we've elected to go ahead and repeat a CT scan in 3 months. I am not suspicious that this area in the right lower lobe is recurrent tumor and more likely is radiation scar tissue.    [name], MD

## 2012-10-25 ENCOUNTER — Telehealth: Payer: Self-pay | Admitting: Oncology

## 2012-10-25 ENCOUNTER — Ambulatory Visit (HOSPITAL_BASED_OUTPATIENT_CLINIC_OR_DEPARTMENT_OTHER): Payer: Medicare Other

## 2012-10-25 DIAGNOSIS — C3410 Malignant neoplasm of upper lobe, unspecified bronchus or lung: Secondary | ICD-10-CM | POA: Diagnosis not present

## 2012-10-25 DIAGNOSIS — Z452 Encounter for adjustment and management of vascular access device: Secondary | ICD-10-CM

## 2012-10-25 MED ORDER — HEPARIN SOD (PORK) LOCK FLUSH 100 UNIT/ML IV SOLN
500.0000 [IU] | Freq: Once | INTRAVENOUS | Status: AC
  Administered 2012-10-25: 500 [IU] via INTRAVENOUS
  Filled 2012-10-25: qty 5

## 2012-10-25 MED ORDER — SODIUM CHLORIDE 0.9 % IJ SOLN
10.0000 mL | INTRAMUSCULAR | Status: DC | PRN
  Administered 2012-10-25: 10 mL via INTRAVENOUS
  Filled 2012-10-25: qty 10

## 2012-10-25 NOTE — Telephone Encounter (Signed)
pt needed flush....Done

## 2012-11-09 DIAGNOSIS — H2510 Age-related nuclear cataract, unspecified eye: Secondary | ICD-10-CM | POA: Diagnosis not present

## 2012-11-20 ENCOUNTER — Other Ambulatory Visit: Payer: Self-pay | Admitting: Oncology

## 2012-12-23 ENCOUNTER — Other Ambulatory Visit: Payer: Self-pay | Admitting: Oncology

## 2012-12-28 ENCOUNTER — Ambulatory Visit (HOSPITAL_BASED_OUTPATIENT_CLINIC_OR_DEPARTMENT_OTHER): Payer: Medicare Other

## 2012-12-28 ENCOUNTER — Other Ambulatory Visit: Payer: Medicare Other

## 2012-12-28 ENCOUNTER — Other Ambulatory Visit: Payer: Medicare Other | Admitting: Lab

## 2012-12-28 ENCOUNTER — Encounter: Payer: Self-pay | Admitting: Internal Medicine

## 2012-12-28 ENCOUNTER — Telehealth: Payer: Self-pay | Admitting: Internal Medicine

## 2012-12-28 ENCOUNTER — Ambulatory Visit (HOSPITAL_BASED_OUTPATIENT_CLINIC_OR_DEPARTMENT_OTHER): Payer: Medicare Other | Admitting: Internal Medicine

## 2012-12-28 VITALS — BP 95/65 | HR 63 | Temp 97.2°F | Resp 18 | Ht 70.0 in | Wt 187.3 lb

## 2012-12-28 DIAGNOSIS — C3490 Malignant neoplasm of unspecified part of unspecified bronchus or lung: Secondary | ICD-10-CM | POA: Diagnosis not present

## 2012-12-28 DIAGNOSIS — C3410 Malignant neoplasm of upper lobe, unspecified bronchus or lung: Secondary | ICD-10-CM | POA: Diagnosis not present

## 2012-12-28 DIAGNOSIS — Z85820 Personal history of malignant melanoma of skin: Secondary | ICD-10-CM | POA: Diagnosis not present

## 2012-12-28 DIAGNOSIS — R9389 Abnormal findings on diagnostic imaging of other specified body structures: Secondary | ICD-10-CM | POA: Diagnosis not present

## 2012-12-28 LAB — COMPREHENSIVE METABOLIC PANEL (CC13)
AST: 18 U/L (ref 5–34)
BUN: 16 mg/dL (ref 7.0–26.0)
Calcium: 9.2 mg/dL (ref 8.4–10.4)
Chloride: 106 mEq/L (ref 98–109)
Creatinine: 0.8 mg/dL (ref 0.7–1.3)

## 2012-12-28 LAB — CBC WITH DIFFERENTIAL/PLATELET
Eosinophils Absolute: 0.1 10*3/uL (ref 0.0–0.5)
LYMPH%: 11.7 % — ABNORMAL LOW (ref 14.0–49.0)
MCHC: 33.3 g/dL (ref 32.0–36.0)
MCV: 91.3 fL (ref 79.3–98.0)
MONO%: 11.2 % (ref 0.0–14.0)
NEUT#: 5.4 10*3/uL (ref 1.5–6.5)
Platelets: 232 10*3/uL (ref 140–400)
RBC: 4.56 10*6/uL (ref 4.20–5.82)

## 2012-12-28 LAB — LACTATE DEHYDROGENASE (CC13): LDH: 170 U/L (ref 125–245)

## 2012-12-28 MED ORDER — SODIUM CHLORIDE 0.9 % IJ SOLN
10.0000 mL | INTRAMUSCULAR | Status: DC | PRN
  Administered 2012-12-28: 10 mL via INTRAVENOUS
  Filled 2012-12-28: qty 10

## 2012-12-28 MED ORDER — HEPARIN SOD (PORK) LOCK FLUSH 100 UNIT/ML IV SOLN
500.0000 [IU] | Freq: Once | INTRAVENOUS | Status: AC
  Administered 2012-12-28: 500 [IU] via INTRAVENOUS
  Filled 2012-12-28: qty 5

## 2012-12-28 NOTE — Telephone Encounter (Signed)
gv and printed appt sched and avs for pt for Sept thru Jan 2015

## 2012-12-28 NOTE — Progress Notes (Signed)
[facility] OFFICE PROGRESS NOTE  [name] Dubonnet, MD [address] [place] [address]  DIAGNOSIS: MELANOMA, HX OF - Plan: CBC with Differential, Comprehensive metabolic panel, CEA  Malignant neoplasm of upper lobe, bronchus or lung, left - Plan: CBC with Differential, Comprehensive metabolic panel, CEA  Chief Complaint  Patient presents with  . Lung Cancer    CURRENT THERAPY:  Active surveillance  INTERVAL HISTORY: [name] Fields 78 y.o. male with a history of Melanoma and NSCLC is here for followup.  He was last seen by Dr. Arline Asp on 08/27/2012. He reports he feels the best he has felt in about 3 years.  He has baseline dyspnea with exertion coming up the step.  He has his right port flushed q 8 weeks for the past 4 years.  He will have his next CT scan of chest/abdomen/pelvis next month.  He has constipation relieved with high fiber foods and occasional anti laxatives and stool softners.  He denies a recurrence of his shingles.  He also denies hospitalizations or emergency room visits.  His appetite is good and his weight is stable.    MEDICAL HISTORY: Past Medical History  Diagnosis Date  . IHD (ischemic heart disease)     Remote MI in 2001 with stent to the LCX  . Hypertension   . Hyperlipidemia   . SOB (shortness of breath)     Chronic with known fibrosis  . Hypokinesia     MILD INFERIOR  . GERD (gastroesophageal reflux disease)   . Lactose intolerance   . Diverticulosis   . Hearing loss   . Pleural effusion, left   . Pericardial effusion July 2011    s/p pericardial window; last echo in August 2012 with minimal effusion  . Normal nuclear stress test 2009    EF 63%. No ischemia. 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  . Non-small cell carcinoma of lung     Dx in October of 2010  . Arthritis     knees  . Anxiety     takes wellbutrin daily  . Hx of radiation therapy 09/29/11;10/01/11;10/03/11;10/06/11;10/08/11;    RLLlung,50Gy/19fx   ONCOLOGY  HISTORY: 1. Squamous cell carcinoma of the left lung, status post FNA on 02/01/2009, stage T4 N2 M0, stage IIIB, involving left upper lobe and hilar region. The patient was treated with weekly carboplatin and Taxol in combination with radiation treatments, 6,600 cGy in 33 fractions, directed at the left upper lobe and left hilar region from 02/22/2009 through 04/12/2009. He then received consolidative chemotherapy with cisplatin and gemcitabine for 4 cycles from  06/04/2009 through 09/06/2009. He received Neulasta during those treatments. The patient is no longer on any treatment. His last PET scan was on 08/05/2011. His last CT scan of the chest was on 11/13/2011. His last 2D echocardiogram was on 11/12/2010.  2.Melanoma involving the left face, T3a, status post wide excision on 02/12/2009. Melanoma in situ involving the chest in February 2007.   3.Squamous cell carcinoma in a right lower lobe nodule which was detected by scheduled surveillance CT scan of the chest carried out on 07/21/2011, confirmed by PET scan on 08/05/2011 and ultimately by transbronchial Wang needle biopsy from the right lower lobe on 08/27/2011. This lesion is most likely a new primary lung cancer, Stage T1N0. The patient underwent stereotactic radiation beginning on 09/29/2011 through 10/08/2011. The patient received a total of 5 fractions, 10 Gray per fraction equaling 50 Gray to the right lower lobe lesion. He tolerated these  treatments well.  INTERIM HISTORY: has MALIGNANT NEOPLASM UPPER LOBE BRONCHUS OR LUNG; HYPERLIPIDEMIA; MYOCARDIAL INFARCTION; EMPHYSEMA; MELANOMA, HX OF; Pericardial effusion; CAD (coronary artery disease); PVC's (premature ventricular contractions); and Pulmonary nodule on his problem list.    ALLERGIES:  is allergic to sertraline hcl.  MEDICATIONS: has a current medication list which includes the following prescription(s): atorvastatin, bupropion, clopidogrel, metoprolol succinate, tamsulosin, temazepam,  venlafaxine xr, and pantoprazole.  SURGICAL HISTORY:  Past Surgical History  Procedure Laterality Date  . Pericardial window  July 2011  . Cardiovascular stress test  05/2007    EF 63%  . Coronary stent placement  07/1999    STENT TO THE LEFT CIRCUMFLEX  . Cardiac catheterization  07/11/2003    EF 60%, REPEAT, WHICH SHOWED 30% PROXIMAL NARROWING IN THE RIGHT  CORONARY ARTERY  . Transthoracic echocardiogram  02/22/2010    EF 55-60%  . Hernia repair      as an adult- R inguinal     REVIEW OF SYSTEMS:   Constitutional: Denies fevers, chills or abnormal weight loss Eyes: Denies blurriness of vision Ears, nose, mouth, throat, and face: Denies mucositis or sore throat Respiratory: Denies cough, dyspnea or wheezes Cardiovascular: Denies palpitation, chest discomfort or lower extremity swelling Gastrointestinal:  Denies nausea, heartburn or change in bowel habits Skin: Denies abnormal skin rashes Lymphatics: Denies new lymphadenopathy or easy bruising Neurological:Denies numbness, tingling or new weaknesses Behavioral/Psych: Mood is stable, no new changes  All other systems were reviewed with the patient and are negative.  PHYSICAL EXAMINATION: ECOG PERFORMANCE STATUS: 1 - Symptomatic but completely ambulatory  Blood pressure 95/65, pulse 63, temperature 97.2 F (36.2 C), temperature source Oral, resp. rate 18, height 5\' 10"  (1.778 m), weight 187 lb 4.8 oz (84.959 kg).  GENERAL:alert, no distress and comfortable; mildly obese with R sided port.  SKIN: skin color, texture, turgor are normal, no rashes or significant lesions; Left face scar status post melanoma resection without signs  Of recurrence.  EYES: normal, Conjunctiva are pink and some mild injection on the left, sclera clear OROPHARYNX:no exudate, no erythema and lips, buccal mucosa, and tongue normal  NECK: supple, thyroid normal size, non-tender, without nodularity LYMPH:  no palpable lymphadenopathy in the cervical, axillary  or inguinal LUNGS: Decreased breath sounds bilaterally (L > R) with normal breathing effort HEART: regular rate & rhythm and no murmurs and no lower extremity edema ABDOMEN:abdomen soft, non-tender and normal bowel sounds Musculoskeletal:no cyanosis of digits and no clubbing  NEURO: alert & oriented x 3 with fluent speech, no focal motor/sensory deficits   LABORATORY DATA: Results for orders placed in visit on 10/25/12 (from the past 48 hour(s))  CBC WITH DIFFERENTIAL     Status: Abnormal   Collection Time    12/28/12  8:12 AM      Result Value Range   WBC 7.3  4.0 - 10.3 10e3/uL   NEUT# 5.4  1.5 - 6.5 10e3/uL   HGB 13.9  13.0 - 17.1 g/dL   HCT 16.1  09.6 - 04.5 %   Platelets 232  140 - 400 10e3/uL   MCV 91.3  79.3 - 98.0 fL   MCH 30.4  27.2 - 33.4 pg   MCHC 33.3  32.0 - 36.0 g/dL   RBC 4.09  8.11 - 9.14 10e6/uL   RDW 13.7  11.0 - 14.6 %   lymph# 0.9  0.9 - 3.3 10e3/uL   MONO# 0.8  0.1 - 0.9 10e3/uL   Eosinophils Absolute 0.1  0.0 - 0.5  10e3/uL   Basophils Absolute 0.1  0.0 - 0.1 10e3/uL   NEUT% 74.2  39.0 - 75.0 %   LYMPH% 11.7 (*) 14.0 - 49.0 %   MONO% 11.2  0.0 - 14.0 %   EOS% 2.0  0.0 - 7.0 %   BASO% 0.9  0.0 - 2.0 %  LACTATE DEHYDROGENASE (CC13)     Status: None   Collection Time    12/28/12  8:12 AM      Result Value Range   LDH 170  125 - 245 U/L  CEA     Status: None   Collection Time    12/28/12  8:12 AM      Result Value Range   CEA 1.6  0.0 - 5.0 ng/mL  COMPREHENSIVE METABOLIC PANEL (CC13)     Status: Abnormal   Collection Time    12/28/12  8:12 AM      Result Value Range   Sodium 141  136 - 145 mEq/L   Potassium 4.5  3.5 - 5.1 mEq/L   Chloride 106  98 - 109 mEq/L   CO2 25  22 - 29 mEq/L   Glucose 89  70 - 140 mg/dl   BUN 95.6  7.0 - 21.3 mg/dL   Creatinine 0.8  0.7 - 1.3 mg/dL   Total Bilirubin 0.86  0.20 - 1.20 mg/dL   Alkaline Phosphatase 144  40 - 150 U/L   AST 18  5 - 34 U/L   ALT 16  0 - 55 U/L   Total Protein 6.9  6.4 - 8.3 g/dL   Albumin  3.4 (*) 3.5 - 5.0 g/dL   Calcium 9.2  8.4 - 57.8 mg/dL    CMP     Component Value Date/Time   NA 141 12/28/2012 0812   NA 142 11/11/2011 1127   K 4.5 12/28/2012 0812   K 4.6 11/11/2011 1127   CL 104 08/27/2012 1421   CL 105 11/11/2011 1127   CO2 25 12/28/2012 0812   CO2 27 11/11/2011 1127   GLUCOSE 89 12/28/2012 0812   GLUCOSE 72 08/27/2012 1421   GLUCOSE 81 11/11/2011 1127   BUN 16.0 12/28/2012 0812   BUN 14 11/11/2011 1127   CREATININE 0.8 12/28/2012 0812   CREATININE 0.91 11/11/2011 1127   CALCIUM 9.2 12/28/2012 0812   CALCIUM 9.5 11/11/2011 1127   PROT 6.9 12/28/2012 0812   PROT 6.9 11/11/2011 1127   ALBUMIN 3.4* 12/28/2012 0812   ALBUMIN 4.0 11/11/2011 1127   AST 18 12/28/2012 0812   AST 18 11/11/2011 1127   ALT 16 12/28/2012 0812   ALT 10 11/11/2011 1127   ALKPHOS 144 12/28/2012 0812   ALKPHOS 77 11/11/2011 1127   BILITOT 0.82 12/28/2012 0812   BILITOT 0.8 11/11/2011 1127   GFRNONAA 81* 08/19/2011 1437   GFRAA >90 08/19/2011 1437   RADIOGRAPHIC STUDIES: CT CHEST WITH CONTRAST (10-06-12) Right-sided Port-A-Cath tip is positioned at the mid SVC.  No axillary lymphadenopathy. Loculated pleural fluid in the upper left hemithorax is stable. The soft tissue attenuation in and around the left hilum is slightly progressed, measuring 3.4 x 6.6 cm today compared to 2.1 x 3.6 cm previously. There may be a component of increased collapse lung contributing to the more prominent appearance today. Loculated pleural fluid at the left base has increased slightly in the interval. Stable appearance of mild to moderate pericardial fluid. Lung windows show progression of airspace opacity in the central right lower lobe, tracking up into the right  hilum. This is the area of new airspace disease seen on the previous study which is not become more confluent. Neoplasm in this location is not excluded. PET CT may prove helpful to further evaluate. Bone windows reveal no worrisome lytic or sclerotic osseous  lesions.  IMPRESSION:   Interval progression of soft tissue attenuation in and around the left hilum may reflect evolving post treatment changes, but disease progression is a concern. Interval development of focal ill-defined opacity in the central right lower lobe infrahilar region. The patient had known squamous cell carcinoma in this region and underwent stereotactic radiation about a year ago. The previous CT scan was immediately after the completion of the radiation treatment and the appearance on today's CT scan may be related to interval development of fibrosis although disease progression cannot be excluded given the lack of  intervening imaging. Follow-up or PET CT may prove helpful to further assess.  ASSESSMENT: 1)Squamous cell carcinoma of the left lung [02/01/2009]. 2) Melanoma involving the left face [02/12/2013]. 3)  Squamous cell carcinoma in a right lower lobe nodule which was detected by scheduled surveillance CT scan of the chest carried out on 07/21/2011  PLAN:  Squamous cell carcinoma in a right lower lobe nodule which was detected by scheduled surveillance CT scan of the chest carried out on 07/21/2011, confirmed by PET scan on 08/05/2011 and ultimately by transbronchial Wang needle biopsy from the right lower lobe on 08/27/2011. This lesion was likely a new primary lung cancer, Stage T1N0. The patient underwent stereotactic radiation beginning on 09/29/2011 through 10/08/2011. The patient received a total of 5 fractions, 10 Gray per fraction equaling 50 Gray to the right lower lobe lesion. He tolerated these treatments well.  CT scans reviewed and he will have repeat scans later this month for followup.  Should the area in the right lower lobe be consistent with recurrent disease, he will not be a candidate for further XRT or surgery so he will be offered palliative chemotherapy alone.  He understood this plan.   All questions were answered. The patient knows to call the clinic with any problems,  questions or concerns. We can certainly see the patient much sooner if necessary.  RTC in 4 months for labs including CBC, chemistries and CEA.   I spent 20 minutes counseling the patient face to face. The total time spent in the appointment was 40 minutes.    Damiah Mcdonald, MD 12/29/2012 12:18 PM

## 2012-12-31 ENCOUNTER — Ambulatory Visit: Payer: Medicare Other

## 2013-01-03 ENCOUNTER — Ambulatory Visit (HOSPITAL_COMMUNITY)
Admission: RE | Admit: 2013-01-03 | Discharge: 2013-01-03 | Disposition: A | Discharge status: Home / Self Care | Payer: Medicare Other | Source: Ambulatory Visit | Attending: Radiation Oncology | Admitting: Radiation Oncology

## 2013-01-03 DIAGNOSIS — J90 Pleural effusion, not elsewhere classified: Secondary | ICD-10-CM | POA: Insufficient documentation

## 2013-01-03 DIAGNOSIS — R911 Solitary pulmonary nodule: Secondary | ICD-10-CM | POA: Diagnosis not present

## 2013-01-03 DIAGNOSIS — C3490 Malignant neoplasm of unspecified part of unspecified bronchus or lung: Secondary | ICD-10-CM | POA: Insufficient documentation

## 2013-01-03 DIAGNOSIS — I319 Disease of pericardium, unspecified: Secondary | ICD-10-CM | POA: Diagnosis not present

## 2013-01-03 MED ORDER — IOHEXOL 300 MG/ML  SOLN
80.0000 mL | Freq: Once | INTRAMUSCULAR | Status: AC | PRN
  Administered 2013-01-03: 80 mL via INTRAVENOUS

## 2013-01-13 ENCOUNTER — Ambulatory Visit
Admission: RE | Admit: 2013-01-13 | Discharge: 2013-01-13 | Disposition: A | Discharge status: Home / Self Care | Payer: Medicare Other | Source: Ambulatory Visit | Attending: Radiation Oncology | Admitting: Radiation Oncology

## 2013-01-13 ENCOUNTER — Telehealth: Payer: Self-pay | Admitting: *Deleted

## 2013-01-13 ENCOUNTER — Encounter: Payer: Self-pay | Admitting: Radiation Oncology

## 2013-01-13 DIAGNOSIS — C3410 Malignant neoplasm of upper lobe, unspecified bronchus or lung: Secondary | ICD-10-CM | POA: Diagnosis not present

## 2013-01-13 NOTE — Telephone Encounter (Signed)
CALLED PATIENT TO INFORM OF LAB, TEST AND FU VISIT, LVM FOR A RETURN CALL 

## 2013-01-13 NOTE — Telephone Encounter (Signed)
XXXX 

## 2013-01-13 NOTE — Progress Notes (Signed)
Department of Radiation Oncology  Phone:  [phone] Fax:        [phone]   Name: [name] Fields MRN: [id number]  DOB: 11-28-1934  Date: 01/13/2013  Follow Up Visit Note  Diagnosis: Stage III non-small cell lung cancer treated with concurrent chemoradiation completed 2010 followed by adjuvant chemotherapy. Most recently a T1N0 right lower lobe non-small cell lung cancer treated with SB RT 50 gray in 5 fractions completed 10/08/2011.  Interval since last radiation: One year  Interval History: [name] Fields presents today for routine followup.  He is feeling well and doing well. He is maintaining his weight. He has stable dyspnea on exertion and a stable chronic cough. He really he wishes to you do more in terms of walking. He walks about 40 minutes a day. He has difficulty climbing his basement stairs and has to sit on a chair at the top of the stairs each time he walks up and down them. Going down he states he is okay at since coming off. Before treatment for lung cancer he was able to walk about an hour and a half a day. He denies any chest pain. He denies any headaches bone pain or hemoptysis. He had a CT of the chest on 01/03/2013 which showed stability of the left hilar lesion. At the right lower lobe lesion was also stable.  Allergies:  Allergies  Allergen Reactions  . Sertraline Hcl Other (See Comments)    unknown    Medications:  Current Outpatient Prescriptions  Medication Sig Dispense Refill  . atorvastatin (LIPITOR) 20 MG tablet Take 20 mg by mouth daily.       Marland Kitchen buPROPion (WELLBUTRIN SR) 150 MG 12 hr tablet Take 150 mg by mouth daily.       . clopidogrel (PLAVIX) 75 MG tablet Take 1 tablet (75 mg total) by mouth daily. TO BE RESTARTED ON FRIDAY 08/29/11      . metoprolol succinate (TOPROL-XL) 25 MG 24 hr tablet Take 25 mg by mouth daily.      . pantoprazole (PROTONIX) 40 MG tablet Take 1 tablet (40 mg total) by mouth 2 (two) times daily.  60 tablet  0  . Tamsulosin  HCl (FLOMAX) 0.4 MG CAPS Take 0.4 mg by mouth daily with supper.       . temazepam (RESTORIL) 15 MG capsule TAKE 1 CAPSULE BY MOUTH AT BEDTIME AS NEEDED FOR SLEEP. MAY REPEAT ONCE.  60 capsule  2  . venlafaxine XR (EFFEXOR-XR) 150 MG 24 hr capsule Take by mouth daily.        No current facility-administered medications for this encounter.    Physical Exam:  Filed Vitals:   01/13/13 1252  BP: 101/59  Pulse: 67  Temp: 97.3 F (36.3 C)  Resp: 20   he is a pleasant male in no distress sitting comfortably examining table. His pulse ox  IMPRESSION: [name] Fields is a 78 y.o. male status post radiation with concurrent chemotherapy to the left hilum as well as SBRT to a right lower lobe nodule one year ago  PLAN:  I reviewed his CT scan results with him. I would like to repeat a scan in 3 months just to ensure stability of this left hilar area. If it is stable then we can go to every 6 months. I've also contact Dr. Stann Mainland to see if perhaps he would be a candidate for pulmonary rehabilitation to see if we can help him increase his lung function and improve his quality of life. I  will see him back in January. He knows to contact me with any questions or concerns by in the interim.   [name], MD

## 2013-01-13 NOTE — Progress Notes (Signed)
Pt denies pain, fatigue, loss of appetite, cough. He does states he has SOB w/minimal activity. Pt walks every day.

## 2013-01-14 ENCOUNTER — Ambulatory Visit: Payer: Medicare Other | Admitting: Radiation Oncology

## 2013-01-14 ENCOUNTER — Telehealth: Payer: Self-pay | Admitting: Pulmonary Disease

## 2013-01-14 NOTE — Telephone Encounter (Signed)
[name] Fields, let pt know that I received a message from his radiation oncologist about referring to pulmonary rehab.  I haven't seen him since 2012, and he needs ov.

## 2013-01-15 ENCOUNTER — Other Ambulatory Visit: Payer: Self-pay | Admitting: Cardiology

## 2013-01-18 NOTE — Telephone Encounter (Signed)
ATC x1  NA 

## 2013-01-23 ENCOUNTER — Other Ambulatory Visit: Payer: Self-pay | Admitting: Oncology

## 2013-01-23 DIAGNOSIS — Z85820 Personal history of malignant melanoma of skin: Secondary | ICD-10-CM

## 2013-01-27 ENCOUNTER — Ambulatory Visit (INDEPENDENT_AMBULATORY_CARE_PROVIDER_SITE_OTHER): Payer: Medicare Other | Admitting: Cardiology

## 2013-01-27 ENCOUNTER — Encounter: Payer: Self-pay | Admitting: Cardiology

## 2013-01-27 VITALS — BP 106/73 | HR 60 | Ht 70.0 in | Wt 187.0 lb

## 2013-01-27 DIAGNOSIS — I493 Ventricular premature depolarization: Secondary | ICD-10-CM

## 2013-01-27 DIAGNOSIS — I4949 Other premature depolarization: Secondary | ICD-10-CM

## 2013-01-27 DIAGNOSIS — C3410 Malignant neoplasm of upper lobe, unspecified bronchus or lung: Secondary | ICD-10-CM

## 2013-01-27 DIAGNOSIS — I319 Disease of pericardium, unspecified: Secondary | ICD-10-CM

## 2013-01-27 DIAGNOSIS — E785 Hyperlipidemia, unspecified: Secondary | ICD-10-CM

## 2013-01-27 DIAGNOSIS — I313 Pericardial effusion (noninflammatory): Secondary | ICD-10-CM

## 2013-01-27 DIAGNOSIS — I252 Old myocardial infarction: Secondary | ICD-10-CM

## 2013-01-27 DIAGNOSIS — I2510 Atherosclerotic heart disease of native coronary artery without angina pectoris: Secondary | ICD-10-CM | POA: Diagnosis not present

## 2013-01-27 DIAGNOSIS — J438 Other emphysema: Secondary | ICD-10-CM

## 2013-01-27 NOTE — Progress Notes (Addendum)
[address] [place], [place]  [address] Phone: [phone] Fax:  [phone]  Date:  01/27/2013   ID:  [name] Fields, DOB [birth year], MRN [id number]  PCP:  [name], MD   History of Present Illness: [name] Fields is a 78 y.o. male with coronary artery disease status post multiple stents in the past, former patient of Dr. Deborah Chalk here for followup. Done with radiation tx for lung cancer. CT scan 01/03/13.   In April of 2001, lateral wall MI, circumflex stent with brachii therapy and angioplasty of stent in 2001. In 2011 underwent a pericardial window because of pericardial effusion. He walks 6 days a week a day. COPD, Dr. Delton Coombes.   LDL 63 on 03/23/12, creatinine 0.92.   Wt Readings from Last 3 Encounters:  01/27/13 187 lb (84.823 kg)  01/13/13 191 lb 4.8 oz (86.773 kg)  12/28/12 187 lb 4.8 oz (84.959 kg)     Past Medical History  Diagnosis Date  . IHD (ischemic heart disease)     Remote MI in 2001 with stent to the LCX  . Hypertension   . Hyperlipidemia   . SOB (shortness of breath)     Chronic with known fibrosis  . Hypokinesia     MILD INFERIOR  . GERD (gastroesophageal reflux disease)   . Lactose intolerance   . Diverticulosis   . Hearing loss   . Pleural effusion, left   . Pericardial effusion July 2011    s/p pericardial window; last echo in August 2012 with minimal effusion  . Normal nuclear stress test 2009    EF 63%. No ischemia. 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  . Non-small cell carcinoma of lung     Dx in October of 2010  . Arthritis     knees  . Anxiety     takes wellbutrin daily  . Hx of radiation therapy 09/29/11;10/01/11;10/03/11;10/06/11;10/08/11;    RLLlung,50Gy/11fx    Past Surgical History  Procedure Laterality Date  . Pericardial window  July 2011  . Cardiovascular stress test  05/2007    EF 63%  . Coronary stent placement  07/1999    STENT TO THE LEFT CIRCUMFLEX  . Cardiac catheterization  07/11/2003    EF 60%,  REPEAT, WHICH SHOWED 30% PROXIMAL NARROWING IN THE RIGHT  CORONARY ARTERY  . Transthoracic echocardiogram  02/22/2010    EF 55-60%  . Hernia repair      as an adult- R inguinal     Current Outpatient Prescriptions  Medication Sig Dispense Refill  . atorvastatin (LIPITOR) 20 MG tablet TAKE ONE TABLET BY MOUTH ONE TIME DAILY  90 tablet  3  . buPROPion (WELLBUTRIN SR) 150 MG 12 hr tablet Take 150 mg by mouth daily.       . clopidogrel (PLAVIX) 75 MG tablet Take 1 tablet (75 mg total) by mouth daily. TO BE RESTARTED ON FRIDAY 08/29/11      . metoprolol succinate (TOPROL-XL) 25 MG 24 hr tablet Take 25 mg by mouth 2 (two) times daily.       . pantoprazole (PROTONIX) 40 MG tablet TAKE 1 TABLET BY MOUTH TWICE DAILY  180 tablet  0  . Tamsulosin HCl (FLOMAX) 0.4 MG CAPS Take 0.4 mg by mouth daily with supper.       . temazepam (RESTORIL) 15 MG capsule TAKE 1 CAPSULE BY MOUTH AT BEDTIME AS NEEDED FOR SLEEP. MAY REPEAT ONCE.  60 capsule  2  . venlafaxine  XR (EFFEXOR-XR) 150 MG 24 hr capsule Take by mouth daily.        No current facility-administered medications for this visit.    Allergies:    Allergies  Allergen Reactions  . Sertraline Hcl Other (See Comments)    unknown    Social History:  The patient  reports that he quit smoking about 18 years ago. His smoking use included Cigarettes. He has a 120 pack-year smoking history. He has never used smokeless tobacco. He reports that he does not drink alcohol or use illicit drugs.   ROS:  Please see the history of present illness.   Denies any syncope, bleeding, orthopnea, PND    PHYSICAL EXAM: VS:  BP 106/73  Ht 5\' 10"  (1.778 m)  Wt 187 lb (84.823 kg)  BMI 26.83 kg/m2 Well nourished, well developed, in no acute distress HEENT: Multiple excoriations on nose Neck: no JVD Cardiac:  normal S1, S2; RRR; no murmur Lungs:   + wheezing,no  rhonchi or rales Abd: soft, nontender, no hepatomegaly Ext: no edema Skin: warm and dry Neuro: no focal  abnormalities noted  Nuclear stress test 07/19/12-basal to mid lateral wall fixed defect consistent with infarct. No ischemia, EF 58%.  EKG demonstrates sinus rhythm, right bundle branch block, first degree AV block, PR interval 226 ms.  ASSESSMENT AND PLAN:  1. Lung cancer-currently maxed out on radiation therapy. Recent CT scan 9/14. Been monitored closely by oncology/radiation oncology. 2. Coronary artery disease-no active anginal symptoms. 3. Prior PCI's-no recent anginal symptoms. 4. Hyperlipidemia-continue with statin therapy. 5. Former tobacco use 6. COPD-Dr. Delton Coombes, I do appreciate some active wheezing. Continue with inhalers 7. 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 I told him to contact me if any further cardiovascular needs are needed in the meantime, I will see him back in 6 months.  Signed, Donato Schultz, MD [facility]  01/27/2013 9:52 AM

## 2013-01-27 NOTE — Patient Instructions (Signed)
Your physician recommends that you continue on your current medications as directed. Please refer to the Current Medication list given to you today.  Your physician wants you to follow-up in: 6 months with Dr. Skains. You will receive a reminder letter in the mail two months in advance. If you don't receive a letter, please call our office to schedule the follow-up appointment.  

## 2013-01-27 NOTE — Telephone Encounter (Signed)
LMOM x 1 to [facility] to schedule appt

## 2013-02-04 NOTE — Telephone Encounter (Signed)
Pt scheduled for 10:45 on 11/4 with [facility]

## 2013-02-07 ENCOUNTER — Telehealth: Payer: Self-pay | Admitting: Pulmonary Disease

## 2013-02-07 NOTE — Telephone Encounter (Signed)
I spoke with pt. I advised him it looks like he may have gotten a call regarding his scheduled appt tomorrow.  He voiced his understanding and needed nothing further

## 2013-02-08 ENCOUNTER — Encounter: Payer: Self-pay | Admitting: Pulmonary Disease

## 2013-02-08 ENCOUNTER — Ambulatory Visit (INDEPENDENT_AMBULATORY_CARE_PROVIDER_SITE_OTHER): Payer: Medicare Other | Admitting: Pulmonary Disease

## 2013-02-08 VITALS — BP 98/64 | HR 92 | Temp 97.4°F | Ht 70.0 in | Wt 190.0 lb

## 2013-02-08 DIAGNOSIS — Z23 Encounter for immunization: Secondary | ICD-10-CM | POA: Diagnosis not present

## 2013-02-08 DIAGNOSIS — J438 Other emphysema: Secondary | ICD-10-CM | POA: Diagnosis not present

## 2013-02-08 DIAGNOSIS — J439 Emphysema, unspecified: Secondary | ICD-10-CM

## 2013-02-08 NOTE — Patient Instructions (Signed)
Will try you on ANORO one inhalation each am everyday for next 4 weeks. Albuterol inhaler, 2 puffs up to every 6hrs for rescue only.  Do not use if you are not having a lot of problem breathing. Will give you a flu shot today Continue with your exercise program at home, but let me know if you would like to consider pulmonary rehab at hospital. Please call me in 4 weeks with how things are going, and your response to [facility].  We can then set up a followup visit in the future.

## 2013-02-08 NOTE — Assessment & Plan Note (Signed)
The patient has a history of moderate COPD, but has had radiation to his right lung with scarring, and has a lot of changes in his left lung as well.  He currently is not on a bronchodilator regimen, and I would like to try him on something that he has not had in the past.  He tells me that he is exercising 40 minutes every day, and therefore he may or may not benefit from pulmonary rehabilitation in a formal program.  He would like to try the new medication, and see how he responds.  He will let me know if he wishes to enroll in the pulmonary rehabilitation program at [facility].

## 2013-02-08 NOTE — Progress Notes (Signed)
  Subjective:    Patient ID: [name] Fields, male    DOB: 1934-06-25, 78 y.o.   MRN: [id number]  HPI The patient comes in today for followup after a two-year hiatus.  He has known moderate COPD, and has also had changes in his right and left hemithorax associated with treatment of his lung cancers.  He currently has at least moderate level dyspnea on exertion, and currently is not on a bronchodilator regimen.  He does force himself to walk 40 minutes every single day, but has never been to a formal pulmonary rehabilitation program.  He denies chronic cough or mucus production.   Review of Systems  Constitutional: Negative for fever and unexpected weight change.  HENT: Negative for congestion, dental problem, ear pain, nosebleeds, postnasal drip, rhinorrhea, sinus pressure, sneezing, sore throat and trouble swallowing.   Eyes: Negative for redness and itching.  Respiratory: Positive for shortness of breath. Negative for cough, chest tightness and wheezing.   Cardiovascular: Negative for palpitations and leg swelling.  Gastrointestinal: Negative for nausea and vomiting.  Genitourinary: Negative for dysuria.  Musculoskeletal: Negative for joint swelling.  Skin: Negative for rash.  Neurological: Negative for headaches.  Hematological: Does not bruise/bleed easily.  Psychiatric/Behavioral: Negative for dysphoric mood. The patient is not nervous/anxious.        Objective:   Physical Exam Thin male in no acute distress Nose without purulence or discharge noted Neck without lymphadenopathy or thyromegaly Chest with decreased breath sounds in the left base with crackles, decreased breath sounds overall, no active wheezing Cardiac exam with regular rate and rhythm, 3/6 systolic murmur Lower extremities without significant edema, no cyanosis Alert and oriented, moves all 4 extremities.       Assessment & Plan:

## 2013-02-22 ENCOUNTER — Ambulatory Visit (HOSPITAL_BASED_OUTPATIENT_CLINIC_OR_DEPARTMENT_OTHER): Payer: Medicare Other

## 2013-02-22 DIAGNOSIS — Z452 Encounter for adjustment and management of vascular access device: Secondary | ICD-10-CM

## 2013-02-22 DIAGNOSIS — H179 Unspecified corneal scar and opacity: Secondary | ICD-10-CM | POA: Diagnosis not present

## 2013-02-22 DIAGNOSIS — H04129 Dry eye syndrome of unspecified lacrimal gland: Secondary | ICD-10-CM | POA: Diagnosis not present

## 2013-02-22 DIAGNOSIS — Z95828 Presence of other vascular implants and grafts: Secondary | ICD-10-CM

## 2013-02-22 DIAGNOSIS — H43319 Vitreous membranes and strands, unspecified eye: Secondary | ICD-10-CM | POA: Diagnosis not present

## 2013-02-22 DIAGNOSIS — C3410 Malignant neoplasm of upper lobe, unspecified bronchus or lung: Secondary | ICD-10-CM

## 2013-02-22 DIAGNOSIS — H259 Unspecified age-related cataract: Secondary | ICD-10-CM | POA: Diagnosis not present

## 2013-02-22 MED ORDER — HEPARIN SOD (PORK) LOCK FLUSH 100 UNIT/ML IV SOLN
500.0000 [IU] | Freq: Once | INTRAVENOUS | Status: AC
  Administered 2013-02-22: 500 [IU] via INTRAVENOUS
  Filled 2013-02-22: qty 5

## 2013-02-22 MED ORDER — SODIUM CHLORIDE 0.9 % IJ SOLN
10.0000 mL | INTRAMUSCULAR | Status: DC | PRN
  Administered 2013-02-22: 10 mL via INTRAVENOUS
  Filled 2013-02-22: qty 10

## 2013-02-23 ENCOUNTER — Telehealth: Payer: Self-pay | Admitting: Internal Medicine

## 2013-02-23 DIAGNOSIS — H25049 Posterior subcapsular polar age-related cataract, unspecified eye: Secondary | ICD-10-CM | POA: Diagnosis not present

## 2013-02-23 DIAGNOSIS — H18419 Arcus senilis, unspecified eye: Secondary | ICD-10-CM | POA: Diagnosis not present

## 2013-02-23 DIAGNOSIS — B0232 Zoster iridocyclitis: Secondary | ICD-10-CM | POA: Diagnosis not present

## 2013-02-23 DIAGNOSIS — H2510 Age-related nuclear cataract, unspecified eye: Secondary | ICD-10-CM | POA: Diagnosis not present

## 2013-02-23 NOTE — Telephone Encounter (Signed)
appts made per 11/18 POF  Cal mailed shh

## 2013-02-28 ENCOUNTER — Other Ambulatory Visit: Payer: Self-pay | Admitting: *Deleted

## 2013-02-28 DIAGNOSIS — E782 Mixed hyperlipidemia: Secondary | ICD-10-CM

## 2013-02-28 DIAGNOSIS — Z79899 Other long term (current) drug therapy: Secondary | ICD-10-CM

## 2013-03-16 DIAGNOSIS — I10 Essential (primary) hypertension: Secondary | ICD-10-CM | POA: Diagnosis not present

## 2013-03-16 DIAGNOSIS — K5900 Constipation, unspecified: Secondary | ICD-10-CM | POA: Diagnosis not present

## 2013-03-16 DIAGNOSIS — Z1331 Encounter for screening for depression: Secondary | ICD-10-CM | POA: Diagnosis not present

## 2013-03-16 DIAGNOSIS — R0609 Other forms of dyspnea: Secondary | ICD-10-CM | POA: Diagnosis not present

## 2013-03-16 DIAGNOSIS — E782 Mixed hyperlipidemia: Secondary | ICD-10-CM | POA: Diagnosis not present

## 2013-03-16 DIAGNOSIS — I2510 Atherosclerotic heart disease of native coronary artery without angina pectoris: Secondary | ICD-10-CM | POA: Diagnosis not present

## 2013-03-16 DIAGNOSIS — Z0000 Encounter for general adult medical examination without abnormal findings: Secondary | ICD-10-CM | POA: Diagnosis not present

## 2013-03-16 DIAGNOSIS — E559 Vitamin D deficiency, unspecified: Secondary | ICD-10-CM | POA: Diagnosis not present

## 2013-03-16 DIAGNOSIS — N400 Enlarged prostate without lower urinary tract symptoms: Secondary | ICD-10-CM | POA: Diagnosis not present

## 2013-03-16 DIAGNOSIS — J449 Chronic obstructive pulmonary disease, unspecified: Secondary | ICD-10-CM | POA: Diagnosis not present

## 2013-03-24 ENCOUNTER — Other Ambulatory Visit: Payer: Medicare Other

## 2013-04-15 ENCOUNTER — Ambulatory Visit
Admission: RE | Admit: 2013-04-15 | Discharge: 2013-04-15 | Disposition: A | Discharge status: Home / Self Care | Payer: Medicare Other | Source: Ambulatory Visit | Attending: Radiation Oncology | Admitting: Radiation Oncology

## 2013-04-15 ENCOUNTER — Ambulatory Visit (HOSPITAL_BASED_OUTPATIENT_CLINIC_OR_DEPARTMENT_OTHER): Payer: Medicare Other

## 2013-04-15 VITALS — BP 111/61 | HR 67 | Temp 97.3°F

## 2013-04-15 DIAGNOSIS — C3410 Malignant neoplasm of upper lobe, unspecified bronchus or lung: Secondary | ICD-10-CM | POA: Diagnosis not present

## 2013-04-15 DIAGNOSIS — Z452 Encounter for adjustment and management of vascular access device: Secondary | ICD-10-CM | POA: Diagnosis not present

## 2013-04-15 DIAGNOSIS — Z95828 Presence of other vascular implants and grafts: Secondary | ICD-10-CM

## 2013-04-15 LAB — BUN AND CREATININE (CC13)
BUN: 16.4 mg/dL (ref 7.0–26.0)
CREATININE: 0.9 mg/dL (ref 0.7–1.3)

## 2013-04-15 MED ORDER — HEPARIN SOD (PORK) LOCK FLUSH 100 UNIT/ML IV SOLN
500.0000 [IU] | Freq: Once | INTRAVENOUS | Status: AC
  Administered 2013-04-15: 500 [IU] via INTRAVENOUS
  Filled 2013-04-15: qty 5

## 2013-04-15 MED ORDER — SODIUM CHLORIDE 0.9 % IJ SOLN
10.0000 mL | INTRAMUSCULAR | Status: DC | PRN
  Administered 2013-04-15: 10 mL via INTRAVENOUS
  Filled 2013-04-15: qty 10

## 2013-04-15 NOTE — Patient Instructions (Signed)
Implanted Port Instructions  An implanted port is a central line that has a round shape and is placed under the skin. It is used for long-term IV (intravenous) access for:  · Medicine.  · Fluids.  · Liquid nutrition, such as TPN (total parenteral nutrition).  · Blood samples.  Ports can be placed:  · In the chest area just below the collarbone (this is the most common place.)  · In the arms.  · In the belly (abdomen) area.  · In the legs.  PARTS OF THE PORT  A port has 2 main parts:  · The reservoir. The reservoir is round, disc-shaped, and will be a small, raised area under your skin.  · The reservoir is the part where a needle is inserted (accessed) to either give medicines or to draw blood.  · The catheter. The catheter is a long, slender tube that extends from the reservoir. The catheter is placed into a large vein.  · Medicine that is inserted into the reservoir goes into the catheter and then into the vein.  INSERTION OF THE PORT  · The port is surgically placed in either an operating room or in a procedural area (interventional radiology).  · Medicine may be given to help you relax during the procedure.  · The skin where the port will be inserted is numbed (local anesthetic).  · 1 or 2 small cuts (incisions) will be made in the skin to insert the port.  · The port can be used after it has been inserted.  INCISION SITE CARE  · The incision site may have small adhesive strips on it. This helps keep the incision site closed. Sometimes, no adhesive strips are placed. Instead of adhesive strips, a special kind of surgical glue is used to keep the incision closed.  · If adhesive strips were placed on the incision sites, do not take them off. They will fall off on their own.  · The incision site may be sore for 1 to 2 days. Pain medicine can help.  · Do not get the incision site wet. Bathe or shower as directed by your caregiver.  · The incision site should heal in 5 to 7 days. A small scar may form after the  incision has healed.  ACCESSING THE PORT  Special steps must be taken to access the port:  · Before the port is accessed, a numbing cream can be placed on the skin. This helps numb the skin over the port site.  · A sterile technique is used to access the port.  · The port is accessed with a needle. Only "non-coring" port needles should be used to access the port. Once the port is accessed, a blood return should be checked. This helps ensure the port is in the vein and is not clogged (clotted).  · If your caregiver believes your port should remain accessed, a clear (transparent) bandage will be placed over the needle site. The bandage and needle will need to be changed every week or as directed by your caregiver.  · Keep the bandage covering the needle clean and dry. Do not get it wet. Follow your caregiver's instructions on how to take a shower or bath when the port is accessed.  · If your port does not need to stay accessed, no bandage is needed over the port.  FLUSHING THE PORT  Flushing the port keeps it from getting clogged. How often the port is flushed depends on:  · If a   constant infusion is running. If a constant infusion is running, the port may not need to be flushed.  · If intermittent medicines are given.  · If the port is not being used.  For intermittent medicines:  · The port will need to be flushed:  · After medicines have been given.  · After blood has been drawn.  · As part of routine maintenance.  · A port is normally flushed with:  · Normal saline.  · Heparin.  · Follow your caregiver's advice on how often, how much, and the type of flush to use on your port.  IMPORTANT PORT INFORMATION  · Tell your caregiver if you are allergic to heparin.  · After your port is placed, you will get a manufacturer's information card. The card has information about your port. Keep this card with you at all times.  · There are many types of ports available. Know what kind of port you have.  · In case of an  emergency, it may be helpful to wear a medical alert bracelet. This can help alert health care workers that you have a port.  · The port can stay in for as long as your caregiver believes it is necessary.  · When it is time for the port to come out, surgery will be done to remove it. The surgery will be similar to how the port was put in.  · If you are in the hospital or clinic:  · Your port will be taken care of and flushed by a nurse.  · If you are at home:  · A home health care nurse may give medicines and take care of the port.  · You or a family member can get special training and directions for giving medicine and taking care of the port at home.  SEEK IMMEDIATE MEDICAL CARE IF:   · Your port does not flush or you are unable to get a blood return.  · New drainage or pus is coming from the incision.  · A bad smell is coming from the incision site.  · You develop swelling or increased redness at the incision site.  · You develop increased swelling or pain at the port site.  · You develop swelling or pain in the surrounding skin near the port.  · You have an oral temperature above 102° F (38.9° C), not controlled by medicine.  MAKE SURE YOU:   · Understand these instructions.  · Will watch your condition.  · Will get help right away if you are not doing well or get worse.  Document Released: 03/24/2005 Document Revised: 06/16/2011 Document Reviewed: 06/15/2008  [employer]® Patient Information ©2014 [employer].

## 2013-04-18 ENCOUNTER — Telehealth: Payer: Self-pay | Admitting: *Deleted

## 2013-04-18 ENCOUNTER — Ambulatory Visit (HOSPITAL_COMMUNITY): Payer: Medicare Other

## 2013-04-18 NOTE — Telephone Encounter (Signed)
Called patient to ask about rescheduling missed scan for 04-18-13, lvm for a return call

## 2013-04-19 ENCOUNTER — Telehealth: Payer: Self-pay | Admitting: Internal Medicine

## 2013-04-19 ENCOUNTER — Ambulatory Visit (HOSPITAL_BASED_OUTPATIENT_CLINIC_OR_DEPARTMENT_OTHER): Payer: Medicare Other | Admitting: Internal Medicine

## 2013-04-19 ENCOUNTER — Other Ambulatory Visit: Payer: Medicare Other | Admitting: Lab

## 2013-04-19 ENCOUNTER — Other Ambulatory Visit: Payer: Medicare Other

## 2013-04-19 ENCOUNTER — Ambulatory Visit (HOSPITAL_BASED_OUTPATIENT_CLINIC_OR_DEPARTMENT_OTHER): Payer: Medicare Other

## 2013-04-19 VITALS — BP 93/63 | HR 73 | Temp 97.3°F | Resp 18 | Ht 70.0 in | Wt 186.7 lb

## 2013-04-19 DIAGNOSIS — Z85820 Personal history of malignant melanoma of skin: Secondary | ICD-10-CM

## 2013-04-19 DIAGNOSIS — C3410 Malignant neoplasm of upper lobe, unspecified bronchus or lung: Secondary | ICD-10-CM

## 2013-04-19 DIAGNOSIS — J439 Emphysema, unspecified: Secondary | ICD-10-CM

## 2013-04-19 DIAGNOSIS — L989 Disorder of the skin and subcutaneous tissue, unspecified: Secondary | ICD-10-CM | POA: Insufficient documentation

## 2013-04-19 DIAGNOSIS — C3430 Malignant neoplasm of lower lobe, unspecified bronchus or lung: Secondary | ICD-10-CM | POA: Diagnosis not present

## 2013-04-19 DIAGNOSIS — Z85118 Personal history of other malignant neoplasm of bronchus and lung: Secondary | ICD-10-CM

## 2013-04-19 LAB — COMPREHENSIVE METABOLIC PANEL (CC13)
ALBUMIN: 3.7 g/dL (ref 3.5–5.0)
ALT: 19 U/L (ref 0–55)
AST: 17 U/L (ref 5–34)
Alkaline Phosphatase: 218 U/L — ABNORMAL HIGH (ref 40–150)
Anion Gap: 11 mEq/L (ref 3–11)
BUN: 19.1 mg/dL (ref 7.0–26.0)
CHLORIDE: 103 meq/L (ref 98–109)
CO2: 27 mEq/L (ref 22–29)
CREATININE: 0.9 mg/dL (ref 0.7–1.3)
Calcium: 9.8 mg/dL (ref 8.4–10.4)
GLUCOSE: 93 mg/dL (ref 70–140)
Potassium: 5 mEq/L (ref 3.5–5.1)
Sodium: 142 mEq/L (ref 136–145)
Total Bilirubin: 0.95 mg/dL (ref 0.20–1.20)
Total Protein: 7.5 g/dL (ref 6.4–8.3)

## 2013-04-19 LAB — CBC WITH DIFFERENTIAL/PLATELET
BASO%: 1 % (ref 0.0–2.0)
BASOS ABS: 0.1 10*3/uL (ref 0.0–0.1)
EOS ABS: 0.1 10*3/uL (ref 0.0–0.5)
EOS%: 1.7 % (ref 0.0–7.0)
HEMATOCRIT: 43.8 % (ref 38.4–49.9)
HEMOGLOBIN: 14.2 g/dL (ref 13.0–17.1)
LYMPH%: 13.2 % — ABNORMAL LOW (ref 14.0–49.0)
MCH: 29.1 pg (ref 27.2–33.4)
MCHC: 32.4 g/dL (ref 32.0–36.0)
MCV: 89.9 fL (ref 79.3–98.0)
MONO#: 0.8 10*3/uL (ref 0.1–0.9)
MONO%: 11 % (ref 0.0–14.0)
NEUT%: 73.1 % (ref 39.0–75.0)
NEUTROS ABS: 5.2 10*3/uL (ref 1.5–6.5)
Platelets: 282 10*3/uL (ref 140–400)
RBC: 4.87 10*6/uL (ref 4.20–5.82)
RDW: 15.3 % — ABNORMAL HIGH (ref 11.0–14.6)
WBC: 7.2 10*3/uL (ref 4.0–10.3)
lymph#: 0.9 10*3/uL (ref 0.9–3.3)

## 2013-04-19 NOTE — Telephone Encounter (Signed)
gave pt Appt for lab and MD April 2015 , sent to labs today, pt will ask PCP to make appt for Dermatology, per pt.

## 2013-04-19 NOTE — Patient Instructions (Addendum)
1. Patient instructed to follow up with dermatology for repeat skin exam.  2. CT of chest to establish stable chest disease.  3. Follow up with me in 3 months. 4. Labs today.

## 2013-04-19 NOTE — Progress Notes (Signed)
[facility] OFFICE PROGRESS NOTE  [name], MD [address] [place] [address]  DIAGNOSIS: MALIGNANT NEOPLASM UPPER LOBE BRONCHUS OR LUNG - Plan: CBC with Differential, Comprehensive metabolic panel (Cmet) - [facility], CEA  COPD (chronic obstructive pulmonary disease) with emphysema  MELANOMA, HX OF - Plan: Ambulatory referral to Dermatology  Skin lesion of face - Plan: Ambulatory referral to Dermatology  Chief Complaint  Patient presents with  . Malignant neoplasm of upper lobe, bronchus or lung, left    CURRENT THERAPY:  Active surveillance  INTERVAL HISTORY: [name] Fields 79 y.o. male with a history of Melanoma and NSCLC is here for followup.  He was last seen by me on 12/28/2012. He reports he feels the best he has felt in about 3 years.  He has baseline dyspnea with exertion coming up the step.  He has his right port flushed q 8 weeks for the past 4 years.  He had his CT scan of chest in September 29,2014that revealed no acute cardiopulmonary abnormalities, stable appearance of soft tissue attenuation around the left hilar region which may reflect post treatment changes, and consolidation/atelectasis within the right lower lobe is stable from previous exam and tiny subpleural nodule in the right upper lobe is new from previous exam.  He has constipation relieved with high fiber foods and occasional anti laxatives and stool softners.  He denies a recurrence of his shingles.  He also denies hospitalizations or emergency room visits.  His appetite is good and his weight is stable.  He was seen by his PCP (Dr. Inda Merlin) in the past few months without changes in his medications.   MEDICAL HISTORY: Past Medical History  Diagnosis Date  . IHD (ischemic heart disease)     Remote MI in 2001 with stent to the LCX  . Hypertension   . Hyperlipidemia   . SOB (shortness of breath)     Chronic with known fibrosis  . Hypokinesia     MILD INFERIOR  . GERD  (gastroesophageal reflux disease)   . Lactose intolerance   . Diverticulosis   . Hearing loss   . Pleural effusion, left   . Pericardial effusion July 2011    s/p pericardial window; last echo in August 2012 with minimal effusion  . Normal nuclear stress test 2009    EF 63%. No ischemia. 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  . Non-small cell carcinoma of lung     Dx in October of 2010  . Arthritis     knees  . Anxiety     takes wellbutrin daily  . Hx of radiation therapy 09/29/11;10/01/11;10/03/11;10/06/11;10/08/11;    RLLlung,50Gy/47fx   ONCOLOGY HISTORY: 1. Squamous cell carcinoma of the left lung, status post FNA on 02/01/2009, stage T4 N2 M0, stage IIIB, involving left upper lobe and hilar region. The patient was treated with weekly carboplatin and Taxol in combination with radiation treatments, 6,600 cGy in 33 fractions, directed at the left upper lobe and left hilar region from 02/22/2009 through 04/12/2009. He then received consolidative chemotherapy with cisplatin and gemcitabine for 4 cycles from 06/04/2009 through 09/06/2009. He received Neulasta during those treatments. The patient is no longer on any treatment. His last PET scan was on 08/05/2011. His last CT scan of the chest was on 11/13/2011. His last 2D echocardiogram was on 11/12/2010.  2.Melanoma involving the left face, T3a, status post wide excision on 02/12/2009. Melanoma in situ involving the chest in February 2007.   3.Squamous cell carcinoma  in a right lower lobe nodule which was detected by scheduled surveillance CT scan of the chest carried out on 07/21/2011, confirmed by PET scan on 08/05/2011 and ultimately by transbronchial Wang needle biopsy from the right lower lobe on 08/27/2011. This lesion is most likely a new primary lung cancer, Stage T1N0. The patient underwent stereotactic radiation beginning on 09/29/2011 through 10/08/2011. The patient received a total of 5 fractions, 10 Gray per fraction equaling 50 Gray to the right lower  lobe lesion. He tolerated these treatments well.  INTERIM HISTORY: has MALIGNANT NEOPLASM UPPER LOBE BRONCHUS OR LUNG; HYPERLIPIDEMIA; COPD (chronic obstructive pulmonary disease) with emphysema; MELANOMA, HX OF; Pericardial effusion; CAD (coronary artery disease); PVC's (premature ventricular contractions); Pulmonary nodule; Old myocardial infarction; and Skin lesion of face on his problem list.    ALLERGIES:  is allergic to sertraline hcl.  MEDICATIONS: has a current medication list which includes the following prescription(s): atorvastatin, bupropion, calcium citrate, clopidogrel, linaclotide, metoprolol succinate, pantoprazole, tamsulosin, temazepam, and venlafaxine xr.  SURGICAL HISTORY:  Past Surgical History  Procedure Laterality Date  . Pericardial window  July 2011  . Cardiovascular stress test  05/2007    EF 63%  . Coronary stent placement  07/1999    STENT TO THE LEFT CIRCUMFLEX  . Cardiac catheterization  07/11/2003    EF 60%, REPEAT, WHICH SHOWED 30% PROXIMAL NARROWING IN THE RIGHT  CORONARY ARTERY  . Transthoracic echocardiogram  02/22/2010    EF 55-60%  . Hernia repair      as an adult- R inguinal     REVIEW OF SYSTEMS:   Constitutional: Denies fevers, chills or abnormal weight loss Eyes: Denies blurriness of vision Ears, nose, mouth, throat, and face: Denies mucositis or sore throat Respiratory: Denies cough, dyspnea or wheezes Cardiovascular: Denies palpitation, chest discomfort or lower extremity swelling Gastrointestinal:  Denies nausea, heartburn or change in bowel habits Skin: Denies abnormal skin rashes Lymphatics: Denies new lymphadenopathy or easy bruising Neurological:Denies numbness, tingling or new weaknesses Behavioral/Psych: Mood is stable, no new changes  All other systems were reviewed with the patient and are negative.  PHYSICAL EXAMINATION: ECOG PERFORMANCE STATUS: 1 - Symptomatic but completely ambulatory  Blood pressure 93/63, pulse 73,  temperature 97.3 F (36.3 C), temperature source Oral, resp. rate 18, height 5\' 10"  (1.778 m), weight 186 lb 11.2 oz (84.687 kg).  GENERAL:alert, no distress and comfortable; mildly obese with R sided port.  SKIN: skin color, texture, turgor are normal, no rashes or significant lesions; Left face scar status post melanoma resection without signs  Of recurrence. Upper lip with crusty area about the size of an eraser head that he reports has not healed.  EYES: normal, Conjunctiva are pink and some mild injection on the left, sclera clear OROPHARYNX:no exudate, no erythema and lips, buccal mucosa, and tongue normal  NECK: supple, thyroid normal size, non-tender, without nodularity LYMPH:  no palpable lymphadenopathy in the cervical, axillary or inguinal LUNGS: Decreased breath sounds bilaterally (L > R) with normal breathing effort HEART: regular rate & rhythm and no murmurs and no lower extremity edema ABDOMEN:abdomen soft, non-tender and normal bowel sounds Musculoskeletal:no cyanosis of digits and no clubbing  NEURO: alert & oriented x 3 with fluent speech, no focal motor/sensory deficits  CMP     Component Value Date/Time   NA 142 04/19/2013 1119   NA 142 11/11/2011 1127   K 5.0 04/19/2013 1119   K 4.6 11/11/2011 1127   CL 104 08/27/2012 1421   CL 105 11/11/2011  1127   CO2 27 04/19/2013 1119   CO2 27 11/11/2011 1127   GLUCOSE 93 04/19/2013 1119   GLUCOSE 72 08/27/2012 1421   GLUCOSE 81 11/11/2011 1127   BUN 19.1 04/19/2013 1119   BUN 14 11/11/2011 1127   CREATININE 0.9 04/19/2013 1119   CREATININE 0.91 11/11/2011 1127   CALCIUM 9.8 04/19/2013 1119   CALCIUM 9.5 11/11/2011 1127   PROT 7.5 04/19/2013 1119   PROT 6.9 11/11/2011 1127   ALBUMIN 3.7 04/19/2013 1119   ALBUMIN 4.0 11/11/2011 1127   AST 17 04/19/2013 1119   AST 18 11/11/2011 1127   ALT 19 04/19/2013 1119   ALT 10 11/11/2011 1127   ALKPHOS 218* 04/19/2013 1119   ALKPHOS 77 11/11/2011 1127   BILITOT 0.95 04/19/2013 1119   BILITOT 0.8 11/11/2011 1127    GFRNONAA 81* 08/19/2011 1437   GFRAA >90 08/19/2011 1437   RADIOGRAPHIC STUDIES: CT CHEST WITH CONTRAST (28-Sep-2012) Right-sided Port-A-Cath tip is positioned at the mid SVC. No axillary lymphadenopathy. Loculated pleural fluid in the upper left hemithorax is stable. The soft tissue attenuation in and around the left hilum is slightly progressed, measuring 3.4 x 6.6 cm today compared to 2.1 x 3.6 cm previously. There may be a component of increased collapse lung contributing to the more prominent appearance today. Loculated pleural fluid at the left base has increased slightly in the interval. Stable appearance of mild to moderate pericardial fluid. Lung windows show progression of airspace opacity in the central right lower lobe, tracking up into the right hilum. This is the area of new airspace disease seen on the previous study which is not become more confluent. Neoplasm in this location is not excluded. PET CT may prove helpful to further evaluate. Bone windows reveal no worrisome lytic or sclerotic osseous lesions. IMPRESSION: Interval progression of soft tissue attenuation in and around the left hilum may reflect evolving post treatment changes, but disease progression is a concern. Interval development of focal ill-defined opacity in the central right lower lobe infrahilar region. The patient had known squamous cell carcinoma in this region and underwent stereotactic radiation about a year ago. The previous CT scan was immediately after the completion of the radiation treatment and the appearance on today's CT scan may be related to interval development of fibrosis although disease progression cannot be excluded given the lack of intervening imaging. Follow-up or PET/CT may prove helpful to further assess.  ASSESSMENT: 1)Squamous cell carcinoma of the left lung [02/01/2009]. 2) Melanoma involving the left face [02/12/2013]. 3)  Squamous cell carcinoma in a right lower lobe nodule which was detected by  scheduled surveillance CT scan of the chest carried out on 07/21/2011  PLAN:  Squamous cell carcinoma in a right lower lobe nodule which was detected by scheduled surveillance CT scan of the chest carried out on 07/21/2011, confirmed by PET scan on 08/05/2011 and ultimately by transbronchial Wang needle biopsy from the right lower lobe on 08/27/2011. This lesion was likely a new primary lung cancer, Stage T1N0. The patient underwent stereotactic radiation beginning on 09/29/2011 through 10/08/2011. The patient received a total of 5 fractions, 10 Gray per fraction equaling 50 Gray to the right lower lobe lesion. He tolerated these treatments well.  CT scans reviewed and he will have repeat scans later this month for follow-up (ordered by Dr. Pablo Ledger).  Patient reminded to have these scans done.   Should the area in the right lower lobe be consistent with recurrent disease, he will not be a  candidate for further XRT or surgery so he will be offered palliative chemotherapy alone.  He understood this plan.   He had a skin lesion over his top lip that has been recurrent and non-healing.  We made a referral to dermatology for possible biopsy given his history of melanoma in the past.   All questions were answered. The patient knows to call the clinic with any problems, questions or concerns. We can certainly see the patient much sooner if necessary.  RTC in 4 months for labs including CBC, chemistries and CEA.   I spent 25 minutes counseling the patient face to face. The total time spent in the appointment was 40 minutes.    Marbeth Smedley, MD 04/21/2013 4:55 AM

## 2013-04-20 ENCOUNTER — Telehealth: Payer: Self-pay | Admitting: *Deleted

## 2013-04-20 LAB — CEA: CEA: 1 ng/mL (ref 0.0–5.0)

## 2013-04-20 NOTE — Telephone Encounter (Signed)
CALLED PATIENT TO LET HIM KNOW THAT HIS TEST AND FU VISIT HAVE BEEN RESCHEDULED, LVM FOR A RETURN CALL

## 2013-04-21 ENCOUNTER — Ambulatory Visit: Payer: Medicare Other | Admitting: Radiation Oncology

## 2013-04-22 ENCOUNTER — Other Ambulatory Visit: Payer: Self-pay

## 2013-04-22 MED ORDER — METOPROLOL SUCCINATE ER 25 MG PO TB24
25.0000 mg | ORAL_TABLET | Freq: Two times a day (BID) | ORAL | Status: DC
Start: 2013-04-22 — End: 2013-04-27

## 2013-04-25 ENCOUNTER — Telehealth: Payer: Self-pay

## 2013-04-26 NOTE — Telephone Encounter (Signed)
It should be metoprolol tartrate.

## 2013-04-27 ENCOUNTER — Telehealth: Payer: Self-pay

## 2013-04-27 ENCOUNTER — Other Ambulatory Visit: Payer: Self-pay

## 2013-04-27 MED ORDER — METOPROLOL TARTRATE 25 MG PO TABS: 25.0000 mg | ORAL_TABLET | Freq: Two times a day (BID) | ORAL | Status: DC

## 2013-04-27 NOTE — Telephone Encounter (Signed)
?   Incomplete note

## 2013-04-28 NOTE — Telephone Encounter (Signed)
Patient should be taking Metoprolol Tartrate 25 MG Tablet 1/2 tab bid

## 2013-05-02 ENCOUNTER — Encounter: Payer: Self-pay | Admitting: Radiation Oncology

## 2013-05-02 ENCOUNTER — Telehealth: Payer: Self-pay | Admitting: *Deleted

## 2013-05-02 ENCOUNTER — Other Ambulatory Visit: Payer: Self-pay

## 2013-05-02 MED ORDER — METOPROLOL TARTRATE 25 MG PO TABS: ORAL_TABLET | ORAL | Status: DC

## 2013-05-02 NOTE — Telephone Encounter (Signed)
CALLED PATIENT TO REMIND OF TEST FOR 05-03-13, LVM FOR A RETURN CALL

## 2013-05-03 ENCOUNTER — Ambulatory Visit (HOSPITAL_COMMUNITY)
Admission: RE | Admit: 2013-05-03 | Discharge: 2013-05-03 | Disposition: A | Discharge status: Home / Self Care | Payer: Medicare Other | Source: Ambulatory Visit | Attending: Radiation Oncology | Admitting: Radiation Oncology

## 2013-05-03 ENCOUNTER — Encounter (HOSPITAL_COMMUNITY): Payer: Self-pay

## 2013-05-03 DIAGNOSIS — I2510 Atherosclerotic heart disease of native coronary artery without angina pectoris: Secondary | ICD-10-CM | POA: Diagnosis not present

## 2013-05-03 DIAGNOSIS — I319 Disease of pericardium, unspecified: Secondary | ICD-10-CM | POA: Diagnosis not present

## 2013-05-03 DIAGNOSIS — K8020 Calculus of gallbladder without cholecystitis without obstruction: Secondary | ICD-10-CM | POA: Insufficient documentation

## 2013-05-03 DIAGNOSIS — Z9221 Personal history of antineoplastic chemotherapy: Secondary | ICD-10-CM | POA: Diagnosis not present

## 2013-05-03 DIAGNOSIS — Z923 Personal history of irradiation: Secondary | ICD-10-CM | POA: Diagnosis not present

## 2013-05-03 DIAGNOSIS — K7689 Other specified diseases of liver: Secondary | ICD-10-CM | POA: Insufficient documentation

## 2013-05-03 DIAGNOSIS — C3490 Malignant neoplasm of unspecified part of unspecified bronchus or lung: Secondary | ICD-10-CM | POA: Diagnosis not present

## 2013-05-03 DIAGNOSIS — I517 Cardiomegaly: Secondary | ICD-10-CM | POA: Insufficient documentation

## 2013-05-03 DIAGNOSIS — J438 Other emphysema: Secondary | ICD-10-CM | POA: Diagnosis not present

## 2013-05-03 DIAGNOSIS — N200 Calculus of kidney: Secondary | ICD-10-CM | POA: Diagnosis not present

## 2013-05-03 DIAGNOSIS — J90 Pleural effusion, not elsewhere classified: Secondary | ICD-10-CM | POA: Insufficient documentation

## 2013-05-03 DIAGNOSIS — I700 Atherosclerosis of aorta: Secondary | ICD-10-CM | POA: Insufficient documentation

## 2013-05-03 MED ORDER — IOHEXOL 300 MG/ML  SOLN
80.0000 mL | Freq: Once | INTRAMUSCULAR | Status: AC | PRN
  Administered 2013-05-03: 80 mL via INTRAVENOUS

## 2013-05-05 ENCOUNTER — Encounter: Payer: Self-pay | Admitting: Radiation Oncology

## 2013-05-05 ENCOUNTER — Ambulatory Visit
Admission: RE | Admit: 2013-05-05 | Discharge: 2013-05-05 | Disposition: A | Discharge status: Home / Self Care | Payer: Medicare Other | Source: Ambulatory Visit | Attending: Radiation Oncology | Admitting: Radiation Oncology

## 2013-05-05 VITALS — BP 128/72 | HR 66 | Temp 97.4°F | Ht 70.0 in | Wt 190.1 lb

## 2013-05-05 DIAGNOSIS — C342 Malignant neoplasm of middle lobe, bronchus or lung: Secondary | ICD-10-CM | POA: Diagnosis not present

## 2013-05-05 DIAGNOSIS — C3410 Malignant neoplasm of upper lobe, unspecified bronchus or lung: Secondary | ICD-10-CM

## 2013-05-05 HISTORY — DX: Personal history of antineoplastic chemotherapy: Z92.21

## 2013-05-05 NOTE — Progress Notes (Signed)
[name] Fields here for assessment s/p radiation to his right lung which completed in 2013.  He c/o feeling fatigued all the time and takes a one hour nap daily, but has difficulty sleeping at night,therefore, he takes Temazepam nightly.  He denis any pain,but reports SOB when he has to climb steps, and when he gets out of the car to ambulate he has to stop and hold onto objects to take a break.  O2 sat while sitting is 99% on RA.

## 2013-05-06 NOTE — Progress Notes (Signed)
Department of Radiation Oncology  Phone:  [phone] Fax:        [phone]   Name: [name] Fields MRN: [id number]  DOB: 06/23/1934  Date: 05/05/13  Follow Up Visit Note  Diagnosis: Stage III non-small cell lung cancer treated with concurrent chemoradiation completed 2010 followed by adjuvant chemotherapy. Most recently a T1N0 right lower lobe non-small cell lung cancer treated with SB RT 50 gray in 5 fractions completed 10/08/2011.  Interval since last radiation: 18 months  Interval History: [name] Fields presents today for routine followup.  He is feeling well and doing well. He is maintaining his weight. He has stable dyspnea on exertion and a stable chronic cough. He really he wishes to do more in terms of walking. He has difficulty climbing his basement stairs and has to sit on a chair at the top of the stairs each time he walks up and down them. He denies any chest pain. He denies any headaches bone pain or hemoptysis.  He is not on oxygen. He is caring for his sister who just had a stroke. He had a CT of the chest on January 27 which showed basically stability of all areas. The area in the left hilum was not enlarged which is what I brought him back in 3 months.  Allergies:  Allergies  Allergen Reactions  . Sertraline Hcl Other (See Comments)    unknown    Medications:  Current Outpatient Prescriptions  Medication Sig Dispense Refill  . atorvastatin (LIPITOR) 20 MG tablet TAKE ONE TABLET BY MOUTH ONE TIME DAILY  90 tablet  3  . buPROPion (WELLBUTRIN SR) 150 MG 12 hr tablet Take 150 mg by mouth daily.       . clopidogrel (PLAVIX) 75 MG tablet Take 1 tablet (75 mg total) by mouth daily. TO BE RESTARTED ON FRIDAY 08/29/11      . Linaclotide (LINZESS PO) Take 1 tablet by mouth daily.      . metoprolol tartrate (LOPRESSOR) 25 MG tablet Take 1/2 tablet twice a day  45 tablet  3  . pantoprazole (PROTONIX) 40 MG tablet TAKE 1 TABLET BY MOUTH TWICE DAILY  180 tablet  0  . Tamsulosin  HCl (FLOMAX) 0.4 MG CAPS Take 0.4 mg by mouth daily with supper.       . temazepam (RESTORIL) 30 MG capsule Take 30 mg by mouth at bedtime as needed for sleep.      Marland Kitchen Umeclidinium-Vilanterol (ANORO ELLIPTA) 62.5-25 MCG/INH AEPB Inhale 1 Act into the lungs 2 (two) times daily.      Marland Kitchen venlafaxine XR (EFFEXOR-XR) 150 MG 24 hr capsule Take by mouth daily.        No current facility-administered medications for this encounter.    Physical Exam:  Filed Vitals:   05/05/13 1544  BP: 128/72  Pulse: 66  Temp: 97.4 F (36.3 C)   he is a pleasant male in no distress sitting comfortably examining table. His pulse ox  IMPRESSION: [name] Fields is a 79 y.o. male status post radiation with concurrent chemotherapy to the left hilum as well as SBRT to a right lower lobe nodule one year ago  PLAN:  He is doing well. He'll see him back in 6 months with a scan. He is also been seen in medical oncology. Unfortunately not be present because we can do for his pulmonary function. He knows to contact me with any questions or concerns in the interim.   Thea Silversmith, MD

## 2013-05-17 DIAGNOSIS — H18419 Arcus senilis, unspecified eye: Secondary | ICD-10-CM | POA: Diagnosis not present

## 2013-05-17 DIAGNOSIS — H2510 Age-related nuclear cataract, unspecified eye: Secondary | ICD-10-CM | POA: Diagnosis not present

## 2013-07-18 ENCOUNTER — Encounter: Payer: Self-pay | Admitting: Internal Medicine

## 2013-07-18 ENCOUNTER — Ambulatory Visit (HOSPITAL_BASED_OUTPATIENT_CLINIC_OR_DEPARTMENT_OTHER): Payer: Medicare Other | Admitting: Internal Medicine

## 2013-07-18 ENCOUNTER — Other Ambulatory Visit (HOSPITAL_BASED_OUTPATIENT_CLINIC_OR_DEPARTMENT_OTHER): Payer: Medicare Other

## 2013-07-18 ENCOUNTER — Telehealth: Payer: Self-pay | Admitting: Internal Medicine

## 2013-07-18 VITALS — BP 131/67 | HR 69 | Temp 97.4°F | Resp 17 | Ht 70.0 in | Wt 183.6 lb

## 2013-07-18 DIAGNOSIS — Z85820 Personal history of malignant melanoma of skin: Secondary | ICD-10-CM | POA: Diagnosis not present

## 2013-07-18 DIAGNOSIS — C3410 Malignant neoplasm of upper lobe, unspecified bronchus or lung: Secondary | ICD-10-CM

## 2013-07-18 LAB — CBC WITH DIFFERENTIAL/PLATELET
BASO%: 0.9 % (ref 0.0–2.0)
Basophils Absolute: 0 10*3/uL (ref 0.0–0.1)
EOS%: 2.9 % (ref 0.0–7.0)
Eosinophils Absolute: 0.1 10*3/uL (ref 0.0–0.5)
HCT: 44.8 % (ref 38.4–49.9)
HGB: 14.6 g/dL (ref 13.0–17.1)
LYMPH#: 0.7 10*3/uL — AB (ref 0.9–3.3)
LYMPH%: 13.7 % — ABNORMAL LOW (ref 14.0–49.0)
MCH: 28.7 pg (ref 27.2–33.4)
MCHC: 32.5 g/dL (ref 32.0–36.0)
MCV: 88.4 fL (ref 79.3–98.0)
MONO#: 0.6 10*3/uL (ref 0.1–0.9)
MONO%: 11.6 % (ref 0.0–14.0)
NEUT#: 3.6 10*3/uL (ref 1.5–6.5)
NEUT%: 70.9 % (ref 39.0–75.0)
Platelets: 229 10*3/uL (ref 140–400)
RBC: 5.07 10*6/uL (ref 4.20–5.82)
RDW: 14.9 % — AB (ref 11.0–14.6)
WBC: 5.1 10*3/uL (ref 4.0–10.3)

## 2013-07-18 LAB — COMPREHENSIVE METABOLIC PANEL (CC13)
ALBUMIN: 3.6 g/dL (ref 3.5–5.0)
ALK PHOS: 193 U/L — AB (ref 40–150)
ALT: 23 U/L (ref 0–55)
AST: 25 U/L (ref 5–34)
Anion Gap: 8 mEq/L (ref 3–11)
BUN: 12 mg/dL (ref 7.0–26.0)
CALCIUM: 9.8 mg/dL (ref 8.4–10.4)
CHLORIDE: 106 meq/L (ref 98–109)
CO2: 28 mEq/L (ref 22–29)
Creatinine: 0.8 mg/dL (ref 0.7–1.3)
Glucose: 96 mg/dl (ref 70–140)
POTASSIUM: 5 meq/L (ref 3.5–5.1)
SODIUM: 142 meq/L (ref 136–145)
Total Bilirubin: 1.14 mg/dL (ref 0.20–1.20)
Total Protein: 7.3 g/dL (ref 6.4–8.3)

## 2013-07-18 LAB — CEA: CEA: 1.8 ng/mL (ref 0.0–5.0)

## 2013-07-18 MED ORDER — SODIUM CHLORIDE 0.9 % IJ SOLN
10.0000 mL | INTRAMUSCULAR | Status: DC | PRN
  Administered 2013-07-18: 10 mL via INTRAVENOUS
  Filled 2013-07-18: qty 10

## 2013-07-18 MED ORDER — HEPARIN SOD (PORK) LOCK FLUSH 100 UNIT/ML IV SOLN
500.0000 [IU] | Freq: Once | INTRAVENOUS | Status: AC
  Administered 2013-07-18: 500 [IU] via INTRAVENOUS
  Filled 2013-07-18: qty 5

## 2013-07-18 NOTE — Telephone Encounter (Signed)
Pt will ask PCP to make dermatology consult instead later, pt is busy right now

## 2013-07-18 NOTE — Telephone Encounter (Signed)
Gave pt appt for lab,md and flush until August 2015

## 2013-07-18 NOTE — Progress Notes (Signed)
[facility] OFFICE PROGRESS NOTE  [name], MD [address] [place]  [address]  DIAGNOSIS: MALIGNANT NEOPLASM UPPER LOBE BRONCHUS OR LUNG  MELANOMA, HX OF  Chief Complaint  Patient presents with  . MALIGNANT NEOPLASM UPPER LOBE BRONCHUS OR LUNG    CURRENT THERAPY:  Active surveillance  INTERVAL HISTORY: [name] Fields 79 y.o. male with a history of Melanoma and NSCLC is here for followup.  He was last seen by me on 01/13//2015.   He has his right port flushed q 8 weeks for the past 4 years.  He had his CT scan of chest in January 29 th that revealed no acute cardiopulmonary abnormalities, stable appearance of soft tissue attenuation around the left hilar region which may reflect post treatment changes, and consolidation/atelectasis within the right lower lobe is stable from previous exam and tiny subpleural nodule in the right upper lobe is new from previous exam. He was seen by Dr. Pablo Ledger of radiation oncology.   He also denies hospitalizations or emergency room visits.  His appetite is good and his weight is stable.  He is scheduled to see his PCP (Dr. Inda Merlin) in June.  He will obtain his next physical in January.  No chest pain or fevers or chills. Planning on moving from [place] this month.   MEDICAL HISTORY: Past Medical History  Diagnosis Date  . IHD (ischemic heart disease)     Remote MI in 2001 with stent to the LCX  . Hypertension   . Hyperlipidemia   . SOB (shortness of breath)     Chronic with known fibrosis  . Hypokinesia     MILD INFERIOR  . GERD (gastroesophageal reflux disease)   . Lactose intolerance   . Diverticulosis   . Hearing loss   . Pleural effusion, left   . Pericardial effusion July 2011    s/p pericardial window; last echo in August 2012 with minimal effusion  . Normal nuclear stress test 2009    EF 63%. No ischemia. 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  . Non-small cell carcinoma of lung     Dx in October of  2010  . Arthritis     knees  . Anxiety     takes wellbutrin daily  . Hx of radiation therapy 09/29/11;10/01/11;10/03/11;10/06/11;10/08/11;    RLLlung,50Gy/56fx - SBRT  . Status post chemotherapy 06/04/2009 -  09/06/2009     4 cycles of Cisplatin and Gemcitabine with Neulasta Support    ONCOLOGY HISTORY: 1. Squamous cell carcinoma of the left lung, status post FNA on 02/01/2009, stage T4 N2 M0, stage IIIB, involving left upper lobe and hilar region. The patient was treated with weekly carboplatin and Taxol in combination with radiation treatments, 6,600 cGy in 33 fractions, directed at the left upper lobe and left hilar region from 02/22/2009 through 04/12/2009. He then received consolidative chemotherapy with cisplatin and gemcitabine for 4 cycles from 06/04/2009 through 09/06/2009. He received Neulasta during those treatments. The patient is no longer on any treatment. His last PET scan was on 08/05/2011. His last CT scan of the chest was on 11/13/2011. His last 2D echocardiogram was on 11/12/2010.  2.Melanoma involving the left face, T3a, status post wide excision on 02/12/2009. Melanoma in situ involving the chest in February 2007.   3.Squamous cell carcinoma in a right lower lobe nodule which was detected by scheduled surveillance CT scan of the chest carried out on 07/21/2011, confirmed by PET scan on 08/05/2011 and ultimately by transbronchial Mina Marble  needle biopsy from the right lower lobe on 08/27/2011. This lesion is most likely a new primary lung cancer, Stage T1N0. The patient underwent stereotactic radiation beginning on 09/29/2011 through 10/08/2011. The patient received a total of 5 fractions, 10 Gray per fraction equaling 50 Gray to the right lower lobe lesion. He tolerated these treatments well.  INTERIM HISTORY: has MALIGNANT NEOPLASM UPPER LOBE BRONCHUS OR LUNG; HYPERLIPIDEMIA; COPD (chronic obstructive pulmonary disease) with emphysema; MELANOMA, HX OF; Pericardial effusion; CAD (coronary  artery disease); PVC's (premature ventricular contractions); Pulmonary nodule; Old myocardial infarction; and Skin lesion of face on his problem list.    ALLERGIES:  is allergic to sertraline hcl.  MEDICATIONS: has a current medication list which includes the following prescription(s): atorvastatin, bupropion, clopidogrel, linaclotide, metoprolol tartrate, pantoprazole, tamsulosin, temazepam, umeclidinium-vilanterol, and venlafaxine xr.  SURGICAL HISTORY:  Past Surgical History  Procedure Laterality Date  . Pericardial window  July 2011  . Cardiovascular stress test  05/2007    EF 63%  . Coronary stent placement  07/1999    STENT TO THE LEFT CIRCUMFLEX  . Cardiac catheterization  07/11/2003    EF 60%, REPEAT, WHICH SHOWED 30% PROXIMAL NARROWING IN THE RIGHT  CORONARY ARTERY  . Transthoracic echocardiogram  02/22/2010    EF 55-60%  . Hernia repair      as an adult- R inguinal     REVIEW OF SYSTEMS:   Constitutional: Denies fevers, chills or abnormal weight loss Eyes: Denies blurriness of vision Ears, nose, mouth, throat, and face: Denies mucositis or sore throat Respiratory: Denies cough, dyspnea or wheezes Cardiovascular: Denies palpitation, chest discomfort or lower extremity swelling Gastrointestinal:  Denies nausea, heartburn or change in bowel habits; + constipation tolerated with linaclotide Skin: Denies abnormal skin rashes Lymphatics: Denies new lymphadenopathy or easy bruising Neurological:Denies numbness, tingling or new weaknesses Behavioral/Psych: Mood is stable, no new changes  All other systems were reviewed with the patient and are negative.  PHYSICAL EXAMINATION: ECOG PERFORMANCE STATUS: 1 - Symptomatic but completely ambulatory  Blood pressure 131/67, pulse 69, temperature 97.4 F (36.3 C), temperature source Oral, resp. rate 17, height 5\' 10"  (1.778 m), weight 183 lb 9.6 oz (83.28 kg), SpO2 99.00%.  GENERAL:alert, no distress and comfortable; mildly obese with R  sided port.  SKIN: skin color, texture, turgor are normal, no rashes or significant lesions; Left face scar status post melanoma resection without signs  Of recurrence. Upper lip with crusty area about the size of an eraser head that he reports has not healed.  EYES: normal, Conjunctiva are pink and some mild injection on the left, sclera clear OROPHARYNX:no exudate, no erythema and lips, buccal mucosa, and tongue normal  NECK: supple, thyroid normal size, non-tender, without nodularity LYMPH:  no palpable lymphadenopathy in the cervical, axillary or inguinal LUNGS: Decreased breath sounds bilaterally (L > R) with normal breathing effort HEART: regular rate & rhythm and no murmurs and no lower extremity edema ABDOMEN:abdomen soft, non-tender and normal bowel sounds Musculoskeletal:no cyanosis of digits and no clubbing  NEURO: alert & oriented x 3 with fluent speech, no focal motor/sensory deficits  CMP     Component Value Date/Time   NA 142 04/19/2013 1119   NA 142 11/11/2011 1127   K 5.0 04/19/2013 1119   K 4.6 11/11/2011 1127   CL 104 08/27/2012 1421   CL 105 11/11/2011 1127   CO2 27 04/19/2013 1119   CO2 27 11/11/2011 1127   GLUCOSE 93 04/19/2013 1119   GLUCOSE 72 08/27/2012 1421  GLUCOSE 81 11/11/2011 1127   BUN 19.1 04/19/2013 1119   BUN 14 11/11/2011 1127   CREATININE 0.9 04/19/2013 1119   CREATININE 0.91 11/11/2011 1127   CALCIUM 9.8 04/19/2013 1119   CALCIUM 9.5 11/11/2011 1127   PROT 7.5 04/19/2013 1119   PROT 6.9 11/11/2011 1127   ALBUMIN 3.7 04/19/2013 1119   ALBUMIN 4.0 11/11/2011 1127   AST 17 04/19/2013 1119   AST 18 11/11/2011 1127   ALT 19 04/19/2013 1119   ALT 10 11/11/2011 1127   ALKPHOS 218* 04/19/2013 1119   ALKPHOS 77 11/11/2011 1127   BILITOT 0.95 04/19/2013 1119   BILITOT 0.8 11/11/2011 1127   GFRNONAA 81* 08/19/2011 1437   GFRAA >90 08/19/2011 1437   RADIOGRAPHIC STUDIES: CT CHEST WITH CONTRAST (10-11-2012) Right-sided Port-A-Cath tip is positioned at the mid SVC. No axillary  lymphadenopathy. Loculated pleural fluid in the upper left hemithorax is stable. The soft tissue attenuation in and around the left hilum is slightly progressed, measuring 3.4 x 6.6 cm today compared to 2.1 x 3.6 cm previously. There may be a component of increased collapse lung contributing to the more prominent appearance today. Loculated pleural fluid at the left base has increased slightly in the interval. Stable appearance of mild to moderate pericardial fluid. Lung windows show progression of airspace opacity in the central right lower lobe, tracking up into the right hilum. This is the area of new airspace disease seen on the previous study which is not become more confluent. Neoplasm in this location is not excluded. PET CT may prove helpful to further evaluate. Bone windows reveal no worrisome lytic or sclerotic osseous lesions. IMPRESSION: Interval progression of soft tissue attenuation in and around the left hilum may reflect evolving post treatment changes, but disease progression is a concern. Interval development of focal ill-defined opacity in the central right lower lobe infrahilar region. The patient had known squamous cell carcinoma in this region and underwent stereotactic radiation about a year ago. The previous CT scan was immediately after the completion of the radiation treatment and the appearance on today's CT scan may be related to interval development of fibrosis although disease progression cannot be excluded given the lack of intervening imaging. Follow-up or PET/CT may prove helpful to further assess.  ASSESSMENT: 1)Squamous cell carcinoma of the left lung [02/01/2009]. 2) Melanoma involving the left face (02/12/2013). 3)  Squamous cell carcinoma in a right lower lobe nodule which was detected by scheduled surveillance CT scan of the chest carried out on 07/21/2011  PLAN:  Squamous cell carcinoma in a right lower lobe nodule which was detected by scheduled surveillance CT scan of  the chest carried out on 07/21/2011, confirmed by PET scan on 08/05/2011 and ultimately by transbronchial Wang needle biopsy from the right lower lobe on 08/27/2011. This lesion was likely a new primary lung cancer, Stage T1N0. The patient underwent stereotactic radiation beginning on 09/29/2011 through 10/08/2011. The patient received a total of 5 fractions, 10 Gray per fraction equaling 50 Gray to the right lower lobe lesion. He tolerated these treatments well.  CT scans reviewed and he will have repeat scans in 5 months for follow-up (ordered by Dr. Pablo Ledger).  Patient reminded to have these scans done.   Should the area in the right lower lobe be consistent with recurrent disease, he will not be a candidate for further XRT or surgery so he will be offered palliative chemotherapy alone.  He understood this plan.   All questions were answered. The patient  knows to call the clinic with any problems, questions or concerns. We can certainly see the patient much sooner if necessary.  RTC in 4 months for labs including CBC, chemistries and CEA. Please flush his port-a-cath every 8 weeks.   I spent 15 minutes counseling the patient face to face. The total time spent in the appointment was 25 minutes.    Concha Norway, MD 07/18/2013 9:11 AM

## 2013-08-25 DIAGNOSIS — S0003XA Contusion of scalp, initial encounter: Secondary | ICD-10-CM | POA: Diagnosis not present

## 2013-08-25 DIAGNOSIS — IMO0002 Reserved for concepts with insufficient information to code with codable children: Secondary | ICD-10-CM | POA: Diagnosis not present

## 2013-08-25 DIAGNOSIS — S0993XA Unspecified injury of face, initial encounter: Secondary | ICD-10-CM | POA: Diagnosis not present

## 2013-08-25 DIAGNOSIS — R404 Transient alteration of awareness: Secondary | ICD-10-CM | POA: Diagnosis not present

## 2013-08-25 DIAGNOSIS — I10 Essential (primary) hypertension: Secondary | ICD-10-CM | POA: Diagnosis not present

## 2013-08-25 DIAGNOSIS — S0083XA Contusion of other part of head, initial encounter: Secondary | ICD-10-CM | POA: Diagnosis not present

## 2013-08-25 DIAGNOSIS — R42 Dizziness and giddiness: Secondary | ICD-10-CM | POA: Diagnosis not present

## 2013-08-25 DIAGNOSIS — I359 Nonrheumatic aortic valve disorder, unspecified: Secondary | ICD-10-CM | POA: Diagnosis not present

## 2013-08-25 DIAGNOSIS — Z85118 Personal history of other malignant neoplasm of bronchus and lung: Secondary | ICD-10-CM | POA: Diagnosis not present

## 2013-08-25 DIAGNOSIS — S0990XA Unspecified injury of head, initial encounter: Secondary | ICD-10-CM | POA: Diagnosis not present

## 2013-08-25 DIAGNOSIS — R269 Unspecified abnormalities of gait and mobility: Secondary | ICD-10-CM | POA: Diagnosis not present

## 2013-08-25 DIAGNOSIS — E782 Mixed hyperlipidemia: Secondary | ICD-10-CM | POA: Diagnosis not present

## 2013-08-25 DIAGNOSIS — R55 Syncope and collapse: Secondary | ICD-10-CM | POA: Diagnosis not present

## 2013-08-25 DIAGNOSIS — S1093XA Contusion of unspecified part of neck, initial encounter: Secondary | ICD-10-CM | POA: Diagnosis not present

## 2013-08-25 DIAGNOSIS — I059 Rheumatic mitral valve disease, unspecified: Secondary | ICD-10-CM | POA: Diagnosis not present

## 2013-08-25 DIAGNOSIS — I2510 Atherosclerotic heart disease of native coronary artery without angina pectoris: Secondary | ICD-10-CM | POA: Diagnosis not present

## 2013-08-25 DIAGNOSIS — S199XXA Unspecified injury of neck, initial encounter: Secondary | ICD-10-CM | POA: Diagnosis not present

## 2013-08-25 DIAGNOSIS — H8120 Vestibular neuronitis, unspecified ear: Secondary | ICD-10-CM | POA: Diagnosis not present

## 2013-08-25 DIAGNOSIS — I517 Cardiomegaly: Secondary | ICD-10-CM | POA: Diagnosis not present

## 2013-08-26 DIAGNOSIS — M25519 Pain in unspecified shoulder: Secondary | ICD-10-CM | POA: Diagnosis not present

## 2013-08-26 DIAGNOSIS — S4980XA Other specified injuries of shoulder and upper arm, unspecified arm, initial encounter: Secondary | ICD-10-CM | POA: Diagnosis not present

## 2013-08-26 DIAGNOSIS — S8990XA Unspecified injury of unspecified lower leg, initial encounter: Secondary | ICD-10-CM | POA: Diagnosis not present

## 2013-08-26 DIAGNOSIS — S46909A Unspecified injury of unspecified muscle, fascia and tendon at shoulder and upper arm level, unspecified arm, initial encounter: Secondary | ICD-10-CM | POA: Diagnosis not present

## 2013-08-26 DIAGNOSIS — I10 Essential (primary) hypertension: Secondary | ICD-10-CM | POA: Diagnosis not present

## 2013-08-26 DIAGNOSIS — R42 Dizziness and giddiness: Secondary | ICD-10-CM | POA: Diagnosis not present

## 2013-08-26 DIAGNOSIS — M25569 Pain in unspecified knee: Secondary | ICD-10-CM | POA: Diagnosis not present

## 2013-08-26 DIAGNOSIS — Z85118 Personal history of other malignant neoplasm of bronchus and lung: Secondary | ICD-10-CM | POA: Diagnosis not present

## 2013-08-26 DIAGNOSIS — W19XXXA Unspecified fall, initial encounter: Secondary | ICD-10-CM | POA: Diagnosis not present

## 2013-08-26 DIAGNOSIS — S0083XA Contusion of other part of head, initial encounter: Secondary | ICD-10-CM | POA: Diagnosis not present

## 2013-08-26 DIAGNOSIS — S99919A Unspecified injury of unspecified ankle, initial encounter: Secondary | ICD-10-CM | POA: Diagnosis not present

## 2013-08-26 DIAGNOSIS — H8120 Vestibular neuronitis, unspecified ear: Secondary | ICD-10-CM | POA: Diagnosis not present

## 2013-08-26 DIAGNOSIS — R55 Syncope and collapse: Secondary | ICD-10-CM | POA: Diagnosis not present

## 2013-08-26 DIAGNOSIS — E782 Mixed hyperlipidemia: Secondary | ICD-10-CM | POA: Diagnosis not present

## 2013-08-26 DIAGNOSIS — I2510 Atherosclerotic heart disease of native coronary artery without angina pectoris: Secondary | ICD-10-CM | POA: Diagnosis not present

## 2013-08-26 DIAGNOSIS — S99929A Unspecified injury of unspecified foot, initial encounter: Secondary | ICD-10-CM | POA: Diagnosis not present

## 2013-08-26 DIAGNOSIS — S0003XA Contusion of scalp, initial encounter: Secondary | ICD-10-CM | POA: Diagnosis not present

## 2013-08-27 DIAGNOSIS — I10 Essential (primary) hypertension: Secondary | ICD-10-CM | POA: Diagnosis not present

## 2013-08-27 DIAGNOSIS — R42 Dizziness and giddiness: Secondary | ICD-10-CM | POA: Diagnosis not present

## 2013-08-27 DIAGNOSIS — R55 Syncope and collapse: Secondary | ICD-10-CM | POA: Diagnosis not present

## 2013-08-27 DIAGNOSIS — E782 Mixed hyperlipidemia: Secondary | ICD-10-CM | POA: Diagnosis not present

## 2013-08-30 DIAGNOSIS — IMO0001 Reserved for inherently not codable concepts without codable children: Secondary | ICD-10-CM | POA: Diagnosis not present

## 2013-08-30 DIAGNOSIS — H8120 Vestibular neuronitis, unspecified ear: Secondary | ICD-10-CM | POA: Diagnosis not present

## 2013-08-30 DIAGNOSIS — R269 Unspecified abnormalities of gait and mobility: Secondary | ICD-10-CM | POA: Diagnosis not present

## 2013-08-30 DIAGNOSIS — R42 Dizziness and giddiness: Secondary | ICD-10-CM | POA: Diagnosis not present

## 2013-09-01 DIAGNOSIS — H8120 Vestibular neuronitis, unspecified ear: Secondary | ICD-10-CM | POA: Diagnosis not present

## 2013-09-01 DIAGNOSIS — R269 Unspecified abnormalities of gait and mobility: Secondary | ICD-10-CM | POA: Diagnosis not present

## 2013-09-01 DIAGNOSIS — IMO0001 Reserved for inherently not codable concepts without codable children: Secondary | ICD-10-CM | POA: Diagnosis not present

## 2013-09-01 DIAGNOSIS — R42 Dizziness and giddiness: Secondary | ICD-10-CM | POA: Diagnosis not present

## 2013-09-05 DIAGNOSIS — IMO0001 Reserved for inherently not codable concepts without codable children: Secondary | ICD-10-CM | POA: Diagnosis not present

## 2013-09-05 DIAGNOSIS — R42 Dizziness and giddiness: Secondary | ICD-10-CM | POA: Diagnosis not present

## 2013-09-05 DIAGNOSIS — R269 Unspecified abnormalities of gait and mobility: Secondary | ICD-10-CM | POA: Diagnosis not present

## 2013-09-05 DIAGNOSIS — H8120 Vestibular neuronitis, unspecified ear: Secondary | ICD-10-CM | POA: Diagnosis not present

## 2013-09-07 DIAGNOSIS — H908 Mixed conductive and sensorineural hearing loss, unspecified: Secondary | ICD-10-CM | POA: Diagnosis not present

## 2013-09-07 DIAGNOSIS — H8120 Vestibular neuronitis, unspecified ear: Secondary | ICD-10-CM | POA: Diagnosis not present

## 2013-09-07 DIAGNOSIS — H8110 Benign paroxysmal vertigo, unspecified ear: Secondary | ICD-10-CM | POA: Diagnosis not present

## 2013-09-07 DIAGNOSIS — H905 Unspecified sensorineural hearing loss: Secondary | ICD-10-CM | POA: Diagnosis not present

## 2013-09-12 ENCOUNTER — Ambulatory Visit (HOSPITAL_BASED_OUTPATIENT_CLINIC_OR_DEPARTMENT_OTHER): Payer: Medicare Other

## 2013-09-12 VITALS — BP 98/59 | HR 63 | Temp 96.9°F

## 2013-09-12 DIAGNOSIS — Z452 Encounter for adjustment and management of vascular access device: Secondary | ICD-10-CM

## 2013-09-12 DIAGNOSIS — C3430 Malignant neoplasm of lower lobe, unspecified bronchus or lung: Secondary | ICD-10-CM

## 2013-09-12 DIAGNOSIS — Z95828 Presence of other vascular implants and grafts: Secondary | ICD-10-CM

## 2013-09-12 MED ORDER — SODIUM CHLORIDE 0.9 % IJ SOLN
10.0000 mL | INTRAMUSCULAR | Status: DC | PRN
  Administered 2013-09-12: 10 mL via INTRAVENOUS
  Filled 2013-09-12: qty 10

## 2013-09-12 MED ORDER — HEPARIN SOD (PORK) LOCK FLUSH 100 UNIT/ML IV SOLN
500.0000 [IU] | Freq: Once | INTRAVENOUS | Status: AC
  Administered 2013-09-12: 500 [IU] via INTRAVENOUS
  Filled 2013-09-12: qty 5

## 2013-09-12 NOTE — Patient Instructions (Signed)

## 2013-09-13 DIAGNOSIS — L989 Disorder of the skin and subcutaneous tissue, unspecified: Secondary | ICD-10-CM | POA: Diagnosis not present

## 2013-09-13 DIAGNOSIS — K5900 Constipation, unspecified: Secondary | ICD-10-CM | POA: Diagnosis not present

## 2013-09-13 DIAGNOSIS — I2510 Atherosclerotic heart disease of native coronary artery without angina pectoris: Secondary | ICD-10-CM | POA: Diagnosis not present

## 2013-09-13 DIAGNOSIS — J449 Chronic obstructive pulmonary disease, unspecified: Secondary | ICD-10-CM | POA: Diagnosis not present

## 2013-09-13 DIAGNOSIS — E782 Mixed hyperlipidemia: Secondary | ICD-10-CM | POA: Diagnosis not present

## 2013-09-13 DIAGNOSIS — R0609 Other forms of dyspnea: Secondary | ICD-10-CM | POA: Diagnosis not present

## 2013-09-13 DIAGNOSIS — I10 Essential (primary) hypertension: Secondary | ICD-10-CM | POA: Diagnosis not present

## 2013-09-13 DIAGNOSIS — L570 Actinic keratosis: Secondary | ICD-10-CM | POA: Diagnosis not present

## 2013-10-05 DIAGNOSIS — D485 Neoplasm of uncertain behavior of skin: Secondary | ICD-10-CM | POA: Diagnosis not present

## 2013-10-05 DIAGNOSIS — L570 Actinic keratosis: Secondary | ICD-10-CM | POA: Diagnosis not present

## 2013-10-05 DIAGNOSIS — D239 Other benign neoplasm of skin, unspecified: Secondary | ICD-10-CM | POA: Diagnosis not present

## 2013-10-05 DIAGNOSIS — Z85820 Personal history of malignant melanoma of skin: Secondary | ICD-10-CM | POA: Diagnosis not present

## 2013-10-05 DIAGNOSIS — C44319 Basal cell carcinoma of skin of other parts of face: Secondary | ICD-10-CM | POA: Diagnosis not present

## 2013-10-05 DIAGNOSIS — L940 Localized scleroderma [morphea]: Secondary | ICD-10-CM | POA: Diagnosis not present

## 2013-10-06 DIAGNOSIS — R269 Unspecified abnormalities of gait and mobility: Secondary | ICD-10-CM | POA: Diagnosis not present

## 2013-10-06 DIAGNOSIS — R42 Dizziness and giddiness: Secondary | ICD-10-CM | POA: Diagnosis not present

## 2013-10-06 DIAGNOSIS — IMO0001 Reserved for inherently not codable concepts without codable children: Secondary | ICD-10-CM | POA: Diagnosis not present

## 2013-10-06 DIAGNOSIS — H8120 Vestibular neuronitis, unspecified ear: Secondary | ICD-10-CM | POA: Diagnosis not present

## 2013-10-10 DIAGNOSIS — IMO0001 Reserved for inherently not codable concepts without codable children: Secondary | ICD-10-CM | POA: Diagnosis not present

## 2013-10-10 DIAGNOSIS — R269 Unspecified abnormalities of gait and mobility: Secondary | ICD-10-CM | POA: Diagnosis not present

## 2013-10-10 DIAGNOSIS — R42 Dizziness and giddiness: Secondary | ICD-10-CM | POA: Diagnosis not present

## 2013-10-10 DIAGNOSIS — H8120 Vestibular neuronitis, unspecified ear: Secondary | ICD-10-CM | POA: Diagnosis not present

## 2013-10-11 ENCOUNTER — Other Ambulatory Visit: Payer: Self-pay | Admitting: *Deleted

## 2013-10-11 DIAGNOSIS — Z85820 Personal history of malignant melanoma of skin: Secondary | ICD-10-CM

## 2013-10-11 MED ORDER — PANTOPRAZOLE SODIUM 40 MG PO TBEC: DELAYED_RELEASE_TABLET | ORAL | Status: DC

## 2013-10-13 DIAGNOSIS — IMO0001 Reserved for inherently not codable concepts without codable children: Secondary | ICD-10-CM | POA: Diagnosis not present

## 2013-10-13 DIAGNOSIS — R42 Dizziness and giddiness: Secondary | ICD-10-CM | POA: Diagnosis not present

## 2013-10-13 DIAGNOSIS — H8120 Vestibular neuronitis, unspecified ear: Secondary | ICD-10-CM | POA: Diagnosis not present

## 2013-10-13 DIAGNOSIS — R269 Unspecified abnormalities of gait and mobility: Secondary | ICD-10-CM | POA: Diagnosis not present

## 2013-10-14 ENCOUNTER — Telehealth: Payer: Self-pay | Admitting: *Deleted

## 2013-10-14 ENCOUNTER — Other Ambulatory Visit: Payer: Self-pay

## 2013-10-14 DIAGNOSIS — C3410 Malignant neoplasm of upper lobe, unspecified bronchus or lung: Secondary | ICD-10-CM

## 2013-10-14 NOTE — Telephone Encounter (Signed)
CALLED PATIENT TO INFORM OF TEST AND FU VISIT, LVM FOR A RETURN CALL

## 2013-10-17 DIAGNOSIS — R42 Dizziness and giddiness: Secondary | ICD-10-CM | POA: Diagnosis not present

## 2013-10-17 DIAGNOSIS — R269 Unspecified abnormalities of gait and mobility: Secondary | ICD-10-CM | POA: Diagnosis not present

## 2013-10-17 DIAGNOSIS — H8120 Vestibular neuronitis, unspecified ear: Secondary | ICD-10-CM | POA: Diagnosis not present

## 2013-10-17 DIAGNOSIS — IMO0001 Reserved for inherently not codable concepts without codable children: Secondary | ICD-10-CM | POA: Diagnosis not present

## 2013-10-18 DIAGNOSIS — R42 Dizziness and giddiness: Secondary | ICD-10-CM | POA: Diagnosis not present

## 2013-10-18 DIAGNOSIS — H8120 Vestibular neuronitis, unspecified ear: Secondary | ICD-10-CM | POA: Diagnosis not present

## 2013-10-18 DIAGNOSIS — IMO0001 Reserved for inherently not codable concepts without codable children: Secondary | ICD-10-CM | POA: Diagnosis not present

## 2013-10-18 DIAGNOSIS — R269 Unspecified abnormalities of gait and mobility: Secondary | ICD-10-CM | POA: Diagnosis not present

## 2013-11-04 ENCOUNTER — Other Ambulatory Visit: Payer: Self-pay

## 2013-11-04 MED ORDER — CLOPIDOGREL BISULFATE 75 MG PO TABS: 75.0000 mg | ORAL_TABLET | Freq: Every day | ORAL | Status: DC

## 2013-11-07 ENCOUNTER — Ambulatory Visit (HOSPITAL_BASED_OUTPATIENT_CLINIC_OR_DEPARTMENT_OTHER): Payer: Medicare Other

## 2013-11-07 VITALS — BP 106/59 | HR 64 | Temp 97.7°F

## 2013-11-07 DIAGNOSIS — Z452 Encounter for adjustment and management of vascular access device: Secondary | ICD-10-CM | POA: Diagnosis not present

## 2013-11-07 DIAGNOSIS — C3430 Malignant neoplasm of lower lobe, unspecified bronchus or lung: Secondary | ICD-10-CM | POA: Diagnosis not present

## 2013-11-07 DIAGNOSIS — Z95828 Presence of other vascular implants and grafts: Secondary | ICD-10-CM

## 2013-11-07 MED ORDER — SODIUM CHLORIDE 0.9 % IJ SOLN
10.0000 mL | INTRAMUSCULAR | Status: DC | PRN
  Administered 2013-11-07: 10 mL via INTRAVENOUS
  Filled 2013-11-07: qty 10

## 2013-11-07 MED ORDER — HEPARIN SOD (PORK) LOCK FLUSH 100 UNIT/ML IV SOLN
500.0000 [IU] | Freq: Once | INTRAVENOUS | Status: AC
  Administered 2013-11-07: 500 [IU] via INTRAVENOUS
  Filled 2013-11-07: qty 5

## 2013-11-07 NOTE — Patient Instructions (Signed)

## 2013-11-08 ENCOUNTER — Ambulatory Visit
Admission: RE | Admit: 2013-11-08 | Discharge: 2013-11-08 | Disposition: A | Discharge status: Home / Self Care | Payer: Medicare Other | Source: Ambulatory Visit | Attending: Radiation Oncology | Admitting: Radiation Oncology

## 2013-11-08 ENCOUNTER — Ambulatory Visit (HOSPITAL_COMMUNITY)
Admission: RE | Admit: 2013-11-08 | Discharge: 2013-11-08 | Disposition: A | Discharge status: Home / Self Care | Payer: Medicare Other | Source: Ambulatory Visit | Attending: Radiation Oncology | Admitting: Radiation Oncology

## 2013-11-08 DIAGNOSIS — Z9221 Personal history of antineoplastic chemotherapy: Secondary | ICD-10-CM | POA: Insufficient documentation

## 2013-11-08 DIAGNOSIS — J984 Other disorders of lung: Secondary | ICD-10-CM | POA: Diagnosis not present

## 2013-11-08 DIAGNOSIS — I700 Atherosclerosis of aorta: Secondary | ICD-10-CM | POA: Diagnosis not present

## 2013-11-08 DIAGNOSIS — J90 Pleural effusion, not elsewhere classified: Secondary | ICD-10-CM | POA: Diagnosis not present

## 2013-11-08 DIAGNOSIS — R911 Solitary pulmonary nodule: Secondary | ICD-10-CM | POA: Diagnosis not present

## 2013-11-08 DIAGNOSIS — J438 Other emphysema: Secondary | ICD-10-CM | POA: Diagnosis not present

## 2013-11-08 DIAGNOSIS — K8020 Calculus of gallbladder without cholecystitis without obstruction: Secondary | ICD-10-CM | POA: Insufficient documentation

## 2013-11-08 DIAGNOSIS — C3410 Malignant neoplasm of upper lobe, unspecified bronchus or lung: Secondary | ICD-10-CM | POA: Diagnosis not present

## 2013-11-08 DIAGNOSIS — R0602 Shortness of breath: Secondary | ICD-10-CM | POA: Diagnosis not present

## 2013-11-08 DIAGNOSIS — Z923 Personal history of irradiation: Secondary | ICD-10-CM | POA: Diagnosis not present

## 2013-11-08 DIAGNOSIS — C3490 Malignant neoplasm of unspecified part of unspecified bronchus or lung: Secondary | ICD-10-CM | POA: Insufficient documentation

## 2013-11-08 DIAGNOSIS — I319 Disease of pericardium, unspecified: Secondary | ICD-10-CM | POA: Insufficient documentation

## 2013-11-08 DIAGNOSIS — I2510 Atherosclerotic heart disease of native coronary artery without angina pectoris: Secondary | ICD-10-CM | POA: Diagnosis not present

## 2013-11-08 LAB — BUN AND CREATININE (CC13)
BUN: 17.8 mg/dL (ref 7.0–26.0)
Creatinine: 0.9 mg/dL (ref 0.7–1.3)

## 2013-11-08 MED ORDER — IOHEXOL 300 MG/ML  SOLN
80.0000 mL | Freq: Once | INTRAMUSCULAR | Status: AC | PRN
  Administered 2013-11-08: 80 mL via INTRAVENOUS

## 2013-11-10 ENCOUNTER — Telehealth: Payer: Self-pay | Admitting: *Deleted

## 2013-11-10 ENCOUNTER — Ambulatory Visit
Admission: RE | Admit: 2013-11-10 | Discharge: 2013-11-10 | Disposition: A | Discharge status: Home / Self Care | Payer: Medicare Other | Source: Ambulatory Visit | Attending: Radiation Oncology | Admitting: Radiation Oncology

## 2013-11-10 ENCOUNTER — Telehealth: Payer: Self-pay | Admitting: Internal Medicine

## 2013-11-10 VITALS — BP 113/71 | HR 58 | Temp 97.6°F | Wt 191.7 lb

## 2013-11-10 DIAGNOSIS — C3410 Malignant neoplasm of upper lobe, unspecified bronchus or lung: Secondary | ICD-10-CM

## 2013-11-10 NOTE — Progress Notes (Signed)
Department of Radiation Oncology  Phone:  [phone] Fax:        [phone]   Name: [name] Fields MRN: [id number]  DOB: [birth year]  Date: 05/05/13  Follow Up Visit Note  Diagnosis: Stage III non-small cell lung cancer treated with concurrent chemoradiation completed 2010 followed by adjuvant chemotherapy. Most recently a T1N0 right lower lobe non-small cell lung cancer treated with SB RT 50 gray in 5 fractions completed 10/08/2011.  Interval since last radiation:  2 years  Interval History: [name] Fields presents today for routine followup. He was hospitalized recently after a fall and was diagnosed with vertigo.  His sister had knee replacement and a stroke and is recovering from that. His recent CT showed a decrease in size in the pleural effusion and stability of the left hilar mass with no new lesions.  His dyspnea is stable. No headaches or bone pain. He is sad Dr. Juliann Mule will be leaving.   Allergies:  Allergies  Allergen Reactions  . Sertraline Hcl Other (See Comments)    unknown    Medications:  Current Outpatient Prescriptions  Medication Sig Dispense Refill  . atorvastatin (LIPITOR) 20 MG tablet TAKE ONE TABLET BY MOUTH ONE TIME DAILY  90 tablet  3  . buPROPion (WELLBUTRIN SR) 150 MG 12 hr tablet Take 150 mg by mouth daily.       . clopidogrel (PLAVIX) 75 MG tablet Take 1 tablet (75 mg total) by mouth daily.  30 tablet  3  . Linaclotide (LINZESS PO) Take 1 tablet by mouth daily.      . metoprolol tartrate (LOPRESSOR) 25 MG tablet Take 1/2 tablet twice a day  45 tablet  3  . pantoprazole (PROTONIX) 40 MG tablet TAKE 1 TABLET BY MOUTH TWICE DAILY  180 tablet  0  . Tamsulosin HCl (FLOMAX) 0.4 MG CAPS Take 0.4 mg by mouth daily with supper.       . temazepam (RESTORIL) 30 MG capsule Take 30 mg by mouth at bedtime as needed for sleep.      Marland Kitchen venlafaxine XR (EFFEXOR-XR) 150 MG 24 hr capsule Take by mouth daily.       Marland Kitchen Umeclidinium-Vilanterol (ANORO ELLIPTA) 62.5-25 MCG/INH  AEPB Inhale 1 Act into the lungs 2 (two) times daily.       No current facility-administered medications for this encounter.    Physical Exam:  Filed Vitals:   11/10/13 1133  BP: 113/71  Pulse: 58  Temp: 97.6 F (36.4 C)   he is a pleasant male in no distress sitting comfortably examining table. His pulse ox  IMPRESSION: [name] Fields is a 79 y.o. male status post radiation with concurrent chemotherapy to the left hilum as well as SBRT to a right lower lobe nodule 2 years ago  PLAN:  He is doing well. We will see him back in 6 months with a scan.  I told him I was happy to follow him without medical oncology if needed.   Thea Silversmith, MD

## 2013-11-10 NOTE — Telephone Encounter (Signed)
CALLED PATIENT TO INFORM OF CT FOR 05-15-14- ARRIVAL TIME- 9:45 AM @ [facility]

## 2013-11-10 NOTE — Telephone Encounter (Signed)
shirley from Radonc called to cx per Dr. Pablo Ledger

## 2013-11-14 DIAGNOSIS — H16229 Keratoconjunctivitis sicca, not specified as Sjogren's, unspecified eye: Secondary | ICD-10-CM | POA: Diagnosis not present

## 2013-11-21 ENCOUNTER — Ambulatory Visit: Payer: Medicare Other

## 2013-11-21 ENCOUNTER — Other Ambulatory Visit: Payer: Medicare Other

## 2013-12-02 ENCOUNTER — Ambulatory Visit (HOSPITAL_COMMUNITY): Payer: Medicare Other

## 2013-12-08 DIAGNOSIS — C44319 Basal cell carcinoma of skin of other parts of face: Secondary | ICD-10-CM | POA: Diagnosis not present

## 2014-01-04 ENCOUNTER — Other Ambulatory Visit: Payer: Self-pay | Admitting: Internal Medicine

## 2014-01-06 ENCOUNTER — Other Ambulatory Visit: Payer: Self-pay | Admitting: *Deleted

## 2014-01-06 DIAGNOSIS — Z85820 Personal history of malignant melanoma of skin: Secondary | ICD-10-CM

## 2014-01-06 MED ORDER — PANTOPRAZOLE SODIUM 40 MG PO TBEC: DELAYED_RELEASE_TABLET | ORAL | Status: DC

## 2014-01-12 ENCOUNTER — Telehealth: Payer: Self-pay | Admitting: Internal Medicine

## 2014-01-12 ENCOUNTER — Ambulatory Visit (HOSPITAL_BASED_OUTPATIENT_CLINIC_OR_DEPARTMENT_OTHER): Payer: Medicare Other

## 2014-01-12 VITALS — BP 119/67 | HR 58 | Temp 97.6°F

## 2014-01-12 DIAGNOSIS — Z452 Encounter for adjustment and management of vascular access device: Secondary | ICD-10-CM | POA: Diagnosis not present

## 2014-01-12 DIAGNOSIS — Z95828 Presence of other vascular implants and grafts: Secondary | ICD-10-CM

## 2014-01-12 DIAGNOSIS — C3412 Malignant neoplasm of upper lobe, left bronchus or lung: Secondary | ICD-10-CM

## 2014-01-12 MED ORDER — HEPARIN SOD (PORK) LOCK FLUSH 100 UNIT/ML IV SOLN
500.0000 [IU] | Freq: Once | INTRAVENOUS | Status: AC
  Administered 2014-01-12: 500 [IU] via INTRAVENOUS
  Filled 2014-01-12: qty 5

## 2014-01-12 MED ORDER — SODIUM CHLORIDE 0.9 % IJ SOLN
10.0000 mL | INTRAMUSCULAR | Status: DC | PRN
  Administered 2014-01-12: 10 mL via INTRAVENOUS
  Filled 2014-01-12: qty 10

## 2014-01-12 NOTE — Telephone Encounter (Signed)
gv and printed appt sched and avs for pt for Dec and Jan

## 2014-01-12 NOTE — Patient Instructions (Signed)

## 2014-02-06 ENCOUNTER — Other Ambulatory Visit: Payer: Self-pay | Admitting: Hematology

## 2014-02-07 ENCOUNTER — Encounter: Payer: Self-pay | Admitting: Cardiology

## 2014-02-07 ENCOUNTER — Ambulatory Visit (INDEPENDENT_AMBULATORY_CARE_PROVIDER_SITE_OTHER): Payer: Medicare Other | Admitting: Cardiology

## 2014-02-07 VITALS — BP 106/70 | HR 62 | Ht 70.0 in | Wt 190.8 lb

## 2014-02-07 DIAGNOSIS — I252 Old myocardial infarction: Secondary | ICD-10-CM | POA: Diagnosis not present

## 2014-02-07 DIAGNOSIS — J438 Other emphysema: Secondary | ICD-10-CM

## 2014-02-07 DIAGNOSIS — E785 Hyperlipidemia, unspecified: Secondary | ICD-10-CM

## 2014-02-07 DIAGNOSIS — I2583 Coronary atherosclerosis due to lipid rich plaque: Secondary | ICD-10-CM

## 2014-02-07 DIAGNOSIS — I2510 Atherosclerotic heart disease of native coronary artery without angina pectoris: Secondary | ICD-10-CM

## 2014-02-07 NOTE — Progress Notes (Signed)
[name] Fields. [address], [place]  [address] Phone: [phone] Fax:  [phone]  Date:  02/07/2014   ID:  [name] Fields, DOB [birth year], MRN [id number]  PCP:  [name] Screws, MD   History of Present Illness: [name] Fields is a 79 y.o. male with coronary artery disease status post multiple stents in the past, former patient of [name] Fields here for followup. Done with radiation tx for lung cancer. CT scan 01/03/13. 6 month CT interval.  In April of 2001, lateral wall MI, circumflex stent with brachii therapy and angioplasty of stent in 2001. In 2011 underwent a pericardial window because of pericardial effusion. He walks 6 days a week 20min a day. COPD, [name] Fields.   LDL 63 on 03/23/12, creatinine 0.92.  Vertigo, fell, hit head. 16 weeks of therapy.    Wt Readings from Last 3 Encounters:  02/07/14 190 lb 12.8 oz (86.546 kg)  11/10/13 191 lb 11.2 oz (86.955 kg)  07/18/13 183 lb 9.6 oz (83.28 kg)     Past Medical History  Diagnosis Date  . IHD (ischemic heart disease)     Remote MI in 2001 with stent to the LCX  . Hypertension   . Hyperlipidemia   . SOB (shortness of breath)     Chronic with known fibrosis  . Hypokinesia     MILD INFERIOR  . GERD (gastroesophageal reflux disease)   . Lactose intolerance   . Diverticulosis   . Hearing loss   . Pleural effusion, left   . Pericardial effusion July 2011    s/p pericardial window; last echo in August 2012 with minimal effusion  . Normal nuclear stress test 2009    EF 63%. No ischemia. 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  . Non-small cell carcinoma of lung     Dx in October of 2010  . Arthritis     knees  . Anxiety     takes wellbutrin daily  . Hx of radiation therapy 09/29/11;10/01/11;10/03/11;10/06/11;10/08/11;    RLLlung,50Gy/62fx - SBRT  . Status post chemotherapy 06/04/2009 -  09/06/2009     4 cycles of Cisplatin and Gemcitabine with Neulasta Support     Past Surgical History  Procedure Laterality Date    . Pericardial window  July 2011  . Cardiovascular stress test  05/2007    EF 63%  . Coronary stent placement  07/1999    STENT TO THE LEFT CIRCUMFLEX  . Cardiac catheterization  07/11/2003    EF 60%, REPEAT, WHICH SHOWED 30% PROXIMAL NARROWING IN THE RIGHT  CORONARY ARTERY  . Transthoracic echocardiogram  02/22/2010    EF 55-60%  . Hernia repair      as an adult- R inguinal     Current Outpatient Prescriptions  Medication Sig Dispense Refill  . atorvastatin (LIPITOR) 20 MG tablet TAKE ONE TABLET BY MOUTH ONE TIME DAILY 90 tablet 3  . buPROPion (WELLBUTRIN SR) 150 MG 12 hr tablet Take 150 mg by mouth daily.     . clopidogrel (PLAVIX) 75 MG tablet Take 1 tablet (75 mg total) by mouth daily. 30 tablet 3  . Linaclotide (LINZESS PO) Take 1 tablet by mouth daily.    . meclizine (ANTIVERT) 12.5 MG tablet Take 12.5 mg by mouth as needed for dizziness.    . metoprolol tartrate (LOPRESSOR) 25 MG tablet Take 1/2 tablet twice a day 45 tablet 3  . pantoprazole (PROTONIX) 40 MG tablet TAKE 1 TABLET BY MOUTH TWICE DAILY 60  tablet 0  . Tamsulosin HCl (FLOMAX) 0.4 MG CAPS Take 0.4 mg by mouth daily with supper.     . temazepam (RESTORIL) 30 MG capsule Take 30 mg by mouth at bedtime as needed for sleep.    Marland Kitchen Umeclidinium-Vilanterol (ANORO ELLIPTA) 62.5-25 MCG/INH AEPB Inhale 1 Act into the lungs 2 (two) times daily.    Marland Kitchen venlafaxine XR (EFFEXOR-XR) 150 MG 24 hr capsule Take by mouth daily.      No current facility-administered medications for this visit.    Allergies:    Allergies  Allergen Reactions  . Sertraline Hcl Other (See Comments)    unknown    Social History:  The patient  reports that he quit smoking about 19 years ago. His smoking use included Cigarettes. He has a 120 pack-year smoking history. He has never used smokeless tobacco. He reports that he does not drink alcohol or use illicit drugs.   ROS:  Please see the history of present illness.   Denies any syncope, bleeding, orthopnea,  PND    PHYSICAL EXAM: VS:  BP 106/70 mmHg  Pulse 62  Ht 5\' 10"  (1.778 m)  Wt 190 lb 12.8 oz (86.546 kg)  BMI 27.38 kg/m2 Well nourished, well developed, in no acute distress HEENT: Multiple excoriations on nose Neck: no JVD Cardiac:  normal S1, S2; RRR; no murmur Lungs:   + wheezing,no  rhonchi or rales Abd: soft, nontender, no hepatomegaly Ext: no edema Skin: warm and dry Neuro: no focal abnormalities noted  Nuclear stress test 07/19/12-basal to mid lateral wall fixed defect consistent with infarct. No ischemia, EF 58%.  EKG: 02/24/14-demonstrates sinus rhythm, right bundle branch block, first degree AV block, PR interval 247ms. Inferior infarct patternNo significant change from prior  ASSESSMENT AND PLAN:  1. Lung cancer-currently maxed out on radiation therapy. Recent CT scan 9/14. Been monitored closely by oncology/radiation oncology. Used to see Dr. Weber Fields.  2. Coronary artery disease-no active anginal symptoms. Exercising well.  3. Prior PCI's-no recent anginal symptoms. 4. Hyperlipidemia-continue with statin therapy. If Dr. Inda Fields does not check during physical, I will be glad to check at next visit. 5. Former tobacco use 6. COPD-[name] Fields, I do appreciate some active wheezing. Continue with inhalers 7. 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 I told him to contact me if any further cardiovascular needs are needed in the meantime, I will see him back in 6 months.  Signed, [name] Furbish, MD [facility]  02/07/2014 11:19 AM

## 2014-02-07 NOTE — Patient Instructions (Signed)
The current medical regimen is effective;  continue present plan and medications.  Follow up in 6 months with Dr. Marlou Porch.  You will receive a letter in the mail 2 months before you are due.  Please call us when you receive this letter to schedule your follow up appointment.

## 2014-02-17 ENCOUNTER — Other Ambulatory Visit: Payer: Self-pay | Admitting: Internal Medicine

## 2014-03-09 ENCOUNTER — Ambulatory Visit (HOSPITAL_BASED_OUTPATIENT_CLINIC_OR_DEPARTMENT_OTHER): Payer: Medicare Other

## 2014-03-09 DIAGNOSIS — Z95828 Presence of other vascular implants and grafts: Secondary | ICD-10-CM

## 2014-03-09 DIAGNOSIS — Z452 Encounter for adjustment and management of vascular access device: Secondary | ICD-10-CM

## 2014-03-09 DIAGNOSIS — C3412 Malignant neoplasm of upper lobe, left bronchus or lung: Secondary | ICD-10-CM | POA: Diagnosis not present

## 2014-03-09 MED ORDER — SODIUM CHLORIDE 0.9 % IJ SOLN
10.0000 mL | INTRAMUSCULAR | Status: DC | PRN
  Administered 2014-03-09: 10 mL via INTRAVENOUS
  Filled 2014-03-09: qty 10

## 2014-03-09 MED ORDER — HEPARIN SOD (PORK) LOCK FLUSH 100 UNIT/ML IV SOLN
500.0000 [IU] | Freq: Once | INTRAVENOUS | Status: AC
  Administered 2014-03-09: 500 [IU] via INTRAVENOUS
  Filled 2014-03-09: qty 5

## 2014-03-09 NOTE — Patient Instructions (Signed)

## 2014-03-21 DIAGNOSIS — I2510 Atherosclerotic heart disease of native coronary artery without angina pectoris: Secondary | ICD-10-CM | POA: Diagnosis not present

## 2014-03-21 DIAGNOSIS — Z0001 Encounter for general adult medical examination with abnormal findings: Secondary | ICD-10-CM | POA: Diagnosis not present

## 2014-03-21 DIAGNOSIS — C3430 Malignant neoplasm of lower lobe, unspecified bronchus or lung: Secondary | ICD-10-CM | POA: Diagnosis not present

## 2014-03-21 DIAGNOSIS — I10 Essential (primary) hypertension: Secondary | ICD-10-CM | POA: Diagnosis not present

## 2014-03-21 DIAGNOSIS — E780 Pure hypercholesterolemia: Secondary | ICD-10-CM | POA: Diagnosis not present

## 2014-03-21 DIAGNOSIS — E559 Vitamin D deficiency, unspecified: Secondary | ICD-10-CM | POA: Diagnosis not present

## 2014-03-21 DIAGNOSIS — Z23 Encounter for immunization: Secondary | ICD-10-CM | POA: Diagnosis not present

## 2014-03-21 DIAGNOSIS — M7580 Other shoulder lesions, unspecified shoulder: Secondary | ICD-10-CM | POA: Diagnosis not present

## 2014-03-21 DIAGNOSIS — Z1389 Encounter for screening for other disorder: Secondary | ICD-10-CM | POA: Diagnosis not present

## 2014-03-21 DIAGNOSIS — Z125 Encounter for screening for malignant neoplasm of prostate: Secondary | ICD-10-CM | POA: Diagnosis not present

## 2014-04-17 ENCOUNTER — Other Ambulatory Visit: Payer: Self-pay | Admitting: Cardiology

## 2014-05-04 ENCOUNTER — Telehealth: Payer: Self-pay | Admitting: Internal Medicine

## 2014-05-04 DIAGNOSIS — C3410 Malignant neoplasm of upper lobe, unspecified bronchus or lung: Secondary | ICD-10-CM

## 2014-05-11 ENCOUNTER — Telehealth: Payer: Self-pay | Admitting: *Deleted

## 2014-05-11 NOTE — Telephone Encounter (Signed)
Called patient to inform of STAT Labs on 05-15-14 @ 9 am @ [facility], spoke with patient and he is aware of this appt.

## 2014-05-15 ENCOUNTER — Ambulatory Visit (HOSPITAL_COMMUNITY)
Admission: RE | Admit: 2014-05-15 | Discharge: 2014-05-15 | Disposition: A | Discharge status: Home / Self Care | Payer: Medicare Other | Source: Ambulatory Visit | Attending: Radiation Oncology | Admitting: Radiation Oncology

## 2014-05-15 ENCOUNTER — Encounter (HOSPITAL_COMMUNITY): Payer: Self-pay

## 2014-05-15 ENCOUNTER — Ambulatory Visit
Admission: RE | Admit: 2014-05-15 | Discharge: 2014-05-15 | Disposition: A | Discharge status: Home / Self Care | Payer: Medicare Other | Source: Ambulatory Visit | Attending: Radiation Oncology | Admitting: Radiation Oncology

## 2014-05-15 DIAGNOSIS — R0602 Shortness of breath: Secondary | ICD-10-CM | POA: Insufficient documentation

## 2014-05-15 DIAGNOSIS — C3410 Malignant neoplasm of upper lobe, unspecified bronchus or lung: Secondary | ICD-10-CM | POA: Insufficient documentation

## 2014-05-15 DIAGNOSIS — Z85118 Personal history of other malignant neoplasm of bronchus and lung: Secondary | ICD-10-CM | POA: Diagnosis not present

## 2014-05-15 DIAGNOSIS — Z9221 Personal history of antineoplastic chemotherapy: Secondary | ICD-10-CM | POA: Insufficient documentation

## 2014-05-15 DIAGNOSIS — Z08 Encounter for follow-up examination after completed treatment for malignant neoplasm: Secondary | ICD-10-CM | POA: Diagnosis not present

## 2014-05-15 LAB — BUN AND CREATININE (CC13)
BUN: 15.6 mg/dL (ref 7.0–26.0)
CREATININE: 0.9 mg/dL (ref 0.7–1.3)
EGFR: 79 mL/min/{1.73_m2} — AB (ref >90)

## 2014-05-15 MED ORDER — IOHEXOL 300 MG/ML  SOLN
80.0000 mL | Freq: Once | INTRAMUSCULAR | Status: AC | PRN
  Administered 2014-05-15: 80 mL via INTRAVENOUS

## 2014-05-18 ENCOUNTER — Telehealth: Payer: Self-pay | Admitting: *Deleted

## 2014-05-18 ENCOUNTER — Ambulatory Visit
Admission: RE | Admit: 2014-05-18 | Discharge: 2014-05-18 | Disposition: A | Discharge status: Home / Self Care | Payer: Medicare Other | Source: Ambulatory Visit | Attending: Radiation Oncology | Admitting: Radiation Oncology

## 2014-05-18 ENCOUNTER — Other Ambulatory Visit: Payer: Self-pay | Admitting: Hematology & Oncology

## 2014-05-18 ENCOUNTER — Telehealth: Payer: Self-pay | Admitting: Hematology & Oncology

## 2014-05-18 VITALS — BP 139/76 | HR 57 | Temp 97.7°F | Resp 18 | Wt 193.6 lb

## 2014-05-18 DIAGNOSIS — C3431 Malignant neoplasm of lower lobe, right bronchus or lung: Secondary | ICD-10-CM

## 2014-05-18 DIAGNOSIS — C342 Malignant neoplasm of middle lobe, bronchus or lung: Secondary | ICD-10-CM | POA: Diagnosis not present

## 2014-05-18 NOTE — Telephone Encounter (Signed)
CALLED PATIENT TO INFORM OF APPT. WITH DR. Marin Olp ON 06-15-14 - ARRIVAL TIME - 1:30 PM, LVM  FOR A RETURN CALL

## 2014-05-18 NOTE — Addendum Note (Signed)
Encounter addended by: Thea Silversmith, MD on: 05/18/2014  2:27 PM<BR>     Documentation filed: [employer], Orders

## 2014-05-18 NOTE — Telephone Encounter (Signed)
Left pt message to call for appointment, He is not aware of 3-10 appointment

## 2014-05-18 NOTE — Progress Notes (Signed)
Department of Radiation Oncology  Phone:  [phone] Fax:        [phone]   Name: [name] Fields MRN: [id number]  DOB: 1935-02-07  Date: 05/18/14  Follow Up Visit Note  Diagnosis: Stage III non-small cell lung cancer treated with concurrent chemoradiation completed 2010 followed by adjuvant chemotherapy. Most recently a T1N0 right lower lobe non-small cell lung cancer treated with SB RT 50 gray in 5 fractions completed 10/08/2011.  Interval since last radiation:  Almost 3 years  Interval History: [name] Fields presents today for routine followup. He is feeling well. 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 His sister also lives in [place]. His breathing is stable (not as good as he wishes it was). We ran into some difficulties in getting his port flushed as he was not being followed by medical oncology and the nurses in infusion refused to flush his port if he was not followed by a medical oncologist.  After Dr. Ralene Ok left and then Dr. Lorelle Formosa he asked if I could just follow him which was fine.  He has had his PAC in since 2011. I'm not sure why it is still in. Dr. Ralene Ok never discussed removing it, he says. He had a scan earlier this week that shows no new or progressive disease.   Allergies:  Allergies  Allergen Reactions  . Sertraline Hcl Other (See Comments)    unknown    Medications:  Current Outpatient Prescriptions  Medication Sig Dispense Refill  . atorvastatin (LIPITOR) 20 MG tablet TAKE 1 TABLET BY MOUTH DAILY 90 tablet 1  . buPROPion (WELLBUTRIN SR) 150 MG 12 hr tablet Take 150 mg by mouth daily.     . clopidogrel (PLAVIX) 75 MG tablet TAKE 1 TABLET BY MOUTH EVERY DAY 30 tablet 6  . Linaclotide (LINZESS PO) Take 1 tablet by mouth daily.    . meclizine (ANTIVERT) 12.5 MG tablet Take 12.5 mg by mouth as needed for dizziness.    . metoprolol tartrate (LOPRESSOR) 25 MG tablet Take 1/2 tablet twice a day 45 tablet 3  . pantoprazole (PROTONIX) 40 MG tablet  TAKE 1 TABLET BY MOUTH TWICE DAILY 60 tablet 0  . Tamsulosin HCl (FLOMAX) 0.4 MG CAPS Take 0.4 mg by mouth daily with supper.     . temazepam (RESTORIL) 30 MG capsule Take 30 mg by mouth at bedtime as needed for sleep.    Marland Kitchen Umeclidinium-Vilanterol (ANORO ELLIPTA) 62.5-25 MCG/INH AEPB Inhale 1 Act into the lungs 2 (two) times daily.    Marland Kitchen venlafaxine XR (EFFEXOR-XR) 150 MG 24 hr capsule Take by mouth daily.      No current facility-administered medications for this encounter.    Physical Exam:  Filed Vitals:   05/18/14 1254  BP: 139/76  Pulse: 57  Temp: 97.7 F (36.5 C)  Resp: 18   he is a pleasant male in no distress sitting comfortably examining table.  IMPRESSION: [name] Fields is a 80 y.o. male status post radiation with concurrent chemotherapy to the left hilum as well as SBRT to a right lower lobe nodule 3 years ago  PLAN:  He is doing well. We will see him back in 6 months with a scan.    I have referred him to Dr. Marin Olp who is closer to him to discuss removing the portacath or scheduling flushes.    Thea Silversmith, MD

## 2014-05-18 NOTE — Telephone Encounter (Signed)
Called patient to inform of scan for 11-21-14- arrival time - 1:45 pm @ [facility], spoke with patient and he is aware of this scan

## 2014-05-18 NOTE — Progress Notes (Signed)
Patient for follow up of radiation SBRT 50 gray in 5 fractions to right lower lobe lung completed on 10/08/2011  for non-small call lung cancer.Ct of chest performed on 05/15/14 reveals no evidence of recurrent/metastatic disease to chest.denies pain.shortness of breath on exertion.

## 2014-05-22 ENCOUNTER — Telehealth: Payer: Self-pay | Admitting: Hematology & Oncology

## 2014-05-22 NOTE — Telephone Encounter (Signed)
Pt aware of 3-10 appointments

## 2014-06-08 ENCOUNTER — Telehealth: Payer: Self-pay | Admitting: Hematology & Oncology

## 2014-06-08 ENCOUNTER — Telehealth: Payer: Self-pay | Admitting: *Deleted

## 2014-06-08 NOTE — Telephone Encounter (Signed)
Called patient to inform of appt. For  07-06-14 - 1:30 pm for labs and 2:00 pm visit with Dr. Marin Olp, spoke with patient and he is aware of these appts.

## 2014-06-08 NOTE — Telephone Encounter (Signed)
Rn Enid Derry from [place] called and cx 06/15/14 apt due to patient's sister having surgery.  Apt was cx and resch for 07/06/14.

## 2014-06-15 ENCOUNTER — Other Ambulatory Visit: Payer: Medicare Other | Admitting: Lab

## 2014-06-15 ENCOUNTER — Ambulatory Visit: Payer: Medicare Other | Admitting: Hematology & Oncology

## 2014-06-20 DIAGNOSIS — H2511 Age-related nuclear cataract, right eye: Secondary | ICD-10-CM | POA: Diagnosis not present

## 2014-06-20 DIAGNOSIS — H18412 Arcus senilis, left eye: Secondary | ICD-10-CM | POA: Diagnosis not present

## 2014-06-20 DIAGNOSIS — H18411 Arcus senilis, right eye: Secondary | ICD-10-CM | POA: Diagnosis not present

## 2014-06-20 DIAGNOSIS — H25041 Posterior subcapsular polar age-related cataract, right eye: Secondary | ICD-10-CM | POA: Diagnosis not present

## 2014-06-20 DIAGNOSIS — H25011 Cortical age-related cataract, right eye: Secondary | ICD-10-CM | POA: Diagnosis not present

## 2014-06-20 DIAGNOSIS — H01003 Unspecified blepharitis right eye, unspecified eyelid: Secondary | ICD-10-CM | POA: Diagnosis not present

## 2014-06-29 ENCOUNTER — Ambulatory Visit (HOSPITAL_BASED_OUTPATIENT_CLINIC_OR_DEPARTMENT_OTHER): Payer: Medicare Other

## 2014-06-29 VITALS — BP 103/66 | HR 62 | Temp 97.5°F

## 2014-06-29 DIAGNOSIS — C3412 Malignant neoplasm of upper lobe, left bronchus or lung: Secondary | ICD-10-CM

## 2014-06-29 DIAGNOSIS — Z452 Encounter for adjustment and management of vascular access device: Secondary | ICD-10-CM | POA: Diagnosis not present

## 2014-06-29 DIAGNOSIS — Z95828 Presence of other vascular implants and grafts: Secondary | ICD-10-CM

## 2014-06-29 MED ORDER — HEPARIN SOD (PORK) LOCK FLUSH 100 UNIT/ML IV SOLN
500.0000 [IU] | Freq: Once | INTRAVENOUS | Status: AC
  Administered 2014-06-29: 500 [IU] via INTRAVENOUS
  Filled 2014-06-29: qty 5

## 2014-06-29 MED ORDER — SODIUM CHLORIDE 0.9 % IJ SOLN
10.0000 mL | INTRAMUSCULAR | Status: DC | PRN
  Administered 2014-06-29: 10 mL via INTRAVENOUS
  Filled 2014-06-29: qty 10

## 2014-06-29 NOTE — Patient Instructions (Signed)

## 2014-07-06 ENCOUNTER — Other Ambulatory Visit (HOSPITAL_BASED_OUTPATIENT_CLINIC_OR_DEPARTMENT_OTHER): Payer: Medicare Other

## 2014-07-06 ENCOUNTER — Ambulatory Visit (HOSPITAL_BASED_OUTPATIENT_CLINIC_OR_DEPARTMENT_OTHER): Payer: Medicare Other | Admitting: Hematology & Oncology

## 2014-07-06 ENCOUNTER — Encounter: Payer: Self-pay | Admitting: Hematology & Oncology

## 2014-07-06 VITALS — BP 124/67 | HR 61 | Temp 97.7°F | Resp 20 | Ht 70.0 in | Wt 200.0 lb

## 2014-07-06 DIAGNOSIS — C3492 Malignant neoplasm of unspecified part of left bronchus or lung: Secondary | ICD-10-CM

## 2014-07-06 DIAGNOSIS — C3431 Malignant neoplasm of lower lobe, right bronchus or lung: Secondary | ICD-10-CM | POA: Diagnosis not present

## 2014-07-06 DIAGNOSIS — Z85820 Personal history of malignant melanoma of skin: Secondary | ICD-10-CM

## 2014-07-06 LAB — CBC WITH DIFFERENTIAL (CANCER CENTER ONLY)
BASO#: 0.1 10*3/uL (ref 0.0–0.2)
BASO%: 0.8 % (ref 0.0–2.0)
EOS ABS: 0.2 10*3/uL (ref 0.0–0.5)
EOS%: 3.2 % (ref 0.0–7.0)
HCT: 42.9 % (ref 38.7–49.9)
HGB: 14 g/dL (ref 13.0–17.1)
LYMPH#: 1.1 10*3/uL (ref 0.9–3.3)
LYMPH%: 16.4 % (ref 14.0–48.0)
MCH: 30.4 pg (ref 28.0–33.4)
MCHC: 32.6 g/dL (ref 32.0–35.9)
MCV: 93 fL (ref 82–98)
MONO#: 0.8 10*3/uL (ref 0.1–0.9)
MONO%: 12.4 % (ref 0.0–13.0)
NEUT#: 4.4 10*3/uL (ref 1.5–6.5)
NEUT%: 67.2 % (ref 40.0–80.0)
PLATELETS: 225 10*3/uL (ref 145–400)
RBC: 4.6 10*6/uL (ref 4.20–5.70)
RDW: 13.5 % (ref 11.1–15.7)
WBC: 6.5 10*3/uL (ref 4.0–10.0)

## 2014-07-06 LAB — CMP (CANCER CENTER ONLY)
ALBUMIN: 3.8 g/dL (ref 3.3–5.5)
ALT: 12 U/L (ref 10–47)
AST: 24 U/L (ref 11–38)
Alkaline Phosphatase: 98 U/L — ABNORMAL HIGH (ref 26–84)
BUN, Bld: 14 mg/dL (ref 7–22)
CO2: 29 mEq/L (ref 18–33)
Calcium: 9 mg/dL (ref 8.0–10.3)
Chloride: 103 mEq/L (ref 98–108)
Creat: 1.1 mg/dl (ref 0.6–1.2)
Glucose, Bld: 97 mg/dL (ref 73–118)
Potassium: 4.4 mEq/L (ref 3.3–4.7)
SODIUM: 146 meq/L — AB (ref 128–145)
TOTAL PROTEIN: 7.2 g/dL (ref 6.4–8.1)
Total Bilirubin: 0.7 mg/dl (ref 0.20–1.60)

## 2014-07-07 NOTE — Progress Notes (Signed)
Hematology and Oncology Follow Up Visit  [name] Fields [id number] Oct 29, 1934 80 y.o. 07/07/2014   Principle Diagnosis:   Stage IIIB (T4N2M0) squamous cell carcinoma of the left lung  Stage II (T3aN0M0) melanoma of the left face  Stage IA (T1N0M0) squamous cell carcinoma of the right lower lung  Current Therapy:    Observation     Interim History:  [name] Fields is in for his first office visit. He was initially seen at the main [facility]. Because of where he lives, it disease here for him to come see [place].  He is seen every 6 months or so.  He is doing well. He does have some issues with vertigo. He does use a cane to walk.  He's had no problems with cough. He's had no shortness of breath. He's had no nausea or vomiting. He's had no change in bowel or bladder habits.  He did have a CT scan of the chest done in February. This did not show any evidence of recurrent disease. He does have some coronary artery calcifications with a coronary stent.  He's had no change in medications. He's had no fever. He's had no leg swelling. He's had no rashes.  Overall, his performance status is ECOG 1-2.  Medications:  Current outpatient prescriptions:  .  atorvastatin (LIPITOR) 20 MG tablet, TAKE 1 TABLET BY MOUTH DAILY, Disp: 90 tablet, Rfl: 1 .  buPROPion (WELLBUTRIN SR) 150 MG 12 hr tablet, Take 150 mg by mouth daily. , Disp: , Rfl:  .  clopidogrel (PLAVIX) 75 MG tablet, TAKE 1 TABLET BY MOUTH EVERY DAY, Disp: 30 tablet, Rfl: 6 .  Linaclotide (LINZESS PO), Take 1 tablet by mouth daily., Disp: , Rfl:  .  meclizine (ANTIVERT) 12.5 MG tablet, Take 12.5 mg by mouth as needed for dizziness., Disp: , Rfl:  .  metoprolol tartrate (LOPRESSOR) 25 MG tablet, Take 1/2 tablet twice a day, Disp: 45 tablet, Rfl: 3 .  pantoprazole (PROTONIX) 40 MG tablet, TAKE 1 TABLET BY MOUTH TWICE DAILY, Disp: 60 tablet, Rfl: 0 .  Tamsulosin HCl (FLOMAX) 0.4 MG CAPS, Take 0.4 mg by mouth daily with supper. ,  Disp: , Rfl:  .  temazepam (RESTORIL) 30 MG capsule, Take 30 mg by mouth at bedtime as needed for sleep., Disp: , Rfl:  .  venlafaxine XR (EFFEXOR-XR) 150 MG 24 hr capsule, Take by mouth daily. , Disp: , Rfl:   Allergies:  Allergies  Allergen Reactions  . Sertraline Hcl Other (See Comments)    unknown    Past Medical History, Surgical history, Social history, and Family History were reviewed and updated.  Review of Systems: As above  Physical Exam:  height is 5\' 10"  (1.778 m) and weight is 200 lb (90.719 kg). His oral temperature is 97.7 F (36.5 C). His blood pressure is 124/67 and his pulse is 61. His respiration is 20.   Wt Readings from Last 3 Encounters:  07/06/14 200 lb (90.719 kg)  02/07/14 190 lb 12.8 oz (86.546 kg)  07/18/13 183 lb 9.6 oz (83.28 kg)     Well-developed well-nourished white gentleman no obvious distress. Head and neck exam shows no ocular or oral lesions. He has no palpable cervical or supraclavicular lymph nodes. Lungs are clear. Cardiac exam regular rate and rhythm with no murmurs, rubs or bruits. Abdomen is soft. He has good bowel sounds. There is no fluid wave. There is no palpable liver or spleen tip. Back exam shows no tenderness over the spine,  ribs or hips. He has no muscle spasms. Extremities shows no clubbing, cyanosis or edema. He has age-related osteophytic changes. Has good strength in his muscles. Skin exam shows very fair skin. He has no suspicious hyperpigmented lesions.  Lab Results  Component Value Date   WBC 6.5 07/06/2014   HGB 14.0 07/06/2014   HCT 42.9 07/06/2014   MCV 93 07/06/2014   PLT 225 07/06/2014     Chemistry      Component Value Date/Time   NA 146* 07/06/2014 1331   NA 142 07/18/2013 0851   NA 142 11/11/2011 1127   K 4.4 07/06/2014 1331   K 5.0 07/18/2013 0851   K 4.6 11/11/2011 1127   CL 103 07/06/2014 1331   CL 104 08/27/2012 1421   CL 105 11/11/2011 1127   CO2 29 07/06/2014 1331   CO2 28 07/18/2013 0851   CO2  27 11/11/2011 1127   BUN 14 07/06/2014 1331   BUN 15.6 05/15/2014 0908   BUN 14 11/11/2011 1127   CREATININE 1.1 07/06/2014 1331   CREATININE 0.9 05/15/2014 0908   CREATININE 0.91 11/11/2011 1127      Component Value Date/Time   CALCIUM 9.0 07/06/2014 1331   CALCIUM 9.8 07/18/2013 0851   CALCIUM 9.5 11/11/2011 1127   ALKPHOS 98* 07/06/2014 1331   ALKPHOS 193* 07/18/2013 0851   ALKPHOS 77 11/11/2011 1127   AST 24 07/06/2014 1331   AST 25 07/18/2013 0851   AST 18 11/11/2011 1127   ALT 12 07/06/2014 1331   ALT 23 07/18/2013 0851   ALT 10 11/11/2011 1127   BILITOT 0.70 07/06/2014 1331   BILITOT 1.14 07/18/2013 0851   BILITOT 0.8 11/11/2011 1127         Impression and Plan: [name] Fields is 80 year old white male. He has multiple malignancies. Everything looks good from my point of view.  The most recent malignancy, that being the stage I lung cancer was found in April 2013. He underwent stereotactic radiosurgery in July 2013.  From my point of view, I think that he just Fields a chest x-ray. We will plan for a chest x-ray was see him back. I will plan to get him back in 6 months.  It was nice to see him.  I spent about 35 minutes with him going through his records and talking to someone about skin protection in the sun as he is at significant risk for melanoma because of past melanoma and his fair skin color.   Volanda Napoleon, MD 4/1/20167:21 AM

## 2014-07-29 ENCOUNTER — Other Ambulatory Visit: Payer: Self-pay | Admitting: Cardiology

## 2014-07-31 ENCOUNTER — Other Ambulatory Visit: Payer: Self-pay

## 2014-07-31 MED ORDER — METOPROLOL TARTRATE 25 MG PO TABS: ORAL_TABLET | ORAL | Status: DC

## 2014-08-07 DIAGNOSIS — H259 Unspecified age-related cataract: Secondary | ICD-10-CM | POA: Diagnosis not present

## 2014-08-07 DIAGNOSIS — H25812 Combined forms of age-related cataract, left eye: Secondary | ICD-10-CM | POA: Diagnosis not present

## 2014-08-07 DIAGNOSIS — H2512 Age-related nuclear cataract, left eye: Secondary | ICD-10-CM | POA: Diagnosis not present

## 2014-08-08 DIAGNOSIS — H2511 Age-related nuclear cataract, right eye: Secondary | ICD-10-CM | POA: Diagnosis not present

## 2014-08-21 DIAGNOSIS — H25811 Combined forms of age-related cataract, right eye: Secondary | ICD-10-CM | POA: Diagnosis not present

## 2014-08-21 DIAGNOSIS — H2511 Age-related nuclear cataract, right eye: Secondary | ICD-10-CM | POA: Diagnosis not present

## 2014-08-21 DIAGNOSIS — H259 Unspecified age-related cataract: Secondary | ICD-10-CM | POA: Diagnosis not present

## 2014-08-24 ENCOUNTER — Ambulatory Visit (HOSPITAL_BASED_OUTPATIENT_CLINIC_OR_DEPARTMENT_OTHER): Payer: Medicare Other

## 2014-08-24 VITALS — BP 115/65 | HR 61 | Temp 97.5°F

## 2014-08-24 DIAGNOSIS — Z95828 Presence of other vascular implants and grafts: Secondary | ICD-10-CM

## 2014-08-24 DIAGNOSIS — C3431 Malignant neoplasm of lower lobe, right bronchus or lung: Secondary | ICD-10-CM

## 2014-08-24 DIAGNOSIS — Z452 Encounter for adjustment and management of vascular access device: Secondary | ICD-10-CM | POA: Diagnosis not present

## 2014-08-24 MED ORDER — SODIUM CHLORIDE 0.9 % IJ SOLN
10.0000 mL | INTRAMUSCULAR | Status: DC | PRN
  Administered 2014-08-24: 10 mL via INTRAVENOUS
  Filled 2014-08-24: qty 10

## 2014-08-24 MED ORDER — HEPARIN SOD (PORK) LOCK FLUSH 100 UNIT/ML IV SOLN
500.0000 [IU] | Freq: Once | INTRAVENOUS | Status: AC
  Administered 2014-08-24: 500 [IU] via INTRAVENOUS
  Filled 2014-08-24: qty 5

## 2014-08-24 NOTE — Patient Instructions (Signed)

## 2014-09-06 ENCOUNTER — Encounter: Payer: Self-pay | Admitting: Cardiology

## 2014-09-06 ENCOUNTER — Ambulatory Visit (INDEPENDENT_AMBULATORY_CARE_PROVIDER_SITE_OTHER): Payer: Medicare Other | Admitting: Cardiology

## 2014-09-06 VITALS — BP 112/70 | HR 62 | Ht 70.0 in | Wt 197.8 lb

## 2014-09-06 DIAGNOSIS — I493 Ventricular premature depolarization: Secondary | ICD-10-CM

## 2014-09-06 DIAGNOSIS — I319 Disease of pericardium, unspecified: Secondary | ICD-10-CM | POA: Diagnosis not present

## 2014-09-06 DIAGNOSIS — E785 Hyperlipidemia, unspecified: Secondary | ICD-10-CM

## 2014-09-06 DIAGNOSIS — I2583 Coronary atherosclerosis due to lipid rich plaque: Secondary | ICD-10-CM

## 2014-09-06 DIAGNOSIS — I3139 Other pericardial effusion (noninflammatory): Secondary | ICD-10-CM

## 2014-09-06 DIAGNOSIS — I313 Pericardial effusion (noninflammatory): Secondary | ICD-10-CM

## 2014-09-06 DIAGNOSIS — I2510 Atherosclerotic heart disease of native coronary artery without angina pectoris: Secondary | ICD-10-CM

## 2014-09-06 DIAGNOSIS — I252 Old myocardial infarction: Secondary | ICD-10-CM | POA: Diagnosis not present

## 2014-09-06 NOTE — Patient Instructions (Signed)
Medication Instructions:  Your physician recommends that you continue on your current medications as directed. Please refer to the Current Medication list given to you today.  Follow-Up: Follow up in 6 months with Dr. Marlou Porch.  You will receive a letter in the mail 2 months before you are due.  Please call us when you receive this letter to schedule your follow up appointment.  Thank you for choosing [facility]!!

## 2014-09-06 NOTE — Progress Notes (Signed)
[place]. [address] [place], [place]  [address] Phone: [phone] Fax:  [phone]  Date:  09/06/2014   ID:  [name] Fields, DOB Mar 01, 1935, MRN [id number]  PCP:  [name], MD   History of Present Illness: [name] Fields is a 80 y.o. male with coronary artery disease status post multiple stents in the past, former patient of Dr. Doreatha Lew here for followup. Recurrent pleural effusion.  Done with radiation tx for lung cancer. CT scan 01/03/13. 6 month CT interval.  In April of 2001, lateral wall MI, circumflex stent with brachii therapy and angioplasty of stent in 2001. In 2011 underwent a pericardial window because of pericardial effusion. He walks 6 days a week 30 min a day. COPD, Dr. Lamonte Sakai. Has to stop during walk.   LDL 63 on 03/23/12, creatinine 0.92.  Vertigo, fell, hit head. 16 weeks of therapy.   Overall he feels somewhat tired during exercise. Continue to walk.   Wt Readings from Last 3 Encounters:  09/06/14 197 lb 12.8 oz (89.721 kg)  07/06/14 200 lb (90.719 kg)  02/07/14 190 lb 12.8 oz (86.546 kg)     Past Medical History  Diagnosis Date  . IHD (ischemic heart disease)     Remote MI in 2001 with stent to the LCX  . Hypertension   . Hyperlipidemia   . SOB (shortness of breath)     Chronic with known fibrosis  . Hypokinesia     MILD INFERIOR  . GERD (gastroesophageal reflux disease)   . Lactose intolerance   . Diverticulosis   . Hearing loss   . Pleural effusion, left   . Pericardial effusion July 2011    s/p pericardial window; last echo in August 2012 with minimal effusion  . Normal nuclear stress test 2009    EF 63%. No ischemia. 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  . Non-small cell carcinoma of lung     Dx in October of 2010  . Arthritis     knees  . Anxiety     takes wellbutrin daily  . Hx of radiation therapy 09/29/11;10/01/11;10/03/11;10/06/11;10/08/11;    RLLlung,50Gy/20f - SBRT  . Status post chemotherapy 06/04/2009 -  09/06/2009   4 cycles of Cisplatin and Gemcitabine with Neulasta Support     Past Surgical History  Procedure Laterality Date  . Pericardial window  July 2011  . Cardiovascular stress test  05/2007    EF 63%  . Coronary stent placement  07/1999    STENT TO THE LEFT CIRCUMFLEX  . Cardiac catheterization  07/11/2003    EF 60%, REPEAT, WHICH SHOWED 30% PROXIMAL NARROWING IN THE RIGHT  CORONARY ARTERY  . Transthoracic echocardiogram  02/22/2010    EF 55-60%  . Hernia repair      as an adult- R inguinal     Current Outpatient Prescriptions  Medication Sig Dispense Refill  . atorvastatin (LIPITOR) 20 MG tablet TAKE 1 TABLET BY MOUTH DAILY 90 tablet 1  . BESIVANCE 0.6 % SUSP Place 1 drop into the left eye 3 (three) times daily.  1  . buPROPion (WELLBUTRIN SR) 150 MG 12 hr tablet Take 150 mg by mouth daily.     . clopidogrel (PLAVIX) 75 MG tablet TAKE 1 TABLET BY MOUTH EVERY DAY 30 tablet 6  . DUREZOL 0.05 % EMUL Place 1 drop into both eyes 3 (three) times daily.  1  . ILEVRO 0.3 % ophthalmic suspension Place 1 drop into the right eye at  bedtime.  1  . Linaclotide (LINZESS PO) Take 1 tablet by mouth daily.    . meclizine (ANTIVERT) 12.5 MG tablet Take 12.5 mg by mouth as needed for dizziness.    . metoprolol tartrate (LOPRESSOR) 25 MG tablet Take 1/2 tablet twice a day 45 tablet 3  . pantoprazole (PROTONIX) 40 MG tablet TAKE 1 TABLET BY MOUTH TWICE DAILY 60 tablet 0  . Tamsulosin HCl (FLOMAX) 0.4 MG CAPS Take 0.4 mg by mouth daily with supper.     . temazepam (RESTORIL) 30 MG capsule Take 30 mg by mouth at bedtime as needed for sleep.    Marland Kitchen venlafaxine XR (EFFEXOR-XR) 150 MG 24 hr capsule Take by mouth daily.      No current facility-administered medications for this visit.    Allergies:    Allergies  Allergen Reactions  . Sertraline Hcl Other (See Comments)    unknown    Social History:  The patient  reports that he quit smoking about 20 years ago. His smoking use included Cigarettes. He has a 120  pack-year smoking history. He has never used smokeless tobacco. He reports that he does not drink alcohol or use illicit drugs.   ROS:  Please see the history of present illness.   Denies any syncope, bleeding, orthopnea, PND    PHYSICAL EXAM: VS:  BP 112/70 mmHg  Pulse 62  Ht '5\' 10"'$  (1.778 m)  Wt 197 lb 12.8 oz (89.721 kg)  BMI 28.38 kg/m2 Well nourished, well developed, in no acute distress HEENT: Multiple excoriations on nose Neck: no JVD Cardiac:  normal S1, S2; RRR; no murmur Lungs:   + wheezing,no  rhonchi or rales Abd: soft, nontender, no hepatomegaly Ext: no edema Skin: warm and dry Neuro: no focal abnormalities noted  Nuclear stress test 07/19/12-basal to mid lateral wall fixed defect consistent with infarct. No ischemia, EF 58%.  EKG: 02/24/14-demonstrates sinus rhythm, right bundle branch block, first degree AV block, PR interval 238m. Inferior infarct patternNo significant change from prior  ASSESSMENT AND PLAN:  1. History of Lung cancer-currently maxed out on radiation therapy. Recent CT scan. Been monitored closely by oncology/radiation oncology. Used to see Dr. MWeber Cooks Now sees Enniver. 2. Coronary artery disease-no active anginal symptoms. Exercising well.  3. Prior PCI's-no recent anginal symptoms. 4. Hyperlipidemia-continue with statin therapy. If Dr. GInda Merlindoes not check during physical, I will be glad to check at next visit. 5. Former tobacco use 6. COPD-Dr. CGwenette Greetprior, I do appreciate some active wheezing. Continue with inhalers 7. 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 Old pericardial effusion-prior window. 9. I told him to contact me if any further cardiovascular needs are needed in the meantime, I will see him back in 6 months. Continue to walk, exercise. This will help him with his energy levels.  Signed, MCandee Furbish MD [facility] 09/06/2014 10:25 AM

## 2014-09-25 DIAGNOSIS — I252 Old myocardial infarction: Secondary | ICD-10-CM | POA: Diagnosis not present

## 2014-09-25 DIAGNOSIS — Z85118 Personal history of other malignant neoplasm of bronchus and lung: Secondary | ICD-10-CM | POA: Diagnosis not present

## 2014-09-25 DIAGNOSIS — E780 Pure hypercholesterolemia: Secondary | ICD-10-CM | POA: Diagnosis not present

## 2014-09-25 DIAGNOSIS — E559 Vitamin D deficiency, unspecified: Secondary | ICD-10-CM | POA: Diagnosis not present

## 2014-09-25 DIAGNOSIS — Z79899 Other long term (current) drug therapy: Secondary | ICD-10-CM | POA: Diagnosis not present

## 2014-09-25 DIAGNOSIS — K219 Gastro-esophageal reflux disease without esophagitis: Secondary | ICD-10-CM | POA: Diagnosis not present

## 2014-09-25 DIAGNOSIS — M7580 Other shoulder lesions, unspecified shoulder: Secondary | ICD-10-CM | POA: Diagnosis not present

## 2014-09-25 DIAGNOSIS — B354 Tinea corporis: Secondary | ICD-10-CM | POA: Diagnosis not present

## 2014-09-25 DIAGNOSIS — I2510 Atherosclerotic heart disease of native coronary artery without angina pectoris: Secondary | ICD-10-CM | POA: Diagnosis not present

## 2014-09-25 DIAGNOSIS — I10 Essential (primary) hypertension: Secondary | ICD-10-CM | POA: Diagnosis not present

## 2014-09-25 DIAGNOSIS — J449 Chronic obstructive pulmonary disease, unspecified: Secondary | ICD-10-CM | POA: Diagnosis not present

## 2014-10-03 ENCOUNTER — Other Ambulatory Visit: Payer: Self-pay | Admitting: Cardiology

## 2014-10-04 DIAGNOSIS — Z1211 Encounter for screening for malignant neoplasm of colon: Secondary | ICD-10-CM | POA: Diagnosis not present

## 2014-10-04 DIAGNOSIS — Z1212 Encounter for screening for malignant neoplasm of rectum: Secondary | ICD-10-CM | POA: Diagnosis not present

## 2014-10-05 NOTE — Telephone Encounter (Signed)
Per note 6.1.16

## 2014-11-02 ENCOUNTER — Ambulatory Visit (HOSPITAL_BASED_OUTPATIENT_CLINIC_OR_DEPARTMENT_OTHER): Payer: Medicare Other

## 2014-11-02 VITALS — BP 99/81 | HR 63 | Temp 97.7°F | Resp 20 | Wt 199.8 lb

## 2014-11-02 DIAGNOSIS — C3492 Malignant neoplasm of unspecified part of left bronchus or lung: Secondary | ICD-10-CM

## 2014-11-02 DIAGNOSIS — Z452 Encounter for adjustment and management of vascular access device: Secondary | ICD-10-CM | POA: Diagnosis present

## 2014-11-02 DIAGNOSIS — C3431 Malignant neoplasm of lower lobe, right bronchus or lung: Secondary | ICD-10-CM

## 2014-11-02 MED ORDER — HEPARIN SOD (PORK) LOCK FLUSH 100 UNIT/ML IV SOLN
500.0000 [IU] | Freq: Once | INTRAVENOUS | Status: AC
  Administered 2014-11-02: 500 [IU] via INTRAVENOUS
  Filled 2014-11-02: qty 5

## 2014-11-02 MED ORDER — SODIUM CHLORIDE 0.9 % IJ SOLN
10.0000 mL | INTRAMUSCULAR | Status: DC | PRN
Start: 2014-11-02 — End: 2014-11-02
  Administered 2014-11-02: 10 mL via INTRAVENOUS
  Filled 2014-11-02: qty 10

## 2014-11-02 NOTE — Patient Instructions (Signed)

## 2014-11-21 ENCOUNTER — Encounter (HOSPITAL_COMMUNITY): Payer: Self-pay

## 2014-11-21 ENCOUNTER — Ambulatory Visit (HOSPITAL_COMMUNITY)
Admission: RE | Admit: 2014-11-21 | Discharge: 2014-11-21 | Disposition: A | Discharge status: Home / Self Care | Payer: Medicare Other | Source: Ambulatory Visit | Attending: Radiation Oncology | Admitting: Radiation Oncology

## 2014-11-21 DIAGNOSIS — Z85118 Personal history of other malignant neoplasm of bronchus and lung: Secondary | ICD-10-CM | POA: Diagnosis not present

## 2014-11-21 DIAGNOSIS — R918 Other nonspecific abnormal finding of lung field: Secondary | ICD-10-CM | POA: Diagnosis not present

## 2014-11-21 DIAGNOSIS — Z08 Encounter for follow-up examination after completed treatment for malignant neoplasm: Secondary | ICD-10-CM | POA: Insufficient documentation

## 2014-11-21 DIAGNOSIS — I313 Pericardial effusion (noninflammatory): Secondary | ICD-10-CM | POA: Diagnosis not present

## 2014-11-21 DIAGNOSIS — Z923 Personal history of irradiation: Secondary | ICD-10-CM | POA: Diagnosis not present

## 2014-11-21 DIAGNOSIS — J948 Other specified pleural conditions: Secondary | ICD-10-CM | POA: Insufficient documentation

## 2014-11-21 DIAGNOSIS — C3431 Malignant neoplasm of lower lobe, right bronchus or lung: Secondary | ICD-10-CM | POA: Diagnosis not present

## 2014-12-03 ENCOUNTER — Other Ambulatory Visit: Payer: Self-pay | Admitting: Cardiology

## 2014-12-19 ENCOUNTER — Encounter (HOSPITAL_COMMUNITY): Payer: Self-pay | Admitting: Emergency Medicine

## 2014-12-19 ENCOUNTER — Inpatient Hospital Stay (HOSPITAL_COMMUNITY)
Admission: EM | Admit: 2014-12-19 | Discharge: 2014-12-22 | DRG: 813 | Disposition: A | Discharge status: Home / Self Care | Payer: Medicare Other | Attending: Internal Medicine | Admitting: Internal Medicine

## 2014-12-19 DIAGNOSIS — K449 Diaphragmatic hernia without obstruction or gangrene: Secondary | ICD-10-CM | POA: Diagnosis present

## 2014-12-19 DIAGNOSIS — F329 Major depressive disorder, single episode, unspecified: Secondary | ICD-10-CM | POA: Diagnosis present

## 2014-12-19 DIAGNOSIS — K5730 Diverticulosis of large intestine without perforation or abscess without bleeding: Secondary | ICD-10-CM | POA: Diagnosis not present

## 2014-12-19 DIAGNOSIS — Z801 Family history of malignant neoplasm of trachea, bronchus and lung: Secondary | ICD-10-CM | POA: Diagnosis not present

## 2014-12-19 DIAGNOSIS — I252 Old myocardial infarction: Secondary | ICD-10-CM | POA: Diagnosis not present

## 2014-12-19 DIAGNOSIS — J439 Emphysema, unspecified: Secondary | ICD-10-CM | POA: Diagnosis present

## 2014-12-19 DIAGNOSIS — Z79899 Other long term (current) drug therapy: Secondary | ICD-10-CM

## 2014-12-19 DIAGNOSIS — F419 Anxiety disorder, unspecified: Secondary | ICD-10-CM | POA: Diagnosis present

## 2014-12-19 DIAGNOSIS — Z85118 Personal history of other malignant neoplasm of bronchus and lung: Secondary | ICD-10-CM | POA: Diagnosis not present

## 2014-12-19 DIAGNOSIS — J90 Pleural effusion, not elsewhere classified: Secondary | ICD-10-CM | POA: Diagnosis not present

## 2014-12-19 DIAGNOSIS — I2510 Atherosclerotic heart disease of native coronary artery without angina pectoris: Secondary | ICD-10-CM | POA: Diagnosis not present

## 2014-12-19 DIAGNOSIS — Z923 Personal history of irradiation: Secondary | ICD-10-CM | POA: Diagnosis not present

## 2014-12-19 DIAGNOSIS — Z859 Personal history of malignant neoplasm, unspecified: Secondary | ICD-10-CM

## 2014-12-19 DIAGNOSIS — I313 Pericardial effusion (noninflammatory): Secondary | ICD-10-CM | POA: Diagnosis not present

## 2014-12-19 DIAGNOSIS — Z952 Presence of prosthetic heart valve: Secondary | ICD-10-CM | POA: Diagnosis not present

## 2014-12-19 DIAGNOSIS — Z955 Presence of coronary angioplasty implant and graft: Secondary | ICD-10-CM | POA: Diagnosis not present

## 2014-12-19 DIAGNOSIS — Z7902 Long term (current) use of antithrombotics/antiplatelets: Secondary | ICD-10-CM | POA: Diagnosis not present

## 2014-12-19 DIAGNOSIS — E785 Hyperlipidemia, unspecified: Secondary | ICD-10-CM | POA: Diagnosis not present

## 2014-12-19 DIAGNOSIS — J449 Chronic obstructive pulmonary disease, unspecified: Secondary | ICD-10-CM | POA: Diagnosis present

## 2014-12-19 DIAGNOSIS — Z87891 Personal history of nicotine dependence: Secondary | ICD-10-CM

## 2014-12-19 DIAGNOSIS — D696 Thrombocytopenia, unspecified: Secondary | ICD-10-CM | POA: Diagnosis not present

## 2014-12-19 DIAGNOSIS — Z85820 Personal history of malignant melanoma of skin: Secondary | ICD-10-CM

## 2014-12-19 DIAGNOSIS — E0781 Sick-euthyroid syndrome: Secondary | ICD-10-CM | POA: Diagnosis present

## 2014-12-19 DIAGNOSIS — D469 Myelodysplastic syndrome, unspecified: Secondary | ICD-10-CM | POA: Diagnosis present

## 2014-12-19 DIAGNOSIS — K219 Gastro-esophageal reflux disease without esophagitis: Secondary | ICD-10-CM | POA: Diagnosis not present

## 2014-12-19 DIAGNOSIS — R0602 Shortness of breath: Secondary | ICD-10-CM | POA: Diagnosis not present

## 2014-12-19 DIAGNOSIS — C3492 Malignant neoplasm of unspecified part of left bronchus or lung: Secondary | ICD-10-CM | POA: Diagnosis not present

## 2014-12-19 DIAGNOSIS — I10 Essential (primary) hypertension: Secondary | ICD-10-CM | POA: Diagnosis not present

## 2014-12-19 DIAGNOSIS — I319 Disease of pericardium, unspecified: Secondary | ICD-10-CM | POA: Diagnosis present

## 2014-12-19 DIAGNOSIS — I3139 Other pericardial effusion (noninflammatory): Secondary | ICD-10-CM | POA: Diagnosis present

## 2014-12-19 DIAGNOSIS — D7589 Other specified diseases of blood and blood-forming organs: Secondary | ICD-10-CM | POA: Diagnosis not present

## 2014-12-19 DIAGNOSIS — J438 Other emphysema: Secondary | ICD-10-CM | POA: Diagnosis not present

## 2014-12-19 DIAGNOSIS — I708 Atherosclerosis of other arteries: Secondary | ICD-10-CM | POA: Diagnosis not present

## 2014-12-19 DIAGNOSIS — R109 Unspecified abdominal pain: Secondary | ICD-10-CM | POA: Diagnosis not present

## 2014-12-19 DIAGNOSIS — Z9221 Personal history of antineoplastic chemotherapy: Secondary | ICD-10-CM

## 2014-12-19 DIAGNOSIS — K8020 Calculus of gallbladder without cholecystitis without obstruction: Secondary | ICD-10-CM | POA: Diagnosis not present

## 2014-12-19 DIAGNOSIS — R918 Other nonspecific abnormal finding of lung field: Secondary | ICD-10-CM | POA: Diagnosis not present

## 2014-12-19 DIAGNOSIS — I25111 Atherosclerotic heart disease of native coronary artery with angina pectoris with documented spasm: Secondary | ICD-10-CM | POA: Diagnosis not present

## 2014-12-19 DIAGNOSIS — C3412 Malignant neoplasm of upper lobe, left bronchus or lung: Secondary | ICD-10-CM | POA: Diagnosis not present

## 2014-12-19 DIAGNOSIS — C3490 Malignant neoplasm of unspecified part of unspecified bronchus or lung: Secondary | ICD-10-CM

## 2014-12-19 DIAGNOSIS — R1013 Epigastric pain: Secondary | ICD-10-CM | POA: Diagnosis not present

## 2014-12-19 LAB — COMPREHENSIVE METABOLIC PANEL
ALT: 14 U/L — AB (ref 17–63)
AST: 19 U/L (ref 15–41)
Albumin: 4.1 g/dL (ref 3.5–5.0)
Alkaline Phosphatase: 123 U/L (ref 38–126)
Anion gap: 9 (ref 5–15)
BUN: 15 mg/dL (ref 6–20)
CO2: 26 mmol/L (ref 22–32)
CREATININE: 0.93 mg/dL (ref 0.61–1.24)
Calcium: 9.5 mg/dL (ref 8.9–10.3)
Chloride: 103 mmol/L (ref 101–111)
GFR calc Af Amer: >60 mL/min (ref >60)
GFR calc non Af Amer: >60 mL/min (ref >60)
Glucose, Bld: 93 mg/dL (ref 65–99)
POTASSIUM: 4.1 mmol/L (ref 3.5–5.1)
Sodium: 138 mmol/L (ref 135–145)
Total Bilirubin: 1.2 mg/dL (ref 0.3–1.2)
Total Protein: 7.4 g/dL (ref 6.5–8.1)

## 2014-12-19 LAB — CBC WITH DIFFERENTIAL/PLATELET
Basophils Absolute: 0.1 10*3/uL (ref 0.0–0.1)
Basophils Relative: 1 % (ref 0–1)
EOS ABS: 0.2 10*3/uL (ref 0.0–0.7)
Eosinophils Relative: 2 % (ref 0–5)
HEMATOCRIT: 44.4 % (ref 39.0–52.0)
HEMOGLOBIN: 15 g/dL (ref 13.0–17.0)
LYMPHS PCT: 16 % (ref 12–46)
Lymphs Abs: 1.3 10*3/uL (ref 0.7–4.0)
MCH: 28.5 pg (ref 26.0–34.0)
MCHC: 33.8 g/dL (ref 30.0–36.0)
MCV: 84.4 fL (ref 78.0–100.0)
MONOS PCT: 13 % — AB (ref 3–12)
Monocytes Absolute: 1.1 10*3/uL — ABNORMAL HIGH (ref 0.1–1.0)
NEUTROS PCT: 68 % (ref 43–77)
Neutro Abs: 5.7 10*3/uL (ref 1.7–7.7)
Platelets: 47 10*3/uL — ABNORMAL LOW (ref 150–400)
RBC: 5.26 MIL/uL (ref 4.22–5.81)
RDW: 14.9 % (ref 11.5–15.5)
WBC: 8.3 10*3/uL (ref 4.0–10.5)

## 2014-12-19 LAB — LIPASE, BLOOD: LIPASE: 36 U/L (ref 22–51)

## 2014-12-19 MED ORDER — ALBUTEROL SULFATE (2.5 MG/3ML) 0.083% IN NEBU
5.0000 mg | INHALATION_SOLUTION | Freq: Once | RESPIRATORY_TRACT | Status: AC
  Administered 2014-12-19: 5 mg via RESPIRATORY_TRACT
  Filled 2014-12-19: qty 6

## 2014-12-19 NOTE — H&P (Addendum)
[facility] History and Physical  [name] Fields ZOX:[id number] DOB: [birth year] DOA: 12/19/2014  Referring physician: Charlesetta Shanks, MD PCP: [name], MD   Chief Complaint: Bruising  HPI: [name] Fields is a 80 y.o. male with prior history of HLD HTN GERD Lung Cancer pericardial effusion presents with abnormal low platelets. Patient stats that for about 2 months he had noted bruise spots come up on his stomach. He also had been having a lot of pain in the stomnach. He originally thought it was his GERD. The patient states he went to his PCP where he had lab work drawn and also had a CT scan done. He has the disc with him. No report is available on care anywhere. Patient states that he has been experiencing SOB which is why the CT was done. He did not have any PE. The labs showed that his platelets were low and therefore he was sent to the ED for evaluation. He has had easy bruising. And has not had blood in his stool. He has noted excessive bleeding when he shaves. No fevers or chills or headaches. He has no chest pain. In addition he had a moderate pericardial effusion noted on the CT. This was also noted back in August of this year on a prior CT done here. He has no echo available to look at other than in 2012 which showed a small effusion at that time   Review of Systems:  Complete systems reviewed and are unremarkable other than HPI.   Past Medical History  Diagnosis Date  . IHD (ischemic heart disease)     Remote MI in 2001 with stent to the LCX  . Hypertension   . Hyperlipidemia   . SOB (shortness of breath)     Chronic with known fibrosis  . Hypokinesia     MILD INFERIOR  . GERD (gastroesophageal reflux disease)   . Lactose intolerance   . Diverticulosis   . Hearing loss   . Pleural effusion, left   . Pericardial effusion July 2011    s/p pericardial window; last echo in August 2012 with minimal effusion  . Normal nuclear stress test 2009    EF 63%.  No ischemia. 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  . Arthritis     knees  . Anxiety     takes wellbutrin daily  . Hx of radiation therapy 09/29/11;10/01/11;10/03/11;10/06/11;10/08/11;    RLLlung,50Gy/16f - SBRT  . Status post chemotherapy 06/04/2009 -  09/06/2009     4 cycles of Cisplatin and Gemcitabine with Neulasta Support   . Non-small cell carcinoma of lung     Dx in October of 2010   Past Surgical History  Procedure Laterality Date  . Pericardial window  July 2011  . Cardiovascular stress test  05/2007    EF 63%  . Coronary stent placement  07/1999    STENT TO THE LEFT CIRCUMFLEX  . Cardiac catheterization  07/11/2003    EF 60%, REPEAT, WHICH SHOWED 30% PROXIMAL NARROWING IN THE RIGHT  CORONARY ARTERY  . Transthoracic echocardiogram  02/22/2010    EF 55-60%  . Hernia repair      as an adult- R inguinal    Social History:  reports that he quit smoking about 20 years ago. His smoking use included Cigarettes. He has a 120 pack-year smoking history. He has never used smokeless tobacco. He reports that he does not drink alcohol or use illicit drugs.  Allergies  Allergen Reactions  . Sertraline Hcl Other (See  Comments)    Patient does not remember the reaction    Family History  Problem Relation Age of Onset  . Lung cancer Father   . Heart disease Neg Hx   . Anesthesia problems Neg Hx      Prior to Admission medications   Medication Sig Start Date End Date Taking? Authorizing Provider  atorvastatin (LIPITOR) 20 MG tablet TAKE 1 TABLET BY MOUTH DAILY 10/05/14  Yes Jerline Pain, MD  buPROPion [facility] SR) 150 MG 12 hr tablet Take 150 mg by mouth daily.    Yes Historical Provider, MD  clopidogrel (PLAVIX) 75 MG tablet TAKE 1 TABLET BY MOUTH DAILY 12/04/14  Yes Jerline Pain, MD  metoprolol tartrate (LOPRESSOR) 25 MG tablet Take 1/2 tablet twice a day 07/31/14  Yes Jerline Pain, MD  pantoprazole (PROTONIX) 40 MG tablet TAKE 1 TABLET BY MOUTH TWICE DAILY 01/06/14  Yes Aasim Lona Kettle, MD  Tamsulosin  HCl (FLOMAX) 0.4 MG CAPS Take 0.4 mg by mouth daily with supper.    Yes Historical Provider, MD  temazepam (RESTORIL) 30 MG capsule Take 30 mg by mouth at bedtime as needed for sleep.   Yes Historical Provider, MD  venlafaxine XR (EFFEXOR-XR) 150 MG 24 hr capsule Take 1 capsule by mouth daily. 12/04/14  Yes Historical Provider, MD   Physical Exam: Filed Vitals:   12/19/14 1623  BP: 129/79  Pulse: 91  Temp: 98 F (36.7 C)  TempSrc: Oral  Resp: 18  SpO2: 97%    Wt Readings from Last 3 Encounters:  11/02/14 90.629 kg (199 lb 12.8 oz)  09/06/14 89.721 kg (197 lb 12.8 oz)  07/06/14 90.719 kg (200 lb)    General:  Appears calm and comfortable Eyes: PERRL, normal lids, irises & conjunctiva ENT: grossly normal hearing, lips & tongue Neck: no LAD, masses or thyromegaly Cardiovascular: RRR, no m/r/g. No LE edema Respiratory: CTA bilaterally, no w/r/r Abdomen: soft, ntnd Skin: healing bruise on left abdomen small petechiae Musculoskeletal: grossly normal tone BUE/BLE Psychiatric: grossly normal mood and affect Neurologic: grossly non-focal.          Labs on Admission:  Basic Metabolic Panel:  Recent Labs Lab 12/19/14 1651  NA 138  K 4.1  CL 103  CO2 26  GLUCOSE 93  BUN 15  CREATININE 0.93  CALCIUM 9.5   Liver Function Tests:  Recent Labs Lab 12/19/14 1651  AST 19  ALT 14*  ALKPHOS 123  BILITOT 1.2  PROT 7.4  ALBUMIN 4.1    Recent Labs Lab 12/19/14 1651  LIPASE 36   No results for input(s): AMMONIA in the last 168 hours. CBC:  Recent Labs Lab 12/19/14 1651  WBC 8.3  NEUTROABS 5.7  HGB 15.0  HCT 44.4  MCV 84.4  PLT 47*   Cardiac Enzymes: No results for input(s): CKTOTAL, CKMB, CKMBINDEX, TROPONINI in the last 168 hours.  BNP (last 3 results) No results for input(s): BNP in the last 8760 hours.  ProBNP (last 3 results) No results for input(s): PROBNP in the last 8760 hours.  CBG: No results for input(s): GLUCAP in the last 168  hours.  Radiological Exams on Admission: No results found.    Assessment/Plan Principal Problem:   Thrombocytopenia Active Problems:   COPD (chronic obstructive pulmonary disease) with emphysema   Pericardial effusion   CAD (coronary artery disease)   Hyperlipidemia   HTN (hypertension)   GERD (gastroesophageal reflux disease)   1. Thrombocytopenia -will need hematology to evaluate the patient spoke  with Dr Inda Merlin -repeat labs in am -will hold plavix for now. He has been on it for CAD in the past -will get iron studies Platelet antibody profile ANA retic count Hep B ag LDH Haptoglobin -no heparin baseline counts were normal in March no need for steroids  2. COPD -as needed albuterol  3. Pericardial Effusion -will have cardiology see patient I discussed it with ED they will call -will need echo to evaluate -hold Plavix for now  4. HLD -check lipid profile -continue with lipitor  5. HTN -will monitor pressures -continue with lopressor  6. GERD -PPI    Code Status: full code (must indicate code status--if unknown or must be presumed, indicate so) DVT Prophylaxis:SCD Family Communication: none (indicate person spoken with, if applicable, with phone number if by telephone) Disposition Plan: home (indicate anticipated LOS)    [facility] Pager [phone]

## 2014-12-19 NOTE — ED Notes (Signed)
Pt sts abd pain and SOB x weeks; pt sent for CT scans today and then told to come for some abnormalities; pt has results with him

## 2014-12-19 NOTE — ED Provider Notes (Signed)
CSN: [id number]     Arrival date & time 12/19/14  1546 History   First MD Initiated Contact with Patient 12/19/14 2218     Chief Complaint  Patient presents with  . Shortness of Breath  . Abdominal Pain     (Consider location/radiation/quality/duration/timing/severity/associated sxs/prior Treatment) HPI patient presented to his primary care provider with concerns for easy bruising. Bruises are the patient's abnormal predominantly. He reports the area is tender. Outpatient diagnostic workup was done including CT scan. This showed pericardial effusion and his labs showed difficult thrombocytopenia. Patient has been experiencing shortness of breath. He has not specifically had fever cough or vomiting. Past Medical History  Diagnosis Date  . IHD (ischemic heart disease)     Remote MI in 2001 with stent to the LCX  . Hypertension   . Hyperlipidemia   . SOB (shortness of breath)     Chronic with known fibrosis  . Hypokinesia     MILD INFERIOR  . GERD (gastroesophageal reflux disease)   . Lactose intolerance   . Diverticulosis   . Hearing loss   . Pleural effusion, left   . Pericardial effusion July 2011    s/p pericardial window; last echo in August 2012 with minimal effusion  . Normal nuclear stress test 2009    EF 63%. No ischemia. 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  . Arthritis     knees  . Anxiety     takes wellbutrin daily  . Hx of radiation therapy 09/29/11;10/01/11;10/03/11;10/06/11;10/08/11;    RLLlung,50Gy/40f - SBRT  . Status post chemotherapy 06/04/2009 -  09/06/2009     4 cycles of Cisplatin and Gemcitabine with Neulasta Support   . Non-small cell carcinoma of lung     Dx in October of 2010   Past Surgical History  Procedure Laterality Date  . Pericardial window  July 2011  . Cardiovascular stress test  05/2007    EF 63%  . Coronary stent placement  07/1999    STENT TO THE LEFT CIRCUMFLEX  . Cardiac catheterization  07/11/2003    EF 60%, REPEAT, WHICH SHOWED 30% PROXIMAL NARROWING IN  THE RIGHT  CORONARY ARTERY  . Transthoracic echocardiogram  02/22/2010    EF 55-60%  . Hernia repair      as an adult- R inguinal    Family History  Problem Relation Age of Onset  . Lung cancer Father   . Heart disease Neg Hx   . Anesthesia problems Neg Hx    Social History  Substance Use Topics  . Smoking status: Former Smoker -- 3.00 packs/day for 40 years    Types: Cigarettes    Quit date: 04/07/1994  . Smokeless tobacco: Never Used     Comment: quit 20 years ago  . Alcohol Use: No    Review of Systems 10 Systems reviewed and are negative for acute change except as noted in the HPI.    Allergies  Sertraline hcl  Home Medications   Prior to Admission medications   Medication Sig Start Date End Date Taking? Authorizing Provider  atorvastatin (LIPITOR) 20 MG tablet TAKE 1 TABLET BY MOUTH DAILY 10/05/14  Yes MJerline Pain MD  buPROPion ([facility]) 150 MG 12 hr tablet Take 150 mg by mouth daily.    Yes Historical Provider, MD  metoprolol tartrate (LOPRESSOR) 25 MG tablet Take 1/2 tablet twice a day 07/31/14  Yes MJerline Pain MD  pantoprazole (PROTONIX) 40 MG tablet TAKE 1 TABLET BY MOUTH TWICE DAILY 01/06/14  Yes  Bernadene Bell, MD  Tamsulosin HCl (FLOMAX) 0.4 MG CAPS Take 0.4 mg by mouth daily with supper.    Yes Historical Provider, MD  temazepam (RESTORIL) 30 MG capsule Take 30 mg by mouth at bedtime as needed for sleep.   Yes Historical Provider, MD  venlafaxine XR (EFFEXOR-XR) 150 MG 24 hr capsule Take 1 capsule by mouth daily. 12/04/14  Yes Historical Provider, MD  aspirin 81 MG chewable tablet Chew 1 tablet (81 mg total) by mouth daily. 12/22/14   Orson Eva, MD  colchicine 0.6 MG tablet Take 1 tablet (0.6 mg total) by mouth 2 (two) times daily. 12/22/14   Orson Eva, MD  cyanocobalamin 500 MCG tablet Take 1 tablet (500 mcg total) by mouth daily. 12/22/14   Orson Eva, MD   BP 109/65 mmHg  Pulse 81  Temp(Src) 97.6 F (36.4 C) (Oral)  Resp 18  Ht '5\' 10"'$  (1.778 m)  Wt  191 lb 12.8 oz (87 kg)  BMI 27.52 kg/m2  SpO2 94% Physical Exam  Constitutional: He is oriented to person, place, and time. He appears well-developed and well-nourished.  HENT:  Head: Normocephalic and atraumatic.  Eyes: EOM are normal. Pupils are equal, round, and reactive to light.  Neck: Neck supple.  Cardiovascular: Normal rate, regular rhythm, normal heart sounds and intact distal pulses.   Pulmonary/Chest: Effort normal and breath sounds normal.  Abdominal: Soft. Bowel sounds are normal. He exhibits no distension. There is no tenderness.  Few bruises of variable age on the patient's abdomen. No focal hematomas.  Musculoskeletal: Normal range of motion. He exhibits no edema.  Neurological: He is alert and oriented to person, place, and time. He has normal strength. Coordination normal. GCS eye subscore is 4. GCS verbal subscore is 5. GCS motor subscore is 6.  Skin: Skin is warm, dry and intact.  Psychiatric: He has a normal mood and affect.    ED Course  Procedures (including critical care time) Labs Review Labs Reviewed  COMPREHENSIVE METABOLIC PANEL - Abnormal; Notable for the following:    ALT 14 (*)    All other components within normal limits  CBC WITH DIFFERENTIAL/PLATELET - Abnormal; Notable for the following:    Platelets 47 (*)    Monocytes Relative 13 (*)    Monocytes Absolute 1.1 (*)    All other components within normal limits  URINALYSIS, ROUTINE W REFLEX MICROSCOPIC (NOT AT [facility]) - Abnormal; Notable for the following:    Hgb urine dipstick TRACE (*)    Ketones, ur 15 (*)    All other components within normal limits  IMMUNOFIXATION ELECTROPHORESIS, URINE (WITH TOT PROT) - Abnormal; Notable for the following:    Free Kappa/Lambda Ratio 1.98 (*)    All other components within normal limits  FIBRINOGEN - Abnormal; Notable for the following:    Fibrinogen 535 (*)    All other components within normal limits  TSH - Abnormal; Notable for the following:    TSH  8.802 (*)    All other components within normal limits  CBC - Abnormal; Notable for the following:    Platelets 46 (*)    All other components within normal limits  COMPREHENSIVE METABOLIC PANEL - Abnormal; Notable for the following:    Glucose, Bld 120 (*)    Total Protein 6.4 (*)    ALT 15 (*)    Total Bilirubin 1.3 (*)    All other components within normal limits  SEDIMENTATION RATE - Abnormal; Notable for the following:  Sed Rate 20 (*)    All other components within normal limits  C-REACTIVE PROTEIN - Abnormal; Notable for the following:    CRP 2.7 (*)    All other components within normal limits  BRAIN NATRIURETIC PEPTIDE - Abnormal; Notable for the following:    B Natriuretic Peptide 126.3 (*)    All other components within normal limits  CBC WITH DIFFERENTIAL/PLATELET - Abnormal; Notable for the following:    Platelets 51 (*)    All other components within normal limits  CBC - Abnormal; Notable for the following:    Platelets 46 (*)    All other components within normal limits  LIPASE, BLOOD  URINE MICROSCOPIC-ADD ON  RETICULOCYTES  FERRITIN  IRON AND TIBC  PLATELET ANTIBODY PROFILE, SERUM   VITAMIN B12  FOLATE RBC  HEPATITIS B SURFACE ANTIGEN  HAPTOGLOBIN  LACTATE DEHYDROGENASE  PROTEIN ELECTROPHORESIS, SERUM  HIV ANTIBODY (ROUTINE TESTING)  HEMOGLOBIN A1C  PROTIME-INR  APTT  SAVE SMEAR  T4, FREE  BONE MARROW EXAM  CHROMOSOME ANALYSIS, BONE MARROW  SURGICAL PATHOLOGY    Imaging Review Ct Biopsy  12/22/2014   CLINICAL DATA:  Thrombocytopenia, possible ITP versus myelodysplastic syndrome.  EXAM: CT GUIDED RIGHT ILIAC BONE MARROW ASPIRATION AND CORE BIOPSY  Date:  9/16/20169/16/2016 11:23 am  Radiologist:  M. Daryll Brod, MD  Guidance:  CT  FLUOROSCOPY TIME:  NONE.  MEDICATIONS AND MEDICAL HISTORY: 1 mg Versed, 50 mcg fentanyl  ANESTHESIA/SEDATION: 17 min  CONTRAST:  None  COMPLICATIONS: None  PROCEDURE: Informed consent was obtained from the patient  following explanation of the procedure, risks, benefits and alternatives. The patient understands, agrees and consents for the procedure. All questions were addressed. A time out was performed.  The patient was positioned prone and noncontrast localization CT was performed of the pelvis to demonstrate the iliac marrow spaces.  Maximal barrier sterile technique utilized including caps, mask, sterile gowns, sterile gloves, large sterile drape, hand hygiene, and betadine prep.  Under sterile conditions and local anesthesia, an 11 gauge coaxial bone biopsy needle was advanced into the right iliac marrow space. Needle position was confirmed with CT imaging. Initially, bone marrow aspiration was performed. Next, the 11 gauge outer cannula was utilized to obtain a right iliac bone marrow core biopsy. Needle was removed. Hemostasis was obtained with compression. The patient tolerated the procedure well. Samples were prepared with the cytotechnologist. No immediate complications.  IMPRESSION: CT guided right iliac bone marrow aspiration and core biopsy.   Electronically Signed   By: Jerilynn Mages.  Shick M.D.   On: 12/22/2014 12:09   I have personally reviewed and evaluated these images and lab results as part of my medical decision-making.   EKG Interpretation   Date/Time:  Tuesday December 19 2014 16:19:53 EDT Ventricular Rate:  88 PR Interval:  224 QRS Duration: 108 QT Interval:  392 QTC Calculation: 474 R Axis:   -149 Text Interpretation:  Sinus rhythm with 1st degree A-V block Low voltage  QRS Right bundle branch block Septal infarct , age undetermined Possible  Lateral infarct , age undetermined Abnormal ECG ED PHYSICIAN  INTERPRETATION AVAILABLE IN [facility] Confirmed by TEST, Record  ([id number]) on 12/20/2014 8:03:15 AM      MDM   Final diagnoses:  Pericardial effusion  History of cancer   Patient presents by referral from outpatient provider. He does have a history of lung cancer. The patient also  now has identified a pericardial effusion and significant thrombocytopenia. The patient is stable with clear  mental status and no restaurant stress. He will be admitted for further treatment and evaluation.    Charlesetta Shanks, MD 12/23/14 [phone]

## 2014-12-19 NOTE — ED Notes (Signed)
Pt having some concerns about bruises present on his left side of Abd, painful to touch. Pt denies any injury.

## 2014-12-20 ENCOUNTER — Inpatient Hospital Stay (HOSPITAL_COMMUNITY): Payer: Medicare Other

## 2014-12-20 ENCOUNTER — Encounter (HOSPITAL_COMMUNITY): Payer: Self-pay | Admitting: Surgery

## 2014-12-20 DIAGNOSIS — D696 Thrombocytopenia, unspecified: Principal | ICD-10-CM

## 2014-12-20 DIAGNOSIS — K219 Gastro-esophageal reflux disease without esophagitis: Secondary | ICD-10-CM

## 2014-12-20 DIAGNOSIS — I2510 Atherosclerotic heart disease of native coronary artery without angina pectoris: Secondary | ICD-10-CM

## 2014-12-20 DIAGNOSIS — J438 Other emphysema: Secondary | ICD-10-CM

## 2014-12-20 DIAGNOSIS — K449 Diaphragmatic hernia without obstruction or gangrene: Secondary | ICD-10-CM

## 2014-12-20 DIAGNOSIS — I10 Essential (primary) hypertension: Secondary | ICD-10-CM

## 2014-12-20 DIAGNOSIS — R0602 Shortness of breath: Secondary | ICD-10-CM

## 2014-12-20 DIAGNOSIS — I319 Disease of pericardium, unspecified: Secondary | ICD-10-CM

## 2014-12-20 LAB — COMPREHENSIVE METABOLIC PANEL
ALT: 15 U/L — AB (ref 17–63)
AST: 24 U/L (ref 15–41)
Albumin: 3.6 g/dL (ref 3.5–5.0)
Alkaline Phosphatase: 113 U/L (ref 38–126)
Anion gap: 9 (ref 5–15)
BILIRUBIN TOTAL: 1.3 mg/dL — AB (ref 0.3–1.2)
BUN: 12 mg/dL (ref 6–20)
CALCIUM: 9.2 mg/dL (ref 8.9–10.3)
CHLORIDE: 104 mmol/L (ref 101–111)
CO2: 26 mmol/L (ref 22–32)
CREATININE: 0.84 mg/dL (ref 0.61–1.24)
Glucose, Bld: 120 mg/dL — ABNORMAL HIGH (ref 65–99)
Potassium: 3.8 mmol/L (ref 3.5–5.1)
Sodium: 139 mmol/L (ref 135–145)
TOTAL PROTEIN: 6.4 g/dL — AB (ref 6.5–8.1)

## 2014-12-20 LAB — URINALYSIS, ROUTINE W REFLEX MICROSCOPIC
Bilirubin Urine: NEGATIVE
GLUCOSE, UA: NEGATIVE mg/dL
Ketones, ur: 15 mg/dL — AB
LEUKOCYTES UA: NEGATIVE
Nitrite: NEGATIVE
PH: 5 (ref 5.0–8.0)
Protein, ur: NEGATIVE mg/dL
SPECIFIC GRAVITY, URINE: 1.027 (ref 1.005–1.030)
Urobilinogen, UA: 0.2 mg/dL (ref 0.0–1.0)

## 2014-12-20 LAB — CBC
HEMATOCRIT: 43.8 % (ref 39.0–52.0)
Hemoglobin: 14.6 g/dL (ref 13.0–17.0)
MCH: 28 pg (ref 26.0–34.0)
MCHC: 33.3 g/dL (ref 30.0–36.0)
MCV: 83.9 fL (ref 78.0–100.0)
PLATELETS: 46 10*3/uL — AB (ref 150–400)
RBC: 5.22 MIL/uL (ref 4.22–5.81)
RDW: 15 % (ref 11.5–15.5)
WBC: 7.9 10*3/uL (ref 4.0–10.5)

## 2014-12-20 LAB — IRON AND TIBC
Iron: 95 ug/dL (ref 45–182)
Saturation Ratios: 23 % (ref 17.9–39.5)
TIBC: 405 ug/dL (ref 250–450)
UIBC: 310 ug/dL

## 2014-12-20 LAB — FIBRINOGEN: FIBRINOGEN: 535 mg/dL — AB (ref 204–475)

## 2014-12-20 LAB — SEDIMENTATION RATE: SED RATE: 20 mm/h — AB (ref 0–16)

## 2014-12-20 LAB — BRAIN NATRIURETIC PEPTIDE: B Natriuretic Peptide: 126.3 pg/mL — ABNORMAL HIGH (ref 0.0–100.0)

## 2014-12-20 LAB — URINE MICROSCOPIC-ADD ON

## 2014-12-20 LAB — C-REACTIVE PROTEIN: CRP: 2.7 mg/dL — ABNORMAL HIGH (ref <1.0)

## 2014-12-20 LAB — HIV ANTIBODY (ROUTINE TESTING W REFLEX): HIV Screen 4th Generation wRfx: NONREACTIVE

## 2014-12-20 LAB — LACTATE DEHYDROGENASE: LDH: 137 U/L (ref 98–192)

## 2014-12-20 LAB — RETICULOCYTES
RBC.: 5.3 MIL/uL (ref 4.22–5.81)
RETIC COUNT ABSOLUTE: 95.4 10*3/uL (ref 19.0–186.0)
Retic Ct Pct: 1.8 % (ref 0.4–3.1)

## 2014-12-20 LAB — VITAMIN B12: Vitamin B-12: 257 pg/mL (ref 180–914)

## 2014-12-20 LAB — PROTIME-INR
INR: 1.17 (ref 0.00–1.49)
PROTHROMBIN TIME: 15.1 s (ref 11.6–15.2)

## 2014-12-20 LAB — SAVE SMEAR

## 2014-12-20 LAB — APTT: aPTT: 32 seconds (ref 24–37)

## 2014-12-20 LAB — TSH: TSH: 8.802 u[IU]/mL — AB (ref 0.350–4.500)

## 2014-12-20 LAB — FERRITIN: Ferritin: 92 ng/mL (ref 24–336)

## 2014-12-20 MED ORDER — INFLUENZA VAC SPLIT QUAD 0.5 ML IM SUSY
0.5000 mL | PREFILLED_SYRINGE | INTRAMUSCULAR | Status: DC
  Filled 2014-12-20: qty 0.5

## 2014-12-20 MED ORDER — ACETAMINOPHEN 650 MG RE SUPP: 650.0000 mg | Freq: Four times a day (QID) | RECTAL | Status: DC | PRN

## 2014-12-20 MED ORDER — TEMAZEPAM 15 MG PO CAPS
30.0000 mg | ORAL_CAPSULE | Freq: Every evening | ORAL | Status: DC | PRN
  Administered 2014-12-20 – 2014-12-21 (×3): 30 mg via ORAL
  Filled 2014-12-20 (×3): qty 2

## 2014-12-20 MED ORDER — PANTOPRAZOLE SODIUM 40 MG PO TBEC
40.0000 mg | DELAYED_RELEASE_TABLET | Freq: Two times a day (BID) | ORAL | Status: DC
  Administered 2014-12-20 – 2014-12-22 (×6): 40 mg via ORAL
  Filled 2014-12-20 (×6): qty 1

## 2014-12-20 MED ORDER — METOPROLOL TARTRATE 12.5 MG HALF TABLET
12.5000 mg | ORAL_TABLET | Freq: Two times a day (BID) | ORAL | Status: DC
  Administered 2014-12-20 – 2014-12-21 (×5): 12.5 mg via ORAL
  Filled 2014-12-20 (×6): qty 1

## 2014-12-20 MED ORDER — VENLAFAXINE HCL ER 150 MG PO CP24
150.0000 mg | ORAL_CAPSULE | Freq: Every day | ORAL | Status: DC
  Administered 2014-12-20 – 2014-12-22 (×3): 150 mg via ORAL
  Filled 2014-12-20 (×3): qty 1

## 2014-12-20 MED ORDER — TAMSULOSIN HCL 0.4 MG PO CAPS
0.4000 mg | ORAL_CAPSULE | Freq: Every day | ORAL | Status: DC
  Administered 2014-12-20 – 2014-12-21 (×2): 0.4 mg via ORAL
  Filled 2014-12-20 (×2): qty 1

## 2014-12-20 MED ORDER — VITAMIN B-12 1000 MCG PO TABS
500.0000 ug | ORAL_TABLET | Freq: Every day | ORAL | Status: DC
  Administered 2014-12-20 – 2014-12-22 (×3): 500 ug via ORAL
  Filled 2014-12-20 (×3): qty 1

## 2014-12-20 MED ORDER — PERFLUTREN LIPID MICROSPHERE
1.0000 mL | INTRAVENOUS | Status: AC | PRN
  Administered 2014-12-20: 2 mL via INTRAVENOUS
  Filled 2014-12-20: qty 10

## 2014-12-20 MED ORDER — ALBUTEROL SULFATE (2.5 MG/3ML) 0.083% IN NEBU: 3.0000 mL | INHALATION_SOLUTION | Freq: Four times a day (QID) | RESPIRATORY_TRACT | Status: DC | PRN

## 2014-12-20 MED ORDER — ATORVASTATIN CALCIUM 20 MG PO TABS
20.0000 mg | ORAL_TABLET | Freq: Every day | ORAL | Status: DC
  Administered 2014-12-20 – 2014-12-22 (×3): 20 mg via ORAL
  Filled 2014-12-20 (×3): qty 1

## 2014-12-20 MED ORDER — ACETAMINOPHEN 325 MG PO TABS
650.0000 mg | ORAL_TABLET | Freq: Four times a day (QID) | ORAL | Status: DC | PRN
Start: 2014-12-20 — End: 2014-12-22
  Administered 2014-12-20 – 2014-12-21 (×3): 650 mg via ORAL
  Filled 2014-12-20 (×3): qty 2

## 2014-12-20 MED ORDER — BUPROPION HCL ER (SR) 150 MG PO TB12
150.0000 mg | ORAL_TABLET | Freq: Every day | ORAL | Status: DC
  Administered 2014-12-20 – 2014-12-22 (×3): 150 mg via ORAL
  Filled 2014-12-20 (×3): qty 1

## 2014-12-20 MED ORDER — SODIUM CHLORIDE 0.9 % IV SOLN
INTRAVENOUS | Status: DC
  Administered 2014-12-20: 01:00:00 via INTRAVENOUS

## 2014-12-20 MED ORDER — SODIUM CHLORIDE 0.9 % IJ SOLN
3.0000 mL | Freq: Two times a day (BID) | INTRAMUSCULAR | Status: DC
  Administered 2014-12-20 – 2014-12-22 (×5): 3 mL via INTRAVENOUS

## 2014-12-20 NOTE — Consult Note (Signed)
CONSULTATION NOTE  Reason for Consult: Pericardial effusion  Requesting Physician: Dr. Arbutus Leas  Cardiologist: Dr. Anne Fu  HPI: This is a 80 y.o. male with a past medical history significant for coronary artery disease status post multiple PCI's in the past, the last being in 2001 where he underwent circumflex stent for lateral wall MI and had brachytherapy and angioplasty. In 2011 he developed pericardial effusion as a result of cancer. He underwent pericardial window by Dr. Donata Clay which was a 3 x 3" removal of the anterior apical pericardium, however no malignant cells were noted in the pericardium. He did have early tamponade features at that time. He does have a history of radiation therapy to the chest for non-small cell CA. Numerous CTs over the past 2 years have demonstrated a stable small pericardial effusion which was recently read as small to moderate on the 11/21/2014 scan. There is also small pleural effusions. He also has COPD managed by Dr. Delton Coombes in the past but he has not seen him in a while and is not currently on inhalers. He also tells me he has a hiatal hernia which she feels may be contributing to his chest fullness and abdominal discomfort.. Cardiology is asked to consult regarding positional shortness of breath and whether this is related to pericardial effusion. Currently he denies any chest pain. He was also found to be thrombocytopenic on admission.  PMHx:  Past Medical History  Diagnosis Date  . IHD (ischemic heart disease)     Remote MI in 2001 with stent to the LCX  . Hypertension   . Hyperlipidemia   . SOB (shortness of breath)     Chronic with known fibrosis  . Hypokinesia     MILD INFERIOR  . GERD (gastroesophageal reflux disease)   . Lactose intolerance   . Diverticulosis   . Hearing loss   . Pleural effusion, left   . Pericardial effusion July 2011    s/p pericardial window; last echo in August 2012 with minimal effusion  . Normal nuclear stress  test 2009    EF 63%. No ischemia. 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  . Arthritis     knees  . Anxiety     takes wellbutrin daily  . Hx of radiation therapy 09/29/11;10/01/11;10/03/11;10/06/11;10/08/11;    RLLlung,50Gy/63fx - SBRT  . Status post chemotherapy 06/04/2009 -  09/06/2009     4 cycles of Cisplatin and Gemcitabine with Neulasta Support   . Non-small cell carcinoma of lung     Dx in October of 2010   Past Surgical History  Procedure Laterality Date  . Pericardial window  July 2011  . Cardiovascular stress test  05/2007    EF 63%  . Coronary stent placement  07/1999    STENT TO THE LEFT CIRCUMFLEX  . Cardiac catheterization  07/11/2003    EF 60%, REPEAT, WHICH SHOWED 30% PROXIMAL NARROWING IN THE RIGHT  CORONARY ARTERY  . Transthoracic echocardiogram  02/22/2010    EF 55-60%  . Hernia repair      as an adult- R inguinal     FAMHx: Family History  Problem Relation Age of Onset  . Lung cancer Father   . Heart disease Neg Hx   . Anesthesia problems Neg Hx     SOCHx:  reports that he quit smoking about 20 years ago. His smoking use included Cigarettes. He has a 120 pack-year smoking history. He has never used smokeless tobacco. He reports that he does not drink alcohol or  use illicit drugs.  ALLERGIES: Allergies  Allergen Reactions  . Sertraline Hcl Other (See Comments)    Patient does not remember the reaction    ROS: A comprehensive review of systems was negative except for: Respiratory: positive for dyspnea on exertion Gastrointestinal: positive for abdominal pain Integument/breast: positive for skin color change  HOME MEDICATIONS: Prescriptions prior to admission  Medication Sig Dispense Refill Last Dose  . atorvastatin (LIPITOR) 20 MG tablet TAKE 1 TABLET BY MOUTH DAILY 90 tablet 1 12/18/2014 at Unknown time  . buPROPion (WELLBUTRIN SR) 150 MG 12 hr tablet Take 150 mg by mouth daily.    12/18/2014 at venlaf  . clopidogrel (PLAVIX) 75 MG tablet TAKE 1 TABLET BY MOUTH DAILY 30  tablet 5 12/18/2014 at Unknown time  . metoprolol tartrate (LOPRESSOR) 25 MG tablet Take 1/2 tablet twice a day 45 tablet 3 12/18/2014 at 1800  . pantoprazole (PROTONIX) 40 MG tablet TAKE 1 TABLET BY MOUTH TWICE DAILY 60 tablet 0 12/18/2014 at Unknown time  . Tamsulosin HCl (FLOMAX) 0.4 MG CAPS Take 0.4 mg by mouth daily with supper.    12/18/2014 at Unknown time  . temazepam (RESTORIL) 30 MG capsule Take 30 mg by mouth at bedtime as needed for sleep.   12/18/2014 at Unknown time  . venlafaxine XR (EFFEXOR-XR) 150 MG 24 hr capsule Take 1 capsule by mouth daily.  1 12/18/2014 at Unknown time    HOSPITAL MEDICATIONS: I have reviewed the patient's current medications.  VITALS: Blood pressure 120/78, pulse 83, temperature 97.5 F (36.4 C), temperature source Oral, resp. rate 18, height _0  (1.778 m), weight 190 lb 4.8 oz (86.32 kg), SpO2 100 %.  PHYSICAL EXAM: General appearance: alert, no distress and On oxygen by nasal cannula Neck: no carotid bruit and no JVD Lungs: diminished breath sounds bibasilar and wheezes In the upper airway on expiration Heart: regular rate and rhythm Abdomen: Soft, mild tender to palpation, mild ecchymosis and petechiae Extremities: extremities normal, atraumatic, no cyanosis or edema Pulses: 2+ and symmetric Skin: Mild ecchymotic areas with petechia Neurologic: Grossly normal Psych: Pleasant  LABS: Results for orders placed or performed during the hospital encounter of 12/19/14 (from the past 48 hour(s))  Lipase, blood     Status: None   Collection Time: 12/19/14  4:51 PM  Result Value Ref Range   Lipase 36 22 - 51 U/L  Comprehensive metabolic panel     Status: Abnormal   Collection Time: 12/19/14  4:51 PM  Result Value Ref Range   Sodium 138 135 - 145 mmol/L   Potassium 4.1 3.5 - 5.1 mmol/L   Chloride 103 101 - 111 mmol/L   CO2 26 22 - 32 mmol/L   Glucose, Bld 93 65 - 99 mg/dL   BUN 15 6 - 20 mg/dL   Creatinine, Ser 0.93 0.61 - 1.24 mg/dL   Calcium  9.5 8.9 - 10.3 mg/dL   Total Protein 7.4 6.5 - 8.1 g/dL   Albumin 4.1 3.5 - 5.0 g/dL   AST 19 15 - 41 U/L   ALT 14 (L) 17 - 63 U/L   Alkaline Phosphatase 123 38 - 126 U/L   Total Bilirubin 1.2 0.3 - 1.2 mg/dL   GFR calc non Af Amer >60 >60 mL/min   GFR calc Af Amer >60 >60 mL/min    Comment: (NOTE) The eGFR has been calculated using the CKD EPI equation. This calculation has not been validated in all clinical situations. eGFR's persistently <60 mL/min signify possible  Chronic Kidney Disease.    Anion gap 9 5 - 15  CBC with Differential     Status: Abnormal   Collection Time: 12/19/14  4:51 PM  Result Value Ref Range   WBC 8.3 4.0 - 10.5 K/uL   RBC 5.26 4.22 - 5.81 MIL/uL   Hemoglobin 15.0 13.0 - 17.0 g/dL   HCT 44.4 39.0 - 52.0 %   MCV 84.4 78.0 - 100.0 fL   MCH 28.5 26.0 - 34.0 pg   MCHC 33.8 30.0 - 36.0 g/dL   RDW 14.9 11.5 - 15.5 %   Platelets 47 (L) 150 - 400 K/uL    Comment: PLATELET COUNT CONFIRMED BY SMEAR   Neutrophils Relative % 68 43 - 77 %   Neutro Abs 5.7 1.7 - 7.7 K/uL   Lymphocytes Relative 16 12 - 46 %   Lymphs Abs 1.3 0.7 - 4.0 K/uL   Monocytes Relative 13 (H) 3 - 12 %   Monocytes Absolute 1.1 (H) 0.1 - 1.0 K/uL   Eosinophils Relative 2 0 - 5 %   Eosinophils Absolute 0.2 0.0 - 0.7 K/uL   Basophils Relative 1 0 - 1 %   Basophils Absolute 0.1 0.0 - 0.1 K/uL  Urinalysis, Routine w reflex microscopic     Status: Abnormal   Collection Time: 12/19/14 11:30 PM  Result Value Ref Range   Color, Urine YELLOW YELLOW   APPearance CLEAR CLEAR   Specific Gravity, Urine 1.027 1.005 - 1.030   pH 5.0 5.0 - 8.0   Glucose, UA NEGATIVE NEGATIVE mg/dL   Hgb urine dipstick TRACE (A) NEGATIVE   Bilirubin Urine NEGATIVE NEGATIVE   Ketones, ur 15 (A) NEGATIVE mg/dL   Protein, ur NEGATIVE NEGATIVE mg/dL   Urobilinogen, UA 0.2 0.0 - 1.0 mg/dL   Nitrite NEGATIVE NEGATIVE   Leukocytes, UA NEGATIVE NEGATIVE  Urine microscopic-add on     Status: None   Collection Time:  12/19/14 11:30 PM  Result Value Ref Range   WBC, UA 0-2 <3 WBC/hpf   RBC / HPF 3-6 <3 RBC/hpf  Reticulocytes     Status: None   Collection Time: 12/20/14  1:12 AM  Result Value Ref Range   Retic Ct Pct 1.8 0.4 - 3.1 %   RBC. 5.30 4.22 - 5.81 MIL/uL   Retic Count, Manual 95.4 19.0 - 186.0 K/uL  Ferritin     Status: None   Collection Time: 12/20/14  1:12 AM  Result Value Ref Range   Ferritin 92 24 - 336 ng/mL  Iron and TIBC     Status: None   Collection Time: 12/20/14  1:12 AM  Result Value Ref Range   Iron 95 45 - 182 ug/dL   TIBC 405 250 - 450 ug/dL   Saturation Ratios 23 17.9 - 39.5 %   UIBC 310 ug/dL  Vitamin B12     Status: None   Collection Time: 12/20/14  1:12 AM  Result Value Ref Range   Vitamin B-12 257 180 - 914 pg/mL    Comment: (NOTE) This assay is not validated for testing neonatal or myeloproliferative syndrome specimens for Vitamin B12 levels.   Lactate dehydrogenase     Status: None   Collection Time: 12/20/14  1:12 AM  Result Value Ref Range   LDH 137 98 - 192 U/L  Fibrinogen     Status: Abnormal   Collection Time: 12/20/14  1:12 AM  Result Value Ref Range   Fibrinogen 535 (H) 204 - 475 mg/dL  TSH     Status: Abnormal   Collection Time: 12/20/14  1:12 AM  Result Value Ref Range   TSH 8.802 (H) 0.350 - 4.500 uIU/mL  Protime-INR     Status: None   Collection Time: 12/20/14  3:38 AM  Result Value Ref Range   Prothrombin Time 15.1 11.6 - 15.2 seconds   INR 1.17 0.00 - 1.49  APTT     Status: None   Collection Time: 12/20/14  3:38 AM  Result Value Ref Range   aPTT 32 24 - 37 seconds  CBC     Status: Abnormal   Collection Time: 12/20/14  3:38 AM  Result Value Ref Range   WBC 7.9 4.0 - 10.5 K/uL   RBC 5.22 4.22 - 5.81 MIL/uL   Hemoglobin 14.6 13.0 - 17.0 g/dL   HCT 43.8 39.0 - 52.0 %   MCV 83.9 78.0 - 100.0 fL   MCH 28.0 26.0 - 34.0 pg   MCHC 33.3 30.0 - 36.0 g/dL   RDW 15.0 11.5 - 15.5 %   Platelets 46 (L) 150 - 400 K/uL    Comment: CONSISTENT  WITH PREVIOUS RESULT  Comprehensive metabolic panel     Status: Abnormal   Collection Time: 12/20/14  3:38 AM  Result Value Ref Range   Sodium 139 135 - 145 mmol/L   Potassium 3.8 3.5 - 5.1 mmol/L   Chloride 104 101 - 111 mmol/L   CO2 26 22 - 32 mmol/L   Glucose, Bld 120 (H) 65 - 99 mg/dL   BUN 12 6 - 20 mg/dL   Creatinine, Ser 0.84 0.61 - 1.24 mg/dL   Calcium 9.2 8.9 - 10.3 mg/dL   Total Protein 6.4 (L) 6.5 - 8.1 g/dL   Albumin 3.6 3.5 - 5.0 g/dL   AST 24 15 - 41 U/L   ALT 15 (L) 17 - 63 U/L   Alkaline Phosphatase 113 38 - 126 U/L   Total Bilirubin 1.3 (H) 0.3 - 1.2 mg/dL   GFR calc non Af Amer >60 >60 mL/min   GFR calc Af Amer >60 >60 mL/min    Comment: (NOTE) The eGFR has been calculated using the CKD EPI equation. This calculation has not been validated in all clinical situations. eGFR's persistently <60 mL/min signify possible Chronic Kidney Disease.    Anion gap 9 5 - 15    IMAGING: No results found.  HOSPITAL DIAGNOSES: Principal Problem:   Thrombocytopenia Active Problems:   COPD (chronic obstructive pulmonary disease) with emphysema   Pericardial effusion   CAD (coronary artery disease)   Hyperlipidemia   HTN (hypertension)   GERD (gastroesophageal reflux disease)   IMPRESSION: 1. Chronic pericardial effusion 2. Progressive dyspnea on exertion 3. Thrombocytopenia 4. History of lung cancer  RECOMMENDATION: 1. [name] Fields has a chronic pericardial effusion which is been at least small over the past year or 2 and recently more moderate in size. It is questionable as to whether or not this is contributing to his positional shortness of breath or perhaps whether it's related to hiatal hernia or underlying COPD. I agree with checking an echocardiogram to look for any concerning features such as tamponade physiology. There is no clinical evidence of this on exam. I will also check a sedimentation rate and CRP which should be negative as he does not have active  cancer or infection that were aware of, unless he has active pericardial inflammation. While its possible to have recurrence of pericardial effusion after pericardial window, it is  much less common and typically indicates some degree of loculation or scarring which is holding the pericardial fluid. I do not suspect the symptoms are due to angina as he is historically had some degree of chest pain with obstructive coronary artery disease.  Thank you for the consultation. My findings were discussed directly with Dr. Carles Collet.  Time Spent Directly with Patient: 45 minutes  Pixie Casino, MD, [facility] Attending Cardiologist [place] 12/20/2014, 8:35 AM

## 2014-12-20 NOTE — Progress Notes (Signed)
PROGRESS NOTE  [name] Fields PNT:[id number] DOB: June 26, 1934 DOA: 12/19/2014 PCP: [name], MD  Brief history 80 year old male with history of hypertension, hyperlipidemia, pericardial window July 2011, CAD with history of stent in the circumflex, non-small cell lung cancer presented with abdominal wall bruising for the past 2 months. The patient went to see his primary care provider on 12/19/2014. Blood work revealed that he was [name] Fields had repeated. As result, the patient was injected into the emergency department. Patient denies any recent injuries or falls. He denies any new medications. He denies any NSAIDs. He does not take any over-the-counter medications. Patient had CT chest on 11/21/2014 which revealed moderate pericardial effusion. The patient's biggest complaint is that of chest discomfort and shortness of breath/dyspnea on exertion which he has minimized his family for the past 2 months. He states that his chest discomfort is worse with supine position. Assessment/Plan: Thrombocytopenia -Suspect this may be multifactorial including possible ITP, low serum B12 -Serum B12--257-->start supplement (hesitate to use IM due to low platelets) -RBC folate pending -INR 1.17, PTT 32 -Fibrinogen 535 -Abdominal ultrasound to evaluate for hepatosplenomegaly -peripheral smear -LDH 137 -haptoglobin pending -HIV pending -SPEP pending -I have consulted his heme/onc physician--spoke with [name] Fields Pericardial effusion -Repeat echocardiogram -I am concerned that this may be contributing to the patient's chest discomfort when he is laying supine and as well as his dyspnea on exertion -I have consulted cardiology -hx of previous pericardial window 10/25/2009--no malignant cells CAD without angina -April of 2001, lateral wall MI, circumflex stent with brachii therapy and angioplasty of stent in 2001 -plavix on hold due to thrombocytopenia Elevated TSH -Check free  T4 NSCLC -last XRT on 10/08/11 -last chemo ~39yrago -Ennever consulted Hypertension -Continue metoprolol tartrate Hyperlipidemia -Continue statin Depression -Continue Effexor and Wellbutrin    Family Communication:   Pt at beside Disposition Plan:   Home 2-3 days pending workup     Procedures/Studies: Ct Chest Wo Contrast  11/21/2014   CLINICAL DATA:  Lung cancer diagnosed in October 2010. Restaging post completion of chemotherapy and radiation therapy in 2011. Shortness of breath. Initial encounter.  EXAM: CT CHEST WITHOUT CONTRAST  TECHNIQUE: Multidetector CT imaging of the chest was performed following the standard protocol without IV contrast.  COMPARISON:  CTs 05/15/2014 and 11/08/2013.  FINDINGS: Mediastinum/Nodes: Mediastinal and bilateral hilar distortion appears unchanged. No discretely enlarged lymph nodes are identified. Hilar assessment is limited by the lack of contrast, although the hilar contours appear unchanged. The thyroid gland, trachea and esophagus demonstrate no significant findings. The heart size is stable. There is a stable moderate size pericardial effusion. Atherosclerosis and coronary artery stents are noted. Right IJ Port-A-Cath tip is at the SVC right atrial junction.  Lungs/Pleura: Pleural thickening and a small amount of loculated pleural fluid inferiorly on the left are stable. There is no significant right pleural effusion. There is stable volume loss in the left hemithorax with paramediastinal radiation changes, hilar distortion and calcifications. Infrahilar radiation changes extending posteriorly in the right lower lobe are also stable. Scattered small nodules, including a 7 mm subpleural lingular nodule on image 23 are stable.  Upper abdomen: Small gallstones and a small nonobstructing left renal calculus noted. The visualized liver and adrenal glands demonstrate no suspicious findings.  Musculoskeletal/Chest wall: There is no chest wall mass or suspicious  osseous finding.  IMPRESSION: 1. Stable appearance of the chest with radiation changes and hilar distortion bilaterally. 2. Scattered small  pulmonary nodules bilaterally are stable. No suspicious or enlarging nodules demonstrated. 3. Stable left pleural thickening and loculated pleural fluid and moderate pericardial effusion.   Electronically Signed   By: Richardean Sale M.D.   On: 11/21/2014 16:29         Subjective: Patient complains of dyspnea on exertion as well as chest discomfort when he lies flat. Denies any fevers, chills, nausea, vomiting, diarrhea, abdominal pain, dysuria, hematuria. There is no hematochezia, melena, hemoptysis, hematuria.   Objective: Filed Vitals:   12/20/14 0015 12/20/14 0052 12/20/14 0508 12/20/14 0805  BP: 133/76 137/83 128/75 120/78  Pulse: 92 84 77 83  Temp:  97.7 F (36.5 C) 97.6 F (36.4 C) 97.5 F (36.4 C)  TempSrc:  Oral Oral Oral  Resp: '20 22 18 18  '$ Height:  '5\' 10"'$  (1.778 m)    Weight:  86.32 kg (190 lb 4.8 oz)    SpO2: 95% 99% 99% 100%    Intake/Output Summary (Last 24 hours) at 12/20/14 0810 Last data filed at 12/20/14 0806  Gross per 24 hour  Intake  260.5 ml  Output    300 ml  Net  -39.5 ml   Weight change:  Exam:   General:  Pt is alert, follows commands appropriately, not in acute distress  HEENT: No icterus, No thrush, No neck mass, [place]/AT  Cardiovascular: RRR, S1/S2, no rubs, no gallops, no JVD  Respiratory: CTA bilaterally, no wheezing, no crackles, no rhonchi  Abdomen: Soft/+BS, non tender, non distended, no guarding  Extremities: No edema, No lymphangitis, No petechiae, No rashes, no synovitis  Data Reviewed: Basic Metabolic Panel:  Recent Labs Lab 12/19/14 1651 12/20/14 0338  NA 138 139  K 4.1 3.8  CL 103 104  CO2 26 26  GLUCOSE 93 120*  BUN 15 12  CREATININE 0.93 0.84  CALCIUM 9.5 9.2   Liver Function Tests:  Recent Labs Lab 12/19/14 1651 12/20/14 0338  AST 19 24  ALT 14* 15*  ALKPHOS 123 113    BILITOT 1.2 1.3*  PROT 7.4 6.4*  ALBUMIN 4.1 3.6    Recent Labs Lab 12/19/14 1651  LIPASE 36   No results for input(s): AMMONIA in the last 168 hours. CBC:  Recent Labs Lab 12/19/14 1651 12/20/14 0338  WBC 8.3 7.9  NEUTROABS 5.7  --   HGB 15.0 14.6  HCT 44.4 43.8  MCV 84.4 83.9  PLT 47* 46*   Cardiac Enzymes: No results for input(s): CKTOTAL, CKMB, CKMBINDEX, TROPONINI in the last 168 hours. BNP: Invalid input(s): POCBNP CBG: No results for input(s): GLUCAP in the last 168 hours.  No results found for this or any previous visit (from the past 240 hour(s)).   Scheduled Meds: . atorvastatin  20 mg Oral Daily  . buPROPion  150 mg Oral Daily  . [START ON 12/21/2014] Influenza vac split quadrivalent PF  0.5 mL Intramuscular Tomorrow-1000  . metoprolol tartrate  12.5 mg Oral BID  . pantoprazole  40 mg Oral BID  . sodium chloride  3 mL Intravenous Q12H  . tamsulosin  0.4 mg Oral Q supper  . venlafaxine XR  150 mg Oral Daily   Continuous Infusions: . sodium chloride 50 mL/hr at 12/20/14 0051     Montserrat Shek, DO  [facility] Pager [phone]  If 7PM-7AM, please contact night-coverage [email or web address] Password [email or web address] 12/20/2014, 8:10 AM   LOS: 1 day

## 2014-12-20 NOTE — Progress Notes (Signed)
Patient transferred from the ED via stretcher to room 3E12. VSS. Oriented and educated patient to heart failure floor and to room equipment. Currently patient complains of no pain or discomfort. Will continue to monitor patient to end of shift.

## 2014-12-20 NOTE — Progress Notes (Signed)
[place]  [phone]   Patient Care Team: [name], MD as PCP - General (Internal Medicine) Jeanie Cooks, MD as Referring Physician (Hematology and Oncology) Collene Gobble, MD (Pulmonary Disease)  HOSPITAL CONSULT  NOTE  HPI: 80 year old man with a history of lung Ca and melanoma of the left face as detailed below, Admitted with increased bruising. He was found to have low platelets 47,000. CT of the chest noted patient to have Persistent pericardial effusion. He denies any other social associated symptoms such as  fevers, chills, night sweats, vision changes, or mucositis Denies nausea, heartburn or change in bowel habits. Denies epistaxis, hematemesis, hematuria or hematochezia. Ambulating without difficulty. The patient was on Plavix, which was held.  Iron studies were normal. B-12 was 257, requiring replenishment. LDH is 137. Haptoglobin is pending. HIV and SPEP are pending. Peripheral blood smear has been ordered for review. INR on admission was 1.17, with a PTT of 32 and fibrinogen 535. Platelet antibody profile ANA, retic count 95.4, Hep B ag pendign, LDH normal at 137, Haptoglobin pending. 2-D echo is pending. 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 atorvastatin  20 mg Oral Daily  . buPROPion  150 mg Oral Daily  . [START ON 12/21/2014] Influenza vac split quadrivalent PF  0.5 mL Intramuscular Tomorrow-1000  . metoprolol tartrate  12.5 mg Oral BID  . pantoprazole  40 mg Oral BID  . sodium chloride  3 mL Intravenous Q12H  . tamsulosin  0.4 mg Oral Q supper  . venlafaxine XR  150 mg Oral Daily  . vitamin B-12  500 mcg Oral  Daily   Continuous Infusions:  PRN Meds:.acetaminophen **OR** acetaminophen, albuterol, temazepam ALLERGIES:   Allergies  Allergen Reactions  . Sertraline Hcl Other (See Comments)    Patient does not remember the reaction     PHYSICAL EXAMINATION:  Filed Vitals:   12/20/14 1221  BP: 128/58  Pulse: 66  Temp: 97.4 F (36.3 C)  Resp: 18   Filed Weights   12/20/14 0052  Weight: 190 lb 4.8 oz (86.32 kg)    GENERAL:alert, no distress and comfortable. He slightly dyspneic when he is lying flat. SKIN: skin color, texture, turgor are normal, no rashes or significant lesions EYES: normal, conjunctiva are pink and non-injected, sclera clear OROPHARYNX:no exudate, no erythema and lips, buccal mucosa, and tongue normal  NECK: supple, thyroid normal size, non-tender, without nodularity LYMPH:  no palpable lymphadenopathy in the cervical, axillary or inguinal LUNGS: clear to auscultation and percussion with normal breathing effort HEART: regular rate & rhythm and no murmurs and no lower extremity edema ABDOMEN: soft, non-tender and normal bowel sounds Musculoskeletal:no cyanosis of digits and no clubbing  PSYCH: alert & oriented x 3 with fluent speech NEURO: no focal motor/sensory deficits   LABORATORY/RADIOLOGY DATA:   Recent Labs Lab 12/19/14 1651 12/20/14 0338  WBC 8.3 7.9  HGB 15.0 14.6  HCT 44.4 43.8  PLT 47* 46*  MCV 84.4 83.9  MCH 28.5 28.0  MCHC 33.8 33.3  RDW 14.9 15.0  LYMPHSABS 1.3  --   MONOABS 1.1*  --   EOSABS 0.2  --   BASOSABS 0.1  --  CMP    Recent Labs Lab 12/19/14 1651 12/20/14 0338  NA 138 139  K 4.1 3.8  CL 103 104  CO2 26 26  GLUCOSE 93 120*  BUN 15 12  CREATININE 0.93 0.84  CALCIUM 9.5 9.2  AST 19 24  ALT 14* 15*  ALKPHOS 123 113  BILITOT 1.2 1.3*        Component Value Date/Time   BILITOT 1.3* 12/20/2014 0338   BILITOT 0.70 07/06/2014 1331   BILITOT 1.14 07/18/2013 0851    Anemia panel:    Recent Labs   12/20/14 0112  VITAMINB12 257  FERRITIN 92  TIBC 405  IRON 95  RETICCTPCT 1.8     Recent Labs  12/20/14 0112  TSH 8.802*        Component Value Date/Time   ESRSEDRATE 20* 12/20/2014 1052     Recent Labs Lab 12/20/14 0338  INR 1.17      Urinalysis    Component Value Date/Time   COLORURINE YELLOW 12/19/2014 2330   APPEARANCEUR CLEAR 12/19/2014 2330   LABSPEC 1.027 12/19/2014 2330   PHURINE 5.0 12/19/2014 2330   GLUCOSEU NEGATIVE 12/19/2014 2330   HGBUR TRACE* 12/19/2014 2330   BILIRUBINUR NEGATIVE 12/19/2014 2330   KETONESUR 15* 12/19/2014 2330   PROTEINUR NEGATIVE 12/19/2014 2330   UROBILINOGEN 0.2 12/19/2014 2330   NITRITE NEGATIVE 12/19/2014 2330   LEUKOCYTESUR NEGATIVE 12/19/2014 2330   Liver Function Tests:  Recent Labs Lab 12/19/14 1651 12/20/14 0338  AST 19 24  ALT 14* 15*  ALKPHOS 123 113  BILITOT 1.2 1.3*  PROT 7.4 6.4*  ALBUMIN 4.1 3.6    Recent Labs Lab 12/19/14 1651  LIPASE 36    Thyroid function studies  Recent Labs  12/20/14 0112  TSH 8.802*    Radiology Studies:  Ct Chest Wo Contrast  11/21/2014   CLINICAL DATA:  Lung cancer diagnosed in October 2010. Restaging post completion of chemotherapy and radiation therapy in 2011. Shortness of breath. Initial encounter.  EXAM: CT CHEST WITHOUT CONTRAST  TECHNIQUE: Multidetector CT imaging of the chest was performed following the standard protocol without IV contrast.  COMPARISON:  CTs 05/15/2014 and 11/08/2013.  FINDINGS: Mediastinum/Nodes: Mediastinal and bilateral hilar distortion appears unchanged. No discretely enlarged lymph nodes are identified. Hilar assessment is limited by the lack of contrast, although the hilar contours appear unchanged. The thyroid gland, trachea and esophagus demonstrate no significant findings. The heart size is stable. There is a stable moderate size pericardial effusion. Atherosclerosis and coronary artery stents are noted. Right IJ Port-A-Cath tip is  at the SVC right atrial junction.  Lungs/Pleura: Pleural thickening and a small amount of loculated pleural fluid inferiorly on the left are stable. There is no significant right pleural effusion. There is stable volume loss in the left hemithorax with paramediastinal radiation changes, hilar distortion and calcifications. Infrahilar radiation changes extending posteriorly in the right lower lobe are also stable. Scattered small nodules, including a 7 mm subpleural lingular nodule on image 23 are stable.  Upper abdomen: Small gallstones and a small nonobstructing left renal calculus noted. The visualized liver and adrenal glands demonstrate no suspicious findings.  Musculoskeletal/Chest wall: There is no chest wall mass or suspicious osseous finding.  IMPRESSION: 1. Stable appearance of the chest with radiation changes and hilar distortion bilaterally. 2. Scattered small pulmonary nodules bilaterally are stable. No suspicious or enlarging nodules demonstrated. 3. Stable left pleural thickening and loculated pleural fluid and moderate pericardial effusion.   Electronically Signed   By:  Richardean Sale M.D.   On: 11/21/2014 16:29   US Abdomen Complete  12/20/2014   CLINICAL DATA:  Thrombocytopenia, ischemic heart disease, hypertension, GERD, non-small-cell lung carcinoma post chemotherapy and radiation therapy  EXAM: ULTRASOUND ABDOMEN COMPLETE  COMPARISON:  PET-CT 08/05/2011  FINDINGS: Gallbladder: Normally distended without stones or wall thickening. No pericholecystic fluid or sonographic Murphy sign.  Common bile duct: Diameter: 3 mm diameter , normal  Liver: Echogenic, likely fatty infiltration, though this can be seen with cirrhosis and certain infiltrative disorders. No focal hepatic mass nodularity. Hepatopetal portal venous flow.  IVC: Normal appearance  Pancreas: Inadequately visualized due to bowel gas.  Spleen: Normal appearance, 4.7 cm length  Right Kidney: Length: 10.4 cm. Cortical thinning. Normal  cortical echogenicity. No mass or hydronephrosis.  Left Kidney: Length: 10.3 cm. Cortical thinning. Normal cortical echogenicity. No mass or hydronephrosis.  Abdominal aorta: Normal caliber  Other findings: No free fluid  IMPRESSION: Probable fatty infiltration of liver as above.  Inadequate pancreatic visualization.  Age-related renal cortical atrophy without gross renal mass or hydronephrosis.   Electronically Signed   By: Lavonia Dana M.D.   On: 12/20/2014 15:35       ASSESSMENT AND PLAN:   Thrombocytopenia Rule out ITP No bleeding issues are noted Workup is in progress Monitor counts closely Transfuse 1 unit of platelets if count is less or equal than 10,000 or 20,000 if the patient is acutely bleeding Hold Lovenox if  platelets drop to less than 50,000 Will follow  Pericardial effusion 2 D echo is pending Cardiology involved Of note he has a history of prior pericardial window on 10/2009, negative for malignancy  Non Small cell carcinoma CT of the chest shows stable lymph nodes, without new masses On observation  Full Code  Other medical issues as per admitting team   [facility] E, PA-C 12/20/2014, 3:47 PM  ADDENDUM:  I saw and examined the patient on the morning of September 15.  I looked at his smear. He had large platelets. There were no schistocytes or spherocytes. I did not see any blasts. There were no hypersegmented polys. He had no atypical lymphocytes.  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  He has had no new medications added. He has had no foreign travel.  He has not noted any swollen nodes. He has had some abdominal discomfort.  He will need to have a bone marrow biopsy done. At his age, and the fact that he received chemotherapy in the past, he is at risk for myelodysplasia. The only way to identify this is with a bone marrow biopsy.  He had a ultrasound done yesterday. This showed a normal spleen. There is no lymphadenopathy. Liver  looked okay.  He is not bleeding. His platelet count is moderately depressed.  i would not put him on any therapy for right now. I believe that a bone marrow biopsy will be needed. We will have radiology do this for [place].  I appreciate the great care that he is receiving over on 3 E.  We will follow along. I spent about 45 minutes with him this morning.  Pete E.  Isaiah 12:2

## 2014-12-20 NOTE — Progress Notes (Signed)
  Echocardiogram 2D Echocardiogram has been performed.  [name] Fields M 12/20/2014, 12:32 PM

## 2014-12-21 DIAGNOSIS — C3490 Malignant neoplasm of unspecified part of unspecified bronchus or lung: Secondary | ICD-10-CM

## 2014-12-21 DIAGNOSIS — I313 Pericardial effusion (noninflammatory): Secondary | ICD-10-CM

## 2014-12-21 DIAGNOSIS — E785 Hyperlipidemia, unspecified: Secondary | ICD-10-CM

## 2014-12-21 DIAGNOSIS — C3412 Malignant neoplasm of upper lobe, left bronchus or lung: Secondary | ICD-10-CM

## 2014-12-21 DIAGNOSIS — C3492 Malignant neoplasm of unspecified part of left bronchus or lung: Secondary | ICD-10-CM

## 2014-12-21 DIAGNOSIS — J90 Pleural effusion, not elsewhere classified: Secondary | ICD-10-CM

## 2014-12-21 DIAGNOSIS — I25111 Atherosclerotic heart disease of native coronary artery with angina pectoris with documented spasm: Secondary | ICD-10-CM

## 2014-12-21 LAB — CBC WITH DIFFERENTIAL/PLATELET
BASOS PCT: 1 %
Basophils Absolute: 0.1 10*3/uL (ref 0.0–0.1)
Eosinophils Absolute: 0.3 10*3/uL (ref 0.0–0.7)
Eosinophils Relative: 4 %
HEMATOCRIT: 42.7 % (ref 39.0–52.0)
HEMOGLOBIN: 14.2 g/dL (ref 13.0–17.0)
LYMPHS ABS: 1.6 10*3/uL (ref 0.7–4.0)
LYMPHS PCT: 19 %
MCH: 28.4 pg (ref 26.0–34.0)
MCHC: 33.3 g/dL (ref 30.0–36.0)
MCV: 85.4 fL (ref 78.0–100.0)
MONO ABS: 1 10*3/uL (ref 0.1–1.0)
MONOS PCT: 13 %
NEUTROS ABS: 5.1 10*3/uL (ref 1.7–7.7)
NEUTROS PCT: 63 %
Platelets: 51 10*3/uL — ABNORMAL LOW (ref 150–400)
RBC: 5 MIL/uL (ref 4.22–5.81)
RDW: 15.1 % (ref 11.5–15.5)
WBC: 8.1 10*3/uL (ref 4.0–10.5)

## 2014-12-21 LAB — UIFE/LIGHT CHAINS/TP QN, 24-HR UR
% BETA, Urine: 0 %
ALPHA 1 URINE: 0 %
Albumin, U: 100 %
Alpha 2, Urine: 0 %
Free Kappa/Lambda Ratio: 1.98 — ABNORMAL LOW (ref 2.04–10.37)
Free Lambda Lt Chains,Ur: 6.25 mg/L (ref 0.24–6.66)
Free Lt Chn Excr Rate: 12.4 mg/L (ref 1.35–24.19)
GAMMA GLOBULIN URINE: 0 %
Total Protein, Urine: 6.8 mg/dL

## 2014-12-21 LAB — T4, FREE: Free T4: 0.7 ng/dL (ref 0.61–1.12)

## 2014-12-21 LAB — PLATELET ANTIBODY PROFILE, SERUM
HLA AB SER QL EIA: NEGATIVE
IA/IIA Antibody: NEGATIVE
IB/IX Antibody: NEGATIVE
IIB/IIIA ANTIBODY: NEGATIVE

## 2014-12-21 LAB — HEMOGLOBIN A1C
Hgb A1c MFr Bld: 5.6 % (ref 4.8–5.6)
Mean Plasma Glucose: 114 mg/dL

## 2014-12-21 LAB — PROTEIN ELECTROPHORESIS, SERUM
A/G Ratio: 1 (ref 0.7–1.7)
Albumin ELP: 3.4 g/dL (ref 2.9–4.4)
Alpha-1-Globulin: 0.3 g/dL (ref 0.0–0.4)
Alpha-2-Globulin: 0.9 g/dL (ref 0.4–1.0)
Beta Globulin: 1.3 g/dL (ref 0.7–1.3)
Gamma Globulin: 1.1 g/dL (ref 0.4–1.8)
Globulin, Total: 3.5 g/dL (ref 2.2–3.9)
Total Protein ELP: 6.9 g/dL (ref 6.0–8.5)

## 2014-12-21 LAB — HEPATITIS B SURFACE ANTIGEN: Hepatitis B Surface Ag: NEGATIVE

## 2014-12-21 LAB — FOLATE RBC
Folate, Hemolysate: 408.6 ng/mL
Folate, RBC: 937 ng/mL
Hematocrit: 43.6 % (ref 37.5–51.0)

## 2014-12-21 LAB — HAPTOGLOBIN: Haptoglobin: 173 mg/dL (ref 34–200)

## 2014-12-21 NOTE — Consult Note (Signed)
Chief Complaint: Patient was seen in consultation today for thrombocytopenia Chief Complaint  Patient presents with  . Shortness of Breath  . Abdominal Pain   at the request of Dr Marin Olp  Referring Physician(s): Dr Marin Olp  History of Present Illness: [name] Fields is a 80 y.o. male   Hx Lung Ca and Melanoma Pt with increased bruising Work up does reveal thrombocytopenia Possible idiopathic thrombocytopenic purpura Possible Myelodysplastic syndrome Request for bone marrow biopsy   Past Medical History  Diagnosis Date  . IHD (ischemic heart disease)     Remote MI in 2001 with stent to the LCX  . Hypertension   . Hyperlipidemia   . SOB (shortness of breath)     Chronic with known fibrosis  . Hypokinesia     MILD INFERIOR  . GERD (gastroesophageal reflux disease)   . Lactose intolerance   . Diverticulosis   . Hearing loss   . Pleural effusion, left   . Pericardial effusion July 2011    s/p pericardial window; last echo in August 2012 with minimal effusion  . Normal nuclear stress test 2009    EF 63%. No ischemia. 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  . Arthritis     knees  . Anxiety     takes wellbutrin daily  . Hx of radiation therapy 09/29/11;10/01/11;10/03/11;10/06/11;10/08/11;    RLLlung,50Gy/57f - SBRT  . Status post chemotherapy 06/04/2009 -  09/06/2009     4 cycles of Cisplatin and Gemcitabine with Neulasta Support   . Non-small cell carcinoma of lung     Dx in October of 2010    Past Surgical History  Procedure Laterality Date  . Pericardial window  July 2011  . Cardiovascular stress test  05/2007    EF 63%  . Coronary stent placement  07/1999    STENT TO THE LEFT CIRCUMFLEX  . Cardiac catheterization  07/11/2003    EF 60%, REPEAT, WHICH SHOWED 30% PROXIMAL NARROWING IN THE RIGHT  CORONARY ARTERY  . Transthoracic echocardiogram  02/22/2010    EF 55-60%  . Hernia repair      as an adult- R inguinal     Allergies: Sertraline hcl  Medications: Prior to  Admission medications   Medication Sig Start Date End Date Taking? Authorizing Provider  atorvastatin (LIPITOR) 20 MG tablet TAKE 1 TABLET BY MOUTH DAILY 10/05/14  Yes MJerline Pain MD  buPROPion ([facility]) 150 MG 12 hr tablet Take 150 mg by mouth daily.    Yes Historical Provider, MD  clopidogrel (PLAVIX) 75 MG tablet TAKE 1 TABLET BY MOUTH DAILY 12/04/14  Yes MJerline Pain MD  metoprolol tartrate (LOPRESSOR) 25 MG tablet Take 1/2 tablet twice a day 07/31/14  Yes MJerline Pain MD  pantoprazole (PROTONIX) 40 MG tablet TAKE 1 TABLET BY MOUTH TWICE DAILY 01/06/14  Yes Aasim SLona Kettle MD  Tamsulosin HCl (FLOMAX) 0.4 MG CAPS Take 0.4 mg by mouth daily with supper.    Yes Historical Provider, MD  temazepam (RESTORIL) 30 MG capsule Take 30 mg by mouth at bedtime as needed for sleep.   Yes Historical Provider, MD  venlafaxine XR (EFFEXOR-XR) 150 MG 24 hr capsule Take 1 capsule by mouth daily. 12/04/14  Yes Historical Provider, MD     Family History  Problem Relation Age of Onset  . Lung cancer Father   . Heart disease Neg Hx   . Anesthesia problems Neg Hx     Social History   Social History  . Marital  Status: Single    Spouse Name: N/A  . Number of Children: N/A  . Years of Education: N/A   Occupational History  . retired     [employer]   Social History Main Topics  . Smoking status: Former Smoker -- 3.00 packs/day for 40 years    Types: Cigarettes    Quit date: 04/07/1994  . Smokeless tobacco: Never Used     Comment: quit 20 years ago  . Alcohol Use: No  . Drug Use: No  . Sexual Activity: No   Other Topics Concern  . None   Social History Narrative     Review of Systems: A 12 point ROS discussed and pertinent positives are indicated in the HPI above.  All other systems are negative.  Review of Systems  Constitutional: Negative for activity change, appetite change and unexpected weight change.  HENT: Negative for nosebleeds.   Eyes: Negative for visual disturbance.    Respiratory: Negative for shortness of breath.   Cardiovascular: Negative for chest pain.  Gastrointestinal: Negative for blood in stool and anal bleeding.  Skin: Negative for color change.  Neurological: Negative for dizziness and weakness.  Psychiatric/Behavioral: Negative for confusion.    Vital Signs: BP 133/82 mmHg  Pulse 87  Temp(Src) 97.8 F (36.6 C) (Oral)  Resp 20  Ht 5' 10"  (1.778 m)  Wt 190 lb 8 oz (86.41 kg)  BMI 27.33 kg/m2  SpO2 98%  Physical Exam  Constitutional: He is oriented to person, place, and time. He appears well-nourished.  Cardiovascular: Normal rate, regular rhythm and normal heart sounds.   Pulmonary/Chest: Effort normal and breath sounds normal.  Abdominal: Soft. Bowel sounds are normal.  Musculoskeletal: Normal range of motion.  Neurological: He is alert and oriented to person, place, and time. He has normal reflexes.  Skin: Skin is warm and dry.  Psychiatric: He has a normal mood and affect. His behavior is normal. Judgment and thought content normal.  Nursing note and vitals reviewed.   Mallampati Score:  MD Evaluation Airway: WNL Heart: WNL Abdomen: WNL Chest/ Lungs: WNL ASA  Classification: 3 Mallampati/Airway Score: One  Imaging: Ct Chest Wo Contrast  11/21/2014   CLINICAL DATA:  Lung cancer diagnosed in October 2010. Restaging post completion of chemotherapy and radiation therapy in 2011. Shortness of breath. Initial encounter.  EXAM: CT CHEST WITHOUT CONTRAST  TECHNIQUE: Multidetector CT imaging of the chest was performed following the standard protocol without IV contrast.  COMPARISON:  CTs 05/15/2014 and 11/08/2013.  FINDINGS: Mediastinum/Nodes: Mediastinal and bilateral hilar distortion appears unchanged. No discretely enlarged lymph nodes are identified. Hilar assessment is limited by the lack of contrast, although the hilar contours appear unchanged. The thyroid gland, trachea and esophagus demonstrate no significant findings. The  heart size is stable. There is a stable moderate size pericardial effusion. Atherosclerosis and coronary artery stents are noted. Right IJ Port-A-Cath tip is at the SVC right atrial junction.  Lungs/Pleura: Pleural thickening and a small amount of loculated pleural fluid inferiorly on the left are stable. There is no significant right pleural effusion. There is stable volume loss in the left hemithorax with paramediastinal radiation changes, hilar distortion and calcifications. Infrahilar radiation changes extending posteriorly in the right lower lobe are also stable. Scattered small nodules, including a 7 mm subpleural lingular nodule on image 23 are stable.  Upper abdomen: Small gallstones and a small nonobstructing left renal calculus noted. The visualized liver and adrenal glands demonstrate no suspicious findings.  Musculoskeletal/Chest wall: There is no  chest wall mass or suspicious osseous finding.  IMPRESSION: 1. Stable appearance of the chest with radiation changes and hilar distortion bilaterally. 2. Scattered small pulmonary nodules bilaterally are stable. No suspicious or enlarging nodules demonstrated. 3. Stable left pleural thickening and loculated pleural fluid and moderate pericardial effusion.   Electronically Signed   By: Richardean Sale M.D.   On: 11/21/2014 16:29   US Abdomen Complete  12/20/2014   CLINICAL DATA:  Thrombocytopenia, ischemic heart disease, hypertension, GERD, non-small-cell lung carcinoma post chemotherapy and radiation therapy  EXAM: ULTRASOUND ABDOMEN COMPLETE  COMPARISON:  PET-CT 08/05/2011  FINDINGS: Gallbladder: Normally distended without stones or wall thickening. No pericholecystic fluid or sonographic Murphy sign.  Common bile duct: Diameter: 3 mm diameter , normal  Liver: Echogenic, likely fatty infiltration, though this can be seen with cirrhosis and certain infiltrative disorders. No focal hepatic mass nodularity. Hepatopetal portal venous flow.  IVC: Normal  appearance  Pancreas: Inadequately visualized due to bowel gas.  Spleen: Normal appearance, 4.7 cm length  Right Kidney: Length: 10.4 cm. Cortical thinning. Normal cortical echogenicity. No mass or hydronephrosis.  Left Kidney: Length: 10.3 cm. Cortical thinning. Normal cortical echogenicity. No mass or hydronephrosis.  Abdominal aorta: Normal caliber  Other findings: No free fluid  IMPRESSION: Probable fatty infiltration of liver as above.  Inadequate pancreatic visualization.  Age-related renal cortical atrophy without gross renal mass or hydronephrosis.   Electronically Signed   By: Lavonia Dana M.D.   On: 12/20/2014 15:35    Labs:  CBC:  Recent Labs  07/06/14 1331 12/19/14 1651 12/20/14 0338 12/21/14 0336  WBC 6.5 8.3 7.9 8.1  HGB 14.0 15.0 14.6 14.2  HCT 42.9 44.4 43.8 42.7  PLT 225 47* 46* 51*    COAGS:  Recent Labs  12/20/14 0338  INR 1.17  APTT 32    BMP:  Recent Labs  05/15/14 0908 07/06/14 1331 12/19/14 1651 12/20/14 0338  NA  --  146* 138 139  K  --  4.4 4.1 3.8  CL  --  103 103 104  CO2  --  29 26 26   GLUCOSE  --  97 93 120*  BUN 15.6 14 15 12   CALCIUM  --  9.0 9.5 9.2  CREATININE 0.9 1.1 0.93 0.84  GFRNONAA  --   --  >60 >60  GFRAA  --   --  >60 >60    LIVER FUNCTION TESTS:  Recent Labs  07/06/14 1331 12/19/14 1651 12/20/14 0338  BILITOT 0.70 1.2 1.3*  AST 24 19 24   ALT 12 14* 15*  ALKPHOS 98* 123 113  PROT 7.2 7.4 6.4*  ALBUMIN  --  4.1 3.6    TUMOR MARKERS: No results for input(s): AFPTM, CEA, CA199, CHROMGRNA in the last 8760 hours.  Assessment and Plan:  Thrombocytopenia Scheduled for BM bx 9/16 in IR Risks and Benefits discussed with the patient including, but not limited to bleeding, infection, damage to adjacent structures or low yield requiring additional tests. All of the patient's questions were answered, patient is agreeable to proceed. Consent signed and in chart.   Thank you for this interesting consult.  I greatly  enjoyed meeting KEIGEN CADDELL and look forward to participating in their care.  A copy of this report was sent to the requesting provider on this date.  Signed: Disaya Walt A 12/21/2014, 9:00 AM   I spent a total of 20 Minutes    in face to face in clinical consultation, greater than 50% of which  was counseling/coordinating care for thrombocytopenia/BM bx

## 2014-12-21 NOTE — Progress Notes (Signed)
Patient Name: [name] Fields Date of Encounter: 12/21/2014  Principal Problem:   Thrombocytopenia Active Problems:   COPD (chronic obstructive pulmonary disease) with emphysema   Pericardial effusion   CAD (coronary artery disease)   Hyperlipidemia   HTN (hypertension)   GERD (gastroesophageal reflux disease)  SUBJECTIVE  Feeling ok. Stable SOB. No CP or palpitation.   CURRENT MEDS . atorvastatin  20 mg Oral Daily  . buPROPion  150 mg Oral Daily  . Influenza vac split quadrivalent PF  0.5 mL Intramuscular Tomorrow-1000  . metoprolol tartrate  12.5 mg Oral BID  . pantoprazole  40 mg Oral BID  . sodium chloride  3 mL Intravenous Q12H  . tamsulosin  0.4 mg Oral Q supper  . venlafaxine XR  150 mg Oral Daily  . vitamin B-12  500 mcg Oral Daily    OBJECTIVE  Filed Vitals:   12/20/14 2020 12/21/14 0031 12/21/14 0554 12/21/14 0833  BP: 117/81 96/64 108/66 133/82  Pulse: 85 69 72 87  Temp: 98.1 F (36.7 C) 97.7 F (36.5 C) 97.5 F (36.4 C) 97.8 F (36.6 C)  TempSrc: Oral Oral Oral Oral  Resp: 20 20 21 20   Height:      Weight:   190 lb 8 oz (86.41 kg)   SpO2: 95% 98% 97% 98%    Intake/Output Summary (Last 24 hours) at 12/21/14 1041 Last data filed at 12/21/14 0834  Gross per 24 hour  Intake   1640 ml  Output   1200 ml  Net    440 ml   Filed Weights   12/20/14 0052 12/21/14 0554  Weight: 190 lb 4.8 oz (86.32 kg) 190 lb 8 oz (86.41 kg)    PHYSICAL EXAM  General: Pleasant, NAD. Neuro: Alert and oriented X 3. Moves all extremities spontaneously. Psych: Normal affect. HEENT:  Normal  Neck: Supple without bruits or JVD. Lungs:  Resp regular and unlabored. Diminished breath sound bibasilar. Heart: RRR no s3, s4. + systolic murmurs. Abdomen: Soft, non-tender, non-distended, BS + x 4.  Extremities: No clubbing, cyanosis or edema. DP/PT/Radials 2+ and equal bilaterally. Skin: Mild ecchymotic areas with petechia  Accessory Clinical Findings  CBC  Recent  Labs  12/19/14 1651 12/20/14 0338 12/21/14 0336  WBC 8.3 7.9 8.1  NEUTROABS 5.7  --  5.1  HGB 15.0 14.6 14.2  HCT 44.4 43.8 42.7  MCV 84.4 83.9 85.4  PLT 47* 46* 51*   Basic Metabolic Panel  Recent Labs  12/19/14 1651 12/20/14 0338  NA 138 139  K 4.1 3.8  CL 103 104  CO2 26 26  GLUCOSE 93 120*  BUN 15 12  CREATININE 0.93 0.84  CALCIUM 9.5 9.2   Liver Function Tests  Recent Labs  12/19/14 1651 12/20/14 0338  AST 19 24  ALT 14* 15*  ALKPHOS 123 113  BILITOT 1.2 1.3*  PROT 7.4 6.4*  ALBUMIN 4.1 3.6    Recent Labs  12/19/14 1651  LIPASE 36  Thyroid Function Tests  Recent Labs  12/20/14 0112  TSH 8.802*      Radiology/Studies  Ct Chest Wo Contrast  11/21/2014   CLINICAL DATA:  Lung cancer diagnosed in October 2010. Restaging post completion of chemotherapy and radiation therapy in 2011. Shortness of breath. Initial encounter.  EXAM: CT CHEST WITHOUT CONTRAST  TECHNIQUE: Multidetector CT imaging of the chest was performed following the standard protocol without IV contrast.  COMPARISON:  CTs 05/15/2014 and 11/08/2013.  FINDINGS: Mediastinum/Nodes: Mediastinal and bilateral hilar distortion  appears unchanged. No discretely enlarged lymph nodes are identified. Hilar assessment is limited by the lack of contrast, although the hilar contours appear unchanged. The thyroid gland, trachea and esophagus demonstrate no significant findings. The heart size is stable. There is a stable moderate size pericardial effusion. Atherosclerosis and coronary artery stents are noted. Right IJ Port-A-Cath tip is at the SVC right atrial junction.  Lungs/Pleura: Pleural thickening and a small amount of loculated pleural fluid inferiorly on the left are stable. There is no significant right pleural effusion. There is stable volume loss in the left hemithorax with paramediastinal radiation changes, hilar distortion and calcifications. Infrahilar radiation changes extending posteriorly in  the right lower lobe are also stable. Scattered small nodules, including a 7 mm subpleural lingular nodule on image 23 are stable.  Upper abdomen: Small gallstones and a small nonobstructing left renal calculus noted. The visualized liver and adrenal glands demonstrate no suspicious findings.  Musculoskeletal/Chest wall: There is no chest wall mass or suspicious osseous finding.  IMPRESSION: 1. Stable appearance of the chest with radiation changes and hilar distortion bilaterally. 2. Scattered small pulmonary nodules bilaterally are stable. No suspicious or enlarging nodules demonstrated. 3. Stable left pleural thickening and loculated pleural fluid and moderate pericardial effusion.   Electronically Signed   By: Richardean Sale M.D.   On: 11/21/2014 16:29   US Abdomen Complete  12/20/2014   CLINICAL DATA:  Thrombocytopenia, ischemic heart disease, hypertension, GERD, non-small-cell lung carcinoma post chemotherapy and radiation therapy  EXAM: ULTRASOUND ABDOMEN COMPLETE  COMPARISON:  PET-CT 08/05/2011  FINDINGS: Gallbladder: Normally distended without stones or wall thickening. No pericholecystic fluid or sonographic Murphy sign.  Common bile duct: Diameter: 3 mm diameter , normal  Liver: Echogenic, likely fatty infiltration, though this can be seen with cirrhosis and certain infiltrative disorders. No focal hepatic mass nodularity. Hepatopetal portal venous flow.  IVC: Normal appearance  Pancreas: Inadequately visualized due to bowel gas.  Spleen: Normal appearance, 4.7 cm length  Right Kidney: Length: 10.4 cm. Cortical thinning. Normal cortical echogenicity. No mass or hydronephrosis.  Left Kidney: Length: 10.3 cm. Cortical thinning. Normal cortical echogenicity. No mass or hydronephrosis.  Abdominal aorta: Normal caliber  Other findings: No free fluid  IMPRESSION: Probable fatty infiltration of liver as above.  Inadequate pancreatic visualization.  Age-related renal cortical atrophy without gross renal mass  or hydronephrosis.   Electronically Signed   By: Lavonia Dana M.D.   On: 12/20/2014 15:35    ASSESSMENT AND PLAN   1. Chronic ericardial effusion -CTs over the past 2 years have demonstrated a stable small pericardial effusion which was recently read as small to moderate on the 11/21/2014 scan. - Pericardial window on 10/2009, negative for malignancy. - Echo has done yesterday however pending final reading. Positional stable SOB could be from COPD. ? Pericardial inflammation in setting of mildly elevated CPR of 2.7, sed rate of 20 and fibrinogen of 535.  - He does not seems in tamponade. Will discuss with for further plan.   2. Thrombocytopenia - Oncology following, plan for bone marrow biopsy  3. Non Small cell carcinoma - CT of the chest shows stable lymph nodes, without new masses - per primary  4. CAD without angina -April of 2001, lateral wall MI, circumflex stent with brachii therapy and angioplasty of stent in 2001 -plavix on hold due to thrombocytopenia - No chest pain. His SOB is likely not due to angina.  5. High TSH  - normal free T4, per primary  Signed,  Zadie Deemer PA-C

## 2014-12-21 NOTE — Progress Notes (Signed)
PROGRESS NOTE  [name] Fields:[id number] DOB: 1934-12-21 DOA: 12/19/2014 PCP: [name], MD  Brief history 80 year old male with history of hypertension, hyperlipidemia, pericardial window July 2011, CAD with history of stent in the circumflex, non-small cell lung cancer presented with abdominal wall bruising for the past 2 months. The [name] Fields went to see his primary care provider on 12/19/2014. Blood work revealed that he was [name] Fields had repeated. As result, the [name] Fields was injected into the emergency department. [name] Fields denies any recent injuries or falls. He denies any new medications. He denies any NSAIDs. He does not take any over-the-counter medications. [name] Fields had CT chest on 11/21/2014 which revealed moderate pericardial effusion. The [name] Fields's biggest complaint is that of chest discomfort and shortness of breath/dyspnea on exertion which he has minimized his family for the past 2 months. He states that his chest discomfort is worse with supine position. Assessment/Plan: Thrombocytopenia -Suspect this may be multifactorial including possible ITP, low serum B12, but cannot rule out MDS -Serum B12--257-->start supplement (hesitate to use IM due to low platelets) -RBC folate--937 -INR 1.17, PTT 32 -Fibrinogen 535 -Abdominal ultrasound--neg for hepatosplenomegaly -peripheral smear--no schistocytes, hypersegmented neutrophils -LDH 137 -haptoglobin--173 -HIV--neg -SPEP pending - Dr. Junius Finner marrow biopsy 9/16 -start colchicine and restart plavix  if okay with Dr. Marin Olp Pericardial effusion -Repeat echocardiogram--trivial pericardial effusion, EF 60-65%, grade 1 DD -appreciate cardiology-->start colchicine -hx of previous pericardial window 10/25/2009--no malignant cells CAD without angina -April of 2001, lateral wall MI, circumflex stent with brachii therapy and angioplasty of stent in 2001 -plavix on hold due to thrombocytopenia Left loculated  pleural effusion -Amount of fluid is small -[name] Fields is not hypoxemic -Hold off thoracocentesis Elevated TSH-->Euthyroid sick syndrome -Check free T4--0.70 NSCLC -last XRT on 10/08/11 -last chemo ~54yrago -Ennever consulted--input appreciated Hypertension -Continue metoprolol tartrate Hyperlipidemia -Continue statin Depression -Continue Effexor and Wellbutrin   Disposition Plan: Home 12/22/14 after bone marrow bx if stable and no active bleed  Family Communication--sister updated at bedside; total time spent 35 min, >50% spent counseling and coordinating care     Procedures/Studies: UKoreaAbdomen Complete  12/20/2014   CLINICAL DATA:  Thrombocytopenia, ischemic heart disease, hypertension, GERD, non-small-cell lung carcinoma post chemotherapy and radiation therapy  EXAM: ULTRASOUND ABDOMEN COMPLETE  COMPARISON:  PET-CT 08/05/2011  FINDINGS: Gallbladder: Normally distended without stones or wall thickening. No pericholecystic fluid or sonographic Murphy sign.  Common bile duct: Diameter: 3 mm diameter , normal  Liver: Echogenic, likely fatty infiltration, though this can be seen with cirrhosis and certain infiltrative disorders. No focal hepatic mass nodularity. Hepatopetal portal venous flow.  IVC: Normal appearance  Pancreas: Inadequately visualized due to bowel gas.  Spleen: Normal appearance, 4.7 cm length  Right Kidney: Length: 10.4 cm. Cortical thinning. Normal cortical echogenicity. No mass or hydronephrosis.  Left Kidney: Length: 10.3 cm. Cortical thinning. Normal cortical echogenicity. No mass or hydronephrosis.  Abdominal aorta: Normal caliber  Other findings: No free fluid  IMPRESSION: Probable fatty infiltration of liver as above.  Inadequate pancreatic visualization.  Age-related renal cortical atrophy without gross renal mass or hydronephrosis.   Electronically Signed   By: MLavonia DanaM.D.   On: 12/20/2014 15:35         Subjective: [name] Fields still has some dyspnea on  exertion. Denies any fevers, chills, nausea, vomiting, diarrhea, dysuria, hematuria. No rashes.  Objective: Filed Vitals:   12/21/14 0554 12/21/14 0833 12/21/14 1212 12/21/14 1300  BP: 108/66 133/82 115/67 111/69  Pulse: 72 87 74 62  Temp: 97.5 F (36.4 C) 97.8 F (36.6 C) 97.9 F (36.6 C) 94.9 F (34.9 C)  TempSrc: Oral Oral  Oral  Resp: 21 20 20 20   Height:      Weight: 86.41 kg (190 lb 8 oz)     SpO2: 97% 98% 100% 99%    Intake/Output Summary (Last 24 hours) at 12/21/14 1515 Last data filed at 12/21/14 0834  Gross per 24 hour  Intake   1640 ml  Output   1000 ml  Net    640 ml   Weight change: 0.091 kg (3.2 oz) Exam:   General:  Pt is alert, follows commands appropriately, not in acute distress  HEENT: No icterus, No thrush, No neck mass, [place]/AT  Cardiovascular: RRR, S1/S2, no rubs, no gallops  Respiratory: Bibasilar crackles, left greater than right. No wheezing. Good air movement.  Abdomen: Soft/+BS, non tender, non distended, no guarding  Extremities: No edema, No lymphangitis, No petechiae, No rashes, no synovitis  Data Reviewed: Basic Metabolic Panel:  Recent Labs Lab 12/19/14 1651 12/20/14 0338  NA 138 139  K 4.1 3.8  CL 103 104  CO2 26 26  GLUCOSE 93 120*  BUN 15 12  CREATININE 0.93 0.84  CALCIUM 9.5 9.2   Liver Function Tests:  Recent Labs Lab 12/19/14 1651 12/20/14 0338  AST 19 24  ALT 14* 15*  ALKPHOS 123 113  BILITOT 1.2 1.3*  PROT 7.4 6.4*  ALBUMIN 4.1 3.6    Recent Labs Lab 12/19/14 1651  LIPASE 36   No results for input(s): AMMONIA in the last 168 hours. CBC:  Recent Labs Lab 12/19/14 1651 12/20/14 0112 12/20/14 0338 12/21/14 0336  WBC 8.3  --  7.9 8.1  NEUTROABS 5.7  --   --  5.1  HGB 15.0  --  14.6 14.2  HCT 44.4 43.6 43.8 42.7  MCV 84.4  --  83.9 85.4  PLT 47*  --  46* 51*   Cardiac Enzymes: No results for input(s): CKTOTAL, CKMB, CKMBINDEX, TROPONINI in the last 168 hours. BNP: Invalid input(s):  POCBNP CBG: No results for input(s): GLUCAP in the last 168 hours.  No results found for this or any previous visit (from the past 240 hour(s)).   Scheduled Meds: . atorvastatin  20 mg Oral Daily  . buPROPion  150 mg Oral Daily  . Influenza vac split quadrivalent PF  0.5 mL Intramuscular Tomorrow-1000  . metoprolol tartrate  12.5 mg Oral BID  . pantoprazole  40 mg Oral BID  . sodium chloride  3 mL Intravenous Q12H  . tamsulosin  0.4 mg Oral Q supper  . venlafaxine XR  150 mg Oral Daily  . vitamin B-12  500 mcg Oral Daily   Continuous Infusions:    Aubrielle Stroud, DO  [facility] Pager [phone]  If 7PM-7AM, please contact night-coverage [email or web address] Password [email or web address] 12/21/2014, 3:15 PM   LOS: 2 days

## 2014-12-21 NOTE — Progress Notes (Signed)
Utilization review completed. Lonie Newsham, RN, BSN. 

## 2014-12-22 ENCOUNTER — Inpatient Hospital Stay (HOSPITAL_COMMUNITY): Payer: Medicare Other

## 2014-12-22 LAB — BONE MARROW EXAM: BONE MARROW EXAM: 696

## 2014-12-22 LAB — CBC
HCT: 43.5 % (ref 39.0–52.0)
Hemoglobin: 14.6 g/dL (ref 13.0–17.0)
MCH: 28.5 pg (ref 26.0–34.0)
MCHC: 33.6 g/dL (ref 30.0–36.0)
MCV: 85 fL (ref 78.0–100.0)
PLATELETS: 46 10*3/uL — AB (ref 150–400)
RBC: 5.12 MIL/uL (ref 4.22–5.81)
RDW: 14.9 % (ref 11.5–15.5)
WBC: 7.6 10*3/uL (ref 4.0–10.5)

## 2014-12-22 MED ORDER — ASPIRIN 81 MG PO CHEW: 81.0000 mg | CHEWABLE_TABLET | Freq: Every day | ORAL | Status: DC

## 2014-12-22 MED ORDER — MIDAZOLAM HCL 2 MG/2ML IJ SOLN
INTRAMUSCULAR | Status: AC | PRN
  Administered 2014-12-22: 1 mg via INTRAVENOUS

## 2014-12-22 MED ORDER — ASPIRIN 81 MG PO CHEW
81.0000 mg | CHEWABLE_TABLET | Freq: Every day | ORAL | Status: DC
  Administered 2014-12-22: 81 mg via ORAL
  Filled 2014-12-22: qty 1

## 2014-12-22 MED ORDER — MIDAZOLAM HCL 2 MG/2ML IJ SOLN
INTRAMUSCULAR | Status: AC
  Filled 2014-12-22: qty 2

## 2014-12-22 MED ORDER — CYANOCOBALAMIN 500 MCG PO TABS: 500.0000 ug | ORAL_TABLET | Freq: Every day | ORAL | Status: DC

## 2014-12-22 MED ORDER — COLCHICINE 0.6 MG PO TABS: 0.6000 mg | ORAL_TABLET | Freq: Two times a day (BID) | ORAL | Status: DC

## 2014-12-22 MED ORDER — COLCHICINE 0.6 MG PO TABS
0.6000 mg | ORAL_TABLET | Freq: Two times a day (BID) | ORAL | Status: DC
  Administered 2014-12-22: 0.6 mg via ORAL
  Filled 2014-12-22: qty 1

## 2014-12-22 MED ORDER — FENTANYL CITRATE (PF) 100 MCG/2ML IJ SOLN
INTRAMUSCULAR | Status: AC
  Filled 2014-12-22: qty 2

## 2014-12-22 MED ORDER — FENTANYL CITRATE (PF) 100 MCG/2ML IJ SOLN
INTRAMUSCULAR | Status: AC | PRN
  Administered 2014-12-22: 50 ug via INTRAVENOUS

## 2014-12-22 NOTE — Care Management Important Message (Signed)
Important Message  Patient Details  Name: [name] Fields MRN: [id number] Date of Birth: Jan 30, 1935   Medicare Important Message Given:  Yes-second notification given    Delorse Lek 12/22/2014, 1:39 PM

## 2014-12-22 NOTE — Progress Notes (Signed)
Pt a/o, no c/o pain, pt had bone marrow bx, pt being dc to home, instructions given, pt verbalized understanding, VSS, pt stable

## 2014-12-22 NOTE — Progress Notes (Signed)
[name] Fields   DOB:09-Apr-1934   ZO#:[id number]   JWJ#:[id number]  Subjective: Events since 9/14 noted. Doing well post bone marrow biopsy today. has some mild dyspnea.Denies fevers, chills, night sweats, vision changes, or mucositis.  Denies any chest pain or palpitations. Denies lower extremity swelling. Denies nausea, heartburn or change in bowel habits. Appetite is normal. Denies any dysuria. Denies abnormal skin rashes, or neuropathy. Denies any bleeding issues such as epistaxis, hematemesis, hematuria or hematochezia. Ambulating without difficulty.  Scheduled Meds: . aspirin  81 mg Oral Daily  . atorvastatin  20 mg Oral Daily  . buPROPion  150 mg Oral Daily  . colchicine  0.6 mg Oral BID  . fentaNYL      . Influenza vac split quadrivalent PF  0.5 mL Intramuscular Tomorrow-1000  . metoprolol tartrate  12.5 mg Oral BID  . midazolam      . pantoprazole  40 mg Oral BID  . sodium chloride  3 mL Intravenous Q12H  . tamsulosin  0.4 mg Oral Q supper  . venlafaxine XR  150 mg Oral Daily  . vitamin B-12  500 mcg Oral Daily   Continuous Infusions:  PRN Meds:.acetaminophen **OR** acetaminophen, albuterol, temazepam  Objective:  Filed Vitals:   12/22/14 1058  BP: 109/65  Pulse: 81  Temp:   Resp:     Body mass index is 27.52 kg/(m^2).  Intake/Output Summary (Last 24 hours) at 12/22/14 1333 Last data filed at 12/22/14 1314  Gross per 24 hour  Intake    120 ml  Output      0 ml  Net    120 ml    GENERAL:alert, no distress and comfortable. He slightly dyspneic when he is lying flat. SKIN: skin color, texture, turgor are normal, no rashes or significant lesions EYES: normal, conjunctiva are pink and non-injected, sclera clear OROPHARYNX:no exudate, no erythema and lips, buccal mucosa, and tongue normal  NECK: supple, thyroid normal size, non-tender, without nodularity LYMPH: no palpable lymphadenopathy in the cervical, axillary or inguinal LUNGS:bibasilar rales with normal  breathing effort HEART: regular rate & rhythm and no murmurs and no lower extremity edema ABDOMEN: soft, non-tender and normal bowel sounds Musculoskeletal:no cyanosis of digits and no clubbing  PSYCH: alert & oriented x 3 with fluent speech NEURO: no focal motor/sensory deficits      Labs:   Recent Labs Lab 12/19/14 1651 12/20/14 0112 12/20/14 0338 12/21/14 0336 12/22/14 0434  WBC 8.3  --  7.9 8.1 7.6  HGB 15.0  --  14.6 14.2 14.6  HCT 44.4 43.6 43.8 42.7 43.5  PLT 47*  --  46* 51* 46*  MCV 84.4  --  83.9 85.4 85.0  MCH 28.5  --  28.0 28.4 28.5  MCHC 33.8  --  33.3 33.3 33.6  RDW 14.9  --  15.0 15.1 14.9  LYMPHSABS 1.3  --   --  1.6  --   MONOABS 1.1*  --   --  1.0  --   EOSABS 0.2  --   --  0.3  --   BASOSABS 0.1  --   --  0.1  --      Chemistries:    Recent Labs Lab 12/19/14 1651 12/20/14 0338  NA 138 139  K 4.1 3.8  CL 103 104  CO2 26 26  GLUCOSE 93 120*  BUN 15 12  CREATININE 0.93 0.84  CALCIUM 9.5 9.2  AST 19 24  ALT 14* 15*  ALKPHOS 123 113  BILITOT 1.2  1.3*     Liver Function Tests:  Recent Labs Lab 12/19/14 1651 12/20/14 0338  AST 19 24  ALT 14* 15*  ALKPHOS 123 113  BILITOT 1.2 1.3*  PROT 7.4 6.4*  ALBUMIN 4.1 3.6    Recent Labs Lab 12/19/14 1651  LIPASE 36   Coagulation profile  Recent Labs Lab 12/20/14 0338  INR 1.17     Anemia work up  Recent Labs  12/20/14 0112  VITAMINB12 257  FERRITIN 92  TIBC 405  IRON 95  RETICCTPCT 1.8    Microbiology No results found for this or any previous visit (from the past 240 hour(s)).   Studies:  US Abdomen Complete  12/20/2014   CLINICAL DATA:  Thrombocytopenia, ischemic heart disease, hypertension, GERD, non-small-cell lung carcinoma post chemotherapy and radiation therapy  EXAM: ULTRASOUND ABDOMEN COMPLETE  COMPARISON:  PET-CT 08/05/2011  FINDINGS: Gallbladder: Normally distended without stones or wall thickening. No pericholecystic fluid or sonographic Murphy sign.   Common bile duct: Diameter: 3 mm diameter , normal  Liver: Echogenic, likely fatty infiltration, though this can be seen with cirrhosis and certain infiltrative disorders. No focal hepatic mass nodularity. Hepatopetal portal venous flow.  IVC: Normal appearance  Pancreas: Inadequately visualized due to bowel gas.  Spleen: Normal appearance, 4.7 cm length  Right Kidney: Length: 10.4 cm. Cortical thinning. Normal cortical echogenicity. No mass or hydronephrosis.  Left Kidney: Length: 10.3 cm. Cortical thinning. Normal cortical echogenicity. No mass or hydronephrosis.  Abdominal aorta: Normal caliber  Other findings: No free fluid  IMPRESSION: Probable fatty infiltration of liver as above.  Inadequate pancreatic visualization.  Age-related renal cortical atrophy without gross renal mass or hydronephrosis.   Electronically Signed   By: Lavonia Dana M.D.   On: 12/20/2014 15:35   Ct Biopsy  12/22/2014   CLINICAL DATA:  Thrombocytopenia, possible ITP versus myelodysplastic syndrome.  EXAM: CT GUIDED RIGHT ILIAC BONE MARROW ASPIRATION AND CORE BIOPSY  Date:  9/16/20169/16/2016 11:23 am  Radiologist:  M. Daryll Brod, MD  Guidance:  CT  FLUOROSCOPY TIME:  NONE.  MEDICATIONS AND MEDICAL HISTORY: 1 mg Versed, 50 mcg fentanyl  ANESTHESIA/SEDATION: 17 min  CONTRAST:  None  COMPLICATIONS: None  PROCEDURE: Informed consent was obtained from the patient following explanation of the procedure, risks, benefits and alternatives. The patient understands, agrees and consents for the procedure. All questions were addressed. A time out was performed.  The patient was positioned prone and noncontrast localization CT was performed of the pelvis to demonstrate the iliac marrow spaces.  Maximal barrier sterile technique utilized including caps, mask, sterile gowns, sterile gloves, large sterile drape, hand hygiene, and betadine prep.  Under sterile conditions and local anesthesia, an 11 gauge coaxial bone biopsy needle was advanced into the  right iliac marrow space. Needle position was confirmed with CT imaging. Initially, bone marrow aspiration was performed. Next, the 11 gauge outer cannula was utilized to obtain a right iliac bone marrow core biopsy. Needle was removed. Hemostasis was obtained with compression. The patient tolerated the procedure well. Samples were prepared with the cytotechnologist. No immediate complications.  IMPRESSION: CT guided right iliac bone marrow aspiration and core biopsy.   Electronically Signed   By: Jerilynn Mages.  Shick M.D.   On: 12/22/2014 12:09     Assessment / Plan:     Thrombocytopenia MMultifactorial, cannot rule out MDS S/p bone marrow biopsy on 9/48 without complications No bleeding issues are noted Serum B12--257-->start supplement (hesitate to use IM due to low platelets) -RBC folate--937 -  INR 1.17, PTT 32 -Fibrinogen 535 -Abdominal ultrasound--negative for hepatosplenomegaly -peripheral smear--no schistocytes, hypersegmented neutrophils -LDH 137 -haptoglobin--173 -HIV--negative -SPEP pending Monitor counts closely Transfuse 1 unit of platelets if count is less or equal than 10,000 or 20,000 if the patient is acutely bleeding Current platelet count 46,000 Hold Lovenox if platelets drop to less than 50,000 Will follow  Pericardial effusion 2 D echo showed trivial pericardial effusion, EF 60-65%, grade 1 DD Cardiology involved Of note he has a history of prior pericardial window on 10/2009, negative for malignancy He was started on colchicine and restarted on Paavix  Non Small cell carcinoma Small left pleural effusion CT of the chest shows stable lymph nodes, without new masses On observation  Full Code Other medical issues as per admitting team     [name] Fields 12/22/2014  1:33 PM [facility]  ADDENDUM:  I agree with the above.  He had his bone marrow biopsy done.  I don't see any problems with him being on aspirin.  I probably would not put him on Plavix.  I think if he needs to be on colchicine, that should be okay.  We will have to await the results from his bone marrow biopsy.  I will then plan to get back into the office and see what we need to do to help him out if anything.  Again, if this is immune mediated thrombo-cytopenia, we might have to do anything. However, if he needs to be on antiplatelet agents, we may have to get his platelet count up a little bit more.  Pete E.

## 2014-12-22 NOTE — Progress Notes (Signed)
Utilization review completed. Rozanna Boer, RN, BSN./updated review completed.

## 2014-12-22 NOTE — Discharge Summary (Signed)
Physician Discharge Summary  [name] Fields TFT:[id number] DOB: 04-29-1934 DOA: 12/19/2014  PCP: [name], MD  Admit date: 12/19/2014 Discharge date: 12/22/2014  Recommendations for Outpatient Follow-up:  1. Pt will need to follow up with PCP in 2 weeks post discharge 2. Please obtain CBC and BMP in one week Discharge Diagnoses:  Thrombocytopenia -Suspect this may be multifactorial including possible ITP, low serum B12, but cannot rule out MDS -Serum B12--257-->start supplement (hesitate to use IM due to low platelets) -RBC folate--937 -INR 1.17, PTT 32 -Fibrinogen 535 -Abdominal ultrasound--neg for hepatosplenomegaly -peripheral smear--no schistocytes, hypersegmented neutrophils -LDH 137 -haptoglobin--173 -HIV--neg -SPEP--no M-spike - Dr. Junius Finner marrow biopsy 9/16--pt will f/u in office for results -Dr. Marin Olp to arrange follow up -case discussed with Dr. Princess Bruins to start colchicine, but not plavix -I discussed with pt's cardiologist, Dr. Harlon Flor agreed it was ok to leave off plavix -start ASA 81 mg daily as Dr. Marin Olp is okay with this  Pericardial effusion/Pericarditis -12/20/14 echocardiogram--trivial pericardial effusion, EF 60-65%, grade 1 DD -appreciate cardiology-->start colchicine due to concerns of pericarditis -hx of previous pericardial window 10/25/2009--no malignant cells CAD without angina -April of 2001, lateral wall MI, circumflex stent with brachii therapy and angioplasty of stent in 2001 -plavix on hold due to thrombocytopenia Left loculated pleural effusion -Amount of fluid is small -Patient is not hypoxemic -Hold off thoracocentesis Elevated TSH-->Euthyroid sick syndrome -Check free T4--0.70 NSCLC -last XRT on 10/08/11 -last chemo ~84yrago -Ennever consulted--input appreciated Hypertension -Continue metoprolol tartrate Hyperlipidemia -Continue statin Depression -Continue Effexor and Wellbutrin  Discharge  Condition: stable  Disposition: home  Diet:heart healthy Wt Readings from Last 3 Encounters:  12/22/14 87 kg (191 lb 12.8 oz)  11/02/14 90.629 kg (199 lb 12.8 oz)  09/06/14 89.721 kg (197 lb 12.8 oz)    History of present illness:  80year old male with history of hypertension, hyperlipidemia, pericardial window July 2011, CAD with history of stent in the circumflex, non-small cell lung cancer presented with abdominal wall bruising for the past 2 months. The patient went to see his primary care provider on 12/19/2014. Blood work revealed that he was TMarcello Fields repeated. As result, the patient was injected into the emergency department. Patient denies any recent injuries or falls. He denies any new medications. He denies any NSAIDs. He does not take any over-the-counter medications. Patient had CT chest on 11/21/2014 which revealed moderate pericardial effusion. The patient's biggest complaint is that of chest discomfort and shortness of breath/dyspnea on exertion which he has minimized his family for the past 2 months. He states that his chest discomfort is worse with supine position.  Throughout the hospitalization, the patient's lites remained stable in the 45-51,000 range. There was no signs of active bleeding. His hemoglobin remained stable. The patient had an elevated CRP at 2.7. He was seen by cardiology. Cardiology felt the patient may have pericarditis. They recommended starting the patient on colchicine. He was seen by hematology. Bone marrow biopsy was performed. Results are pending at the time of discharge. After discussion with hematology, Dr. EMarin Olp was okay with starting the patient on aspirin 81 mg daily. I contacted the patient's outpatient cardiologist, Dr. SMarlou Porch to update him on the clinical situation. He was agreeable to leaving the patient off Plavix for now.  Consultants: Cardiology and hematology  Discharge Exam: Filed Vitals:   12/22/14 1058  BP: 109/65  Pulse: 81    Temp:   Resp:    Filed Vitals:   12/22/14 1026 12/22/14 1032 12/22/14 1039 12/22/14  1058  BP: 128/76 135/82 127/77 109/65  Pulse: 80 83 78 81  Temp:      TempSrc:      Resp: _0 Height:      Weight:      SpO2: 97% 96% 94%    General: A&O x 3, NAD, pleasant, cooperative Cardiovascular: RRR, no rub, no gallop, no S3 Respiratory: Bibasilar crackles. No wheezing. Good air movement Abdomen:soft, nontender, nondistended, positive bowel sounds Extremities: No edema, No lymphangitis, no petechiae  Discharge Instructions      Discharge Instructions    Diet - low sodium heart healthy    Complete by:  As directed      Increase activity slowly    Complete by:  As directed             Medication List    STOP taking these medications        clopidogrel 75 MG tablet  Commonly known as:  PLAVIX      TAKE these medications        aspirin 81 MG chewable tablet  Chew 1 tablet (81 mg total) by mouth daily.     atorvastatin 20 MG tablet  Commonly known as:  LIPITOR  TAKE 1 TABLET BY MOUTH DAILY     buPROPion 150 MG 12 hr tablet  Commonly known as:  WELLBUTRIN SR  Take 150 mg by mouth daily.     colchicine 0.6 MG tablet  Take 1 tablet (0.6 mg total) by mouth 2 (two) times daily.     cyanocobalamin 500 MCG tablet  Take 1 tablet (500 mcg total) by mouth daily.     metoprolol tartrate 25 MG tablet  Commonly known as:  LOPRESSOR  Take 1/2 tablet twice a day     pantoprazole 40 MG tablet  Commonly known as:  PROTONIX  TAKE 1 TABLET BY MOUTH TWICE DAILY     tamsulosin 0.4 MG Caps capsule  Commonly known as:  FLOMAX  Take 0.4 mg by mouth daily with supper.     temazepam 30 MG capsule  Commonly known as:  RESTORIL  Take 30 mg by mouth at bedtime as needed for sleep.     venlafaxine XR 150 MG 24 hr capsule  Commonly known as:  EFFEXOR-XR  Take 1 capsule by mouth daily.         The results of significant diagnostics from this hospitalization (including  imaging, microbiology, ancillary and laboratory) are listed below for reference.    Significant Diagnostic Studies: US Abdomen Complete  12/20/2014   CLINICAL DATA:  Thrombocytopenia, ischemic heart disease, hypertension, GERD, non-small-cell lung carcinoma post chemotherapy and radiation therapy  EXAM: ULTRASOUND ABDOMEN COMPLETE  COMPARISON:  PET-CT 08/05/2011  FINDINGS: Gallbladder: Normally distended without stones or wall thickening. No pericholecystic fluid or sonographic Murphy sign.  Common bile duct: Diameter: 3 mm diameter , normal  Liver: Echogenic, likely fatty infiltration, though this can be seen with cirrhosis and certain infiltrative disorders. No focal hepatic mass nodularity. Hepatopetal portal venous flow.  IVC: Normal appearance  Pancreas: Inadequately visualized due to bowel gas.  Spleen: Normal appearance, 4.7 cm length  Right Kidney: Length: 10.4 cm. Cortical thinning. Normal cortical echogenicity. No mass or hydronephrosis.  Left Kidney: Length: 10.3 cm. Cortical thinning. Normal cortical echogenicity. No mass or hydronephrosis.  Abdominal aorta: Normal caliber  Other findings: No free fluid  IMPRESSION: Probable fatty infiltration of liver as above.  Inadequate pancreatic visualization.  Age-related renal cortical atrophy without  gross renal mass or hydronephrosis.   Electronically Signed   By: Lavonia Dana M.D.   On: 12/20/2014 15:35   Ct Biopsy  12/22/2014   CLINICAL DATA:  Thrombocytopenia, possible ITP versus myelodysplastic syndrome.  EXAM: CT GUIDED RIGHT ILIAC BONE MARROW ASPIRATION AND CORE BIOPSY  Date:  9/16/20169/16/2016 11:23 am  Radiologist:  M. Daryll Brod, MD  Guidance:  CT  FLUOROSCOPY TIME:  NONE.  MEDICATIONS AND MEDICAL HISTORY: 1 mg Versed, 50 mcg fentanyl  ANESTHESIA/SEDATION: 17 min  CONTRAST:  None  COMPLICATIONS: None  PROCEDURE: Informed consent was obtained from the patient following explanation of the procedure, risks, benefits and alternatives. The patient  understands, agrees and consents for the procedure. All questions were addressed. A time out was performed.  The patient was positioned prone and noncontrast localization CT was performed of the pelvis to demonstrate the iliac marrow spaces.  Maximal barrier sterile technique utilized including caps, mask, sterile gowns, sterile gloves, large sterile drape, hand hygiene, and betadine prep.  Under sterile conditions and local anesthesia, an 11 gauge coaxial bone biopsy needle was advanced into the right iliac marrow space. Needle position was confirmed with CT imaging. Initially, bone marrow aspiration was performed. Next, the 11 gauge outer cannula was utilized to obtain a right iliac bone marrow core biopsy. Needle was removed. Hemostasis was obtained with compression. The patient tolerated the procedure well. Samples were prepared with the cytotechnologist. No immediate complications.  IMPRESSION: CT guided right iliac bone marrow aspiration and core biopsy.   Electronically Signed   By: Jerilynn Mages.  Shick M.D.   On: 12/22/2014 12:09     Microbiology: No results found for this or any previous visit (from the past 240 hour(s)).   Labs: Basic Metabolic Panel:  Recent Labs Lab 12/19/14 1651 12/20/14 0338  NA 138 139  K 4.1 3.8  CL 103 104  CO2 26 26  GLUCOSE 93 120*  BUN 15 12  CREATININE 0.93 0.84  CALCIUM 9.5 9.2   Liver Function Tests:  Recent Labs Lab 12/19/14 1651 12/20/14 0338  AST 19 24  ALT 14* 15*  ALKPHOS 123 113  BILITOT 1.2 1.3*  PROT 7.4 6.4*  ALBUMIN 4.1 3.6    Recent Labs Lab 12/19/14 1651  LIPASE 36   No results for input(s): AMMONIA in the last 168 hours. CBC:  Recent Labs Lab 12/19/14 1651 12/20/14 0112 12/20/14 0338 12/21/14 0336 12/22/14 0434  WBC 8.3  --  7.9 8.1 7.6  NEUTROABS 5.7  --   --  5.1  --   HGB 15.0  --  14.6 14.2 14.6  HCT 44.4 43.6 43.8 42.7 43.5  MCV 84.4  --  83.9 85.4 85.0  PLT 47*  --  46* 51* 46*   Cardiac Enzymes: No results  for input(s): CKTOTAL, CKMB, CKMBINDEX, TROPONINI in the last 168 hours. BNP: Invalid input(s): POCBNP CBG: No results for input(s): GLUCAP in the last 168 hours.  Time coordinating discharge:  Greater than 30 minutes  Signed:  TAT, DAVID, DO [facility] Pager: [phone] 12/22/2014, 1:46 PM

## 2014-12-22 NOTE — Procedures (Signed)
successgul RT ILIAC BM ASP AND CORE BX No comp Stable Path pending Full report in PACS

## 2014-12-25 ENCOUNTER — Ambulatory Visit (INDEPENDENT_AMBULATORY_CARE_PROVIDER_SITE_OTHER): Payer: Medicare Other | Admitting: Cardiology

## 2014-12-25 ENCOUNTER — Encounter: Payer: Self-pay | Admitting: Cardiology

## 2014-12-25 VITALS — BP 126/80 | HR 74 | Ht 70.0 in | Wt 195.4 lb

## 2014-12-25 DIAGNOSIS — D696 Thrombocytopenia, unspecified: Secondary | ICD-10-CM

## 2014-12-25 DIAGNOSIS — R0789 Other chest pain: Secondary | ICD-10-CM | POA: Diagnosis not present

## 2014-12-25 DIAGNOSIS — I2510 Atherosclerotic heart disease of native coronary artery without angina pectoris: Secondary | ICD-10-CM

## 2014-12-25 DIAGNOSIS — I2583 Coronary atherosclerosis due to lipid rich plaque: Secondary | ICD-10-CM

## 2014-12-25 NOTE — Progress Notes (Signed)
[facility]. [address], [place]  [address] Phone: [phone] Fax:  [phone]  Date:  12/25/2014   ID:  JEB SCHLOEMER, DOB 08-25-1934, MRN [id number]  PCP:  [name], MD   History of Present Illness: [name] Fields is a 80 y.o. male with coronary artery disease status post multiple stents in the past, former patient of Dr. Doreatha Lew here for followup. Recurrent pleural effusion.  Done with radiation tx for lung cancer. CT scan 01/03/13. 6 month CT interval.  In April of 2001, lateral wall MI, circumflex stent with brachii therapy and angioplasty of stent in 2001. In 2011 underwent a pericardial window because of pericardial effusion. He walks 6 days a week 30 min a day. COPD, Dr. Lamonte Sakai. Has to stop during walk. He shortness of breath has been quite chronic.  He was recently hospitalized 12/22/14-had some centralized chest discomfort, colchicine was started him. He for possibility of pericarditis. He had a trivial pericardial effusion.  LDL 63 on 03/23/12, creatinine 0.92.  Vertigo, fell, hit head. 16 weeks of therapy.   Overall he feels somewhat tired during exercise. Continue to walk.   Wt Readings from Last 3 Encounters:  12/25/14 195 lb 6.4 oz (88.633 kg)  12/22/14 191 lb 12.8 oz (87 kg)  11/02/14 199 lb 12.8 oz (90.629 kg)     Past Medical History  Diagnosis Date  . IHD (ischemic heart disease)     Remote MI in 2001 with stent to the LCX  . Hypertension   . Hyperlipidemia   . SOB (shortness of breath)     Chronic with known fibrosis  . Hypokinesia     MILD INFERIOR  . GERD (gastroesophageal reflux disease)   . Lactose intolerance   . Diverticulosis   . Hearing loss   . Pleural effusion, left   . Pericardial effusion July 2011    s/p pericardial window; last echo in August 2012 with minimal effusion  . Normal nuclear stress test 2009    EF 63%. No ischemia. 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  . Arthritis     knees  . Anxiety     takes  wellbutrin daily  . Hx of radiation therapy 09/29/11;10/01/11;10/03/11;10/06/11;10/08/11;    RLLlung,50Gy/74f - SBRT  . Status post chemotherapy 06/04/2009 -  09/06/2009     4 cycles of Cisplatin and Gemcitabine with Neulasta Support   . Non-small cell carcinoma of lung     Dx in October of 2010    Past Surgical History  Procedure Laterality Date  . Pericardial window  July 2011  . Cardiovascular stress test  05/2007    EF 63%  . Coronary stent placement  07/1999    STENT TO THE LEFT CIRCUMFLEX  . Cardiac catheterization  07/11/2003    EF 60%, REPEAT, WHICH SHOWED 30% PROXIMAL NARROWING IN THE RIGHT  CORONARY ARTERY  . Transthoracic echocardiogram  02/22/2010    EF 55-60%  . Hernia repair      as an adult- R inguinal     Current Outpatient Prescriptions  Medication Sig Dispense Refill  . aspirin 81 MG chewable tablet Chew 1 tablet (81 mg total) by mouth daily. 30 tablet 1  . atorvastatin (LIPITOR) 20 MG tablet TAKE 1 TABLET BY MOUTH DAILY 90 tablet 1  . buPROPion (WELLBUTRIN SR) 150 MG 12 hr tablet Take 150 mg by mouth daily.     . colchicine 0.6 MG tablet Take 1 tablet (0.6 mg total) by  mouth 2 (two) times daily. 60 tablet 2  . cyanocobalamin 500 MCG tablet Take 1 tablet (500 mcg total) by mouth daily. 30 tablet 0  . metoprolol tartrate (LOPRESSOR) 25 MG tablet Take 1/2 tablet twice a day 45 tablet 3  . pantoprazole (PROTONIX) 40 MG tablet TAKE 1 TABLET BY MOUTH TWICE DAILY 60 tablet 0  . Tamsulosin HCl (FLOMAX) 0.4 MG CAPS Take 0.4 mg by mouth daily with supper.     . temazepam (RESTORIL) 30 MG capsule Take 30 mg by mouth at bedtime as needed for sleep.    Marland Kitchen venlafaxine XR (EFFEXOR-XR) 150 MG 24 hr capsule Take 1 capsule by mouth daily.  1   No current facility-administered medications for this visit.    Allergies:    Allergies  Allergen Reactions  . Sertraline Hcl Other (See Comments)    Patient does not remember the reaction    Social History:  The patient  reports that he  quit smoking about 20 years ago. His smoking use included Cigarettes. He has a 120 pack-year smoking history. He has never used smokeless tobacco. He reports that he does not drink alcohol or use illicit drugs.   ROS:  Please see the history of present illness.   Denies any syncope, bleeding, orthopnea, PND    PHYSICAL EXAM: VS:  BP 126/80 mmHg  Pulse 74  Ht '5\' 10"'$  (1.778 m)  Wt 195 lb 6.4 oz (88.633 kg)  BMI 28.04 kg/m2  SpO2 99% Well nourished, well developed, in no acute distress HEENT: Multiple excoriations on nose Neck: no JVD Cardiac:  normal S1, S2; RRR; no murmur Lungs:   + wheezing,no  rhonchi or rales Abd: soft, nontender, no hepatomegaly Ext: no edema Skin: warm and dry Neuro: no focal abnormalities noted  Nuclear stress test 07/19/12-basal to mid lateral wall fixed defect consistent with infarct. No ischemia, EF 58%.  EKG: 02/24/14-demonstrates sinus rhythm, right bundle branch block, first degree AV block, PR interval 241m. Inferior infarct patternNo significant change from prior  ASSESSMENT AND PLAN:  1. Possible pericarditis - seen in the hospital by Dr. ROval Linsey  Colchicine. We will continue. Look out for signs of GI discomfort. 2. Thrombocytopenia-reviewed Dr. ATommy Rainwaternote. He states that "I don't see any problems with him being on aspirin. I probably would not put him on Plavix. Colchicine should be okay. 3. History of Lung cancer-currently maxed out on radiation therapy. Recent CT scan. Been monitored closely by oncology/radiation oncology. Used to see Dr. MWeber Cooks Now sees Enniver. 4. Coronary artery disease-no active anginal symptoms. Exercising well.  5. Prior PCI's-no recent anginal symptoms. 6. Hyperlipidemia-continue with statin therapy. 7. Former tobacco use 8. COPD-Dr. CGwenette Greetprior, I do appreciate some active wheezing. Continue with inhalers 9. 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 Old pericardial effusion-prior window. 11. I told him  to contact me if any further cardiovascular needs are needed in the meantime, I will see him back in 6 months. Continue to walk, exercise. This will help him with his energy levels.  Signed, MCandee Furbish MD [facility] 12/25/2014 4:15 PM

## 2014-12-25 NOTE — Patient Instructions (Signed)
Medication Instructions:  Your physician recommends that you continue on your current medications as directed. Please refer to the Current Medication list given to you today.  Follow-Up: Follow up in 6 months with Dr. Marlou Porch.  You will receive a letter in the mail 2 months before you are due.  Please call us when you receive this letter to schedule your follow up appointment.  Thank you for choosing [facility]!!

## 2014-12-28 ENCOUNTER — Ambulatory Visit (HOSPITAL_BASED_OUTPATIENT_CLINIC_OR_DEPARTMENT_OTHER): Payer: 59 | Admitting: Family

## 2014-12-28 ENCOUNTER — Other Ambulatory Visit (HOSPITAL_BASED_OUTPATIENT_CLINIC_OR_DEPARTMENT_OTHER): Payer: Medicare Other

## 2014-12-28 ENCOUNTER — Encounter: Payer: Self-pay | Admitting: Family

## 2014-12-28 ENCOUNTER — Ambulatory Visit (HOSPITAL_BASED_OUTPATIENT_CLINIC_OR_DEPARTMENT_OTHER): Payer: Medicare Other

## 2014-12-28 VITALS — BP 118/67 | HR 65 | Temp 97.8°F | Resp 16 | Ht 70.0 in | Wt 195.0 lb

## 2014-12-28 DIAGNOSIS — Z23 Encounter for immunization: Secondary | ICD-10-CM

## 2014-12-28 DIAGNOSIS — C3490 Malignant neoplasm of unspecified part of unspecified bronchus or lung: Secondary | ICD-10-CM

## 2014-12-28 DIAGNOSIS — C3431 Malignant neoplasm of lower lobe, right bronchus or lung: Secondary | ICD-10-CM

## 2014-12-28 DIAGNOSIS — C3430 Malignant neoplasm of lower lobe, unspecified bronchus or lung: Secondary | ICD-10-CM | POA: Diagnosis not present

## 2014-12-28 DIAGNOSIS — C3492 Malignant neoplasm of unspecified part of left bronchus or lung: Secondary | ICD-10-CM

## 2014-12-28 LAB — CBC WITH DIFFERENTIAL (CANCER CENTER ONLY)
BASO#: 0.1 10*3/uL (ref 0.0–0.2)
BASO%: 1.6 % (ref 0.0–2.0)
EOS%: 3.5 % (ref 0.0–7.0)
Eosinophils Absolute: 0.3 10*3/uL (ref 0.0–0.5)
HEMATOCRIT: 41.3 % (ref 38.7–49.9)
HGB: 13.7 g/dL (ref 13.0–17.1)
LYMPH#: 1.3 10*3/uL (ref 0.9–3.3)
LYMPH%: 17.8 % (ref 14.0–48.0)
MCH: 27.9 pg — ABNORMAL LOW (ref 28.0–33.4)
MCHC: 33.2 g/dL (ref 32.0–35.9)
MCV: 84 fL (ref 82–98)
MONO#: 1.1 10*3/uL — AB (ref 0.1–0.9)
MONO%: 15.1 % — ABNORMAL HIGH (ref 0.0–13.0)
NEUT#: 4.6 10*3/uL (ref 1.5–6.5)
NEUT%: 62 % (ref 40.0–80.0)
PLATELETS: 80 10*3/uL — AB (ref 145–400)
RBC: 4.91 10*6/uL (ref 4.20–5.70)
RDW: 15 % (ref 11.1–15.7)
WBC: 7.5 10*3/uL (ref 4.0–10.0)

## 2014-12-28 LAB — CMP (CANCER CENTER ONLY)
ALBUMIN: 3.8 g/dL (ref 3.3–5.5)
ALK PHOS: 112 U/L — AB (ref 26–84)
ALT: 19 U/L (ref 10–47)
AST: 26 U/L (ref 11–38)
BILIRUBIN TOTAL: 1 mg/dL (ref 0.20–1.60)
BUN, Bld: 14 mg/dL (ref 7–22)
CALCIUM: 9 mg/dL (ref 8.0–10.3)
CO2: 28 mEq/L (ref 18–33)
CREATININE: 0.8 mg/dL (ref 0.6–1.2)
Chloride: 104 mEq/L (ref 98–108)
GLUCOSE: 86 mg/dL (ref 73–118)
Potassium: 3.9 mEq/L (ref 3.3–4.7)
SODIUM: 139 meq/L (ref 128–145)
Total Protein: 7 g/dL (ref 6.4–8.1)

## 2014-12-28 MED ORDER — SODIUM CHLORIDE 0.9 % IJ SOLN
10.0000 mL | INTRAMUSCULAR | Status: DC | PRN
  Administered 2014-12-28: 10 mL via INTRAVENOUS
  Filled 2014-12-28: qty 10

## 2014-12-28 MED ORDER — INFLUENZA VAC SPLIT QUAD 0.5 ML IM SUSY
0.5000 mL | PREFILLED_SYRINGE | Freq: Once | INTRAMUSCULAR | Status: AC
  Administered 2014-12-28: 0.5 mL via INTRAMUSCULAR
  Filled 2014-12-28: qty 0.5

## 2014-12-28 MED ORDER — HEPARIN SOD (PORK) LOCK FLUSH 100 UNIT/ML IV SOLN
500.0000 [IU] | Freq: Once | INTRAVENOUS | Status: AC
  Administered 2014-12-28: 500 [IU] via INTRAVENOUS
  Filled 2014-12-28: qty 5

## 2014-12-28 MED ORDER — MECLIZINE HCL 12.5 MG PO TABS: 12.5000 mg | ORAL_TABLET | Freq: Three times a day (TID) | ORAL | Status: DC | PRN

## 2014-12-28 NOTE — Patient Instructions (Signed)
Implanted Port Insertion, Care After Refer to this sheet in the next few weeks. These instructions provide you with information on caring for yourself after your procedure. Your health care provider may also give you more specific instructions. Your treatment has been planned according to current medical practices, but problems sometimes occur. Call your health care provider if you have any problems or questions after your procedure. WHAT TO EXPECT AFTER THE PROCEDURE After your procedure, it is typical to have the following:   Discomfort at the port insertion site. Ice packs to the area will help.  Bruising on the skin over the port. This will subside in 3-4 days. HOME CARE INSTRUCTIONS  After your port is placed, you will get a manufacturer's information card. The card has information about your port. Keep this card with you at all times.   Know what kind of port you have. There are many types of ports available.   Wear a medical alert bracelet in case of an emergency. This can help alert health care workers that you have a port.   The port can stay in for as long as your health care provider believes it is necessary.   A home health care nurse may give medicines and take care of the port.   You or a family member can get special training and directions for giving medicine and taking care of the port at home.  SEEK MEDICAL CARE IF:   Your port does not flush or you are unable to get a blood return.   You have a fever or chills. SEEK IMMEDIATE MEDICAL CARE IF:  You have new fluid or pus coming from your incision.   You notice a bad smell coming from your incision site.   You have swelling, pain, or more redness at the incision or port site.   You have chest pain or shortness of breath. Document Released: 01/12/2013 Document Revised: 03/29/2013 Document Reviewed: 01/12/2013 ExitCare Patient Information 2015 [employer]. This information is not intended to replace  advice given to you by your health care provider. Make sure you discuss any questions you have with your health care provider.  

## 2014-12-28 NOTE — Progress Notes (Signed)
Hematology and Oncology Follow Up Visit  [name] Fields [id number] December 26, 1934 80 y.o. 12/28/2014   Principle Diagnosis:  1. 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 He is doing well and has had no new issues since we saw him last.  CT of the chest in August showed stable appearance in the chest with stable pulmonary nodules.  He had a bone marrow biopsy last week. Aspirate had megakarayocytic hyperplasia. Flow cytometry was negative.  His platelet count today is now up to 80. He has had no issue with bleeding or bruising.  He is now off plavix and taking aspirin daily.  He states that he still follows up with dermatology yearly for skin checks.  He has had some mild SOB with exertion but this is "normal" for him and no worse.  He denies having had fever, chills, n/v, cough, rash, SOB, chest pain, palpitations, abdominal pain, changes in bowel or bladder habits. He has constipation at times but states that this is relieved with Linzess,  He has had some issues with vertigo and states that he does not have a prescription for Antivert. He hash ad no swelling, tenderness, numbness or tingling in his extremities.  He is eating well with a good appetite. He stays hydrated. His weight is stable.   Medications:    Medication List       This list is accurate as of: 12/28/14  4:28 PM.  Always use your most recent med list.               aspirin 81 MG chewable tablet  Chew 1 tablet (81 mg total) by mouth daily.     atorvastatin 20 MG tablet  Commonly known as:  LIPITOR  TAKE 1 TABLET BY MOUTH DAILY     buPROPion 150 MG 12 hr tablet  Commonly known as:  WELLBUTRIN SR  Take 150 mg by mouth daily.     colchicine 0.6 MG tablet  Take 1 tablet  (0.6 mg total) by mouth 2 (two) times daily.     cyanocobalamin 500 MCG tablet  Take 1 tablet (500 mcg total) by mouth daily.     meclizine 12.5 MG tablet  Commonly known as:  ANTIVERT  Take 1 tablet (12.5 mg total) by mouth 3 (three) times daily as needed for dizziness.     metoprolol tartrate 25 MG tablet  Commonly known as:  LOPRESSOR  Take 1/2 tablet twice a day     pantoprazole 40 MG tablet  Commonly known as:  PROTONIX  TAKE 1 TABLET BY MOUTH TWICE DAILY     tamsulosin 0.4 MG Caps capsule  Commonly known as:  FLOMAX  Take 0.4 mg by mouth daily with supper.     temazepam 30 MG capsule  Commonly known as:  RESTORIL  Take 30 mg by mouth at bedtime as needed for sleep.     venlafaxine XR 150 MG 24 hr capsule  Commonly known as:  EFFEXOR-XR  Take 1 capsule by mouth daily.        Allergies:  Allergies  Allergen Reactions  . Sertraline Hcl Other (See Comments)    Patient does not remember the reaction    Past Medical History, Surgical history, Social history,  and Family History were reviewed and updated.  Review of Systems: All other 10 point review of systems is negative.   Physical Exam:  height is 5' 10" (1.778 m) and weight is 195 lb (88.451 kg). His oral temperature is 97.8 F (36.6 C). His blood pressure is 118/67 and his pulse is 65. His respiration is 16.   Wt Readings from Last 3 Encounters:  12/28/14 195 lb (88.451 kg)  12/25/14 195 lb 6.4 oz (88.633 kg)  12/22/14 191 lb 12.8 oz (87 kg)    Ocular: Sclerae unicteric, pupils equal, round and reactive to light Ear-nose-throat: Oropharynx clear, dentition fair Lymphatic: No cervical or supraclavicular adenopathy Lungs no rales or rhonchi, good excursion bilaterally Heart regular rate and rhythm, no murmur appreciated Abd soft, nontender, positive bowel sounds MSK no focal spinal tenderness, no joint edema Neuro: non-focal, well-oriented, appropriate affect Breasts: Deferred  Lab Results  Component  Value Date   WBC 7.5 12/28/2014   HGB 13.7 12/28/2014   HCT 41.3 12/28/2014   MCV 84 12/28/2014   PLT 80* 12/28/2014   Lab Results  Component Value Date   FERRITIN 92 12/20/2014   IRON 95 12/20/2014   TIBC 405 12/20/2014   UIBC 310 12/20/2014   IRONPCTSAT 23 12/20/2014   Lab Results  Component Value Date   RETICCTPCT 1.8 12/20/2014   RBC 4.91 12/28/2014   Lab Results  Component Value Date   KAPLAMBRATIO 1.98* 12/20/2014   No results found for: Kandis Cocking, IGMSERUM Lab Results  Component Value Date   TOTALPROTELP 6.9 12/20/2014   ALBUMINELP 3.4 12/20/2014   A1GS 0.3 12/20/2014   A2GS 0.9 12/20/2014   BETS 1.3 12/20/2014   GAMS 1.1 12/20/2014   MSPIKE Not Observed 12/20/2014   SPEI Comment 12/20/2014     Chemistry      Component Value Date/Time   NA 139 12/28/2014 1349   NA 139 12/20/2014 0338   NA 142 07/18/2013 0851   K 3.9 12/28/2014 1349   K 3.8 12/20/2014 0338   K 5.0 07/18/2013 0851   CL 104 12/28/2014 1349   CL 104 12/20/2014 0338   CL 104 08/27/2012 1421   CO2 28 12/28/2014 1349   CO2 26 12/20/2014 0338   CO2 28 07/18/2013 0851   BUN 14 12/28/2014 1349   BUN 12 12/20/2014 0338   BUN 15.6 05/15/2014 0908   CREATININE 0.8 12/28/2014 1349   CREATININE 0.84 12/20/2014 0338   CREATININE 0.9 05/15/2014 0908      Component Value Date/Time   CALCIUM 9.0 12/28/2014 1349   CALCIUM 9.2 12/20/2014 0338   CALCIUM 9.8 07/18/2013 0851   ALKPHOS 112* 12/28/2014 1349   ALKPHOS 113 12/20/2014 0338   ALKPHOS 193* 07/18/2013 0851   AST 26 12/28/2014 1349   AST 24 12/20/2014 0338   AST 25 07/18/2013 0851   ALT 19 12/28/2014 1349   ALT 15* 12/20/2014 0338   ALT 23 07/18/2013 0851   BILITOT 1.00 12/28/2014 1349   BILITOT 1.3* 12/20/2014 0338   BILITOT 1.14 07/18/2013 0851     Impression and Plan: [name] Fields is 80 year old white male history of lung cancer and melanoma. These all appear to be stable at this time.  His most recent Ct scan of the chest  was showed stable nodules. He is followed annually by dermatology for skin assessments.  He was recently hospitalized with thrombocytopenia. His Bone marrow aspirate and flow cytometry were negative for malignancy. He appears to have ITP. His platelet count  is slowly coming up with his count being 80 today.  He is off of Plavix at this time and taking 1 baby aspirin daily. There have been no episodes or bleeding or bruising.  I did send a prescription for Antivert to his pharmacy for the vertigo.  We will plan to do a chest x-ray on him in 6 months.  We will see him back in 3 months for labs and follow-up.  He knows to contact us with any questions or concerns. We can certainly see him sooner if need be.   Eliezer Bottom, NP 9/22/20164:28 PM

## 2015-01-01 DIAGNOSIS — I319 Disease of pericardium, unspecified: Secondary | ICD-10-CM | POA: Diagnosis not present

## 2015-01-01 DIAGNOSIS — I10 Essential (primary) hypertension: Secondary | ICD-10-CM | POA: Diagnosis not present

## 2015-01-01 DIAGNOSIS — J449 Chronic obstructive pulmonary disease, unspecified: Secondary | ICD-10-CM | POA: Diagnosis not present

## 2015-01-01 DIAGNOSIS — E782 Mixed hyperlipidemia: Secondary | ICD-10-CM | POA: Diagnosis not present

## 2015-01-01 DIAGNOSIS — I2510 Atherosclerotic heart disease of native coronary artery without angina pectoris: Secondary | ICD-10-CM | POA: Diagnosis not present

## 2015-01-01 DIAGNOSIS — F329 Major depressive disorder, single episode, unspecified: Secondary | ICD-10-CM | POA: Diagnosis not present

## 2015-01-02 LAB — CHROMOSOME ANALYSIS, BONE MARROW

## 2015-01-04 ENCOUNTER — Other Ambulatory Visit: Payer: 59

## 2015-01-04 ENCOUNTER — Ambulatory Visit: Payer: 59 | Admitting: Family

## 2015-01-09 ENCOUNTER — Encounter (HOSPITAL_COMMUNITY): Payer: Self-pay

## 2015-01-25 ENCOUNTER — Other Ambulatory Visit: Payer: Self-pay | Admitting: Gastroenterology

## 2015-01-25 ENCOUNTER — Encounter (HOSPITAL_COMMUNITY): Payer: Self-pay | Admitting: *Deleted

## 2015-01-25 DIAGNOSIS — Z1211 Encounter for screening for malignant neoplasm of colon: Secondary | ICD-10-CM | POA: Diagnosis not present

## 2015-01-25 DIAGNOSIS — D696 Thrombocytopenia, unspecified: Secondary | ICD-10-CM | POA: Diagnosis not present

## 2015-01-30 ENCOUNTER — Encounter (HOSPITAL_COMMUNITY): Payer: Self-pay | Admitting: Anesthesiology

## 2015-01-30 ENCOUNTER — Encounter (HOSPITAL_COMMUNITY)
Admission: RE | Disposition: A | Discharge status: Home / Self Care | Payer: Self-pay | Source: Ambulatory Visit | Attending: Gastroenterology

## 2015-01-30 ENCOUNTER — Ambulatory Visit (HOSPITAL_COMMUNITY): Payer: Medicare Other | Admitting: Anesthesiology

## 2015-01-30 ENCOUNTER — Ambulatory Visit (HOSPITAL_COMMUNITY)
Admission: RE | Admit: 2015-01-30 | Discharge: 2015-01-30 | Disposition: A | Discharge status: Home / Self Care | Payer: Medicare Other | Source: Ambulatory Visit | Attending: Gastroenterology | Admitting: Gastroenterology

## 2015-01-30 DIAGNOSIS — Z7982 Long term (current) use of aspirin: Secondary | ICD-10-CM | POA: Insufficient documentation

## 2015-01-30 DIAGNOSIS — I2510 Atherosclerotic heart disease of native coronary artery without angina pectoris: Secondary | ICD-10-CM | POA: Diagnosis not present

## 2015-01-30 DIAGNOSIS — D696 Thrombocytopenia, unspecified: Secondary | ICD-10-CM | POA: Diagnosis not present

## 2015-01-30 DIAGNOSIS — E7800 Pure hypercholesterolemia, unspecified: Secondary | ICD-10-CM | POA: Insufficient documentation

## 2015-01-30 DIAGNOSIS — Z955 Presence of coronary angioplasty implant and graft: Secondary | ICD-10-CM | POA: Diagnosis not present

## 2015-01-30 DIAGNOSIS — I10 Essential (primary) hypertension: Secondary | ICD-10-CM | POA: Insufficient documentation

## 2015-01-30 DIAGNOSIS — K635 Polyp of colon: Secondary | ICD-10-CM | POA: Diagnosis not present

## 2015-01-30 DIAGNOSIS — M1990 Unspecified osteoarthritis, unspecified site: Secondary | ICD-10-CM | POA: Insufficient documentation

## 2015-01-30 DIAGNOSIS — Z87891 Personal history of nicotine dependence: Secondary | ICD-10-CM | POA: Insufficient documentation

## 2015-01-30 DIAGNOSIS — J449 Chronic obstructive pulmonary disease, unspecified: Secondary | ICD-10-CM | POA: Insufficient documentation

## 2015-01-30 DIAGNOSIS — Z79899 Other long term (current) drug therapy: Secondary | ICD-10-CM | POA: Diagnosis not present

## 2015-01-30 DIAGNOSIS — K219 Gastro-esophageal reflux disease without esophagitis: Secondary | ICD-10-CM | POA: Insufficient documentation

## 2015-01-30 DIAGNOSIS — Z85118 Personal history of other malignant neoplasm of bronchus and lung: Secondary | ICD-10-CM | POA: Diagnosis not present

## 2015-01-30 DIAGNOSIS — E739 Lactose intolerance, unspecified: Secondary | ICD-10-CM | POA: Diagnosis not present

## 2015-01-30 DIAGNOSIS — K5730 Diverticulosis of large intestine without perforation or abscess without bleeding: Secondary | ICD-10-CM | POA: Insufficient documentation

## 2015-01-30 DIAGNOSIS — Z85820 Personal history of malignant melanoma of skin: Secondary | ICD-10-CM | POA: Insufficient documentation

## 2015-01-30 DIAGNOSIS — Z8711 Personal history of peptic ulcer disease: Secondary | ICD-10-CM | POA: Insufficient documentation

## 2015-01-30 DIAGNOSIS — I252 Old myocardial infarction: Secondary | ICD-10-CM | POA: Insufficient documentation

## 2015-01-30 DIAGNOSIS — Z1211 Encounter for screening for malignant neoplasm of colon: Secondary | ICD-10-CM | POA: Diagnosis not present

## 2015-01-30 DIAGNOSIS — N400 Enlarged prostate without lower urinary tract symptoms: Secondary | ICD-10-CM | POA: Diagnosis not present

## 2015-01-30 DIAGNOSIS — H9190 Unspecified hearing loss, unspecified ear: Secondary | ICD-10-CM | POA: Diagnosis not present

## 2015-01-30 DIAGNOSIS — D122 Benign neoplasm of ascending colon: Secondary | ICD-10-CM | POA: Diagnosis not present

## 2015-01-30 DIAGNOSIS — R195 Other fecal abnormalities: Secondary | ICD-10-CM | POA: Insufficient documentation

## 2015-01-30 HISTORY — PX: COLONOSCOPY WITH PROPOFOL: SHX5780

## 2015-01-30 HISTORY — DX: Dizziness and giddiness: R42

## 2015-01-30 SURGERY — COLONOSCOPY WITH PROPOFOL
Anesthesia: Monitor Anesthesia Care

## 2015-01-30 MED ORDER — LACTATED RINGERS IV SOLN
INTRAVENOUS | Status: DC
  Administered 2015-01-30: 07:00:00 via INTRAVENOUS

## 2015-01-30 MED ORDER — PROPOFOL 10 MG/ML IV BOLUS
INTRAVENOUS | Status: AC
  Filled 2015-01-30: qty 20

## 2015-01-30 MED ORDER — LIDOCAINE HCL (CARDIAC) 20 MG/ML IV SOLN: INTRAVENOUS | Status: DC | PRN

## 2015-01-30 MED ORDER — PROPOFOL 10 MG/ML IV BOLUS
INTRAVENOUS | Status: DC | PRN
  Administered 2015-01-30 (×3): 30 mg via INTRAVENOUS
  Administered 2015-01-30: 70 mg via INTRAVENOUS
  Administered 2015-01-30 (×2): 20 mg via INTRAVENOUS

## 2015-01-30 SURGICAL SUPPLY — 21 items

## 2015-01-30 NOTE — H&P (Signed)
  Procedure: Diagnostic colonoscopy to evaluate positive Cologuard stool DNA test  History: The patient is a 80 year old male born [birth year]. He is scheduled to undergo diagnostic colonoscopy to evaluate positive Cologuard stool DNA test. 01/25/2015 CBC was performed and showed a white blood cell count 6600, hemoglobin 13.6 g, and platelet count 61,000. 12/19/2014 CT scan of the abdomen and pelvis was performed which showed gallstones, pericardial effusion, right lower lobe lung infiltrate or atelectasis, and aortic atherosclerosis.  Past medical history: Osteoarthritis. Benign prostatic hypertrophy. Gastroesophageal reflux. Peptic ulcer disease. Allergic rhinitis. Colonic diverticulosis. Lactose intolerance. Myocardial infarction in April 2001. Circumflex coronary artery stent placed in April 2001. Hypercholesterolemia. Hypertension. Severe hearing loss. Left upper lobe non-small cell lung cancer diagnosed in November 2010. Mild left ventricular hypertrophy with diastolic left ventricular dysfunction. Left ventricular ejection fraction 60%. Chronic obstructive pulmonary disease. Left knee surgery. Right inguinal herniorrhaphy. Left submandibular melanoma surgery.  Medication allergies: Zoloft caused decreased libido  Exam: The patient is alert and lying comfortably on the endoscopy stretcher. Abdomen is soft and nontender to palpation. Lungs are clear to auscultation. Cardiac exam reveals a regular rhythm.  Plan: Proceed with diagnostic colonoscopy to evaluate positive Cologuard stool DNA test

## 2015-01-30 NOTE — Op Note (Signed)
Problem: Positive Cologuard stool DNA test  Endoscopist: Earle Gell  Premedication: Propofol administered by anesthesia  Procedure: Diagnostic colonoscopy The patient was placed in the left lateral decubitus position. Anal inspection and digital rectal exam were normal. The Pentax pediatric colonoscope was introduced into the rectum and advanced to the cecum. A normal-appearing ileocecal valve and appendiceal orifice were identified. Colonic preparation for the exam today was good. Withdrawal time was 14 minutes  Rectum. Normal. Retroflexed view of the distal rectum was normal  Sigmoid colon and descending colon. Colonic diverticulosis  Splenic flexure. Normal  Transverse colon. Normal  Hepatic flexure. Normal  Ascending colon. From the proximal ascending colon, a 5 mm sessile polyp was removed with the cold snare and an Endo Clip was applied to the polypectomy site to prevent bleeding. The patient's platelet count was 61,000  Cecum and ileocecal valve. Normal  Assessment: A small polyp was removed from the proximal ascending colon. Otherwise normal diagnostic colonoscopy.

## 2015-01-30 NOTE — Transfer of Care (Signed)
Immediate Anesthesia Transfer of Care Note  Patient: [name] Fields  Procedure(s) Performed: Procedure(s): COLONOSCOPY WITH PROPOFOL (N/A)  Patient Location: PACU  Anesthesia Type:MAC  Level of Consciousness: awake, alert  and oriented  Airway & Oxygen Therapy: Patient Spontanous Breathing and Patient connected to face mask oxygen  Post-op Assessment: Report given to RN and Post -op Vital signs reviewed and stable  Post vital signs: Reviewed and stable  Last Vitals:  Filed Vitals:   01/30/15 0704  BP: 102/67  Pulse: 56  Temp: 36.7 C  Resp: 18    Complications: No apparent anesthesia complications

## 2015-01-30 NOTE — Anesthesia Postprocedure Evaluation (Signed)
  Anesthesia Post-op Note  Patient: [name] Fields  Procedure(s) Performed: Procedure(s) (LRB): COLONOSCOPY WITH PROPOFOL (N/A)  Patient Location: PACU  Anesthesia Type: MAC  Level of Consciousness: awake and alert   Airway and Oxygen Therapy: Patient Spontanous Breathing  Post-op Pain: mild  Post-op Assessment: Post-op Vital signs reviewed, Patient's Cardiovascular Status Stable, Respiratory Function Stable, Patent Airway and No signs of Nausea or vomiting  Last Vitals:  Filed Vitals:   01/30/15 0840  BP: 117/66  Pulse: 68  Temp:   Resp: 16    Post-op Vital Signs: stable   Complications: No apparent anesthesia complications

## 2015-01-30 NOTE — Anesthesia Preprocedure Evaluation (Addendum)
Anesthesia Evaluation  Patient identified by MRN, date of birth, ID band Patient awake    Reviewed: Allergy & Precautions, NPO status , Patient's Chart, lab work & pertinent test results  Airway Mallampati: II  TM Distance: >3 FB Neck ROM: Full    Dental no notable dental hx.    Pulmonary shortness of breath, COPD, former smoker,    Pulmonary exam normal breath sounds clear to auscultation       Cardiovascular hypertension, Pt. on medications and Pt. on home beta blockers + CAD and + Past MI  Normal cardiovascular exam Rhythm:Regular Rate:Normal     Neuro/Psych negative neurological ROS  negative psych ROS   GI/Hepatic Neg liver ROS, GERD  ,  Endo/Other  negative endocrine ROS  Renal/GU negative Renal ROS  negative genitourinary   Musculoskeletal  (+) Arthritis ,   Abdominal   Peds negative pediatric ROS (+)  Hematology negative hematology ROS (+)   Anesthesia Other Findings   Reproductive/Obstetrics negative OB ROS                            Anesthesia Physical Anesthesia Plan  ASA: III  Anesthesia Plan: MAC   Post-op Pain Management:    Induction: Intravenous  Airway Management Planned: Natural Airway  Additional Equipment:   Intra-op Plan:   Post-operative Plan:   Informed Consent: I have reviewed the patients History and Physical, chart, labs and discussed the procedure including the risks, benefits and alternatives for the proposed anesthesia with the patient or authorized representative who has indicated his/her understanding and acceptance.   Dental advisory given  Plan Discussed with: CRNA  Anesthesia Plan Comments:         Anesthesia Quick Evaluation

## 2015-01-30 NOTE — Discharge Instructions (Signed)

## 2015-01-31 ENCOUNTER — Encounter (HOSPITAL_COMMUNITY): Payer: Self-pay | Admitting: Gastroenterology

## 2015-02-05 DIAGNOSIS — D696 Thrombocytopenia, unspecified: Secondary | ICD-10-CM | POA: Diagnosis not present

## 2015-02-22 DIAGNOSIS — D696 Thrombocytopenia, unspecified: Secondary | ICD-10-CM | POA: Diagnosis not present

## 2015-03-06 ENCOUNTER — Ambulatory Visit (HOSPITAL_BASED_OUTPATIENT_CLINIC_OR_DEPARTMENT_OTHER): Payer: Medicare Other

## 2015-03-06 ENCOUNTER — Other Ambulatory Visit (HOSPITAL_BASED_OUTPATIENT_CLINIC_OR_DEPARTMENT_OTHER): Payer: Medicare Other

## 2015-03-06 ENCOUNTER — Encounter: Payer: Self-pay | Admitting: Family

## 2015-03-06 ENCOUNTER — Ambulatory Visit (HOSPITAL_BASED_OUTPATIENT_CLINIC_OR_DEPARTMENT_OTHER): Payer: Medicare Other | Admitting: Family

## 2015-03-06 ENCOUNTER — Ambulatory Visit: Payer: Medicare Other | Admitting: Cardiology

## 2015-03-06 VITALS — BP 126/69 | HR 62 | Temp 97.6°F | Resp 20 | Ht 70.0 in | Wt 195.0 lb

## 2015-03-06 DIAGNOSIS — C3492 Malignant neoplasm of unspecified part of left bronchus or lung: Secondary | ICD-10-CM

## 2015-03-06 DIAGNOSIS — C3431 Malignant neoplasm of lower lobe, right bronchus or lung: Secondary | ICD-10-CM

## 2015-03-06 DIAGNOSIS — D693 Immune thrombocytopenic purpura: Secondary | ICD-10-CM | POA: Diagnosis not present

## 2015-03-06 DIAGNOSIS — C3490 Malignant neoplasm of unspecified part of unspecified bronchus or lung: Secondary | ICD-10-CM

## 2015-03-06 DIAGNOSIS — D696 Thrombocytopenia, unspecified: Secondary | ICD-10-CM

## 2015-03-06 LAB — CBC WITH DIFFERENTIAL (CANCER CENTER ONLY)
BASO#: 0.1 10*3/uL (ref 0.0–0.2)
BASO%: 1.1 % (ref 0.0–2.0)
EOS%: 2.6 % (ref 0.0–7.0)
Eosinophils Absolute: 0.2 10*3/uL (ref 0.0–0.5)
HCT: 42.3 % (ref 38.7–49.9)
HEMOGLOBIN: 13.9 g/dL (ref 13.0–17.1)
LYMPH#: 1.3 10*3/uL (ref 0.9–3.3)
LYMPH%: 16.9 % (ref 14.0–48.0)
MCH: 27.4 pg — ABNORMAL LOW (ref 28.0–33.4)
MCHC: 32.9 g/dL (ref 32.0–35.9)
MCV: 83 fL (ref 82–98)
MONO#: 0.9 10*3/uL (ref 0.1–0.9)
MONO%: 11.2 % (ref 0.0–13.0)
NEUT%: 68.2 % (ref 40.0–80.0)
NEUTROS ABS: 5.3 10*3/uL (ref 1.5–6.5)
Platelets: 90 10*3/uL — ABNORMAL LOW (ref 145–400)
RBC: 5.07 10*6/uL (ref 4.20–5.70)
RDW: 16.7 % — ABNORMAL HIGH (ref 11.1–15.7)
WBC: 7.8 10*3/uL (ref 4.0–10.0)

## 2015-03-06 LAB — COMPREHENSIVE METABOLIC PANEL (CC13)
ALT: 15 U/L (ref 0–55)
AST: 23 U/L (ref 5–34)
Albumin: 3.7 g/dL (ref 3.5–5.0)
Alkaline Phosphatase: 135 U/L (ref 40–150)
Anion Gap: 7 mEq/L (ref 3–11)
BILIRUBIN TOTAL: 1.08 mg/dL (ref 0.20–1.20)
BUN: 18 mg/dL (ref 7.0–26.0)
CO2: 28 meq/L (ref 22–29)
Calcium: 9.5 mg/dL (ref 8.4–10.4)
Chloride: 106 mEq/L (ref 98–109)
Creatinine: 1 mg/dL (ref 0.7–1.3)
EGFR: 74 mL/min/{1.73_m2} — AB (ref >90)
GLUCOSE: 90 mg/dL (ref 70–140)
Potassium: 4.9 mEq/L (ref 3.5–5.1)
SODIUM: 141 meq/L (ref 136–145)
TOTAL PROTEIN: 7.2 g/dL (ref 6.4–8.3)

## 2015-03-06 LAB — LACTATE DEHYDROGENASE (CC13): LDH: 180 U/L (ref 125–245)

## 2015-03-06 MED ORDER — HEPARIN SOD (PORK) LOCK FLUSH 100 UNIT/ML IV SOLN
500.0000 [IU] | Freq: Once | INTRAVENOUS | Status: AC
  Administered 2015-03-06: 500 [IU] via INTRAVENOUS
  Filled 2015-03-06: qty 5

## 2015-03-06 MED ORDER — SODIUM CHLORIDE 0.9 % IJ SOLN
10.0000 mL | INTRAMUSCULAR | Status: DC | PRN
  Administered 2015-03-06: 10 mL via INTRAVENOUS
  Filled 2015-03-06: qty 10

## 2015-03-06 NOTE — Progress Notes (Signed)
Hematology and Oncology Follow Up Visit  [name] Fields [id number] 1935/04/06 80 y.o. 03/06/2015   Principle Diagnosis:  1. 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 Cumming is here today with his wife for a follow-up. He is doing fairly well. He did have a recent fall due to an episode of vertigo. Thankfully he was not injured. He has not been taking Antivert with these episodes but will start carrying it with him. He states that he has not followed up with dermatology in a while. He denies having any new lesions. He does have a few crusted over areas on his chest. There are no vesicles at this time. He has a history of shingles and has some post herpetic neuralgia pain due to this. He also had shingles in the left eye which effected his vision.  His platelet count today is now up to 90. No bleeding, bruising or petechiae.  He has had some sinus drainage.  No fever, chills, n/v, cough, rash, SOB, chest pain, palpitations, abdominal pain, changes in bowel or bladder habits.  He has had no swelling, tenderness or tingling in his extremities. He has had some numbness at times in the right hand when he uses his cane. This passes if he stops to rest. No new joint aches or pains.  He is eating healthy and staying well hydrated. His weight is unchanged.   Medications:    Medication List       This list is accurate as of: 03/06/15  2:51 PM.  Always use your most recent med list.               aspirin 81 MG chewable tablet  Chew 1 tablet (81 mg total) by mouth daily.     atorvastatin 20 MG tablet  Commonly known as:  LIPITOR  TAKE 1 TABLET BY MOUTH DAILY     buPROPion 150 MG 12 hr tablet  Commonly known as:  WELLBUTRIN SR  Take 150 mg by mouth daily.     colchicine 0.6 MG  tablet  Take 1 tablet (0.6 mg total) by mouth 2 (two) times daily.     cyanocobalamin 500 MCG tablet  Take 1 tablet (500 mcg total) by mouth daily.     LINZESS 145 MCG Caps capsule  Generic drug:  Linaclotide  Take 145 mcg by mouth daily as needed (constipation).     meclizine 12.5 MG tablet  Commonly known as:  ANTIVERT  Take 1 tablet (12.5 mg total) by mouth 3 (three) times daily as needed for dizziness.     metoprolol tartrate 25 MG tablet  Commonly known as:  LOPRESSOR  Take 1/2 tablet twice a day     pantoprazole 40 MG tablet  Commonly known as:  PROTONIX  TAKE 1 TABLET BY MOUTH TWICE DAILY     tamsulosin 0.4 MG Caps capsule  Commonly known as:  FLOMAX  Take 0.4 mg by mouth daily with supper.     temazepam 30 MG capsule  Commonly known as:  RESTORIL  Take 30 mg by mouth at bedtime as needed for sleep.     venlafaxine XR 150 MG 24 hr capsule  Commonly known as:  EFFEXOR-XR  Take 1 capsule by mouth daily.  Allergies:  Allergies  Allergen Reactions  . Sertraline Hcl Other (See Comments)    Patient does not remember the reaction    Past Medical History, Surgical history, Social history, and Family History were reviewed and updated.  Review of Systems: All other 10 point review of systems is negative.   Physical Exam:  height is '5\' 10"'$  (1.778 m) and weight is 195 lb (88.451 kg). His oral temperature is 97.6 F (36.4 C). His blood pressure is 126/69 and his pulse is 62. His respiration is 20.   Wt Readings from Last 3 Encounters:  03/06/15 195 lb (88.451 kg)  01/30/15 195 lb (88.451 kg)  12/28/14 195 lb (88.451 kg)    Ocular: Sclerae unicteric, pupils equal, round and reactive to light Ear-nose-throat: Oropharynx clear, dentition fair Lymphatic: No cervical or supraclavicular adenopathy Lungs no rales or rhonchi, good excursion bilaterally Heart regular rate and rhythm, no murmur appreciated Abd soft, nontender, positive bowel sounds MSK no focal  spinal tenderness, no joint edema Neuro: non-focal, well-oriented, appropriate affect Breasts: Deferred  Lab Results  Component Value Date   WBC 7.8 03/06/2015   HGB 13.9 03/06/2015   HCT 42.3 03/06/2015   MCV 83 03/06/2015   PLT 90* 03/06/2015   Lab Results  Component Value Date   FERRITIN 92 12/20/2014   IRON 95 12/20/2014   TIBC 405 12/20/2014   UIBC 310 12/20/2014   IRONPCTSAT 23 12/20/2014   Lab Results  Component Value Date   RETICCTPCT 1.8 12/20/2014   RBC 5.07 03/06/2015   Lab Results  Component Value Date   KAPLAMBRATIO 1.98* 12/20/2014   No results found for: Kandis Cocking, IGMSERUM Lab Results  Component Value Date   TOTALPROTELP 6.9 12/20/2014   ALBUMINELP 3.4 12/20/2014   A1GS 0.3 12/20/2014   A2GS 0.9 12/20/2014   BETS 1.3 12/20/2014   GAMS 1.1 12/20/2014   MSPIKE Not Observed 12/20/2014   SPEI Comment 12/20/2014     Chemistry      Component Value Date/Time   NA 141 03/06/2015 1015   NA 139 12/28/2014 1349   NA 139 12/20/2014 0338   K 4.9 03/06/2015 1015   K 3.9 12/28/2014 1349   K 3.8 12/20/2014 0338   CL 104 12/28/2014 1349   CL 104 12/20/2014 0338   CL 104 08/27/2012 1421   CO2 28 03/06/2015 1015   CO2 28 12/28/2014 1349   CO2 26 12/20/2014 0338   BUN 18.0 03/06/2015 1015   BUN 14 12/28/2014 1349   BUN 12 12/20/2014 0338   CREATININE 1.0 03/06/2015 1015   CREATININE 0.8 12/28/2014 1349   CREATININE 0.84 12/20/2014 0338      Component Value Date/Time   CALCIUM 9.5 03/06/2015 1015   CALCIUM 9.0 12/28/2014 1349   CALCIUM 9.2 12/20/2014 0338   ALKPHOS 135 03/06/2015 1015   ALKPHOS 112* 12/28/2014 1349   ALKPHOS 113 12/20/2014 0338   AST 23 03/06/2015 1015   AST 26 12/28/2014 1349   AST 24 12/20/2014 0338   ALT 15 03/06/2015 1015   ALT 19 12/28/2014 1349   ALT 15* 12/20/2014 0338   BILITOT 1.08 03/06/2015 1015   BILITOT 1.00 12/28/2014 1349   BILITOT 1.3* 12/20/2014 0338     Impression and Plan: Mr. Toscano is 80 year old  white male history of lung cancer and melanoma. So far, he has done well and there has been no evidence of recurrence.  His platelet count is 90 and he has had no issues with bleeding. No  anemia at this time.  I spoke with Dr. Marin Olp regarding the crusted areas on his chest and it was decided to just watch this for now. The patient will let us know if he notices an vesicles indicative of shingles.  We will have him start taking Claritin over the counter daily to help with his sinus drainage.   We will plan to see him back in 3 months for labs and follow-up.  He will contact us with any questions or concerns. We can certainly see him sooner if need be.   Eliezer Bottom, NP 11/29/20162:51 PM

## 2015-03-06 NOTE — Patient Instructions (Signed)
Heparin injection What is this medicine? HEPARIN (HEP a rin) is an anticoagulant. It is used to treat or prevent clots in the veins, arteries, lungs, or heart. It stops clots from forming or getting bigger. This medicine prevents clotting during open-heart surgery, dialysis, or in patients who are confined to bed. This medicine may be used for other purposes; ask your health care provider or pharmacist if you have questions. What should I tell my health care provider before I take this medicine? They need to know if you have any of these conditions: -bleeding disorders, such as hemophilia or low blood platelets -bowel disease or diverticulitis -endocarditis -high blood pressure -liver disease -recent surgery or delivery of a baby -stomach ulcers -an unusual or allergic reaction to heparin, benzyl alcohol, sulfites, other medicines, foods, dyes, or preservatives -pregnant or trying to get pregnant -breast-feeding How should I use this medicine? This medicine is given by injection or infusion into a vein. It can also be given by injection of small amounts under the skin. It is usually given by a health care professional in a hospital or clinic setting. If you get this medicine at home, you will be taught how to prepare and give this medicine. Use exactly as directed. Take your medicine at regular intervals. Do not take it more often than directed. Do not stop taking except on your doctor's advice. Stopping this medicine may increase your risk of a blot clot. Be sure to refill your prescription before you run out of medicine. It is important that you put your used needles and syringes in a special sharps container. Do not put them in a trash can. If you do not have a sharps container, call your pharmacist or healthcare provider to get one. Talk to your pediatrician regarding the use of this medicine in children. While this medicine may be prescribed for children for selected conditions, precautions  do apply. Overdosage: If you think you have taken too much of this medicine contact a poison control center or emergency room at once. NOTE: This medicine is only for you. Do not share this medicine with others. What if I miss a dose? If you miss a dose, take it as soon as you can. If it is almost time for your next dose, take only that dose. Do not take double or extra doses. What may interact with this medicine? Do not take this medicine with any of the following medications: -aspirin and aspirin-like drugs -mifepristone -medicines that treat or prevent blood clots like warfarin, enoxaparin, and dalteparin -palifermin -protamine This medicine may also interact with the following medications: -dextran -digoxin -hydroxychloroquine -medicines for treating colds or allergies -nicotine -NSAIDs, medicines for pain and inflammation, like ibuprofen or naproxen -phenylbutazone -tetracycline antibiotics This list may not describe all possible interactions. Give your health care provider a list of all the medicines, herbs, non-prescription drugs, or dietary supplements you use. Also tell them if you smoke, drink alcohol, or use illegal drugs. Some items may interact with your medicine. What should I watch for while using this medicine? While you are taking this medicine, carry an identification card with your name, the name and dose of medicine(s) being used, and the name and phone number of your doctor or health care professional or person to contact in an emergency. Notify your doctor or health care professional and seek emergency treatment if you develop breathing problems; changes in vision; chest pain; severe, sudden headache; pain, swelling, warmth in the leg; trouble speaking; sudden numbness or  weakness of the face, arm, or leg. These can be signs that your condition has gotten worse. Notify your doctor or health care professional at once if you have cold, blue hands or feet. If you are  going to have surgery or dental work, tell your doctor or health care professional that you have received this medicine. Be careful brushing and flossing your teeth or using a toothpick while receiving this medicine because you may bleed more easily. Avoid sports and activities that might cause injury while you are using this medicine. Severe falls or injuries can cause unseen bleeding. Be careful when using sharp tools or knives. Consider using an Copy. What side effects may I notice from receiving this medicine? Side effects that you should report to your doctor or health care professional as soon as possible: -allergic reactions like skin rash, itching or hives, swelling of the face, lips, or tongue -back pain -burning or itching on the bottoms of the feet -cold, blue, or painful hands and feet -feeling faint or lightheaded, falls -fever, chills -nausea, vomiting -signs and symptoms of bleeding such as bloody or black, tarry stools; red or dark-brown urine; spitting up blood or brown material that looks like coffee grounds; red spots on the skin; unusual bruising or bleeding from the eye, gums, or nose -stomach pain -unusually low blood pressure Side effects that usually do not require medical attention (report to your doctor or health care professional if they continue or are bothersome): -pain, redness, or irritation at site where injected This list may not describe all possible side effects. Call your doctor for medical advice about side effects. You may report side effects to FDA at 1-800-FDA-1088. Where should I keep my medicine? Keep out of the reach of children. Store unopened vials at room temperature between 15 and 30 degrees C (59 and 86 degrees F). Do not freeze. Do not use if solution is discolored or particulate matter is present. Throw away any unused medicine after the expiration date. NOTE: This sheet is a summary. It may not cover all possible information. If you  have questions about this medicine, talk to your doctor, pharmacist, or health care provider.    2016, [employer]. (2012-07-20 16:19:24)

## 2015-03-22 ENCOUNTER — Encounter: Payer: Self-pay | Admitting: Hematology & Oncology

## 2015-03-25 ENCOUNTER — Other Ambulatory Visit: Payer: Self-pay | Admitting: Cardiology

## 2015-03-27 ENCOUNTER — Ambulatory Visit: Payer: Medicare Other | Admitting: Hematology & Oncology

## 2015-03-27 ENCOUNTER — Other Ambulatory Visit: Payer: Medicare Other

## 2015-04-10 DIAGNOSIS — E782 Mixed hyperlipidemia: Secondary | ICD-10-CM | POA: Diagnosis not present

## 2015-04-10 DIAGNOSIS — N400 Enlarged prostate without lower urinary tract symptoms: Secondary | ICD-10-CM | POA: Diagnosis not present

## 2015-04-10 DIAGNOSIS — R0609 Other forms of dyspnea: Secondary | ICD-10-CM | POA: Diagnosis not present

## 2015-04-10 DIAGNOSIS — D696 Thrombocytopenia, unspecified: Secondary | ICD-10-CM | POA: Diagnosis not present

## 2015-04-10 DIAGNOSIS — Z85118 Personal history of other malignant neoplasm of bronchus and lung: Secondary | ICD-10-CM | POA: Diagnosis not present

## 2015-04-10 DIAGNOSIS — Z79899 Other long term (current) drug therapy: Secondary | ICD-10-CM | POA: Diagnosis not present

## 2015-04-10 DIAGNOSIS — I2510 Atherosclerotic heart disease of native coronary artery without angina pectoris: Secondary | ICD-10-CM | POA: Diagnosis not present

## 2015-04-10 DIAGNOSIS — E559 Vitamin D deficiency, unspecified: Secondary | ICD-10-CM | POA: Diagnosis not present

## 2015-04-10 DIAGNOSIS — Z0001 Encounter for general adult medical examination with abnormal findings: Secondary | ICD-10-CM | POA: Diagnosis not present

## 2015-04-10 DIAGNOSIS — I319 Disease of pericardium, unspecified: Secondary | ICD-10-CM | POA: Diagnosis not present

## 2015-04-10 DIAGNOSIS — I10 Essential (primary) hypertension: Secondary | ICD-10-CM | POA: Diagnosis not present

## 2015-04-10 DIAGNOSIS — Z1389 Encounter for screening for other disorder: Secondary | ICD-10-CM | POA: Diagnosis not present

## 2015-04-11 DIAGNOSIS — H10011 Acute follicular conjunctivitis, right eye: Secondary | ICD-10-CM | POA: Diagnosis not present

## 2015-04-12 ENCOUNTER — Telehealth (HOSPITAL_COMMUNITY): Payer: Self-pay | Admitting: *Deleted

## 2015-04-12 NOTE — Telephone Encounter (Signed)
Patient given detailed instructions per Myocardial Perfusion Study Information Sheet for the test on 04/18/15 at 10:00. Patient notified to arrive 15 minutes early and that it is imperative to arrive on time for appointment to keep from having the test rescheduled.  If you need to cancel or reschedule your appointment, please call the office within 24 hours of your appointment. Failure to do so may result in a cancellation of your appointment, and a $50 no show fee. Patient verbalized understanding.[name] Fields

## 2015-04-18 ENCOUNTER — Other Ambulatory Visit (HOSPITAL_COMMUNITY): Payer: Self-pay | Admitting: Internal Medicine

## 2015-04-18 ENCOUNTER — Ambulatory Visit (HOSPITAL_COMMUNITY): Payer: Medicare Other | Attending: Cardiovascular Disease

## 2015-04-18 VITALS — Ht 69.0 in | Wt 191.0 lb

## 2015-04-18 DIAGNOSIS — R079 Chest pain, unspecified: Secondary | ICD-10-CM | POA: Insufficient documentation

## 2015-04-18 DIAGNOSIS — R5383 Other fatigue: Secondary | ICD-10-CM | POA: Insufficient documentation

## 2015-04-18 DIAGNOSIS — R0609 Other forms of dyspnea: Secondary | ICD-10-CM | POA: Insufficient documentation

## 2015-04-18 DIAGNOSIS — R0602 Shortness of breath: Secondary | ICD-10-CM

## 2015-04-18 DIAGNOSIS — I10 Essential (primary) hypertension: Secondary | ICD-10-CM | POA: Insufficient documentation

## 2015-04-18 LAB — MYOCARDIAL PERFUSION IMAGING
CHL CUP NUCLEAR SDS: 2
CHL CUP NUCLEAR SRS: 12
CHL CUP RESTING HR STRESS: 62 {beats}/min
LV dias vol: 101 mL
LV sys vol: 45 mL
NUC STRESS TID: 0.95
Peak HR: 74 {beats}/min
RATE: 0.36
SSS: 14

## 2015-04-18 MED ORDER — REGADENOSON 0.4 MG/5ML IV SOLN
0.4000 mg | Freq: Once | INTRAVENOUS | Status: AC
  Administered 2015-04-18: 0.4 mg via INTRAVENOUS

## 2015-04-18 MED ORDER — TECHNETIUM TC 99M SESTAMIBI GENERIC - CARDIOLITE
10.5000 | Freq: Once | INTRAVENOUS | Status: AC | PRN
  Administered 2015-04-18: 11 via INTRAVENOUS

## 2015-04-18 MED ORDER — AMINOPHYLLINE 25 MG/ML IV SOLN
75.0000 mg | Freq: Once | INTRAVENOUS | Status: AC
  Administered 2015-04-18: 75 mg via INTRAVENOUS

## 2015-04-18 MED ORDER — TECHNETIUM TC 99M SESTAMIBI GENERIC - CARDIOLITE
32.8000 | Freq: Once | INTRAVENOUS | Status: AC | PRN
  Administered 2015-04-18: 32.8 via INTRAVENOUS

## 2015-04-30 ENCOUNTER — Ambulatory Visit (HOSPITAL_BASED_OUTPATIENT_CLINIC_OR_DEPARTMENT_OTHER): Payer: Medicare Other

## 2015-04-30 VITALS — BP 118/61 | HR 58 | Temp 97.6°F | Resp 16

## 2015-04-30 DIAGNOSIS — Z452 Encounter for adjustment and management of vascular access device: Secondary | ICD-10-CM

## 2015-04-30 DIAGNOSIS — C3490 Malignant neoplasm of unspecified part of unspecified bronchus or lung: Secondary | ICD-10-CM | POA: Diagnosis not present

## 2015-04-30 MED ORDER — SODIUM CHLORIDE 0.9 % IJ SOLN
10.0000 mL | INTRAMUSCULAR | Status: DC | PRN
  Administered 2015-04-30: 10 mL via INTRAVENOUS
  Filled 2015-04-30: qty 10

## 2015-04-30 MED ORDER — HEPARIN SOD (PORK) LOCK FLUSH 100 UNIT/ML IV SOLN
500.0000 [IU] | Freq: Once | INTRAVENOUS | Status: AC
  Administered 2015-04-30: 500 [IU] via INTRAVENOUS
  Filled 2015-04-30: qty 5

## 2015-04-30 NOTE — Patient Instructions (Signed)

## 2015-04-30 NOTE — Progress Notes (Signed)
[name] Fields presented for Portacath access and flush. Proper placement of portacath confirmed by CXR. Portacath located in the right chest wall accessed with  H 20 needle. Clean, Dry and Intact Good blood return present. Portacath flushed with 39m NS and 500U/592mHeparin per protocol and needle removed intact. Procedure without incident. Patient tolerated procedure well.

## 2015-05-26 IMAGING — CT CT CHEST W/ CM
1 of 2 series · 14 of 31 positions shown, 18 images · IV contrast (omnipaque)
Comparison: 11/13/2011

CLINICAL DATA: Lung cancer.  Cough and shortness of breath.

CT CHEST WITH CONTRAST
TECHNIQUE: Multidetector CT imaging of the chest was performed
following the standard protocol during bolus administration of
intravenous contrast.
Contrast: 80mL OMNIPAQUE IOHEXOL 300 MG/ML  SOLN

[Series 2: rtn chest with st · axial · 0.75mm/px · z∈[+74,+318]mm · 14 of 59 slices shown, 18 images]
[im 5/59  mediastinal]
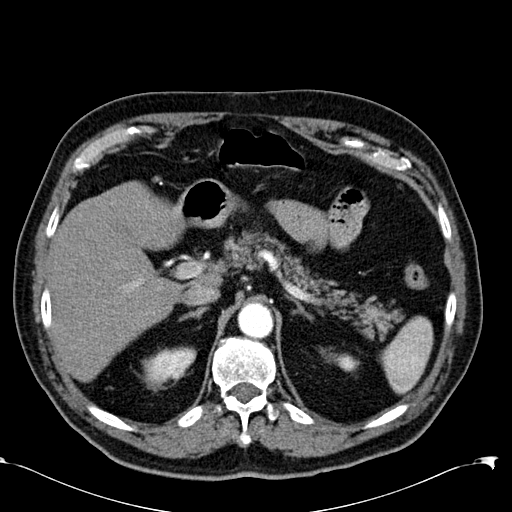
[im 5/59  lung]
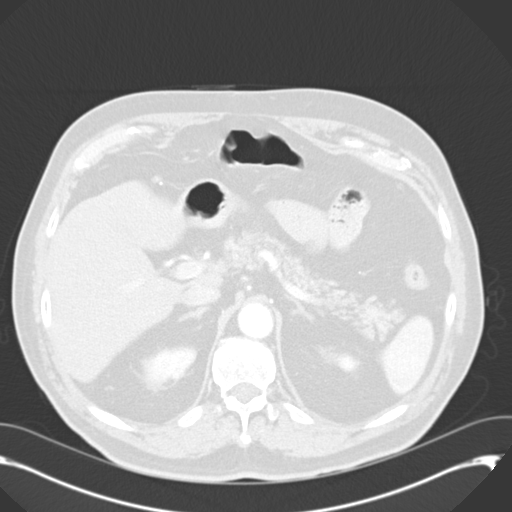
[im 9/59  lung]
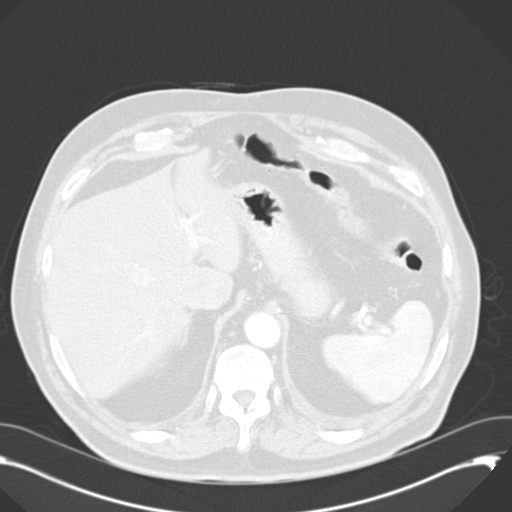
[im 14/59  lung]
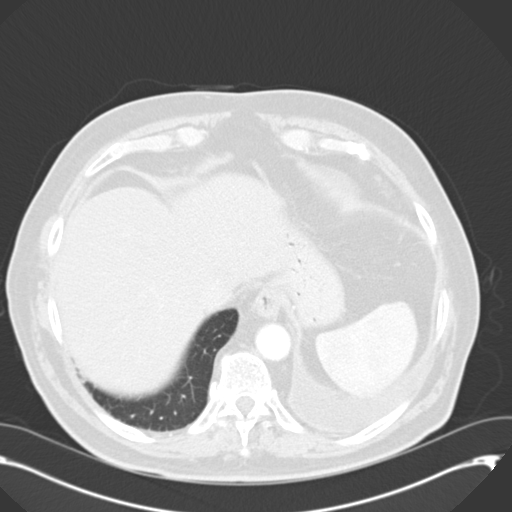
[im 18/59  lung]
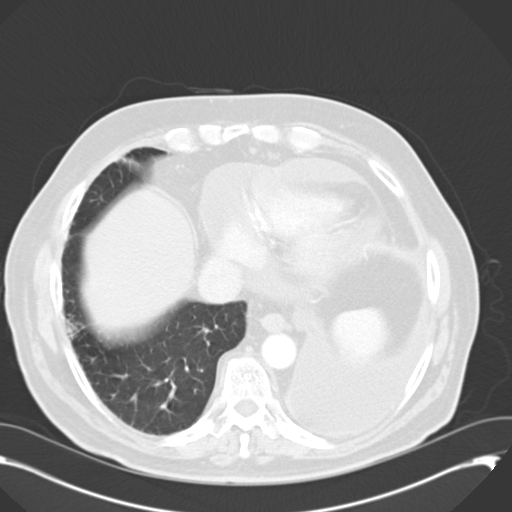
[im 23/59  mediastinal]
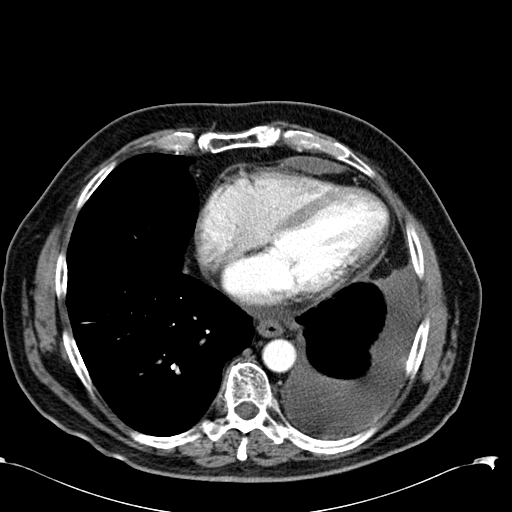
[im 23/59  lung]
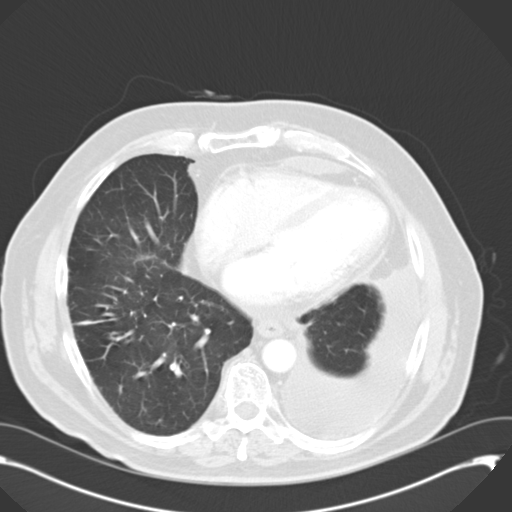
[im 27/59  lung]
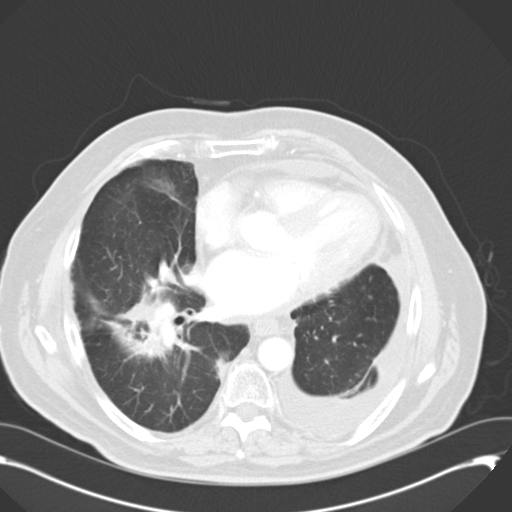
[im 28/59  lung]
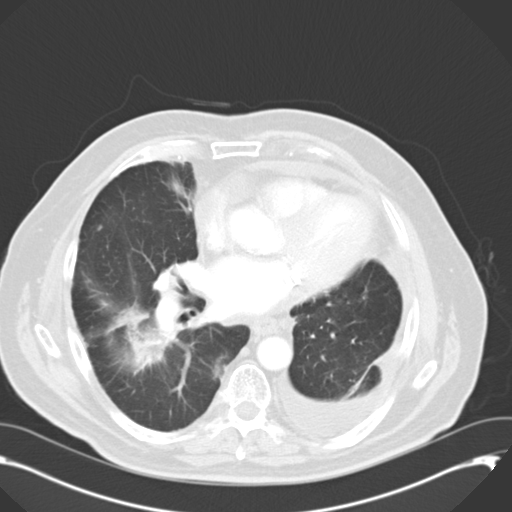
[im 30/59  lung]
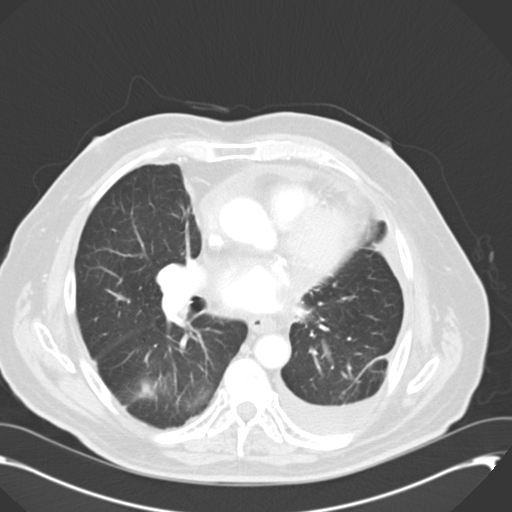
[im 32/59  mediastinal]
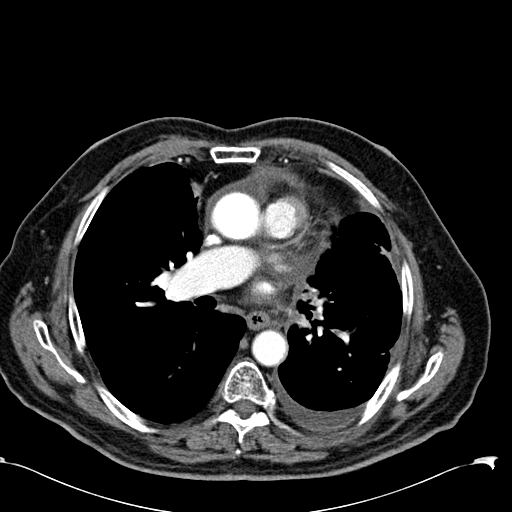
[im 32/59  lung]
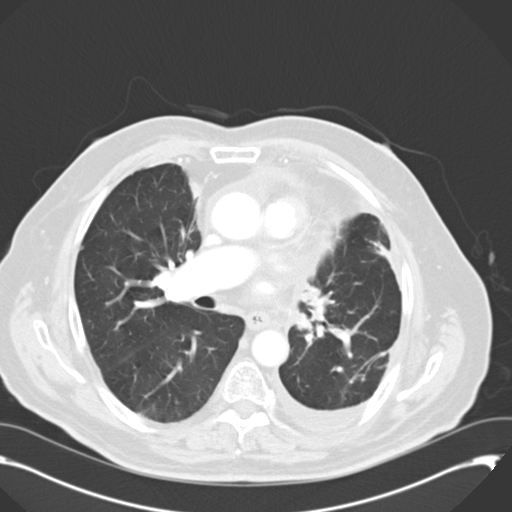
[im 36/59  lung]
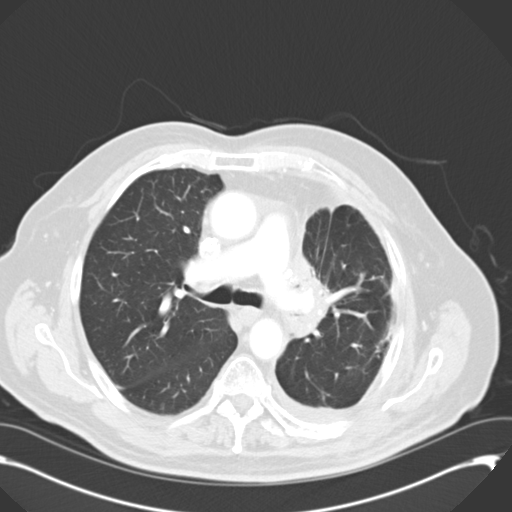
[im 41/59  lung]
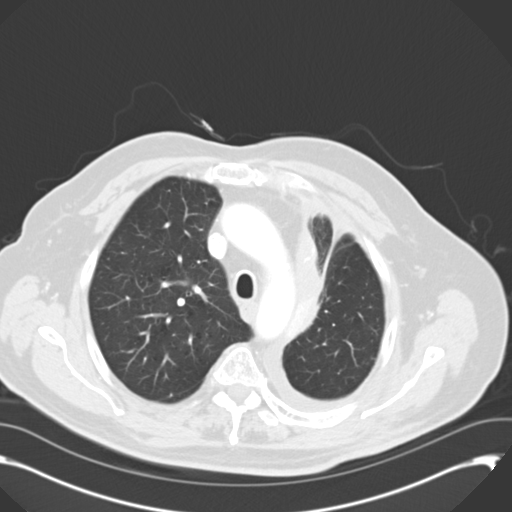
[im 45/59  lung]
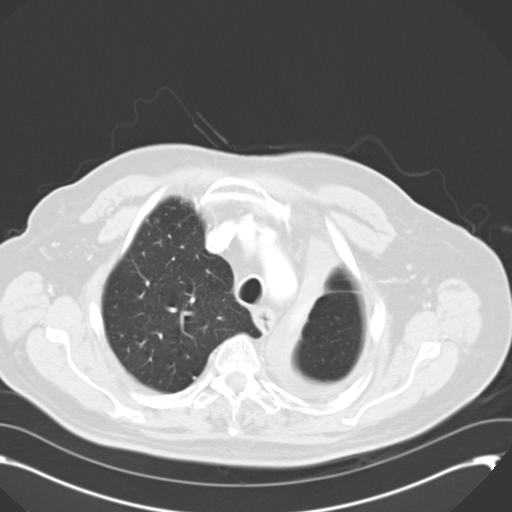
[im 50/59  mediastinal]
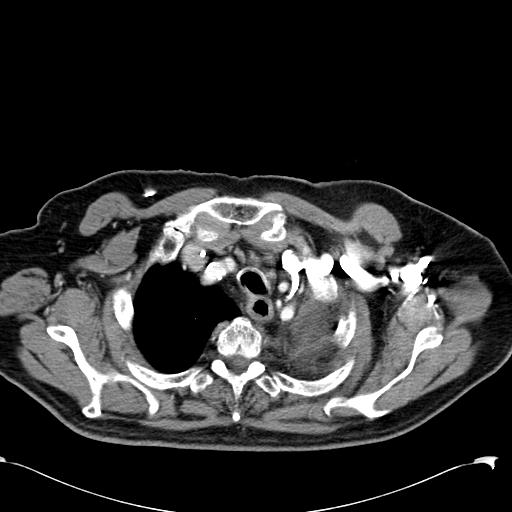
[im 50/59  lung]
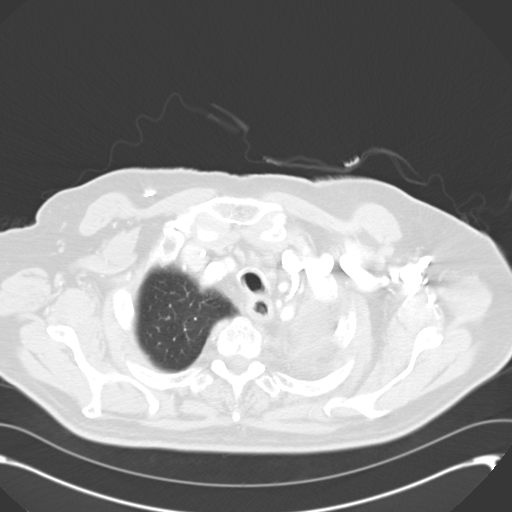
[im 54/59  lung]
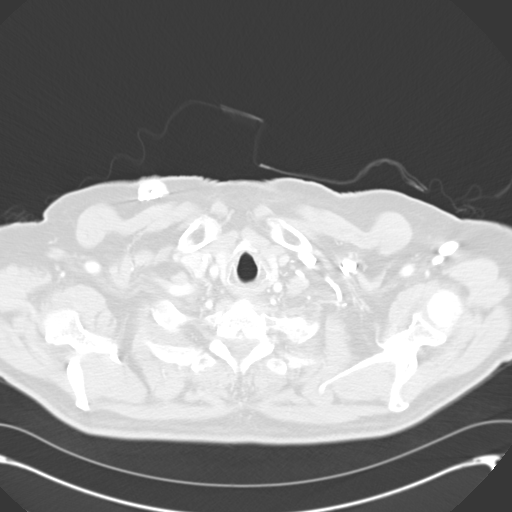

[14 of 31 positions shown; findings below may reference images not displayed]

FINDINGS: Right-sided Port-A-Cath tip is positioned at the mid SVC.

No axillary lymphadenopathy.  Loculated pleural fluid in the upper
left hemithorax is stable.  The soft tissue attenuation in and
around the left hilum is slightly progressed, measuring 3.4 x
cm today compared to 2.1 x 3.6 cm previously.  There may be a
component of increased collapse lung contributing to the more
prominent appearance today.  Loculated pleural fluid at the left
base has increased slightly in the interval.

Stable appearance of mild to moderate pericardial fluid.

Lung windows show progression of airspace opacity in the central
right lower lobe, tracking up into the right hilum.  This is the
area of new airspace disease seen on the previous study which is
not become more confluent.  Neoplasm in this location is not
excluded.  PET CT may prove helpful to further evaluate.

Bone windows reveal no worrisome lytic or sclerotic osseous
lesions.
IMPRESSION: Interval progression of soft tissue attenuation in and around the
left hilum may reflect evolving post treatment changes, but disease
progression is a concern.

Interval development of focal ill-defined opacity in the central
right lower lobe infrahilar region. The patient had known squamous
cell carcinoma in this region and underwent  stereotactic radiation
about a year ago. The previous CT scan was immediately after the
completion of the radiation treatment and the appearance on today's
CT scan may be related to interval development of fibrosis although
disease progression cannot be excluded given the lack of
intervening imaging.  Follow-up or PET CT may prove helpful to
further assess...

## 2015-06-04 ENCOUNTER — Ambulatory Visit (HOSPITAL_BASED_OUTPATIENT_CLINIC_OR_DEPARTMENT_OTHER): Payer: Medicare Other | Admitting: Family

## 2015-06-04 ENCOUNTER — Other Ambulatory Visit (HOSPITAL_BASED_OUTPATIENT_CLINIC_OR_DEPARTMENT_OTHER): Payer: Medicare Other

## 2015-06-04 ENCOUNTER — Encounter: Payer: Self-pay | Admitting: Family

## 2015-06-04 VITALS — BP 111/66 | HR 60 | Temp 97.9°F | Resp 16 | Ht 69.0 in | Wt 195.0 lb

## 2015-06-04 DIAGNOSIS — R5383 Other fatigue: Secondary | ICD-10-CM

## 2015-06-04 DIAGNOSIS — C3431 Malignant neoplasm of lower lobe, right bronchus or lung: Secondary | ICD-10-CM

## 2015-06-04 DIAGNOSIS — C3490 Malignant neoplasm of unspecified part of unspecified bronchus or lung: Secondary | ICD-10-CM | POA: Diagnosis present

## 2015-06-04 DIAGNOSIS — Z85118 Personal history of other malignant neoplasm of bronchus and lung: Secondary | ICD-10-CM | POA: Diagnosis not present

## 2015-06-04 DIAGNOSIS — D696 Thrombocytopenia, unspecified: Secondary | ICD-10-CM

## 2015-06-04 LAB — CBC WITH DIFFERENTIAL (CANCER CENTER ONLY)
BASO#: 0.1 10*3/uL (ref 0.0–0.2)
BASO%: 1.1 % (ref 0.0–2.0)
EOS%: 4.3 % (ref 0.0–7.0)
Eosinophils Absolute: 0.4 10*3/uL (ref 0.0–0.5)
HEMATOCRIT: 43.9 % (ref 38.7–49.9)
HEMOGLOBIN: 14.3 g/dL (ref 13.0–17.1)
LYMPH#: 1.1 10*3/uL (ref 0.9–3.3)
LYMPH%: 12.6 % — ABNORMAL LOW (ref 14.0–48.0)
MCH: 27.8 pg — ABNORMAL LOW (ref 28.0–33.4)
MCHC: 32.6 g/dL (ref 32.0–35.9)
MCV: 85 fL (ref 82–98)
MONO#: 0.7 10*3/uL (ref 0.1–0.9)
MONO%: 8.9 % (ref 0.0–13.0)
NEUT%: 73.1 % (ref 40.0–80.0)
NEUTROS ABS: 6.1 10*3/uL (ref 1.5–6.5)
Platelets: 121 10*3/uL — ABNORMAL LOW (ref 145–400)
RBC: 5.14 10*6/uL (ref 4.20–5.70)
RDW: 16 % — ABNORMAL HIGH (ref 11.1–15.7)
WBC: 8.3 10*3/uL (ref 4.0–10.0)

## 2015-06-04 LAB — COMPREHENSIVE METABOLIC PANEL
ALBUMIN: 4 g/dL (ref 3.5–5.0)
ALK PHOS: 139 U/L (ref 40–150)
ALT: 29 U/L (ref 0–55)
AST: 30 U/L (ref 5–34)
Anion Gap: 9 mEq/L (ref 3–11)
BILIRUBIN TOTAL: 0.9 mg/dL (ref 0.20–1.20)
BUN: 13.5 mg/dL (ref 7.0–26.0)
CO2: 27 mEq/L (ref 22–29)
Calcium: 9.7 mg/dL (ref 8.4–10.4)
Chloride: 105 mEq/L (ref 98–109)
Creatinine: 1 mg/dL (ref 0.7–1.3)
EGFR: 76 mL/min/{1.73_m2} — ABNORMAL LOW (ref >90)
GLUCOSE: 98 mg/dL (ref 70–140)
Potassium: 4.2 mEq/L (ref 3.5–5.1)
SODIUM: 141 meq/L (ref 136–145)
TOTAL PROTEIN: 8.1 g/dL (ref 6.4–8.3)

## 2015-06-04 LAB — LACTATE DEHYDROGENASE: LDH: 188 U/L (ref 125–245)
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
## 2015-06-25 ENCOUNTER — Other Ambulatory Visit (HOSPITAL_BASED_OUTPATIENT_CLINIC_OR_DEPARTMENT_OTHER): Payer: Medicare Other

## 2015-06-25 DIAGNOSIS — R0681 Apnea, not elsewhere classified: Secondary | ICD-10-CM | POA: Insufficient documentation

## 2015-06-25 DIAGNOSIS — E782 Mixed hyperlipidemia: Secondary | ICD-10-CM | POA: Diagnosis not present

## 2015-06-25 DIAGNOSIS — M791 Myalgia, unspecified site: Secondary | ICD-10-CM | POA: Insufficient documentation

## 2015-06-25 DIAGNOSIS — J449 Chronic obstructive pulmonary disease, unspecified: Secondary | ICD-10-CM | POA: Diagnosis not present

## 2015-06-25 DIAGNOSIS — I10 Essential (primary) hypertension: Secondary | ICD-10-CM | POA: Diagnosis not present

## 2015-06-25 DIAGNOSIS — F329 Major depressive disorder, single episode, unspecified: Secondary | ICD-10-CM | POA: Diagnosis not present

## 2015-06-25 DIAGNOSIS — I2510 Atherosclerotic heart disease of native coronary artery without angina pectoris: Secondary | ICD-10-CM | POA: Diagnosis not present

## 2015-06-25 DIAGNOSIS — Z1389 Encounter for screening for other disorder: Secondary | ICD-10-CM | POA: Diagnosis not present

## 2015-06-25 DIAGNOSIS — R0609 Other forms of dyspnea: Secondary | ICD-10-CM | POA: Diagnosis not present

## 2015-06-25 DIAGNOSIS — J441 Chronic obstructive pulmonary disease with (acute) exacerbation: Secondary | ICD-10-CM | POA: Diagnosis not present

## 2015-07-02 ENCOUNTER — Telehealth: Payer: Self-pay | Admitting: Family

## 2015-07-02 ENCOUNTER — Ambulatory Visit (HOSPITAL_BASED_OUTPATIENT_CLINIC_OR_DEPARTMENT_OTHER)
Admission: RE | Admit: 2015-07-02 | Discharge: 2015-07-02 | Disposition: A | Discharge status: Home / Self Care | Payer: Medicare Other | Source: Ambulatory Visit | Attending: Family | Admitting: Family

## 2015-07-02 DIAGNOSIS — C3490 Malignant neoplasm of unspecified part of unspecified bronchus or lung: Secondary | ICD-10-CM | POA: Diagnosis not present

## 2015-07-02 NOTE — Telephone Encounter (Signed)
Is spoke with [name] Fields over the phone and give him his Chest xray results from this morning. He is currently getting over bronchitis and states that he finished his antibiotic today. He will follow-up with his PCP if he does not continue to improve.  Xray showed no significant change in the lungs. He has an appointment with Dr. Marin Olp in August and we will plan to see him at that time. All questions were answered and he will contact us with any further questions or concerns.

## 2015-07-04 DIAGNOSIS — I2510 Atherosclerotic heart disease of native coronary artery without angina pectoris: Secondary | ICD-10-CM | POA: Diagnosis not present

## 2015-07-04 DIAGNOSIS — F329 Major depressive disorder, single episode, unspecified: Secondary | ICD-10-CM | POA: Diagnosis not present

## 2015-07-04 DIAGNOSIS — E782 Mixed hyperlipidemia: Secondary | ICD-10-CM | POA: Diagnosis not present

## 2015-07-04 DIAGNOSIS — J449 Chronic obstructive pulmonary disease, unspecified: Secondary | ICD-10-CM | POA: Diagnosis not present

## 2015-07-04 DIAGNOSIS — M791 Myalgia: Secondary | ICD-10-CM | POA: Diagnosis not present

## 2015-07-04 DIAGNOSIS — I10 Essential (primary) hypertension: Secondary | ICD-10-CM | POA: Diagnosis not present

## 2015-07-04 DIAGNOSIS — R0609 Other forms of dyspnea: Secondary | ICD-10-CM | POA: Diagnosis not present

## 2015-07-04 DIAGNOSIS — J441 Chronic obstructive pulmonary disease with (acute) exacerbation: Secondary | ICD-10-CM | POA: Diagnosis not present

## 2015-07-17 ENCOUNTER — Ambulatory Visit (HOSPITAL_BASED_OUTPATIENT_CLINIC_OR_DEPARTMENT_OTHER): Payer: Medicare Other

## 2015-07-17 VITALS — BP 112/63 | HR 60 | Temp 97.5°F | Resp 16

## 2015-07-17 DIAGNOSIS — F329 Major depressive disorder, single episode, unspecified: Secondary | ICD-10-CM | POA: Insufficient documentation

## 2015-07-17 DIAGNOSIS — K5900 Constipation, unspecified: Secondary | ICD-10-CM | POA: Insufficient documentation

## 2015-07-17 DIAGNOSIS — M7580 Other shoulder lesions, unspecified shoulder: Secondary | ICD-10-CM | POA: Insufficient documentation

## 2015-07-17 DIAGNOSIS — R519 Headache, unspecified: Secondary | ICD-10-CM | POA: Insufficient documentation

## 2015-07-17 DIAGNOSIS — Z85118 Personal history of other malignant neoplasm of bronchus and lung: Secondary | ICD-10-CM | POA: Insufficient documentation

## 2015-07-17 DIAGNOSIS — G9332 Myalgic encephalomyelitis/chronic fatigue syndrome: Secondary | ICD-10-CM | POA: Insufficient documentation

## 2015-07-17 DIAGNOSIS — D8989 Other specified disorders involving the immune mechanism, not elsewhere classified: Secondary | ICD-10-CM | POA: Insufficient documentation

## 2015-07-17 DIAGNOSIS — E7800 Pure hypercholesterolemia, unspecified: Secondary | ICD-10-CM | POA: Insufficient documentation

## 2015-07-17 DIAGNOSIS — J309 Allergic rhinitis, unspecified: Secondary | ICD-10-CM | POA: Insufficient documentation

## 2015-07-17 DIAGNOSIS — R51 Headache: Secondary | ICD-10-CM

## 2015-07-17 DIAGNOSIS — I10 Essential (primary) hypertension: Secondary | ICD-10-CM | POA: Insufficient documentation

## 2015-07-17 DIAGNOSIS — C3490 Malignant neoplasm of unspecified part of unspecified bronchus or lung: Secondary | ICD-10-CM

## 2015-07-17 DIAGNOSIS — R5382 Chronic fatigue, unspecified: Secondary | ICD-10-CM

## 2015-07-17 DIAGNOSIS — C3430 Malignant neoplasm of lower lobe, unspecified bronchus or lung: Secondary | ICD-10-CM

## 2015-07-17 DIAGNOSIS — N400 Enlarged prostate without lower urinary tract symptoms: Secondary | ICD-10-CM | POA: Insufficient documentation

## 2015-07-17 DIAGNOSIS — L570 Actinic keratosis: Secondary | ICD-10-CM | POA: Insufficient documentation

## 2015-07-17 DIAGNOSIS — M25519 Pain in unspecified shoulder: Secondary | ICD-10-CM | POA: Insufficient documentation

## 2015-07-17 DIAGNOSIS — K219 Gastro-esophageal reflux disease without esophagitis: Secondary | ICD-10-CM | POA: Insufficient documentation

## 2015-07-17 DIAGNOSIS — K3189 Other diseases of stomach and duodenum: Secondary | ICD-10-CM | POA: Insufficient documentation

## 2015-07-17 DIAGNOSIS — M1710 Unilateral primary osteoarthritis, unspecified knee: Secondary | ICD-10-CM | POA: Insufficient documentation

## 2015-07-17 DIAGNOSIS — N529 Male erectile dysfunction, unspecified: Secondary | ICD-10-CM | POA: Insufficient documentation

## 2015-07-17 DIAGNOSIS — M25569 Pain in unspecified knee: Secondary | ICD-10-CM | POA: Insufficient documentation

## 2015-07-17 DIAGNOSIS — K649 Unspecified hemorrhoids: Secondary | ICD-10-CM | POA: Insufficient documentation

## 2015-07-17 DIAGNOSIS — J441 Chronic obstructive pulmonary disease with (acute) exacerbation: Secondary | ICD-10-CM | POA: Insufficient documentation

## 2015-07-17 DIAGNOSIS — Z0000 Encounter for general adult medical examination without abnormal findings: Secondary | ICD-10-CM | POA: Insufficient documentation

## 2015-07-17 DIAGNOSIS — R05 Cough: Secondary | ICD-10-CM | POA: Insufficient documentation

## 2015-07-17 DIAGNOSIS — I444 Left anterior fascicular block: Secondary | ICD-10-CM | POA: Insufficient documentation

## 2015-07-17 DIAGNOSIS — R6882 Decreased libido: Secondary | ICD-10-CM | POA: Insufficient documentation

## 2015-07-17 DIAGNOSIS — L989 Disorder of the skin and subcutaneous tissue, unspecified: Secondary | ICD-10-CM | POA: Insufficient documentation

## 2015-07-17 DIAGNOSIS — Z452 Encounter for adjustment and management of vascular access device: Secondary | ICD-10-CM

## 2015-07-17 DIAGNOSIS — E782 Mixed hyperlipidemia: Secondary | ICD-10-CM | POA: Insufficient documentation

## 2015-07-17 DIAGNOSIS — D039 Melanoma in situ, unspecified: Secondary | ICD-10-CM | POA: Insufficient documentation

## 2015-07-17 DIAGNOSIS — M179 Osteoarthritis of knee, unspecified: Secondary | ICD-10-CM | POA: Insufficient documentation

## 2015-07-17 DIAGNOSIS — E559 Vitamin D deficiency, unspecified: Secondary | ICD-10-CM | POA: Insufficient documentation

## 2015-07-17 DIAGNOSIS — J449 Chronic obstructive pulmonary disease, unspecified: Secondary | ICD-10-CM | POA: Insufficient documentation

## 2015-07-17 DIAGNOSIS — B354 Tinea corporis: Secondary | ICD-10-CM | POA: Insufficient documentation

## 2015-07-17 DIAGNOSIS — C44519 Basal cell carcinoma of skin of other part of trunk: Secondary | ICD-10-CM

## 2015-07-17 DIAGNOSIS — Z79899 Other long term (current) drug therapy: Secondary | ICD-10-CM | POA: Insufficient documentation

## 2015-07-17 DIAGNOSIS — R059 Cough, unspecified: Secondary | ICD-10-CM | POA: Insufficient documentation

## 2015-07-17 DIAGNOSIS — I2510 Atherosclerotic heart disease of native coronary artery without angina pectoris: Secondary | ICD-10-CM | POA: Insufficient documentation

## 2015-07-17 MED ORDER — SODIUM CHLORIDE 0.9% FLUSH
10.0000 mL | INTRAVENOUS | Status: DC | PRN
  Administered 2015-07-17: 10 mL via INTRAVENOUS
  Filled 2015-07-17: qty 10

## 2015-07-17 MED ORDER — HEPARIN SOD (PORK) LOCK FLUSH 100 UNIT/ML IV SOLN
500.0000 [IU] | Freq: Once | INTRAVENOUS | Status: AC
  Administered 2015-07-17: 500 [IU] via INTRAVENOUS
  Filled 2015-07-17: qty 5

## 2015-07-17 NOTE — Patient Instructions (Signed)

## 2015-07-25 ENCOUNTER — Encounter: Payer: Self-pay | Admitting: Cardiology

## 2015-07-25 ENCOUNTER — Ambulatory Visit (INDEPENDENT_AMBULATORY_CARE_PROVIDER_SITE_OTHER): Payer: Medicare Other | Admitting: Cardiology

## 2015-07-25 VITALS — BP 130/80 | HR 64 | Ht 70.0 in | Wt 186.8 lb

## 2015-07-25 DIAGNOSIS — I2583 Coronary atherosclerosis due to lipid rich plaque: Secondary | ICD-10-CM

## 2015-07-25 DIAGNOSIS — I2510 Atherosclerotic heart disease of native coronary artery without angina pectoris: Secondary | ICD-10-CM | POA: Diagnosis not present

## 2015-07-25 DIAGNOSIS — I252 Old myocardial infarction: Secondary | ICD-10-CM

## 2015-07-25 DIAGNOSIS — J438 Other emphysema: Secondary | ICD-10-CM

## 2015-07-25 DIAGNOSIS — R0609 Other forms of dyspnea: Secondary | ICD-10-CM

## 2015-07-25 DIAGNOSIS — E785 Hyperlipidemia, unspecified: Secondary | ICD-10-CM | POA: Diagnosis not present

## 2015-07-25 NOTE — Progress Notes (Signed)
[facility] [address], [address] [place], [place]  [address] Phone: [phone] Fax:  [phone]  Date:  07/25/2015   ID:  [name] Fields, DOB 20-Aug-1934, MRN [id number]  PCP:  [name] Screws, MD   History of Present Illness: [name] Fields is a 81 y.o. male with coronary artery disease status post multiple stents in the past, former patient of Dr. Doreatha Fields here for followup. Recurrent pleural effusion.  Done with radiation tx for lung cancer. CT scan 01/03/13. 6 month CT interval.  In April of 2001, lateral wall MI, circumflex stent with brachii therapy and angioplasty of stent in 2001. In 2011 underwent a pericardial window because of pericardial effusion.   COPD, Dr. Lamonte Sakai. Has to stop during walk. He shortness of breath has been quite chronic.  He was recently hospitalized 12/22/14-had some centralized chest discomfort, colchicine was started him. He for possibility of pericarditis. He had a trivial pericardial effusion. No longer on colchicine. No longer having chest discomfort.  LDL 63 on 03/23/12, creatinine 0.92.  Vertigo, fell, hit head. 16 weeks of therapy.   07/25/15 - had Flu. Prednisone. Cough. Still present. Dr. Inda Fields saw him, performed an EKG which did show some T-wave inversion in V2 which was different from prior EKG. Personally viewed. He did undergo a nuclear stress test on 04/18/15 which overall was low risk with no signs of ischemia, normal ejection fraction. His last echocardiogram in September 2016 showed normal ejection fraction. Still has quite significant dyspnea on exertion and today in the office has severe wheezing, cough    Wt Readings from Last 3 Encounters:  07/25/15 186 lb 12.8 oz (84.732 kg)  06/04/15 195 lb (88.451 kg)  04/18/15 191 lb (86.637 kg)     Past Medical History  Diagnosis Date  . IHD (ischemic heart disease)     Remote MI in 2001 with stent to the LCX  . Hypertension   . Hyperlipidemia   . SOB (shortness of breath)    Chronic with known fibrosis  . Hypokinesia     MILD INFERIOR  . GERD (gastroesophageal reflux disease)   . Lactose intolerance   . Diverticulosis   . Hearing loss   . Pleural effusion, left   . Pericardial effusion July 2011    s/p pericardial window; last echo in August 2012 with minimal effusion  . Normal nuclear stress test 2009    EF 63%. No ischemia. 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  . Arthritis     knees  . Anxiety     takes wellbutrin daily  . Hx of radiation therapy 09/29/11;10/01/11;10/03/11;10/06/11;10/08/11;    RLLlung,50Gy/25f - SBRT  . Status post chemotherapy 06/04/2009 -  09/06/2009     4 cycles of Cisplatin and Gemcitabine with Neulasta Support   . Non-small cell carcinoma of lung ([facility]     Dx in October of 2010  . Vertigo     hx. of- ambulates with cane for safety.    Past Surgical History  Procedure Laterality Date  . Pericardial window  July 2011  . Cardiovascular stress test  05/2007    EF 63%  . Coronary stent placement  07/1999    STENT TO THE LEFT CIRCUMFLEX  . Cardiac catheterization  07/11/2003    EF 60%, REPEAT, WHICH SHOWED 30% PROXIMAL NARROWING IN THE RIGHT  CORONARY ARTERY  . Transthoracic echocardiogram  02/22/2010    EF 55-60%  . Hernia repair      as an adult- R  inguinal   . Cataract extraction, bilateral Bilateral   . Colonoscopy with propofol N/A 01/30/2015    Procedure: COLONOSCOPY WITH PROPOFOL;  Surgeon: [name] Fair, MD;  Location: [facility];  Service: Endoscopy;  Laterality: N/A;    Current Outpatient Prescriptions  Medication Sig Dispense Refill  . aspirin 81 MG chewable tablet Chew 1 tablet (81 mg total) by mouth daily. 30 tablet 1  . atorvastatin (LIPITOR) 20 MG tablet TAKE 1 TABLET BY MOUTH DAILY 90 tablet 1  . buPROPion (WELLBUTRIN SR) 150 MG 12 hr tablet Take 150 mg by mouth daily.     . colchicine 0.6 MG tablet Take 1 tablet (0.6 mg total) by mouth 2 (two) times daily. 60 tablet 2  . cyanocobalamin 500 MCG tablet Take 1 tablet (500 mcg  total) by mouth daily. 30 tablet 0  . Linaclotide (LINZESS) 145 MCG CAPS capsule Take 145 mcg by mouth daily as needed (constipation).    . meclizine (ANTIVERT) 12.5 MG tablet Take 1 tablet (12.5 mg total) by mouth 3 (three) times daily as needed for dizziness. 60 tablet 0  . metoprolol tartrate (LOPRESSOR) 25 MG tablet TAKE 1/2 TABLET BY MOUTH TWICE DAILY 90 tablet 2  . pantoprazole (PROTONIX) 40 MG tablet Take 40 mg by mouth daily.    . Tamsulosin HCl (FLOMAX) 0.4 MG CAPS Take 0.4 mg by mouth daily with supper.     . temazepam (RESTORIL) 30 MG capsule Take 30 mg by mouth at bedtime as needed for sleep.    Marland Kitchen venlafaxine XR (EFFEXOR-XR) 150 MG 24 hr capsule Take 1 capsule by mouth daily.  1   No current facility-administered medications for this visit.    Allergies:    Allergies  Allergen Reactions  . Sertraline Hcl Other (See Comments)    Patient does not remember the reaction    Social History:  The patient  reports that he quit smoking about 21 years ago. His smoking use included Cigarettes. He has a 120 pack-year smoking history. He has never used smokeless tobacco. He reports that he does not drink alcohol or use illicit drugs.   ROS:  Please see the history of present illness.   Denies any syncope, bleeding, orthopnea, PND    PHYSICAL EXAM: VS:  BP 130/80 mmHg  Pulse 64  Ht '5\' 10"'$  (1.778 m)  Wt 186 lb 12.8 oz (84.732 kg)  BMI 26.80 kg/m2 Well nourished, well developed, in no acute distress HEENT: Multiple excoriations on nose Neck: no JVD Cardiac:  normal S1, S2; RRR; no murmur Lungs:   + wheezing,no  rhonchi or rales Abd: soft, nontender, no hepatomegaly Ext: no edema Skin: warm and dry Neuro: no focal abnormalities noted  Nuclear stress test 07/19/12-basal to mid lateral wall fixed defect consistent with infarct. No ischemia, EF 58%.  EKG: 06/25/15-sinus rhythm, 78, right bundle branch incomplete appearance, T-wave inversion noted in lead V2, change from prior EKG,  inferior Q waves. Poor R-wave progression noted. Personally viewed.  02/24/14-demonstrates sinus rhythm, right bundle branch block, first degree AV block, PR interval 275m. Inferior infarct patternNo significant change from prior  ASSESSMENT AND PLAN:  History of possible pericarditis  - seen in the hospital by Dr. ROval Linsey  Colchicine now off.   Thrombocytopenia -reviewed Dr. ATommy Rainwaternote. He states that "I don't see any problems with him being on aspirin. I probably would not put him on Plavix.   History of Lung cancer -currently maxed out on radiation therapy. Recent CT scan. Been monitored  closely by oncology/radiation oncology. Used to see Dr. Weber Cooks. Now sees Enniver.  Coronary artery disease -no active anginal symptoms. Exercising well.   Prior PCI's -no recent anginal symptoms. -Recent stress test 04/18/2015 was reassuring, low risk with no evidence of ischemia. At this time, I do not think that we should proceed with any further invasive testing. Continue with current medical therapy.  Hyperlipidemia -continue with statin therapy.  Former tobacco use  COPD/dyspnea -Dr. Pervis Hocking. I do appreciate active wheezing. Continue with inhalers -Recent EKG performed by Dr. Inda Fields does show T-wave inversion isolated in lead V2 which is a change from prior EKG however, since he has had recent nuclear stress test that was reassuring, and his lungs clearly are demonstrating reactivity, I think that his current lung condition is driving his shortness of breath. I do not believe that further invasive evaluation is necessary at this point.  Old myocardial infarction -lateral wall infarct  Old pericardial effusion -prior window.  I told him to contact me if any further cardiovascular needs are needed in the meantime, I will see him back in 6 months. Continue to walk, exercise. This will hopefully help him with his energy levels.  Signed, Candee Furbish, MD [facility]  07/25/2015 9:38 AM

## 2015-07-25 NOTE — Patient Instructions (Signed)

## 2015-07-30 DIAGNOSIS — F329 Major depressive disorder, single episode, unspecified: Secondary | ICD-10-CM | POA: Diagnosis not present

## 2015-07-30 DIAGNOSIS — I10 Essential (primary) hypertension: Secondary | ICD-10-CM | POA: Diagnosis not present

## 2015-07-30 DIAGNOSIS — J441 Chronic obstructive pulmonary disease with (acute) exacerbation: Secondary | ICD-10-CM | POA: Diagnosis not present

## 2015-07-30 DIAGNOSIS — R0609 Other forms of dyspnea: Secondary | ICD-10-CM | POA: Diagnosis not present

## 2015-07-30 DIAGNOSIS — I2510 Atherosclerotic heart disease of native coronary artery without angina pectoris: Secondary | ICD-10-CM | POA: Diagnosis not present

## 2015-07-30 DIAGNOSIS — J449 Chronic obstructive pulmonary disease, unspecified: Secondary | ICD-10-CM | POA: Diagnosis not present

## 2015-07-30 DIAGNOSIS — E782 Mixed hyperlipidemia: Secondary | ICD-10-CM | POA: Diagnosis not present

## 2015-07-30 DIAGNOSIS — M791 Myalgia: Secondary | ICD-10-CM | POA: Diagnosis not present

## 2015-09-10 ENCOUNTER — Ambulatory Visit (HOSPITAL_BASED_OUTPATIENT_CLINIC_OR_DEPARTMENT_OTHER): Payer: Medicare Other

## 2015-09-10 VITALS — BP 128/67 | HR 98 | Temp 98.1°F | Resp 18

## 2015-09-10 DIAGNOSIS — C3490 Malignant neoplasm of unspecified part of unspecified bronchus or lung: Secondary | ICD-10-CM | POA: Diagnosis not present

## 2015-09-10 DIAGNOSIS — Z452 Encounter for adjustment and management of vascular access device: Secondary | ICD-10-CM

## 2015-09-10 DIAGNOSIS — C44519 Basal cell carcinoma of skin of other part of trunk: Secondary | ICD-10-CM

## 2015-09-10 MED ORDER — HEPARIN SOD (PORK) LOCK FLUSH 100 UNIT/ML IV SOLN
500.0000 [IU] | Freq: Once | INTRAVENOUS | Status: AC
  Administered 2015-09-10: 500 [IU] via INTRAVENOUS
  Filled 2015-09-10: qty 5

## 2015-09-10 MED ORDER — SODIUM CHLORIDE 0.9% FLUSH
10.0000 mL | INTRAVENOUS | Status: DC | PRN
  Administered 2015-09-10: 10 mL via INTRAVENOUS
  Filled 2015-09-10: qty 10

## 2015-09-10 NOTE — Patient Instructions (Signed)
Central Lines A central line is a soft, flexible tube (catheter) that is used to give medicine or nutrition through a person's veins. The tip of the central line ends in a large vein (superior vena cava) just above the person's heart. Medicine given through the central line is quickly mixed with blood because the blood flow within this large vein is so great. This dilutes the medicine so it is swiftly delivered throughout the body. Insertion of any type of central line is a sterile procedure.  A central line may be placed because:   You need medicine that would be irritating to the small veins in your hands or arms.   You need long-term IV medicines, such as antibiotics.   You need nutrition delivered through a central line.   You have veins in your hands or arms that are difficult to access.   You need frequent blood draws for lab tests.   You need dialysis. TYPES OF CENTRAL LINES There are different types of central lines. The type of central line you receive depends on how long it will be needed, your medical condition, and the condition of your veins. You might receive any of these types:   Peripherally inserted central catheter (PICC) lines. These are usually inserted in the upper arm. They are used for intermediate to long-term access. These lines are often inserted by a specially trained nurse using ultrasonography to guide the placement.  Non-tunneled central lines. These are inserted in the neck, chest, or groin. They are used for short-term access, usually a maximum of 7 days. These lines are often used in the emergency department, and they can have a high infection rate.  Tunneled central lines. These are normally inserted in the upper chest area. They are used for long-term therapy and may be used for dialysis. These lines are inserted and removed surgically.  Implanted ports. These are normally inserted in the upper chest but can also be placed in the upper arm or in the  stomach area (abdomen). They are used for long-term therapy. Ports are inserted and removed surgically. The person's skin heals over the port insertion site. The port must be accessed using a special needle. RISKS AND COMPLICATIONS Any type of central line has risks that you should be aware of. Some of these include:   Infection.   Clotting of the central line.   Bleeding from the insertion site of the central line.   Developing a hole or crack within the central line. If this happens, the central line will need to be replaced.   Developing an abnormal heart rhythm (rare). The heart may beat rapidly or "skip" beats.   A collapsed lung during insertion (rare). HOME CARE INSTRUCTIONS   Follow your health care provider's specific instructions for the type of device that you have.  Keep any bandages (dressings) over the central lineclean and dry. Wash your hands before a dressing change, and change dressings as directed by your health care provider. Look for redness or irritation at the insertion site during a dressing change.  Keep the insertion site of your central line clean at all times.   Wash your hands and clean the central line hub with rubbing alcohol before you flush it.   If the central line accidentally gets pulled on, make sure that the dressing is okay, that there is no bleeding, and that the line has not been pulled out.   Limit lifting, using your arm, or performing other activity as directed by   your health care provider.  Swim or bathe only if your health care provider approves. Your health care provider can instruct you on how to keep your specific type of dressing from getting wet. SEEK IMMEDIATE MEDICAL CARE IF:   You have chills.  You have a fever.   You have shortness of breath or difficulty breathing.   You have chest pain.   You feel your heart beating rapidly or "skipping" beats.   You feel dizzy or faint.  You have bleeding from the  insertion site that does not stop.   You have pain, redness, swelling, or drainage at the insertion site of the central line.   You have swelling in your neck, face, chest, or arm on the side of your central line.   Your central line is difficult to flush or will not flush.  You do not get a blood return from the central line.  You have a hole or tear in the catheter.   Your catheter leaks when flushed or when fluids are infused into it. MAKE SURE YOU:   Understand these instructions.  Will watch your condition.  Will get help right away if you are not doing well or get worse.   This information is not intended to replace advice given to you by your health care provider. Make sure you discuss any questions you have with your health care provider.   Document Released: 05/15/2005 Document Revised: 03/29/2013 Document Reviewed: 01/01/2012 Elsevier Interactive Patient Education [employer].

## 2015-10-29 ENCOUNTER — Ambulatory Visit
Admission: RE | Admit: 2015-10-29 | Discharge: 2015-10-29 | Disposition: A | Discharge status: Home / Self Care | Payer: Medicare Other | Source: Ambulatory Visit | Attending: Internal Medicine | Admitting: Internal Medicine

## 2015-10-29 ENCOUNTER — Other Ambulatory Visit: Payer: Self-pay | Admitting: Internal Medicine

## 2015-10-29 DIAGNOSIS — I2510 Atherosclerotic heart disease of native coronary artery without angina pectoris: Secondary | ICD-10-CM | POA: Diagnosis not present

## 2015-10-29 DIAGNOSIS — R0602 Shortness of breath: Secondary | ICD-10-CM

## 2015-10-29 DIAGNOSIS — R0609 Other forms of dyspnea: Principal | ICD-10-CM

## 2015-10-29 DIAGNOSIS — C3430 Malignant neoplasm of lower lobe, unspecified bronchus or lung: Secondary | ICD-10-CM | POA: Diagnosis not present

## 2015-10-29 DIAGNOSIS — J449 Chronic obstructive pulmonary disease, unspecified: Secondary | ICD-10-CM | POA: Diagnosis not present

## 2015-10-29 DIAGNOSIS — I10 Essential (primary) hypertension: Secondary | ICD-10-CM | POA: Diagnosis not present

## 2015-10-29 DIAGNOSIS — R0789 Other chest pain: Secondary | ICD-10-CM | POA: Diagnosis not present

## 2015-10-29 DIAGNOSIS — R05 Cough: Secondary | ICD-10-CM | POA: Diagnosis not present

## 2015-10-29 DIAGNOSIS — F329 Major depressive disorder, single episode, unspecified: Secondary | ICD-10-CM | POA: Diagnosis not present

## 2015-10-29 DIAGNOSIS — E782 Mixed hyperlipidemia: Secondary | ICD-10-CM | POA: Diagnosis not present

## 2015-10-29 MED ORDER — IOPAMIDOL (ISOVUE-370) INJECTION 76%
100.0000 mL | Freq: Once | INTRAVENOUS | Status: AC | PRN
  Administered 2015-10-29: 100 mL via INTRAVENOUS

## 2015-10-30 ENCOUNTER — Encounter: Payer: Self-pay | Admitting: Nurse Practitioner

## 2015-10-30 ENCOUNTER — Ambulatory Visit (INDEPENDENT_AMBULATORY_CARE_PROVIDER_SITE_OTHER): Payer: Medicare Other | Admitting: Nurse Practitioner

## 2015-10-30 VITALS — BP 110/70 | HR 79 | Ht 70.0 in | Wt 195.4 lb

## 2015-10-30 DIAGNOSIS — I2583 Coronary atherosclerosis due to lipid rich plaque: Principal | ICD-10-CM

## 2015-10-30 DIAGNOSIS — I2510 Atherosclerotic heart disease of native coronary artery without angina pectoris: Secondary | ICD-10-CM

## 2015-10-30 DIAGNOSIS — R0600 Dyspnea, unspecified: Secondary | ICD-10-CM

## 2015-10-30 DIAGNOSIS — R05 Cough: Secondary | ICD-10-CM | POA: Diagnosis not present

## 2015-10-30 DIAGNOSIS — E785 Hyperlipidemia, unspecified: Secondary | ICD-10-CM | POA: Diagnosis not present

## 2015-10-30 DIAGNOSIS — R0609 Other forms of dyspnea: Secondary | ICD-10-CM | POA: Diagnosis not present

## 2015-10-30 DIAGNOSIS — R059 Cough, unspecified: Secondary | ICD-10-CM

## 2015-10-30 LAB — CBC
HCT: 42.4 % (ref 38.5–50.0)
Hemoglobin: 14.2 g/dL (ref 13.2–17.1)
MCH: 30.2 pg (ref 27.0–33.0)
MCHC: 33.5 g/dL (ref 32.0–36.0)
MCV: 90.2 fL (ref 80.0–100.0)
MPV: 9.9 fL (ref 7.5–12.5)
Platelets: 275 10*3/uL (ref 140–400)
RBC: 4.7 MIL/uL (ref 4.20–5.80)
RDW: 14.1 % (ref 11.0–15.0)
WBC: 7.3 10*3/uL (ref 3.8–10.8)

## 2015-10-30 LAB — BASIC METABOLIC PANEL
BUN: 17 mg/dL (ref 7–25)
CO2: 26 mmol/L (ref 20–31)
Calcium: 9.4 mg/dL (ref 8.6–10.3)
Chloride: 102 mmol/L (ref 98–110)
Creat: 0.93 mg/dL (ref 0.70–1.18)
Glucose, Bld: 87 mg/dL (ref 65–99)
Potassium: 4.6 mmol/L (ref 3.5–5.3)
Sodium: 139 mmol/L (ref 135–146)

## 2015-10-30 LAB — TSH: TSH: 5.6 mIU/L — ABNORMAL HIGH (ref 0.40–4.50)

## 2015-10-30 NOTE — Progress Notes (Signed)
CARDIOLOGY OFFICE NOTE  Date:  10/30/2015    [name] Fields Date of Birth: 1934-06-19 Medical Record #[id number]  PCP:  [name], MD  Cardiologist:  [name]  Chief Complaint  Patient presents with  . Shortness of Breath  . Cough    Work in visit - seen for Dr. Marlou Porch    History of Present Illness: [name] Fields is a 81 y.o. male who presents today for a work in visit. Seen for Dr. Marlou Porch. He is a former patient of Dr. Susa Simmonds that I cared for.   He has a history of coronary artery disease with prior lateral wall MI status post multiple stents in the past. Other issues include lung cancer in both sides (treated with radiation), melanoma, fibrosis, recurrent pleural effusion.  He has had a pericardial window for an effusion back in 2011.   I have not seen him in almost 5 years. Now followed by Dr. Marlou Porch.   Seen by Dr. Inda Merlin yesterday. Referred for CXR.   Comes in today. Here alone. Reports a cold and cough since April. He is more short of breath. Says this is just not getting better. Can't get out much due to his breathing. No fevers or chills. Cough is productive for green sputum at times. Pretty sedentary due to his breathing. Admits that his memory is pretty poor. No chest pain. Sounds like he has had some falls. CXR and CT from yesterday noted. He is followed by Dr. Marin Olp for oncology.   Past Medical History:  Diagnosis Date  . Anxiety    takes wellbutrin daily  . Arthritis    knees  . Diverticulosis   . GERD (gastroesophageal reflux disease)   . Hearing loss   . Hx of radiation therapy 09/29/11;10/01/11;10/03/11;10/06/11;10/08/11;   RLLlung,50Gy/86f - SBRT  . Hyperlipidemia   . Hypertension   . Hypokinesia    MILD INFERIOR  . IHD (ischemic heart disease)    Remote MI in 2001 with stent to the LCX  . Lactose intolerance   . Non-small cell carcinoma of lung ([place]    Dx in October of 2010  . Normal nuclear stress test 2009   EF 63%.  No ischemia. 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  . Pericardial effusion July 2011   s/p pericardial window; last echo in August 2012 with minimal effusion  . Pleural effusion, left   . SOB (shortness of breath)    Chronic with known fibrosis  . Status post chemotherapy 06/04/2009 -  09/06/2009    4 cycles of Cisplatin and Gemcitabine with Neulasta Support   . Vertigo    hx. of- ambulates with cane for safety.    Past Surgical History:  Procedure Laterality Date  . CARDIAC CATHETERIZATION  07/11/2003   EF 60%, REPEAT, WHICH SHOWED 30% PROXIMAL NARROWING IN THE RIGHT  CORONARY ARTERY  . CARDIOVASCULAR STRESS TEST  05/2007   EF 63%  . CATARACT EXTRACTION, BILATERAL Bilateral   . COLONOSCOPY WITH PROPOFOL N/A 01/30/2015   Procedure: COLONOSCOPY WITH PROPOFOL;  Surgeon: MGarlan Fair MD;  Location: [facility];  Service: Endoscopy;  Laterality: N/A;  . CORONARY STENT PLACEMENT  07/1999   STENT TO THE LEFT CIRCUMFLEX  . HERNIA REPAIR     as an adult- R inguinal   . PERICARDIAL WINDOW  July 2011  . TRANSTHORACIC ECHOCARDIOGRAM  02/22/2010   EF 55-60%     Medications: Current Outpatient Prescriptions  Medication Sig Dispense Refill  . aspirin 81  MG chewable tablet Chew 1 tablet (81 mg total) by mouth daily. 30 tablet 1  . atorvastatin (LIPITOR) 20 MG tablet TAKE 1 TABLET BY MOUTH DAILY 90 tablet 1  . buPROPion (WELLBUTRIN SR) 150 MG 12 hr tablet Take 150 mg by mouth daily.     . cyanocobalamin 500 MCG tablet Take 1 tablet (500 mcg total) by mouth daily. 30 tablet 0  . fluticasone furoate-vilanterol (BREO ELLIPTA) 200-25 MCG/INH AEPB 1 inhalation    . Linaclotide (LINZESS) 145 MCG CAPS capsule Take 145 mcg by mouth daily as needed (constipation).    . metoprolol tartrate (LOPRESSOR) 25 MG tablet TAKE 1/2 TABLET BY MOUTH TWICE DAILY 90 tablet 2  . pantoprazole (PROTONIX) 40 MG tablet Take 40 mg by mouth daily.    . Tamsulosin HCl (FLOMAX) 0.4 MG CAPS Take 0.4 mg by mouth daily with supper.     .  temazepam (RESTORIL) 30 MG capsule Take 30 mg by mouth at bedtime as needed for sleep.    Marland Kitchen venlafaxine XR (EFFEXOR-XR) 150 MG 24 hr capsule Take 1 capsule by mouth daily.  1   No current facility-administered medications for this visit.     Allergies: Allergies  Allergen Reactions  . Sertraline Hcl Other (See Comments)    Patient does not remember the reaction    Social History: The patient  reports that he quit smoking about 21 years ago. His smoking use included Cigarettes. He has a 120.00 pack-year smoking history. He has never used smokeless tobacco. He reports that he does not drink alcohol or use drugs.   Family History: The patient's family history includes Lung cancer in his father.   Review of Systems: Please see the history of present illness.   Otherwise, the review of systems is positive for none.   All other systems are reviewed and negative.   Physical Exam: VS:  BP 110/70 (BP Location: Left Arm, Patient Position: Sitting, Cuff Size: Normal)   Pulse 79   Ht '5\' 10"'$  (1.778 m)   Wt 195 lb 6.4 oz (88.6 kg)   SpO2 91% Comment: at rest  BMI 28.04 kg/m  .  BMI Body mass index is 28.04 kg/m.  Wt Readings from Last 3 Encounters:  10/30/15 195 lb 6.4 oz (88.6 kg)  07/25/15 186 lb 12.8 oz (84.7 kg)  06/04/15 195 lb (88.5 kg)    General: Pleasant. He has aged. He is alert and in no acute distress.  Poor memory. He has gained weight.  HEENT: Normal.  Neck: Supple, no JVD, carotid bruits, or masses noted.  Cardiac: Regular rate and rhythm. No murmurs, rubs, or gallops. No edema.  Respiratory:  Lungs are coarse with scattered wheezes - increased work of breathing with ambulation.  GI: Soft and nontender.  MS: No deformity or atrophy. Gait and ROM intact.  Skin: Warm and dry. Color is normal.  Neuro:  Strength and sensation are intact and no gross focal deficits noted.  Psych: Alert, appropriate and with normal affect.   LABORATORY DATA:  EKG:  EKG is not ordered  today.   Lab Results  Component Value Date   WBC 8.3 06/04/2015   HGB 14.3 06/04/2015   HCT 43.9 06/04/2015   PLT 121 (L) 06/04/2015   GLUCOSE 98 06/04/2015   ALT 29 06/04/2015   AST 30 06/04/2015   NA 141 06/04/2015   K 4.2 06/04/2015   CL 104 12/28/2014   CREATININE 1.0 06/04/2015   BUN 13.5 06/04/2015   CO2  27 06/04/2015   TSH 8.802 (H) 12/20/2014   INR 1.17 12/20/2014   HGBA1C 5.6 12/20/2014    BNP (last 3 results)  Recent Labs  12/20/14 1052  BNP 126.3*    ProBNP (last 3 results) No results for input(s): PROBNP in the last 8760 hours.   Other Studies Reviewed Today:  CXR IMPRESSION: Chronic volume loss with loculated effusions on the left. Scarring right base. Stable cardiac silhouette. No new opacity. Overall stable study. Electronically Signed   By: Lowella Grip III M.D.   On: 10/29/2015 16:13  CT CHEST  1. Today's study demonstrates stable postradiation changes of mass-like fibrosis both in the paramediastinal aspect of the left upper lobe and in the central infrahilar region of the right lower lobe. No definite findings to suggest local recurrence of disease or definite metastatic disease in the thorax. 2. There is a new nondisplaced fracture of the posterolateral aspect of the left sixth rib. This may be pathologic secondary to postradiation osteonecrosis given the proximity to the treated area in the left lung. The possibility of a metastatic lesion with nondisplaced fracture is not entirely excluded, however, and close attention on followup studies is recommended. 3. Small partially loculated pericardial effusion which exerts slight mass effect upon the right heart. These findings are very similar to prior examinations. No pericardial calcification. 4. Aortic atherosclerosis, in addition to left main and 3 vessel coronary artery disease, with evidence of scarring from prior left circumflex coronary artery territory myocardial infarction,  as above. Assessment for potential risk factor modification, dietary therapy or pharmacologic therapy may be warranted, if clinically indicated. 5. Additional incidental findings, as above. Electronically Signed   By: Vinnie Langton M.D.   On: 10/29/2015 16:54   Echo Study Conclusions 12/2014  - Left ventricle: The cavity size was normal. Wall thickness was   increased in a pattern of moderate LVH. Systolic function was   normal. The estimated ejection fraction was in the range of 60%   to 65%. Wall motion was normal; there were no regional wall   motion abnormalities. Doppler parameters are consistent with   abnormal left ventricular relaxation (grade 1 diastolic   dysfunction). The E/e&' ratio is >15, suggesting elevated LV   filling pressure. - Aortic valve: Mildly calcified- there may be fusion of the non   and right coronary cusps. There was mild regurgitation. - Mitral valve: Mildly thickened leaflets . There was mild   regurgitation. - Left atrium: The atrium was normal in size. - Tricuspid valve: There was mild regurgitation. - Pulmonary arteries: PA peak pressure: 37 mm Hg (S). - Inferior vena cava: The vessel was normal in size. The   respirophasic diameter changes were in the normal range (>= 50%),   consistent with normal central venous pressure. - Pericardium, extracardiac: A trivial pericardial effusion was   identified. Features were not consistent with tamponade   physiology.  Impressions:  - Compared to a prior echo in 2012, there are a few changes. LV   filling pressures appear elevated. There is a persistent trivial   pericardial effusion without tamponade features. RVSP is higher   and there is now moderate LVH.   Assessment/Plan: 1. Cough and shortness of breath - suspect this more related to prior radiation. Noted to have fibrosis on CT. Oxygen sats are 91 to 95% with ambulation here in the office. I will send him for PFTs with diffusion and refer  on to Pulmonary. He has had an echo with very  little change noted back in September. Will check follow up lab. Further disposition to follow.   2. CAD - no active chest pain noted. Would favor more conservative management.   3. Memory impairment - he has really declined since I last saw him 5 years ago.   4. Past pericardial effusion - s/p window - recent CT findings basically unchanged.   5. Prior lung cancers/melanoma - seeing Dr. Marin Olp in [place].  Current medicines are reviewed with the patient today.  The patient does not have concerns regarding medicines other than what has been noted above.  The following changes have been made:  See above.  Labs/ tests ordered today include:    Orders Placed This Encounter  Procedures  . Brain natriuretic peptide  . Basic metabolic panel  . CBC  . TSH  . Ambulatory referral to Pulmonology  . Pulmonary function test     Disposition:   FU with me in 3 months.  Patient is agreeable to this plan and will call if any problems develop in the interim.   Signed: Burtis Junes, RN, ANP-C 10/30/2015 11:00 AM  [facility] [place] [address] [place], [place]  [address] Phone: [phone] Fax: [phone]

## 2015-10-30 NOTE — Patient Instructions (Signed)
We will be checking the following labs today - BMET, BNP, CBC and TSH   Medication Instructions:    Continue with your current medicines.     Testing/Procedures To Be Arranged:  PFTs with diffusion at [facility]  Referral to pulmonary - Dr. Ashok Cordia  Follow-Up:   See me in 3 months    Other Special Instructions:   N/A    If you need a refill on your cardiac medications before your next appointment, please call your pharmacy.   Call the [place] office at [phone] if you have any questions, problems or concerns.

## 2015-10-31 LAB — BRAIN NATRIURETIC PEPTIDE: Brain Natriuretic Peptide: 124.6 pg/mL — ABNORMAL HIGH (ref <100)

## 2015-11-05 ENCOUNTER — Ambulatory Visit (HOSPITAL_COMMUNITY)
Admission: RE | Admit: 2015-11-05 | Discharge: 2015-11-05 | Disposition: A | Discharge status: Home / Self Care | Payer: Medicare Other | Source: Ambulatory Visit | Attending: Nurse Practitioner | Admitting: Nurse Practitioner

## 2015-11-05 DIAGNOSIS — R0609 Other forms of dyspnea: Secondary | ICD-10-CM | POA: Diagnosis not present

## 2015-11-05 LAB — PULMONARY FUNCTION TEST
DL/VA % pred: 76 %
DL/VA: 3.46 ml/min/mmHg/L
DLCO unc % pred: 32 %
DLCO unc: 10.12 ml/min/mmHg
FEF 25-75 Post: 0.92 L/sec
FEF 25-75 Pre: 0.73 L/sec
FEF2575-%Change-Post: 27 %
FEF2575-%Pred-Post: 47 %
FEF2575-%Pred-Pre: 37 %
FEV1-%Change-Post: 8 %
FEV1-%Pred-Post: 47 %
FEV1-%Pred-Pre: 43 %
FEV1-Post: 1.32 L
FEV1-Pre: 1.22 L
FEV1FVC-%Change-Post: 7 %
FEV1FVC-%Pred-Pre: 91 %
FEV6-%Change-Post: 0 %
FEV6-%Pred-Post: 50 %
FEV6-%Pred-Pre: 50 %
FEV6-Post: 1.85 L
FEV6-Pre: 1.85 L
FEV6FVC-%Change-Post: 0 %
FEV6FVC-%Pred-Post: 107 %
FEV6FVC-%Pred-Pre: 107 %
FVC-%Change-Post: 0 %
FVC-%Pred-Post: 47 %
FVC-%Pred-Pre: 47 %
FVC-Post: 1.87 L
FVC-Pre: 1.86 L
Post FEV1/FVC ratio: 71 %
Post FEV6/FVC ratio: 100 %
Pre FEV1/FVC ratio: 66 %
Pre FEV6/FVC Ratio: 100 %
RV % pred: 86 %
RV: 2.23 L
TLC % pred: 58 %
TLC: 4.01 L

## 2015-11-05 MED ORDER — ALBUTEROL SULFATE (2.5 MG/3ML) 0.083% IN NEBU
2.5000 mg | INHALATION_SOLUTION | Freq: Once | RESPIRATORY_TRACT | Status: AC
  Administered 2015-11-05: 2.5 mg via RESPIRATORY_TRACT

## 2015-11-07 ENCOUNTER — Other Ambulatory Visit: Payer: Self-pay | Admitting: *Deleted

## 2015-11-07 DIAGNOSIS — R942 Abnormal results of pulmonary function studies: Secondary | ICD-10-CM

## 2015-11-07 DIAGNOSIS — R0609 Other forms of dyspnea: Secondary | ICD-10-CM | POA: Diagnosis not present

## 2015-12-03 ENCOUNTER — Ambulatory Visit (HOSPITAL_BASED_OUTPATIENT_CLINIC_OR_DEPARTMENT_OTHER): Payer: Medicare Other | Admitting: Hematology & Oncology

## 2015-12-03 ENCOUNTER — Encounter: Payer: Self-pay | Admitting: Hematology & Oncology

## 2015-12-03 ENCOUNTER — Other Ambulatory Visit (HOSPITAL_BASED_OUTPATIENT_CLINIC_OR_DEPARTMENT_OTHER): Payer: Medicare Other

## 2015-12-03 ENCOUNTER — Ambulatory Visit (HOSPITAL_BASED_OUTPATIENT_CLINIC_OR_DEPARTMENT_OTHER): Payer: Medicare Other

## 2015-12-03 DIAGNOSIS — I2510 Atherosclerotic heart disease of native coronary artery without angina pectoris: Secondary | ICD-10-CM

## 2015-12-03 DIAGNOSIS — Z85118 Personal history of other malignant neoplasm of bronchus and lung: Secondary | ICD-10-CM | POA: Diagnosis not present

## 2015-12-03 DIAGNOSIS — Z95828 Presence of other vascular implants and grafts: Secondary | ICD-10-CM

## 2015-12-03 DIAGNOSIS — C3490 Malignant neoplasm of unspecified part of unspecified bronchus or lung: Secondary | ICD-10-CM

## 2015-12-03 DIAGNOSIS — R062 Wheezing: Secondary | ICD-10-CM

## 2015-12-03 DIAGNOSIS — C3431 Malignant neoplasm of lower lobe, right bronchus or lung: Secondary | ICD-10-CM

## 2015-12-03 DIAGNOSIS — J4541 Moderate persistent asthma with (acute) exacerbation: Secondary | ICD-10-CM

## 2015-12-03 LAB — CBC WITH DIFFERENTIAL (CANCER CENTER ONLY)
BASO#: 0 10*3/uL (ref 0.0–0.2)
BASO%: 0.5 % (ref 0.0–2.0)
EOS ABS: 0.3 10*3/uL (ref 0.0–0.5)
EOS%: 3.3 % (ref 0.0–7.0)
HCT: 47.3 % (ref 38.7–49.9)
HEMOGLOBIN: 15.7 g/dL (ref 13.0–17.1)
LYMPH#: 1.1 10*3/uL (ref 0.9–3.3)
LYMPH%: 13 % — AB (ref 14.0–48.0)
MCH: 30.3 pg (ref 28.0–33.4)
MCHC: 33.2 g/dL (ref 32.0–35.9)
MCV: 91 fL (ref 82–98)
MONO#: 0.9 10*3/uL (ref 0.1–0.9)
MONO%: 10.5 % (ref 0.0–13.0)
NEUT%: 72.7 % (ref 40.0–80.0)
NEUTROS ABS: 6 10*3/uL (ref 1.5–6.5)
Platelets: 251 10*3/uL (ref 145–400)
RBC: 5.19 10*6/uL (ref 4.20–5.70)
RDW: 13.7 % (ref 11.1–15.7)
WBC: 8.2 10*3/uL (ref 4.0–10.0)

## 2015-12-03 LAB — COMPREHENSIVE METABOLIC PANEL
ALBUMIN: 3.9 g/dL (ref 3.5–5.0)
ALK PHOS: 121 U/L (ref 40–150)
ALT: 13 U/L (ref 0–55)
AST: 20 U/L (ref 5–34)
Anion Gap: 10 mEq/L (ref 3–11)
BUN: 19.6 mg/dL (ref 7.0–26.0)
CO2: 27 mEq/L (ref 22–29)
Calcium: 9.9 mg/dL (ref 8.4–10.4)
Chloride: 104 mEq/L (ref 98–109)
Creatinine: 1 mg/dL (ref 0.7–1.3)
EGFR: 76 mL/min/{1.73_m2} — AB (ref >90)
GLUCOSE: 92 mg/dL (ref 70–140)
POTASSIUM: 4.4 meq/L (ref 3.5–5.1)
SODIUM: 141 meq/L (ref 136–145)
Total Bilirubin: 0.97 mg/dL (ref 0.20–1.20)
Total Protein: 7.9 g/dL (ref 6.4–8.3)

## 2015-12-03 LAB — LACTATE DEHYDROGENASE: LDH: 176 U/L (ref 125–245)

## 2015-12-03 MED ORDER — IPRATROPIUM-ALBUTEROL 0.5-2.5 (3) MG/3ML IN SOLN: 3.0000 mL | Freq: Four times a day (QID) | RESPIRATORY_TRACT | Status: DC

## 2015-12-03 MED ORDER — IPRATROPIUM BROMIDE 0.02 % IN SOLN
0.5000 mg | Freq: Once | RESPIRATORY_TRACT | Status: AC
  Administered 2015-12-03: 0.5 mg via RESPIRATORY_TRACT

## 2015-12-03 MED ORDER — SODIUM CHLORIDE 0.9% FLUSH
10.0000 mL | INTRAVENOUS | Status: AC | PRN
  Administered 2015-12-03: 10 mL
  Filled 2015-12-03: qty 10

## 2015-12-03 MED ORDER — IPRATROPIUM BROMIDE 0.02 % IN SOLN
RESPIRATORY_TRACT | Status: AC
  Filled 2015-12-03: qty 2.5

## 2015-12-03 MED ORDER — METHYLPREDNISOLONE 4 MG PO TBPK: ORAL_TABLET | ORAL | 0 refills | Status: DC

## 2015-12-03 MED ORDER — HEPARIN SOD (PORK) LOCK FLUSH 100 UNIT/ML IV SOLN
500.0000 [IU] | INTRAVENOUS | Status: AC | PRN
  Administered 2015-12-03: 500 [IU]
  Filled 2015-12-03: qty 5

## 2015-12-03 MED ORDER — ALBUTEROL SULFATE (2.5 MG/3ML) 0.083% IN NEBU
2.5000 mg | INHALATION_SOLUTION | Freq: Once | RESPIRATORY_TRACT | Status: AC
  Administered 2015-12-03: 2.5 mg via RESPIRATORY_TRACT
  Filled 2015-12-03: qty 3

## 2015-12-03 NOTE — Patient Instructions (Signed)

## 2015-12-03 NOTE — Progress Notes (Signed)
Hematology and Oncology Follow Up Visit  [name] Fields [id number] 08/30/1934 81 y.o. 12/03/2015   Principle Diagnosis:  1. 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 Unfortunately, he sees me having a lot of lung issues. He saw his family doctor. He saw his cardiologist. He had a pulmonary function test done which seemed to show impaired lung function. He sees a pulmonologist in 2 weeks.  He has a lot of wheezing. He gets short of breath with mild exertion.  He did have a CT angiogram done. This did not show any evidence of recurrent malignancy. There is no pulmonary embolism. He had what appeared to be some fibrosis. I don't know if this is from his past treatments.   Thankfully, there is no obvious cancer that we have to deal with right now.  I will go ahead and give him a nebulizer in the office. He is wheezing quite a bit. I did this may help him feel a little better.  Overall, his history of malignancy is not "catching up with him". I cannot find anything that looks malignant.   He does have some vertigo. He has not fallen recently.   Medications:    Medication List       Accurate as of 12/03/15  9:14 AM. Always use your most recent med list.          aspirin 81 MG chewable tablet Chew 1 tablet (81 mg total) by mouth daily.   atorvastatin 20 MG tablet Commonly known as:  LIPITOR TAKE 1 TABLET BY MOUTH DAILY   BREO ELLIPTA 200-25 MCG/INH Aepb Generic drug:  fluticasone furoate-vilanterol 1 inhalation   BREO ELLIPTA 200-25 MCG/INH Aepb Generic drug:  fluticasone furoate-vilanterol 1 inhalation   buPROPion 150 MG 12 hr tablet Commonly known as:  WELLBUTRIN SR Take 150 mg by mouth daily.   cyanocobalamin 500 MCG  tablet Take 1 tablet (500 mcg total) by mouth daily.   dutasteride 0.5 MG capsule Commonly known as:  AVODART   LINZESS 145 MCG Caps capsule Generic drug:  linaclotide Take 145 mcg by mouth daily as needed (constipation).   methylPREDNISolone 4 MG Tbpk tablet Commonly known as:  MEDROL DOSEPAK Take as directed - 6 pills today, 5 on 8/29, 4 on 8/30, tc...   metoprolol tartrate 25 MG tablet Commonly known as:  LOPRESSOR TAKE 1/2 TABLET BY MOUTH TWICE DAILY   pantoprazole 40 MG tablet Commonly known as:  PROTONIX Take 40 mg by mouth daily.   tamsulosin 0.4 MG Caps capsule Commonly known as:  FLOMAX Take 0.4 mg by mouth daily with supper.   temazepam 30 MG capsule Commonly known as:  RESTORIL 1 capsule at bedtime   venlafaxine XR 150 MG 24 hr capsule Commonly known as:  EFFEXOR-XR Take 1 capsule by mouth daily.   Vitamin B Complex Tabs       Allergies:  Allergies  Allergen Reactions  . Sertraline Hcl Other (See Comments)    Patient does not remember the reaction    Past Medical History, Surgical history, Social history, and Family History were reviewed and updated.  Review of Systems: All other 10 point review of systems is negative.   Physical Exam:  height is '5\' 10"'$  (  1.778 m) and weight is 194 lb (88 kg). His oral temperature is 97.8 F (36.6 C). His blood pressure is 114/70 and his pulse is 70. His respiration is 20 and oxygen saturation is 97%.   Wt Readings from Last 3 Encounters:  12/03/15 194 lb (88 kg)  10/30/15 195 lb 6.4 oz (88.6 kg)  07/25/15 186 lb 12.8 oz (84.7 kg)    Well-developed and well-nourished elderly male. Head and neck exam shows no ocular or oral lesions. He has no adenopathy in the neck. Lungs show wheezing bilaterally. He has decent air movement bilaterally. No rhonchi are noted. Cardiac exam regular rate and rhythm with no murmurs, rubs or bruits. Abdomen is soft. He has decent bowel sounds. There is no fluid wave. There is no  palpable liver or spleen tip. Back exam shows no tenderness over the spine, ribs or hips. Externally shows no clubbing, cyanosis or edema or skin exam shows some scattered actinic keratoses. I do not see any squamous or basal cell cancers. He does have some chronic sun damage.  Lab Results  Component Value Date   WBC 8.2 12/03/2015   HGB 15.7 12/03/2015   HCT 47.3 12/03/2015   MCV 91 12/03/2015   PLT 251 12/03/2015   Lab Results  Component Value Date   FERRITIN 92 12/20/2014   IRON 95 12/20/2014   TIBC 405 12/20/2014   UIBC 310 12/20/2014   IRONPCTSAT 23 12/20/2014   Lab Results  Component Value Date   RETICCTPCT 1.8 12/20/2014   RBC 5.19 12/03/2015   Lab Results  Component Value Date   KAPLAMBRATIO 1.98 (L) 12/20/2014   No results found for: Kandis Cocking, IGMSERUM Lab Results  Component Value Date   TOTALPROTELP 6.9 12/20/2014   ALBUMINELP 3.4 12/20/2014   A1GS 0.3 12/20/2014   A2GS 0.9 12/20/2014   BETS 1.3 12/20/2014   GAMS 1.1 12/20/2014   MSPIKE Not Observed 12/20/2014   SPEI Comment 12/20/2014     Chemistry      Component Value Date/Time   NA 139 10/30/2015 1131   NA 141 06/04/2015 0834   K 4.6 10/30/2015 1131   K 4.2 06/04/2015 0834   CL 102 10/30/2015 1131   CL 104 12/28/2014 1349   CL 104 08/27/2012 1421   CO2 26 10/30/2015 1131   CO2 27 06/04/2015 0834   BUN 17 10/30/2015 1131   BUN 13.5 06/04/2015 0834   CREATININE 0.93 10/30/2015 1131   CREATININE 1.0 06/04/2015 0834      Component Value Date/Time   CALCIUM 9.4 10/30/2015 1131   CALCIUM 9.7 06/04/2015 0834   ALKPHOS 139 06/04/2015 0834   AST 30 06/04/2015 0834   ALT 29 06/04/2015 0834   BILITOT 0.90 06/04/2015 0834     Impression and Plan: [name] Fields is 81 year old white male history of lung cancer and melanoma. His most recent malignancy was stage I lung cancer diagnosed in April 2013 and treated with stereotactic radiosurgery in July 2013. So far, he has done well and there has been no  evidence of recurrence.   Hoefer, his lung function will improve. I'm sure that pulmonary medicine will help out quite a bit.   I want to see him back in about 3 months. I think we have to see him back sooner so we get help follow-up with him.   Again I think he has chronic obstructive lung disease. He may be having an asthmatic-type episode. Again I will give him a nebulizer in the office.  I will also put him on a Medrol dose pack.   I spent about 25 minutes with him today.  Volanda Napoleon, MD 8/28/20179:14 AM

## 2015-12-03 NOTE — Progress Notes (Signed)
[name] presented for Portacath access and flush. Proper placement of portacath confirmed by CXR. Portacath located in the right chest wall accessed with  H 20 needle. Clean, Dry and Intact Good blood return present. Portacath flushed with 72m NS and 500U/580mHeparin per protocol and needle removed intact. Procedure without incident. Patient tolerated procedure well.

## 2015-12-13 ENCOUNTER — Institutional Professional Consult (permissible substitution): Payer: Medicare Other | Admitting: Pulmonary Disease

## 2015-12-18 ENCOUNTER — Other Ambulatory Visit: Payer: Medicare Other

## 2015-12-18 ENCOUNTER — Encounter: Payer: Self-pay | Admitting: Pulmonary Disease

## 2015-12-18 ENCOUNTER — Ambulatory Visit (INDEPENDENT_AMBULATORY_CARE_PROVIDER_SITE_OTHER): Payer: Medicare Other | Admitting: Pulmonary Disease

## 2015-12-18 VITALS — BP 132/70 | HR 70 | Ht 70.0 in | Wt 195.2 lb

## 2015-12-18 DIAGNOSIS — J439 Emphysema, unspecified: Secondary | ICD-10-CM | POA: Diagnosis not present

## 2015-12-18 DIAGNOSIS — Z23 Encounter for immunization: Secondary | ICD-10-CM

## 2015-12-18 DIAGNOSIS — J449 Chronic obstructive pulmonary disease, unspecified: Secondary | ICD-10-CM | POA: Diagnosis not present

## 2015-12-18 DIAGNOSIS — J984 Other disorders of lung: Secondary | ICD-10-CM

## 2015-12-18 DIAGNOSIS — I2510 Atherosclerotic heart disease of native coronary artery without angina pectoris: Secondary | ICD-10-CM

## 2015-12-18 DIAGNOSIS — C3490 Malignant neoplasm of unspecified part of unspecified bronchus or lung: Secondary | ICD-10-CM

## 2015-12-18 MED ORDER — TIOTROPIUM BROMIDE MONOHYDRATE 2.5 MCG/ACT IN AERS: 2.0000 | INHALATION_SPRAY | Freq: Every day | RESPIRATORY_TRACT | 3 refills | Status: DC

## 2015-12-18 MED ORDER — AEROCHAMBER Z-STAT PLUS CHAMBR MISC: 0 refills | Status: DC

## 2015-12-18 MED ORDER — BUDESONIDE-FORMOTEROL FUMARATE 160-4.5 MCG/ACT IN AERO: 2.0000 | INHALATION_SPRAY | Freq: Two times a day (BID) | RESPIRATORY_TRACT | 6 refills | Status: DC

## 2015-12-18 NOTE — Patient Instructions (Addendum)
   I'm switching you off of Breo over to Symbicort & Spiriva inhalers.   Remember to use a spacer with your Symbicort inhaler.  You should rinse, gargle & spit after your Symbicort inhaler to make sure you don't get any thrush in your mouth.  If your urine stream gets any worse stop using your Spiriva.  We will do a walking and repeat breathing test when I see you back.   Call me if you have any questions or new breathing problems before your next appointment.  I will see you back in 6 weeks or sooner if needed.  Remember to get your Flu shot in 3-4 weeks from today.  TESTS ORDERED: 1. Serum Alpha-1 Antitrypsin Phenotype 2. Spirometry with bronchodilator challenge at next appointment 3. 6MWT on room air at next appointment

## 2015-12-18 NOTE — Progress Notes (Signed)
Subjective:    Patient ID: [name] Fields, male    DOB: 01-Jun-1934, 81 y.o.   MRN: [id number]  HPI He reports he started having breathing problems starting 2 years ago. He was found to have right-sided lung cancer and underwent surgical resection about 5 years ago. He was found to have left-sided lung cancer since then and was treated with chemotherapy & XRT. He was started on Breo about 1 year ago to help his dyspnea. He isn't sure if it has significantly helped his dyspnea. He reports he does walk for 30 minutes regularly. Previously was able to walk 1.5 hours a couple of years ago. Does walk with a cane. Denies any chest tightness, pressure, or palpitations. Does have intermittent coughing productive of a green or brown mucus. This is small in volume. No fever, chills, or sweats. No nausea, emesis, or diarrhea.  Review of Systems Denies any headaches or recent syncope. He does have some positional vertigo. Does have some uriinary hesitancy. Denies any dysuria. A pertinent 14 point review of systems is negative except as per the history of presenting illness.  Allergies  Allergen Reactions  . Sertraline Hcl Other (See Comments)    Patient does not remember the reaction    Current Outpatient Prescriptions on File Prior to Visit  Medication Sig Dispense Refill  . aspirin 81 MG chewable tablet Chew 1 tablet (81 mg total) by mouth daily. 30 tablet 1  . atorvastatin (LIPITOR) 20 MG tablet TAKE 1 TABLET BY MOUTH DAILY 90 tablet 1  . B Complex Vitamins (VITAMIN B COMPLEX) TABS     . buPROPion (WELLBUTRIN SR) 150 MG 12 hr tablet Take 150 mg by mouth daily.     . cyanocobalamin 500 MCG tablet Take 1 tablet (500 mcg total) by mouth daily. 30 tablet 0  . dutasteride (AVODART) 0.5 MG capsule Take 0.5 mg by mouth daily.     . fluticasone furoate-vilanterol (BREO ELLIPTA) 200-25 MCG/INH AEPB 1 inhalation    . Linaclotide (LINZESS) 145 MCG CAPS capsule Take 145 mcg by mouth daily as needed  (constipation).    . metoprolol tartrate (LOPRESSOR) 25 MG tablet TAKE 1/2 TABLET BY MOUTH TWICE DAILY 90 tablet 2  . pantoprazole (PROTONIX) 40 MG tablet Take 40 mg by mouth daily.    . Tamsulosin HCl (FLOMAX) 0.4 MG CAPS Take 0.4 mg by mouth daily with supper.     . temazepam (RESTORIL) 30 MG capsule 1 capsule at bedtime    . venlafaxine XR (EFFEXOR-XR) 150 MG 24 hr capsule Take 1 capsule by mouth daily.  1   No current facility-administered medications on file prior to visit.     Past Medical History:  Diagnosis Date  . Anxiety    takes wellbutrin daily  . Arthritis    knees  . COPD (chronic obstructive pulmonary disease) ([place])   . Diverticulosis   . Emphysema of lung ([place])   . GERD (gastroesophageal reflux disease)   . Hearing loss   . Hx of radiation therapy 09/29/11;10/01/11;10/03/11;10/06/11;10/08/11;   RLLlung,50Gy/92f - SBRT  . Hyperlipidemia   . Hypertension   . Hypokinesia    MILD INFERIOR  . IHD (ischemic heart disease)    Remote MI in 2001 with stent to the LCX  . Lactose intolerance   . [place] cell [place]    Dx in October of 2010  . Normal nuclear stress test 2009   EF 63%. No ischemia. 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  . Pericardial  effusion July 2011   s/p pericardial window; last echo in August 2012 with minimal effusion  . Pleural effusion, left   . SOB (shortness of breath)    Chronic with known fibrosis  . Status post chemotherapy 06/04/2009 -  09/06/2009    4 cycles of Cisplatin and Gemcitabine with Neulasta Support   . Vertigo    hx. of- ambulates with cane for safety.    Past Surgical History:  Procedure Laterality Date  . CARDIAC CATHETERIZATION  07/11/2003   EF 60%, REPEAT, WHICH SHOWED 30% PROXIMAL NARROWING IN THE RIGHT  CORONARY ARTERY  . CARDIOVASCULAR STRESS TEST  05/2007   EF 63%  . CATARACT EXTRACTION, BILATERAL Bilateral   . COLONOSCOPY WITH PROPOFOL N/A 01/30/2015   Procedure: COLONOSCOPY WITH PROPOFOL;  Surgeon: Garlan Fair,  MD;  Location: [facility];  Service: Endoscopy;  Laterality: N/A;  . CORONARY STENT PLACEMENT  07/1999   STENT TO THE LEFT CIRCUMFLEX  . HERNIA REPAIR     as an adult- R inguinal   . PERICARDIAL WINDOW  July 2011  . TRANSTHORACIC ECHOCARDIOGRAM  02/22/2010   EF 55-60%    Family History  Problem Relation Age of Onset  . Lung cancer Father   . Stroke Sister   . Heart disease Neg Hx   . Anesthesia problems Neg Hx   . Lung disease Neg Hx     Social History   Social History  . Marital status: Single    Spouse name: N/A  . Number of children: N/A  . Years of education: N/A   Occupational History  . retired Retired    [employer]   Social History Main Topics  . Smoking status: Former Smoker    Packs/day: 3.00    Years: 40.00    Types: Cigarettes    Quit date: 04/07/1994  . Smokeless tobacco: Never Used  . Alcohol use No  . Drug use: No  . Sexual activity: No   Other Topics Concern  . None   Social History Narrative   Originally from [place]. Previously worked for [employer]. Currently has 2 dogs. No bird or mold exposure.       Objective:   Physical Exam BP 132/70 (BP Location: Left Arm, Cuff Size: Normal)   Pulse 70   Ht '5\' 10"'$  (1.778 m)   Wt 195 lb 3.2 oz (88.5 kg)   SpO2 98%   BMI 28.01 kg/m  General:  Awake. Alert. No acute distress.  Integument:  Warm & dry. No rash on exposed skin. No bruising. Lymphatics:  No appreciated cervical or supraclavicular lymphadenoapthy. HEENT:  Moist mucus membranes. No oral ulcers. No scleral injection or icterus. Cardiovascular:  Regular rate. No edema. No appreciable JVD.  Pulmonary:  Good aeration & clear to auscultation bilaterally. Symmetric chest wall expansion. No accessory muscle use. Abdomen: Soft. Normal bowel sounds. Protuberant. Grossly nontender. Musculoskeletal:  Normal bulk and tone. Hand grip strength 5/5 bilaterally. No joint deformity or effusion appreciated. Neurological:  CN 2-12 grossly in tact. No meningismus. Moving  all 4 extremities equally. Symmetric brachioradialis deep tendon reflexes. Psychiatric:  Mood and affect congruent. Speech normal rhythm, rate & tone.   PFT 11/05/15: FVC 1.86 L (47%) FEV1 1.22 L (43%) FEV1/FVC 0.66 FEF 25-75 0.73 L (37%) negative bronchodilator response TLC 4.01 L (88%) RV 86% ERV 33% DLCO uncorrected 32%  IMAGING CTA CHEST 10/29/15 (personally reviewed by me): Heart normal in size. Loculated pericardial effusion noted. Right internal jugular Port-A-Cath noted. No pathologic mediastinal  adenopathy. Paramediastinal opacity/mass in left upper lobe that is chronic. Atelectasis of left upper lobe is also appreciated along with pleural thickening & volume loss in the left lung. Small subpleural nodules noted bilaterally that are all subcentimeter. No obvious progression. Diffuse centrilobular & paraseptal emphysema.  CARDIAC TTE (12/20/14): Moderate LVH with EF 60-65%. No regional wall motion abnormalities. Grade 1 diastolic dysfunction. LA & RA normal in size. RV normal in size and function. Pulmonary artery systolic pressure 37 mmHg. Mild aortic regurgitation. No aortic dilatation. Mild mitral regurgitation. Trivial pulmonic regurgitation. Mild tricuspid regurgitation. Trivial pericardial effusion.  LABS 12/03/15 CBC: 8.2/15.7/47.3/251 BMP: 141/4.4/104/27/19.6/1.0/92/9.9 LFT: 2.9/7.9/0.97/121/20/13 LDH:  176  10/30/15 BNP:  124.6  10/26/09 ABG on 2 L/m:  7.38 / 45 / 75 / 95%    Assessment & Plan:  81 y.o. with Severe COPD w/ Emphysema & Moderate Restriction based on my review of his pulmonary function testing from July 2017. He has a known history of bilateral lung cancer & follows with Dr. Marin Olp. Reviewing the patient's chest imaging does show significant postradiation changes to his left lung which is likely the cause for his restriction seen on lung volumes. I do question whether or not he may benefit from an anticholinergic medication but I'm unsure whether or not he can  tolerate this medication with his intermittent urinary retention. I did caution the patient against the potential negative side effects. I instructed the patient contact my office if he had any new breathing problems or questions before his next appointment as it would be happy to see him sooner.  1. Severe COPD w/ Emphysema: Starting Symbicort 160/4.5 with spacer & Spiriva Respimat in place of Breo. Screening for alpha-1 antitrypsin deficiency. Repeat spirometry with bronchodilator challenge at next appointment.  2. Moderate Restrictive Lung Disease: Likely secondary to postradiation effects on left lung. Checking 6 minute walk test at next appointment.  3. NSCLC:  Follows with Dr. Marin Olp. 4. Health Maintenance:  S/P Pneumovax 27 January 2010.  administering Prevnar vaccine today. Recommended influenza vaccine and 3-4 weeks.  5. Follow-up:  Return to clinic in 6 weeks or sooner if needed.   Sonia Baller Ashok Cordia, M.D. [facility] Pager:  [phone] After 3pm or if no response, call [phone] 11:43 AM 12/18/15

## 2015-12-18 NOTE — Addendum Note (Signed)
Addended by: Inge Rise on: 12/18/2015 11:50 AM   Modules accepted: Orders

## 2015-12-19 ENCOUNTER — Telehealth: Payer: Self-pay | Admitting: Pulmonary Disease

## 2015-12-19 MED ORDER — UMECLIDINIUM BROMIDE 62.5 MCG/INH IN AEPB: 1.0000 | INHALATION_SPRAY | Freq: Every day | RESPIRATORY_TRACT | 3 refills | Status: DC

## 2015-12-19 NOTE — Telephone Encounter (Signed)
Let's switch to Incruse - 1 inhalation once daily - #1 inhaler - #3 refills. Thanks.

## 2015-12-19 NOTE — Telephone Encounter (Signed)
Spoke with pt. He is aware of this medication change. Rx has been sent in. Nothing further was needed. 

## 2015-12-19 NOTE — Telephone Encounter (Signed)
Called spoke with pt. He reports the spiriva respimat is too expensive.  He was given alternatives incruse and spiriva HH. Please advise Dr. Ashok Cordia thanks

## 2015-12-24 LAB — ALPHA-1 ANTITRYPSIN PHENOTYPE: A1 ANTITRYPSIN: 136 mg/dL (ref 83–199)

## 2016-01-08 DIAGNOSIS — M25562 Pain in left knee: Secondary | ICD-10-CM | POA: Diagnosis not present

## 2016-01-08 DIAGNOSIS — N3943 Post-void dribbling: Secondary | ICD-10-CM | POA: Diagnosis not present

## 2016-01-08 DIAGNOSIS — M179 Osteoarthritis of knee, unspecified: Secondary | ICD-10-CM | POA: Diagnosis not present

## 2016-01-08 DIAGNOSIS — I2510 Atherosclerotic heart disease of native coronary artery without angina pectoris: Secondary | ICD-10-CM | POA: Diagnosis not present

## 2016-01-08 DIAGNOSIS — Z23 Encounter for immunization: Secondary | ICD-10-CM | POA: Diagnosis not present

## 2016-01-08 DIAGNOSIS — R351 Nocturia: Secondary | ICD-10-CM | POA: Diagnosis not present

## 2016-01-08 DIAGNOSIS — J449 Chronic obstructive pulmonary disease, unspecified: Secondary | ICD-10-CM | POA: Diagnosis not present

## 2016-01-14 ENCOUNTER — Ambulatory Visit (HOSPITAL_BASED_OUTPATIENT_CLINIC_OR_DEPARTMENT_OTHER): Payer: Medicare Other

## 2016-01-14 VITALS — BP 110/64 | HR 64 | Temp 97.7°F | Resp 16

## 2016-01-14 DIAGNOSIS — Z85118 Personal history of other malignant neoplasm of bronchus and lung: Secondary | ICD-10-CM | POA: Diagnosis not present

## 2016-01-14 DIAGNOSIS — Z452 Encounter for adjustment and management of vascular access device: Secondary | ICD-10-CM | POA: Diagnosis not present

## 2016-01-14 DIAGNOSIS — C3431 Malignant neoplasm of lower lobe, right bronchus or lung: Secondary | ICD-10-CM

## 2016-01-14 MED ORDER — SODIUM CHLORIDE 0.9% FLUSH
10.0000 mL | INTRAVENOUS | Status: DC | PRN
  Administered 2016-01-14: 10 mL via INTRAVENOUS
  Filled 2016-01-14: qty 10

## 2016-01-14 MED ORDER — HEPARIN SOD (PORK) LOCK FLUSH 100 UNIT/ML IV SOLN
500.0000 [IU] | Freq: Once | INTRAVENOUS | Status: AC
  Administered 2016-01-14: 500 [IU] via INTRAVENOUS
  Filled 2016-01-14: qty 5

## 2016-01-14 NOTE — Patient Instructions (Signed)

## 2016-01-16 ENCOUNTER — Encounter: Payer: Self-pay | Admitting: Nurse Practitioner

## 2016-01-23 ENCOUNTER — Other Ambulatory Visit: Payer: Self-pay | Admitting: Cardiology

## 2016-01-29 ENCOUNTER — Encounter: Payer: Self-pay | Admitting: Nurse Practitioner

## 2016-01-29 ENCOUNTER — Ambulatory Visit (INDEPENDENT_AMBULATORY_CARE_PROVIDER_SITE_OTHER): Payer: Medicare Other | Admitting: Nurse Practitioner

## 2016-01-29 VITALS — BP 140/82 | HR 64 | Ht 70.0 in | Wt 198.8 lb

## 2016-01-29 DIAGNOSIS — E7800 Pure hypercholesterolemia, unspecified: Secondary | ICD-10-CM

## 2016-01-29 DIAGNOSIS — I2583 Coronary atherosclerosis due to lipid rich plaque: Secondary | ICD-10-CM

## 2016-01-29 DIAGNOSIS — J438 Other emphysema: Secondary | ICD-10-CM

## 2016-01-29 DIAGNOSIS — I2510 Atherosclerotic heart disease of native coronary artery without angina pectoris: Secondary | ICD-10-CM

## 2016-01-29 LAB — CBC
HCT: 43.8 % (ref 38.5–50.0)
Hemoglobin: 14.3 g/dL (ref 13.2–17.1)
MCH: 29.7 pg (ref 27.0–33.0)
MCHC: 32.6 g/dL (ref 32.0–36.0)
MCV: 91.1 fL (ref 80.0–100.0)
MPV: 11.4 fL (ref 7.5–12.5)
Platelets: 90 10*3/uL — ABNORMAL LOW (ref 140–400)
RBC: 4.81 MIL/uL (ref 4.20–5.80)
RDW: 14.5 % (ref 11.0–15.0)
WBC: 7.2 10*3/uL (ref 3.8–10.8)

## 2016-01-29 LAB — BASIC METABOLIC PANEL
BUN: 17 mg/dL (ref 7–25)
CO2: 30 mmol/L (ref 20–31)
Calcium: 8.9 mg/dL (ref 8.6–10.3)
Chloride: 105 mmol/L (ref 98–110)
Creat: 0.95 mg/dL (ref 0.70–1.18)
Glucose, Bld: 92 mg/dL (ref 65–99)
Potassium: 4.5 mmol/L (ref 3.5–5.3)
Sodium: 141 mmol/L (ref 135–146)

## 2016-01-29 MED ORDER — FUROSEMIDE 20 MG PO TABS: 20.0000 mg | ORAL_TABLET | Freq: Every day | ORAL | 6 refills | Status: DC

## 2016-01-29 NOTE — Patient Instructions (Addendum)
We will be checking the following labs today - BMET and CBC   Medication Instructions:    Continue with your current medicines.     Testing/Procedures To Be Arranged:  N/A  Follow-Up:   See me in 2 to 3 weeks    Other Special Instructions:   Try to limit your salt/eating out    If you need a refill on your cardiac medications before your next appointment, please call your pharmacy.   Call the [place] office at [phone] if you have any questions, problems or concerns.

## 2016-01-29 NOTE — Progress Notes (Signed)
CARDIOLOGY OFFICE NOTE  Date:  01/29/2016    [name] Fields Date of Birth: [birth year] Medical Record #[id number]  PCP:  [name], MD  Cardiologist:  [name]    Chief Complaint  Patient presents with  . Shortness of Breath    Follow up visit. Seen for Dr. Marlou Porch    History of Present Illness: [name] Fields is a 81 y.o. male who presents today for a follow up visit. This is a 3 month check. Seen for Dr. Marlou Porch. He is a former patient of Dr. Susa Simmonds that I cared for.   He has a history of coronary artery disease with prior lateral wall MI status post multiple stents in the past. Other issues include lung cancer in both sides (treated with radiation), melanoma, fibrosis, recurrent pleural effusion. He has had a pericardial window for an effusion back in 2011.   I saw him back in July - he had been referred back for shortness of breath. Productive cough - COPD - I sent him back to pulmonary for evaluation/treatment.   Comes in today. Here alone. Cardiac status seems to be ok. Has been given Spiriva - does not understand how to use. Very limited activity due to his breathing - says it is no worse but certainly no better. Some swelling in his legs/feet. He eats out - I suspect a fair amount. No chest pain.   Past Medical History:  Diagnosis Date  . Anxiety    takes wellbutrin daily  . Arthritis    knees  . COPD (chronic obstructive pulmonary disease) ([place])   . Diverticulosis   . Emphysema of lung ([place])   . GERD (gastroesophageal reflux disease)   . Hearing loss   . Hx of radiation therapy 09/29/11;10/01/11;10/03/11;10/06/11;10/08/11;   RLLlung,50Gy/71f - SBRT  . Hyperlipidemia   . Hypertension   . Hypokinesia    MILD INFERIOR  . IHD (ischemic heart disease)    Remote MI in 2001 with stent to the LCX  . Lactose intolerance   . Non-small cell carcinoma of lung ([place]    Dx in October of 2010  . Normal nuclear stress test 2009   EF 63%. No  ischemia. 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  . Pericardial effusion July 2011   s/p pericardial window; last echo in August 2012 with minimal effusion  . Pleural effusion, left   . SOB (shortness of breath)    Chronic with known fibrosis  . Status post chemotherapy 06/04/2009 -  09/06/2009    4 cycles of Cisplatin and Gemcitabine with Neulasta Support   . Vertigo    hx. of- ambulates with cane for safety.    Past Surgical History:  Procedure Laterality Date  . CARDIAC CATHETERIZATION  07/11/2003   EF 60%, REPEAT, WHICH SHOWED 30% PROXIMAL NARROWING IN THE RIGHT  CORONARY ARTERY  . CARDIOVASCULAR STRESS TEST  05/2007   EF 63%  . CATARACT EXTRACTION, BILATERAL Bilateral   . COLONOSCOPY WITH PROPOFOL N/A 01/30/2015   Procedure: COLONOSCOPY WITH PROPOFOL;  Surgeon: MGarlan Fair MD;  Location: [facility];  Service: Endoscopy;  Laterality: N/A;  . CORONARY STENT PLACEMENT  07/1999   STENT TO THE LEFT CIRCUMFLEX  . HERNIA REPAIR     as an adult- R inguinal   . PERICARDIAL WINDOW  July 2011  . TRANSTHORACIC ECHOCARDIOGRAM  02/22/2010   EF 55-60%     Medications: Current Outpatient Prescriptions  Medication Sig Dispense Refill  . aspirin 81 MG chewable  tablet Chew 1 tablet (81 mg total) by mouth daily. 30 tablet 1  . atorvastatin (LIPITOR) 20 MG tablet TAKE 1 TABLET BY MOUTH DAILY 90 tablet 1  . B Complex Vitamins (VITAMIN B COMPLEX) TABS     . budesonide-formoterol (SYMBICORT) 160-4.5 MCG/ACT inhaler Inhale 2 puffs into the lungs 2 (two) times daily. 1 Inhaler 6  . buPROPion (WELLBUTRIN SR) 150 MG 12 hr tablet Take 150 mg by mouth daily.     . cyanocobalamin 500 MCG tablet Take 1 tablet (500 mcg total) by mouth daily. 30 tablet 0  . dutasteride (AVODART) 0.5 MG capsule Take 0.5 mg by mouth daily.     . fluticasone furoate-vilanterol (BREO ELLIPTA) 200-25 MCG/INH AEPB 1 inhalation    . Linaclotide (LINZESS) 145 MCG CAPS capsule Take 145 mcg by mouth daily as needed (constipation).    .  metoprolol tartrate (LOPRESSOR) 25 MG tablet TAKE 1/2 TABLET BY MOUTH TWICE DAILY 90 tablet 2  . Spacer/Aero-Holding Chambers (AEROCHAMBER Z-STAT PLUS CHAMBR) MISC Use as directed 1 each 0  . Tamsulosin HCl (FLOMAX) 0.4 MG CAPS Take 0.4 mg by mouth daily with supper.     . temazepam (RESTORIL) 30 MG capsule 1 capsule at bedtime    . Tiotropium Bromide Monohydrate (SPIRIVA RESPIMAT) 2.5 MCG/ACT AERS Inhale 2 puffs into the lungs daily. 1 Inhaler 3  . umeclidinium bromide (INCRUSE ELLIPTA) 62.5 MCG/INH AEPB Inhale 1 puff into the lungs daily. 30 each 3  . venlafaxine XR (EFFEXOR-XR) 150 MG 24 hr capsule Take 1 capsule by mouth daily.  1  . furosemide (LASIX) 20 MG tablet Take 1 tablet (20 mg total) by mouth daily. 30 tablet 6   No current facility-administered medications for this visit.     Allergies: Allergies  Allergen Reactions  . Sertraline Hcl Other (See Comments)    Patient does not remember the reaction    Social History: The patient  reports that he quit smoking about 21 years ago. His smoking use included Cigarettes. He has a 120.00 pack-year smoking history. He has never used smokeless tobacco. He reports that he does not drink alcohol or use drugs.   Family History: The patient's family history includes Lung cancer in his father; Stroke in his sister.   Review of Systems: Please see the history of present illness.   Otherwise, the review of systems is positive for none.   All other systems are reviewed and negative.   Physical Exam: VS:  BP 140/82   Pulse 64   Ht '5\' 10"'$  (1.778 m)   Wt 198 lb 12.8 oz (90.2 kg)   BMI 28.52 kg/m  .  BMI Body mass index is 28.52 kg/m.  Wt Readings from Last 3 Encounters:  01/29/16 198 lb 12.8 oz (90.2 kg)  12/18/15 195 lb 3.2 oz (88.5 kg)  12/03/15 194 lb (88 kg)    General: Pleasant. Chronically ill but alert and in no acute distress. Up about 3 pounds since my last visit.   HEENT: Normal.  Neck: Supple, no JVD, carotid bruits, or  masses noted.  Cardiac: Regular rate and rhythm. No murmurs, rubs, or gallops. +1 edema.  Respiratory:  Lungs are clear to auscultation bilaterally with normal work of breathing.  GI: Soft and nontender.  MS: No deformity or atrophy. Gait and ROM intact.  Skin: Warm and dry. Color is normal.  Neuro:  Strength and sensation are intact and no gross focal deficits noted.  Psych: Alert, appropriate and with normal affect.  LABORATORY DATA:  EKG:  EKG is ordered today. This demonstrates NSR with 1st degree AV block. Anterior Q waves - chronic  Lab Results  Component Value Date   WBC 8.2 12/03/2015   HGB 15.7 12/03/2015   HCT 47.3 12/03/2015   PLT 251 12/03/2015   GLUCOSE 92 12/03/2015   ALT 13 12/03/2015   AST 20 12/03/2015   NA 141 12/03/2015   K 4.4 12/03/2015   CL 102 10/30/2015   CREATININE 1.0 12/03/2015   BUN 19.6 12/03/2015   CO2 27 12/03/2015   TSH 5.60 (H) 10/30/2015   INR 1.17 12/20/2014   HGBA1C 5.6 12/20/2014    BNP (last 3 results)  Recent Labs  10/30/15 1131  BNP 124.6*    ProBNP (last 3 results) No results for input(s): PROBNP in the last 8760 hours.   Other Studies Reviewed Today:  CXR IMPRESSION: Chronic volume loss with loculated effusions on the left. Scarring right base. Stable cardiac silhouette. No new opacity. Overall stable study. Electronically Signed By: Lowella Grip III M.D. On: 10/29/2015 16:13  CT CHEST  1. Today's study demonstrates stable postradiation changes of mass-like fibrosis both in the paramediastinal aspect of the left upper lobe and in the central infrahilar region of the right lower lobe. No definite findings to suggest local recurrence of disease or definite metastatic disease in the thorax. 2. There is a new nondisplaced fracture of the posterolateral aspect of the left sixth rib. This may be pathologic secondary to postradiation osteonecrosis given the proximity to the treated area in the left lung.  The possibility of a metastatic lesion with nondisplaced fracture is not entirely excluded, however, and close attention on followup studies is recommended. 3. Small partially loculated pericardial effusion which exerts slight mass effect upon the right heart. These findings are very similar to prior examinations. No pericardial calcification. 4. Aortic atherosclerosis, in addition to left main and 3 vessel coronary artery disease, with evidence of scarring from prior left circumflex coronary artery territory myocardial infarction, as above. Assessment for potential risk factor modification, dietary therapy or pharmacologic therapy may be warranted, if clinically indicated. 5. Additional incidental findings, as above. Electronically Signed By: Vinnie Langton M.D. On: 10/29/2015 16:54   Echo Study Conclusions 12/2014  - Left ventricle: The cavity size was normal. Wall thickness was increased in a pattern of moderate LVH. Systolic function was normal. The estimated ejection fraction was in the range of 60% to 65%. Wall motion was normal; there were no regional wall motion abnormalities. Doppler parameters are consistent with abnormal left ventricular relaxation (grade 1 diastolic dysfunction). The E/e&' ratio is >15, suggesting elevated LV filling pressure. - Aortic valve: Mildly calcified- there may be fusion of the non and right coronary cusps. There was mild regurgitation. - Mitral valve: Mildly thickened leaflets . There was mild regurgitation. - Left atrium: The atrium was normal in size. - Tricuspid valve: There was mild regurgitation. - Pulmonary arteries: PA peak pressure: 37 mm Hg (S). - Inferior vena cava: The vessel was normal in size. The respirophasic diameter changes were in the normal range (>= 50%), consistent with normal central venous pressure. - Pericardium, extracardiac: A trivial pericardial effusion was identified. Features  were not consistent with tamponade physiology.  Impressions:  - Compared to a prior echo in 2012, there are a few changes. LV filling pressures appear elevated. There is a persistent trivial pericardial effusion without tamponade features. RVSP is higher and there is now moderate LVH.   Assessment/Plan: 1. Cough  and shortness of breath - multifactorial - now followed by pulmonary. This is his most limiting factor. I suspect he has some degree of diastolic HF - will add some low dose Lasix. See back in 2 to 3 weeks.   2. CAD - no active chest pain noted. Would favor more conservative management.   3. Memory impairment - he has really declined since I last saw him 5 years ago.   4. Past pericardial effusion - s/p window - recent CT findings basically unchanged.   5. Prior lung cancers/melanoma - seeing Dr. Marin Olp in [place].  Current medicines are reviewed with the patient today.  The patient does not have concerns regarding medicines other than what has been noted above.  The following changes have been made:  See above.  Labs/ tests ordered today include:    Orders Placed This Encounter  Procedures  . Basic metabolic panel  . CBC  . EKG 12-Lead     Disposition:   FU with me in 2 to 3 weeks. Will check lab on return.    Patient is agreeable to this plan and will call if any problems develop in the interim.   Signed: Burtis Junes, RN, ANP-C 01/29/2016 10:57 AM  [name] Fields [address] [place], [place]  [address] Phone: [phone] Fax: [phone]

## 2016-02-05 ENCOUNTER — Encounter: Payer: Self-pay | Admitting: Nurse Practitioner

## 2016-02-19 ENCOUNTER — Encounter: Payer: Self-pay | Admitting: Nurse Practitioner

## 2016-02-19 ENCOUNTER — Ambulatory Visit (INDEPENDENT_AMBULATORY_CARE_PROVIDER_SITE_OTHER): Payer: Medicare Other | Admitting: Nurse Practitioner

## 2016-02-19 VITALS — BP 130/74 | HR 84 | Ht 70.0 in | Wt 196.1 lb

## 2016-02-19 DIAGNOSIS — E7800 Pure hypercholesterolemia, unspecified: Secondary | ICD-10-CM | POA: Diagnosis not present

## 2016-02-19 DIAGNOSIS — R609 Edema, unspecified: Secondary | ICD-10-CM

## 2016-02-19 DIAGNOSIS — R0609 Other forms of dyspnea: Secondary | ICD-10-CM

## 2016-02-19 DIAGNOSIS — J438 Other emphysema: Secondary | ICD-10-CM

## 2016-02-19 DIAGNOSIS — I2510 Atherosclerotic heart disease of native coronary artery without angina pectoris: Secondary | ICD-10-CM

## 2016-02-19 DIAGNOSIS — I2583 Coronary atherosclerosis due to lipid rich plaque: Secondary | ICD-10-CM

## 2016-02-19 DIAGNOSIS — R0600 Dyspnea, unspecified: Secondary | ICD-10-CM

## 2016-02-19 LAB — BASIC METABOLIC PANEL
BUN: 21 mg/dL (ref 7–25)
CO2: 28 mmol/L (ref 20–31)
Calcium: 9.4 mg/dL (ref 8.6–10.3)
Chloride: 102 mmol/L (ref 98–110)
Creat: 1 mg/dL (ref 0.70–1.18)
Glucose, Bld: 89 mg/dL (ref 65–99)
Potassium: 4.7 mmol/L (ref 3.5–5.3)
Sodium: 142 mmol/L (ref 135–146)

## 2016-02-19 NOTE — Patient Instructions (Addendum)
We will be checking the following labs today - BMET   Medication Instructions:    Continue with your current medicines.     Testing/Procedures To Be Arranged:  N/A  Follow-Up:   See me in 4 months    Other Special Instructions:   Go see the dermatologist about the place on your neck.      If you need a refill on your cardiac medications before your next appointment, please call your pharmacy.   Call the [place] office at [phone] if you have any questions, problems or concerns.

## 2016-02-19 NOTE — Progress Notes (Signed)
CARDIOLOGY OFFICE NOTE  Date:  02/19/2016    [name] Fields Date of Birth: 04-Mar-1935 Medical Record #[id number]  PCP:  [name], MD  Cardiologist:  [name]  Chief Complaint  Patient presents with  . Congestive Heart Failure    Follow up visit - seen for Dr. Marlou Porch    History of Present Illness: [name] Fields is a 81 y.o. male who presents today for a 3 week check. Seen for Dr. Marlou Porch. He is a former patient of Dr. Susa Simmonds that I cared for.   He has a history of coronary artery disease with prior lateral wall MI status post multiple stents in the past. Other issues include lung cancer in both sides (treated with radiation), melanoma, fibrosis, recurrent pleural effusion. He has had a pericardial window for an effusion back in 2011.   I saw him back in July - he had been referred back for shortness of breath. Productive cough - COPD - I sent him back to pulmonary for evaluation/treatment. Seen 3 weeks ago for his routine follow up. Did not understand how to use his Spiriva. Still short of breath. Some swelling - getting too much salt. I added some low dose Lasix.   Comes in today. Here alone. He says his swelling is better. Weight is down some. Has cut back on salt. No chest pain. Still with shortness of breath but this may be "a tad" better with the low dose Lasix. Not dizzy or lightheaded. He is more concerned today about a place on the side of his neck that will not heal - has been there for at least 6 months.   Past Medical History:  Diagnosis Date  . Anxiety    takes wellbutrin daily  . Arthritis    knees  . COPD (chronic obstructive pulmonary disease) ([place])   . Diverticulosis   . Emphysema of lung ([place])   . GERD (gastroesophageal reflux disease)   . Hearing loss   . Hx of radiation therapy 09/29/11;10/01/11;10/03/11;10/06/11;10/08/11;   RLLlung,50Gy/70f - SBRT  . Hyperlipidemia   . Hypertension   . Hypokinesia    MILD INFERIOR  . IHD  (ischemic heart disease)    Remote MI in 2001 with stent to the LCX  . Lactose intolerance   . Non-small cell carcinoma of lung ([facility]    Dx in October of 2010  . Normal nuclear stress test 2009   EF 63%. No ischemia. 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  . Pericardial effusion July 2011   s/p pericardial window; last echo in August 2012 with minimal effusion  . Pleural effusion, left   . SOB (shortness of breath)    Chronic with known fibrosis  . Status post chemotherapy 06/04/2009 -  09/06/2009    4 cycles of Cisplatin and Gemcitabine with Neulasta Support   . Vertigo    hx. of- ambulates with cane for safety.    Past Surgical History:  Procedure Laterality Date  . CARDIAC CATHETERIZATION  07/11/2003   EF 60%, REPEAT, WHICH SHOWED 30% PROXIMAL NARROWING IN THE RIGHT  CORONARY ARTERY  . CARDIOVASCULAR STRESS TEST  05/2007   EF 63%  . CATARACT EXTRACTION, BILATERAL Bilateral   . COLONOSCOPY WITH PROPOFOL N/A 01/30/2015   Procedure: COLONOSCOPY WITH PROPOFOL;  Surgeon: MGarlan Fair MD;  Location: [facility];  Service: Endoscopy;  Laterality: N/A;  . CORONARY STENT PLACEMENT  07/1999   STENT TO THE LEFT CIRCUMFLEX  . HERNIA REPAIR  as an adult- R inguinal   . PERICARDIAL WINDOW  July 2011  . TRANSTHORACIC ECHOCARDIOGRAM  02/22/2010   EF 55-60%     Medications: Current Outpatient Prescriptions  Medication Sig Dispense Refill  . aspirin 81 MG chewable tablet Chew 1 tablet (81 mg total) by mouth daily. 30 tablet 1  . atorvastatin (LIPITOR) 20 MG tablet TAKE 1 TABLET BY MOUTH DAILY 90 tablet 1  . B Complex Vitamins (VITAMIN B COMPLEX) TABS     . budesonide-formoterol (SYMBICORT) 160-4.5 MCG/ACT inhaler Inhale 2 puffs into the lungs 2 (two) times daily. 1 Inhaler 6  . buPROPion (WELLBUTRIN SR) 150 MG 12 hr tablet Take 150 mg by mouth daily.     . cyanocobalamin 500 MCG tablet Take 1 tablet (500 mcg total) by mouth daily. 30 tablet 0  . dutasteride (AVODART) 0.5 MG capsule Take 0.5 mg by  mouth daily.     . fluticasone furoate-vilanterol (BREO ELLIPTA) 200-25 MCG/INH AEPB 1 inhalation    . furosemide (LASIX) 20 MG tablet Take 1 tablet (20 mg total) by mouth daily. 30 tablet 6  . Linaclotide (LINZESS) 145 MCG CAPS capsule Take 145 mcg by mouth daily as needed (constipation).    . metoprolol tartrate (LOPRESSOR) 25 MG tablet TAKE 1/2 TABLET BY MOUTH TWICE DAILY 90 tablet 2  . Spacer/Aero-Holding Chambers (AEROCHAMBER Z-STAT PLUS CHAMBR) MISC Use as directed 1 each 0  . Tamsulosin HCl (FLOMAX) 0.4 MG CAPS Take 0.4 mg by mouth daily with supper.     . temazepam (RESTORIL) 30 MG capsule 1 capsule at bedtime    . Tiotropium Bromide Monohydrate (SPIRIVA RESPIMAT) 2.5 MCG/ACT AERS Inhale 2 puffs into the lungs daily. 1 Inhaler 3  . umeclidinium bromide (INCRUSE ELLIPTA) 62.5 MCG/INH AEPB Inhale 1 puff into the lungs daily. 30 each 3  . venlafaxine XR (EFFEXOR-XR) 150 MG 24 hr capsule Take 1 capsule by mouth daily.  1   No current facility-administered medications for this visit.     Allergies: Allergies  Allergen Reactions  . Sertraline Hcl Other (See Comments)    Patient does not remember the reaction    Social History: The patient  reports that he quit smoking about 21 years ago. His smoking use included Cigarettes. He has a 120.00 pack-year smoking history. He has never used smokeless tobacco. He reports that he does not drink alcohol or use drugs.   Family History: The patient's family history includes Lung cancer in his father; Stroke in his sister.   Review of Systems: Please see the history of present illness.   Otherwise, the review of systems is positive for none.   All other systems are reviewed and negative.   Physical Exam: VS:  BP 130/74   Pulse 84   Ht '5\' 10"'$  (1.778 m)   Wt 196 lb 1.9 oz (89 kg)   BMI 28.14 kg/m  .  BMI Body mass index is 28.14 kg/m.  Wt Readings from Last 3 Encounters:  02/19/16 196 lb 1.9 oz (89 kg)  01/29/16 198 lb 12.8 oz (90.2 kg)    12/18/15 195 lb 3.2 oz (88.5 kg)    General: Pleasant. Well developed, well nourished and in no acute distress. He is short of breath with activity. Weight is down 2 pounds.   HEENT: Normal.  Neck: Supple, no JVD, carotid bruits, or masses noted.  Cardiac: Regular rate and rhythm. No murmurs, rubs, or gallops. No edema.  Respiratory:  Lungs are coarse with scattered wheezing and with  normal work of breathing at rest.  GI: Soft and nontender.  MS: No deformity or atrophy. Gait and ROM intact.  Skin: Warm and dry. Color is normal.  Neuro:  Strength and sensation are intact and no gross focal deficits noted.  Psych: Alert, appropriate and with normal affect.   LABORATORY DATA:  EKG:  EKG is not ordered today.  Lab Results  Component Value Date   WBC 7.2 01/29/2016   HGB 14.3 01/29/2016   HCT 43.8 01/29/2016   PLT 90 (L) 01/29/2016   GLUCOSE 92 01/29/2016   ALT 13 12/03/2015   AST 20 12/03/2015   NA 141 01/29/2016   K 4.5 01/29/2016   CL 105 01/29/2016   CREATININE 0.95 01/29/2016   BUN 17 01/29/2016   CO2 30 01/29/2016   TSH 5.60 (H) 10/30/2015   INR 1.17 12/20/2014   HGBA1C 5.6 12/20/2014    BNP (last 3 results)  Recent Labs  10/30/15 1131  BNP 124.6*    ProBNP (last 3 results) No results for input(s): PROBNP in the last 8760 hours.   Other Studies Reviewed Today:  CXR IMPRESSION: Chronic volume loss with loculated effusions on the left. Scarring right base. Stable cardiac silhouette. No new opacity. Overall stable study. Electronically Signed By: Lowella Grip III M.D. On: 10/29/2015 16:13  CT CHEST  1. Today's study demonstrates stable postradiation changes of mass-like fibrosis both in the paramediastinal aspect of the left upper lobe and in the central infrahilar region of the right lower lobe. No definite findings to suggest local recurrence of disease or definite metastatic disease in the thorax. 2. There is a new nondisplaced fracture  of the posterolateral aspect of the left sixth rib. This may be pathologic secondary to postradiation osteonecrosis given the proximity to the treated area in the left lung. The possibility of a metastatic lesion with nondisplaced fracture is not entirely excluded, however, and close attention on followup studies is recommended. 3. Small partially loculated pericardial effusion which exerts slight mass effect upon the right heart. These findings are very similar to prior examinations. No pericardial calcification. 4. Aortic atherosclerosis, in addition to left main and 3 vessel coronary artery disease, with evidence of scarring from prior left circumflex coronary artery territory myocardial infarction, as above. Assessment for potential risk factor modification, dietary therapy or pharmacologic therapy may be warranted, if clinically indicated. 5. Additional incidental findings, as above. Electronically Signed By: Vinnie Langton M.D. On: 10/29/2015 16:54   Echo Study Conclusions 12/2014  - Left ventricle: The cavity size was normal. Wall thickness was increased in a pattern of moderate LVH. Systolic function was normal. The estimated ejection fraction was in the range of 60% to 65%. Wall motion was normal; there were no regional wall motion abnormalities. Doppler parameters are consistent with abnormal left ventricular relaxation (grade 1 diastolic dysfunction). The E/e&' ratio is >15, suggesting elevated LV filling pressure. - Aortic valve: Mildly calcified- there may be fusion of the non and right coronary cusps. There was mild regurgitation. - Mitral valve: Mildly thickened leaflets . There was mild regurgitation. - Left atrium: The atrium was normal in size. - Tricuspid valve: There was mild regurgitation. - Pulmonary arteries: PA peak pressure: 37 mm Hg (S). - Inferior vena cava: The vessel was normal in size. The respirophasic diameter changes  were in the normal range (>= 50%), consistent with normal central venous pressure. - Pericardium, extracardiac: A trivial pericardial effusion was identified. Features were not consistent with tamponade physiology.  Impressions:  -  Compared to a prior echo in 2012, there are a few changes. LV filling pressures appear elevated. There is a persistent trivial pericardial effusion without tamponade features. RVSP is higher and there is now moderate LVH.   Assessment/Plan: 1. Cough and shortness of breath - multifactorial - now followed by pulmonary. This is his most limiting factor. Has some degree of diastolic HF - now on some low dose Lasix - weight is down a few pounds - needs repeat BMET today - I have left him on his current regimen.   2. CAD - no active chest pain noted. Would favor more conservative management.   3. Memory impairment - he has really declined since I last saw him 5 years ago.   4. Past pericardial effusion - s/p window - recent CT findings basically unchanged.   5. Prior lung cancers/melanoma - seeing Dr. Marin Olp in [place].  6. Skin disorder - suggested he see dermatology.  Current medicines are reviewed with the patient today.  The patient does not have concerns regarding medicines other than what has been noted above.  The following changes have been made:  See above.  Labs/ tests ordered today include:    Orders Placed This Encounter  Procedures  . Basic metabolic panel     Disposition:   FU with me in 3 to 4 months.    Patient is agreeable to this plan and will call if any problems develop in the interim.   Signed: Burtis Junes, RN, ANP-C 02/19/2016 10:59 AM  [facility] [address] [place], [place]  [address] Phone: [phone] Fax: [phone]

## 2016-02-25 ENCOUNTER — Other Ambulatory Visit (HOSPITAL_BASED_OUTPATIENT_CLINIC_OR_DEPARTMENT_OTHER): Payer: Medicare Other

## 2016-02-25 ENCOUNTER — Ambulatory Visit (HOSPITAL_BASED_OUTPATIENT_CLINIC_OR_DEPARTMENT_OTHER): Payer: Medicare Other

## 2016-02-25 ENCOUNTER — Ambulatory Visit (HOSPITAL_BASED_OUTPATIENT_CLINIC_OR_DEPARTMENT_OTHER): Payer: Medicare Other | Admitting: Hematology & Oncology

## 2016-02-25 VITALS — BP 123/58 | HR 63 | Temp 97.9°F | Resp 16 | Ht 70.0 in | Wt 198.0 lb

## 2016-02-25 DIAGNOSIS — Z85118 Personal history of other malignant neoplasm of bronchus and lung: Secondary | ICD-10-CM

## 2016-02-25 DIAGNOSIS — J4541 Moderate persistent asthma with (acute) exacerbation: Secondary | ICD-10-CM

## 2016-02-25 DIAGNOSIS — D696 Thrombocytopenia, unspecified: Secondary | ICD-10-CM | POA: Diagnosis not present

## 2016-02-25 DIAGNOSIS — C3430 Malignant neoplasm of lower lobe, unspecified bronchus or lung: Secondary | ICD-10-CM

## 2016-02-25 DIAGNOSIS — C44519 Basal cell carcinoma of skin of other part of trunk: Secondary | ICD-10-CM

## 2016-02-25 LAB — COMPREHENSIVE METABOLIC PANEL
ALT: 26 U/L (ref 0–55)
ANION GAP: 9 meq/L (ref 3–11)
AST: 34 U/L (ref 5–34)
Albumin: 3.4 g/dL — ABNORMAL LOW (ref 3.5–5.0)
Alkaline Phosphatase: 138 U/L (ref 40–150)
BUN: 16.7 mg/dL (ref 7.0–26.0)
CALCIUM: 9.2 mg/dL (ref 8.4–10.4)
CHLORIDE: 106 meq/L (ref 98–109)
CO2: 26 meq/L (ref 22–29)
CREATININE: 1 mg/dL (ref 0.7–1.3)
EGFR: 72 mL/min/{1.73_m2} — AB (ref >90)
Glucose: 104 mg/dl (ref 70–140)
POTASSIUM: 4 meq/L (ref 3.5–5.1)
Sodium: 141 mEq/L (ref 136–145)
Total Bilirubin: 0.97 mg/dL (ref 0.20–1.20)
Total Protein: 7 g/dL (ref 6.4–8.3)

## 2016-02-25 LAB — CBC WITH DIFFERENTIAL (CANCER CENTER ONLY)
BASO#: 0.1 10*3/uL (ref 0.0–0.2)
BASO%: 1.4 % (ref 0.0–2.0)
EOS%: 5.5 % (ref 0.0–7.0)
Eosinophils Absolute: 0.4 10*3/uL (ref 0.0–0.5)
HCT: 42.3 % (ref 38.7–49.9)
HGB: 14.1 g/dL (ref 13.0–17.1)
LYMPH#: 1.1 10*3/uL (ref 0.9–3.3)
LYMPH%: 14.6 % (ref 14.0–48.0)
MCH: 29.9 pg (ref 28.0–33.4)
MCHC: 33.3 g/dL (ref 32.0–35.9)
MCV: 90 fL (ref 82–98)
MONO#: 0.8 10*3/uL (ref 0.1–0.9)
MONO%: 10.2 % (ref 0.0–13.0)
NEUT#: 5.3 10*3/uL (ref 1.5–6.5)
NEUT%: 68.3 % (ref 40.0–80.0)
PLATELETS: 87 10*3/uL — AB (ref 145–400)
RBC: 4.71 10*6/uL (ref 4.20–5.70)
RDW: 14.5 % (ref 11.1–15.7)
WBC: 7.8 10*3/uL (ref 4.0–10.0)

## 2016-02-25 LAB — LACTATE DEHYDROGENASE: LDH: 204 U/L (ref 125–245)

## 2016-02-25 MED ORDER — HEPARIN SOD (PORK) LOCK FLUSH 100 UNIT/ML IV SOLN
500.0000 [IU] | Freq: Once | INTRAVENOUS | Status: AC
  Administered 2016-02-25: 500 [IU] via INTRAVENOUS
  Filled 2016-02-25: qty 5

## 2016-02-25 MED ORDER — SODIUM CHLORIDE 0.9% FLUSH
10.0000 mL | INTRAVENOUS | Status: DC | PRN
  Administered 2016-02-25: 10 mL via INTRAVENOUS
  Filled 2016-02-25: qty 10

## 2016-02-25 NOTE — Patient Instructions (Signed)

## 2016-02-25 NOTE — Progress Notes (Signed)
Hematology and Oncology Follow Up Visit  [name] Fields [id number] 1934-09-14 81 y.o. 02/25/2016   Principle Diagnosis:  1. 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 He feels okay. He is having some lung issues. Does see a pulmonologist.  He has not noted any problems with pain. He's had no increased cough or shortness of breath .  He has had no change in bowel or bladder habits.   He has had no rashes. He has had no fever. He has had no recent infections or viral illnesses.  He's not noted any bleeding or bruising. He's had no weight loss or weight gain.   He will have a quiet Thanksgiving with his family.   Overall, his performance status is ECOG 1.  Medications:    Medication List       Accurate as of 02/25/16 10:13 AM. Always use your most recent med list.          AEROCHAMBER Z-STAT PLUS CHAMBR Misc Use as directed   aspirin 81 MG chewable tablet Chew 1 tablet (81 mg total) by mouth daily.   atorvastatin 20 MG tablet Commonly known as:  LIPITOR TAKE 1 TABLET BY MOUTH DAILY   BREO ELLIPTA 200-25 MCG/INH Aepb Generic drug:  fluticasone furoate-vilanterol 1 inhalation   budesonide-formoterol 160-4.5 MCG/ACT inhaler Commonly known as:  SYMBICORT Inhale 2 puffs into the lungs 2 (two) times daily.   buPROPion 150 MG 12 hr tablet Commonly known as:  WELLBUTRIN SR Take 150 mg by mouth daily.   cyanocobalamin 500 MCG tablet Take 1 tablet (500 mcg total) by mouth daily.   dutasteride 0.5 MG capsule Commonly known as:  AVODART Take 0.5 mg by mouth daily.   furosemide 20 MG tablet Commonly known as:  LASIX Take 1 tablet (20 mg total) by mouth daily.   LINZESS 145 MCG Caps capsule Generic drug:   linaclotide Take 145 mcg by mouth daily as needed (constipation).   metoprolol tartrate 25 MG tablet Commonly known as:  LOPRESSOR TAKE 1/2 TABLET BY MOUTH TWICE DAILY   tamsulosin 0.4 MG Caps capsule Commonly known as:  FLOMAX Take 0.4 mg by mouth daily with supper.   temazepam 30 MG capsule Commonly known as:  RESTORIL 1 capsule at bedtime   Tiotropium Bromide Monohydrate 2.5 MCG/ACT Aers Commonly known as:  SPIRIVA RESPIMAT Inhale 2 puffs into the lungs daily.   umeclidinium bromide 62.5 MCG/INH Aepb Commonly known as:  INCRUSE ELLIPTA Inhale 1 puff into the lungs daily.   venlafaxine XR 150 MG 24 hr capsule Commonly known as:  EFFEXOR-XR Take 1 capsule by mouth daily.   Vitamin B Complex Tabs       Allergies:  Allergies  Allergen Reactions  . Sertraline Hcl Other (See Comments)    Patient does not remember the reaction    Past Medical History, Surgical history, Social history, and Family History were reviewed and updated.  Review of Systems: All other 10 point review of systems is negative.   Physical Exam:  vitals were not taken for this visit.  Wt Readings from Last 3 Encounters:  02/25/16 198 lb (89.8 kg)  02/19/16 196 lb 1.9 oz (89 kg)  01/29/16 198 lb 12.8 oz (90.2 kg)  Well-developed and well-nourished elderly male. Head and neck exam shows no ocular or oral lesions. He has no adenopathy in the neck. Lungs show wheezing bilaterally. He has decent air movement bilaterally. No rhonchi are noted. Cardiac exam regular rate and rhythm with no murmurs, rubs or bruits. Abdomen is soft. He has decent bowel sounds. There is no fluid wave. There is no palpable liver or spleen tip. Back exam shows no tenderness over the spine, ribs or hips. Externally shows no clubbing, cyanosis or edema or skin exam shows some scattered actinic keratoses. I do not see any squamous or basal cell cancers. He does have some chronic sun damage.  Lab Results  Component Value Date    WBC 7.8 02/25/2016   HGB 14.1 02/25/2016   HCT 42.3 02/25/2016   MCV 90 02/25/2016   PLT 87 (L) 02/25/2016   Lab Results  Component Value Date   FERRITIN 92 12/20/2014   IRON 95 12/20/2014   TIBC 405 12/20/2014   UIBC 310 12/20/2014   IRONPCTSAT 23 12/20/2014   Lab Results  Component Value Date   RETICCTPCT 1.8 12/20/2014   RBC 4.71 02/25/2016   Lab Results  Component Value Date   KAPLAMBRATIO 1.98 (L) 12/20/2014   No results found for: Kandis Cocking, IGMSERUM Lab Results  Component Value Date   TOTALPROTELP 6.9 12/20/2014   ALBUMINELP 3.4 12/20/2014   A1GS 0.3 12/20/2014   A2GS 0.9 12/20/2014   BETS 1.3 12/20/2014   GAMS 1.1 12/20/2014   MSPIKE Not Observed 12/20/2014   SPEI Comment 12/20/2014     Chemistry      Component Value Date/Time   NA 142 02/19/2016 1114   NA 141 12/03/2015 0836   K 4.7 02/19/2016 1114   K 4.4 12/03/2015 0836   CL 102 02/19/2016 1114   CL 104 12/28/2014 1349   CL 104 08/27/2012 1421   CO2 28 02/19/2016 1114   CO2 27 12/03/2015 0836   BUN 21 02/19/2016 1114   BUN 19.6 12/03/2015 0836   CREATININE 1.00 02/19/2016 1114   CREATININE 1.0 12/03/2015 0836      Component Value Date/Time   CALCIUM 9.4 02/19/2016 1114   CALCIUM 9.9 12/03/2015 0836   ALKPHOS 121 12/03/2015 0836   AST 20 12/03/2015 0836   ALT 13 12/03/2015 0836   BILITOT 0.97 12/03/2015 0836     Impression and Plan: [name] Fields is 81 year old white male history of lung cancer and melanoma. His most recent malignancy was stage I lung cancer diagnosed in April 2013 and treated with stereotactic radiosurgery in July 2013. So far, he has done well and there has been no evidence of recurrence.    I am quite surprised that his platelet count is low. I'm not sure as to why this would be. He is not on any new medications. He's had no recent illnesses.   We will have to watch this closely. I want him back in one month. I told him to call us if he has any issues with bleeding  or bruising.   I spent about 25 minutes with him today.  Volanda Napoleon, MD 11/20/201710:13 AM

## 2016-03-17 ENCOUNTER — Other Ambulatory Visit: Payer: Self-pay | Admitting: Dermatology

## 2016-03-17 DIAGNOSIS — C4441 Basal cell carcinoma of skin of scalp and neck: Secondary | ICD-10-CM | POA: Diagnosis not present

## 2016-03-17 DIAGNOSIS — Z85820 Personal history of malignant melanoma of skin: Secondary | ICD-10-CM | POA: Diagnosis not present

## 2016-03-17 DIAGNOSIS — L570 Actinic keratosis: Secondary | ICD-10-CM | POA: Diagnosis not present

## 2016-03-17 DIAGNOSIS — D229 Melanocytic nevi, unspecified: Secondary | ICD-10-CM | POA: Diagnosis not present

## 2016-03-20 ENCOUNTER — Ambulatory Visit (INDEPENDENT_AMBULATORY_CARE_PROVIDER_SITE_OTHER): Payer: Medicare Other | Admitting: Pulmonary Disease

## 2016-03-20 ENCOUNTER — Encounter: Payer: Self-pay | Admitting: Pulmonary Disease

## 2016-03-20 VITALS — BP 132/78 | HR 70 | Ht 70.0 in | Wt 195.0 lb

## 2016-03-20 DIAGNOSIS — C3490 Malignant neoplasm of unspecified part of unspecified bronchus or lung: Secondary | ICD-10-CM

## 2016-03-20 DIAGNOSIS — J449 Chronic obstructive pulmonary disease, unspecified: Secondary | ICD-10-CM

## 2016-03-20 DIAGNOSIS — J984 Other disorders of lung: Secondary | ICD-10-CM

## 2016-03-20 DIAGNOSIS — E8801 Alpha-1-antitrypsin deficiency: Secondary | ICD-10-CM

## 2016-03-20 DIAGNOSIS — Z148 Genetic carrier of other disease: Secondary | ICD-10-CM | POA: Insufficient documentation

## 2016-03-20 DIAGNOSIS — J439 Emphysema, unspecified: Secondary | ICD-10-CM | POA: Diagnosis not present

## 2016-03-20 DIAGNOSIS — I2583 Coronary atherosclerosis due to lipid rich plaque: Secondary | ICD-10-CM | POA: Diagnosis not present

## 2016-03-20 DIAGNOSIS — R0602 Shortness of breath: Secondary | ICD-10-CM

## 2016-03-20 DIAGNOSIS — I2510 Atherosclerotic heart disease of native coronary artery without angina pectoris: Secondary | ICD-10-CM | POA: Diagnosis not present

## 2016-03-20 LAB — PULMONARY FUNCTION TEST
FEF 25-75 POST: 0.83 L/s
FEF 25-75 Pre: 0.65 L/sec
FEF2575-%CHANGE-POST: 27 %
FEF2575-%PRED-POST: 43 %
FEF2575-%Pred-Pre: 33 %
FEV1-%CHANGE-POST: 8 %
FEV1-%Pred-Post: 45 %
FEV1-%Pred-Pre: 41 %
FEV1-POST: 1.26 L
FEV1-Pre: 1.16 L
FEV1FVC-%CHANGE-POST: 5 %
FEV1FVC-%PRED-PRE: 89 %
FEV6-%Change-Post: 3 %
FEV6-%Pred-Post: 50 %
FEV6-%Pred-Pre: 48 %
FEV6-Post: 1.83 L
FEV6-Pre: 1.77 L
FEV6FVC-%Change-Post: 1 %
FEV6FVC-%Pred-Post: 107 %
FEV6FVC-%Pred-Pre: 105 %
FVC-%Change-Post: 2 %
FVC-%PRED-POST: 47 %
FVC-%PRED-PRE: 46 %
FVC-POST: 1.85 L
FVC-PRE: 1.81 L
POST FEV1/FVC RATIO: 68 %
PRE FEV6/FVC RATIO: 98 %
Post FEV6/FVC ratio: 100 %
Pre FEV1/FVC ratio: 64 %

## 2016-03-20 NOTE — Progress Notes (Signed)
Test reviewed.  

## 2016-03-20 NOTE — Progress Notes (Signed)
Subjective:    Patient ID: [name] Fields, male    DOB: November 09, 1934, 81 y.o.   MRN: [id number]  C.C.:  Follow-up for Severe COPD w/ Emphysema, Moderate Restrictive Lung Disease, & NSCLC.  HPI  Severe COPD w/ Emphysema:  Switched from Breo to Spiriva Respimat and Symbicort 160/4.5 at last appointment. Carrier for alpha-1 antitrypsin with MS. Reports his dyspnea is unchanged. He reports he continues to have a nonproductive cough and wheezing that is unchanged. Reports he does wake up at night coughing & wheezing at times.   Moderate Restrictive Lung Disease:  Likely secondary to postradiation effects on left lung.  NSCLC:  Follows with Dr. Marin Olp.  Review of Systems No chest pain or pressure. No fever, chills, or sweats. No abdominal pain or nausea. No dysphagia or odynophagia. No morning brash water taste or reflux.   Allergies  Allergen Reactions  . Sertraline Hcl Other (See Comments)    Patient does not remember the reaction    Current Outpatient Prescriptions on File Prior to Visit  Medication Sig Dispense Refill  . aspirin EC 81 MG tablet Take 81 mg by mouth daily.    Marland Kitchen atorvastatin (LIPITOR) 20 MG tablet TAKE 1 TABLET BY MOUTH DAILY 90 tablet 1  . B Complex Vitamins (VITAMIN B COMPLEX) TABS     . budesonide-formoterol (SYMBICORT) 160-4.5 MCG/ACT inhaler Inhale 2 puffs into the lungs 2 (two) times daily. 1 Inhaler 6  . buPROPion (WELLBUTRIN SR) 150 MG 12 hr tablet Take 150 mg by mouth daily.     . cyanocobalamin 500 MCG tablet Take 1 tablet (500 mcg total) by mouth daily. 30 tablet 0  . dutasteride (AVODART) 0.5 MG capsule Take 0.5 mg by mouth daily.     . fluticasone furoate-vilanterol (BREO ELLIPTA) 200-25 MCG/INH AEPB 1 inhalation    . furosemide (LASIX) 20 MG tablet Take 1 tablet (20 mg total) by mouth daily. 30 tablet 6  . Linaclotide (LINZESS) 145 MCG CAPS capsule Take 145 mcg by mouth daily as needed (constipation).    . metoprolol tartrate (LOPRESSOR) 25 MG tablet  TAKE 1/2 TABLET BY MOUTH TWICE DAILY 90 tablet 2  . Spacer/Aero-Holding Chambers (AEROCHAMBER Z-STAT PLUS CHAMBR) MISC Use as directed 1 each 0  . Tamsulosin HCl (FLOMAX) 0.4 MG CAPS Take 0.4 mg by mouth daily with supper.     . temazepam (RESTORIL) 30 MG capsule 1 capsule at bedtime    . Tiotropium Bromide Monohydrate (SPIRIVA RESPIMAT) 2.5 MCG/ACT AERS Inhale 2 puffs into the lungs daily. 1 Inhaler 3  . venlafaxine XR (EFFEXOR-XR) 150 MG 24 hr capsule Take 1 capsule by mouth daily.  1   No current facility-administered medications on file prior to visit.     Past Medical History:  Diagnosis Date  . Anxiety    takes wellbutrin daily  . Arthritis    knees  . COPD (chronic obstructive pulmonary disease) ([place])   . Diverticulosis   . Emphysema of lung ([place])   . GERD (gastroesophageal reflux disease)   . Hearing loss   . Hx of radiation therapy 09/29/11;10/01/11;10/03/11;10/06/11;10/08/11;   RLLlung,50Gy/53f - SBRT  . Hyperlipidemia   . Hypertension   . Hypokinesia    MILD INFERIOR  . IHD (ischemic heart disease)    Remote MI in 2001 with stent to the LCX  . Lactose intolerance   . [place]    Dx in October of 2010  . Normal nuclear stress test 2009  EF 63%. No ischemia. 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  . Pericardial effusion July 2011   s/p pericardial window; last echo in August 2012 with minimal effusion  . Pleural effusion, left   . SOB (shortness of breath)    Chronic with known fibrosis  . Status post chemotherapy 06/04/2009 -  09/06/2009    4 cycles of Cisplatin and Gemcitabine with Neulasta Support   . Vertigo    hx. of- ambulates with cane for safety.    Past Surgical History:  Procedure Laterality Date  . CARDIAC CATHETERIZATION  07/11/2003   EF 60%, REPEAT, WHICH SHOWED 30% PROXIMAL NARROWING IN THE RIGHT  CORONARY ARTERY  . CARDIOVASCULAR STRESS TEST  05/2007   EF 63%  . CATARACT EXTRACTION, BILATERAL Bilateral   . COLONOSCOPY WITH PROPOFOL N/A  01/30/2015   Procedure: COLONOSCOPY WITH PROPOFOL;  Surgeon: Garlan Fair, MD;  Location: [facility];  Service: Endoscopy;  Laterality: N/A;  . CORONARY STENT PLACEMENT  07/1999   STENT TO THE LEFT CIRCUMFLEX  . HERNIA REPAIR     as an adult- R inguinal   . PERICARDIAL WINDOW  July 2011  . TRANSTHORACIC ECHOCARDIOGRAM  02/22/2010   EF 55-60%    Family History  Problem Relation Age of Onset  . Lung cancer Father   . Stroke Sister   . Heart disease Neg Hx   . Anesthesia problems Neg Hx   . Lung disease Neg Hx     Social History   Social History  . Marital status: Single    Spouse name: N/A  . Number of children: N/A  . Years of education: N/A   Occupational History  . retired Retired    [employer]   Social History Main Topics  . Smoking status: Former Smoker    Packs/day: 3.00    Years: 40.00    Types: Cigarettes    Quit date: 04/07/1994  . Smokeless tobacco: Never Used  . Alcohol use No  . Drug use: No  . Sexual activity: No   Other Topics Concern  . None   Social History Narrative   Originally from [place]. Previously worked for [employer]. Currently has 2 dogs. No bird or mold exposure.       Objective:   Physical Exam BP 132/78 (BP Location: Right Arm, Cuff Size: Normal)   Pulse 70   Ht '5\' 10"'$  (1.778 m)   Wt 195 lb (88.5 kg)   SpO2 96%   BMI 27.98 kg/m  General:  Awake. Alert. No distress.  Integument:  Warm & dry. No rash on exposed skin.  Lymphatics:  No appreciated cervical or supraclavicular lymphadenoapthy. HEENT:  Moist mucus membranes. No oral ulcers. No nasal turbinate swelling. Cardiovascular:  Regular rate. No edema. Normal S1 & S2. Pulmonary:  Normal work of breathing on room air. Very minimal and transient end expiratory wheeze. Good aeration bilaterally. Abdomen: Soft. Normal bowel sounds. Protuberant.   PFT 03/20/16: FVC 1.81 L (46%) FEV1 1.16 L (41%) FEV1/FVC 0.64 FEF 25-75 0.65 L (33%) negative bronchodilator response 11/05/15: FVC 1.86 L (47%)  FEV1 1.22 L (43%) FEV1/FVC 0.66 FEF 25-75 0.73 L (37%) negative bronchodilator response TLC 4.01 L (88%) RV 86% ERV 33% DLCO uncorrected 32%  6MWT 03/20/16:  Walked 218 meters / Baseline Sat 97% on RA / Nadir Sat 97% on RA (paused for 15 seconds)  IMAGING CTA CHEST 10/29/15 (previously reviewed by me): Heart normal in size. Loculated pericardial effusion noted. Right internal jugular Port-A-Cath noted. No pathologic mediastinal  adenopathy. Paramediastinal opacity/mass in left upper lobe that is chronic. Atelectasis of left upper lobe is also appreciated along with pleural thickening & volume loss in the left lung. Small subpleural nodules noted bilaterally that are all subcentimeter. No obvious progression. Diffuse centrilobular & paraseptal emphysema.  CARDIAC TTE (12/20/14): Moderate LVH with EF 60-65%. No regional wall motion abnormalities. Grade 1 diastolic dysfunction. LA & RA normal in size. RV normal in size and function. Pulmonary artery systolic pressure 37 mmHg. Mild aortic regurgitation. No aortic dilatation. Mild mitral regurgitation. Trivial pulmonic regurgitation. Mild tricuspid regurgitation. Trivial pericardial effusion.  LABS 12/18/15 Alpha-1 antitrypsin: MS (136)  12/03/15 CBC: 8.2/15.7/47.3/251 BMP: 141/4.4/104/27/19.6/1.0/92/9.9 LFT: 2.9/7.9/0.97/121/20/13 LDH:  176  10/30/15 BNP:  124.6  10/26/09 ABG on 2 L/m:  7.38 / 45 / 75 / 95%    Assessment & Plan:  81 y.o. with Severe COPD w/ Emphysema, Moderate Restrictive Lung Disease & h/o NSCLC.Patient's spirometry today has not significant change since previous testing. He still lacks a significant bronchodilator response and I doubt that the transition to Symbicort and Spiriva has had any meaningful effect on his pulmonary function. As such its results will to consider treatment with Spiriva alone. He may be having some silent laryngo-esophageal reflux and I will attempt empiric treatment of this to see if we can affect his  cough. We did discuss his alpha-1 antitrypsin carrier status as well as the need for biological relative testing. I instructed the patient to contact my office if he had any new breathing problems or questions before his next appointment.  1. Severe COPD w/ Emphysema: Continuing patient on Spiriva.Trying patient off of Symbicort for a short period to see if this helps cough. Also trying empiric treatment of reflux with Zantac daily at bedtime to see if this affects cough. 2. Alpha-1 antitrypsin carrier: Patient counseled to have biological relatives tested given his carrier status. 3. Moderate Restrictive Lung Disease: No further testing at this time. 4. NSCLC:  Follows with Dr. Marin Olp. 5. Health Maintenance:  S/P Pneumovax 27 January 2010 & Prevnar 19 December 2015. Reports he did have the Influenza Vaccine.  6. Follow-up:  Return to clinic in 3 months or sooner if needed.   Sonia Baller Ashok Cordia, M.D. [facility] Pager:  [phone] After 3pm or if no response, call [phone] 2:00 PM 03/20/16

## 2016-03-20 NOTE — Patient Instructions (Addendum)
   Call me if you have any new breathing problems before your next appointment.  Try taking Zantac (Generic is Ranitidine) '150mg'$  at night before bed for a couple of weeks to see if it helps your cough.  Try stopping the Symbicort inhaler but continue taking your Spiriva Respimat to see if this affects your cough or breathing. If you don't notice a difference just continue taking only the Spiriva until I see you back.  Remember to have your blood relatives tested for the genetic form of COPD & Emphysema called Alpha-1 Antitrypsin Deficiency.   I will see you back in 3 months or sooner if needed.

## 2016-04-01 ENCOUNTER — Ambulatory Visit (HOSPITAL_BASED_OUTPATIENT_CLINIC_OR_DEPARTMENT_OTHER): Payer: Medicare Other

## 2016-04-01 ENCOUNTER — Other Ambulatory Visit (HOSPITAL_BASED_OUTPATIENT_CLINIC_OR_DEPARTMENT_OTHER): Payer: Medicare Other

## 2016-04-01 ENCOUNTER — Ambulatory Visit (HOSPITAL_BASED_OUTPATIENT_CLINIC_OR_DEPARTMENT_OTHER): Payer: Medicare Other | Admitting: Hematology & Oncology

## 2016-04-01 VITALS — BP 113/71 | HR 70 | Temp 97.4°F | Resp 18 | Wt 199.0 lb

## 2016-04-01 DIAGNOSIS — I2510 Atherosclerotic heart disease of native coronary artery without angina pectoris: Secondary | ICD-10-CM | POA: Diagnosis not present

## 2016-04-01 DIAGNOSIS — I2583 Coronary atherosclerosis due to lipid rich plaque: Secondary | ICD-10-CM

## 2016-04-01 DIAGNOSIS — Z85820 Personal history of malignant melanoma of skin: Secondary | ICD-10-CM | POA: Diagnosis not present

## 2016-04-01 DIAGNOSIS — Z85118 Personal history of other malignant neoplasm of bronchus and lung: Secondary | ICD-10-CM

## 2016-04-01 DIAGNOSIS — C3431 Malignant neoplasm of lower lobe, right bronchus or lung: Secondary | ICD-10-CM

## 2016-04-01 DIAGNOSIS — D696 Thrombocytopenia, unspecified: Secondary | ICD-10-CM

## 2016-04-01 DIAGNOSIS — C3430 Malignant neoplasm of lower lobe, unspecified bronchus or lung: Secondary | ICD-10-CM

## 2016-04-01 DIAGNOSIS — Z95828 Presence of other vascular implants and grafts: Secondary | ICD-10-CM

## 2016-04-01 LAB — CBC WITH DIFFERENTIAL (CANCER CENTER ONLY)
BASO#: 0.1 10*3/uL (ref 0.0–0.2)
BASO%: 0.8 % (ref 0.0–2.0)
EOS ABS: 0.5 10*3/uL (ref 0.0–0.5)
EOS%: 5.9 % (ref 0.0–7.0)
HCT: 42 % (ref 38.7–49.9)
HEMOGLOBIN: 13.9 g/dL (ref 13.0–17.1)
LYMPH#: 1.6 10*3/uL (ref 0.9–3.3)
LYMPH%: 19.1 % (ref 14.0–48.0)
MCH: 30.3 pg (ref 28.0–33.4)
MCHC: 33.1 g/dL (ref 32.0–35.9)
MCV: 92 fL (ref 82–98)
MONO#: 0.8 10*3/uL (ref 0.1–0.9)
MONO%: 9.5 % (ref 0.0–13.0)
NEUT%: 64.7 % (ref 40.0–80.0)
NEUTROS ABS: 5.4 10*3/uL (ref 1.5–6.5)
Platelets: 135 10*3/uL — ABNORMAL LOW (ref 145–400)
RBC: 4.58 10*6/uL (ref 4.20–5.70)
RDW: 14.2 % (ref 11.1–15.7)
WBC: 8.3 10*3/uL (ref 4.0–10.0)

## 2016-04-01 LAB — CHCC SATELLITE - SMEAR

## 2016-04-01 MED ORDER — SODIUM CHLORIDE 0.9% FLUSH
10.0000 mL | INTRAVENOUS | Status: DC | PRN
  Administered 2016-04-01: 10 mL via INTRAVENOUS
  Filled 2016-04-01: qty 10

## 2016-04-01 MED ORDER — HEPARIN SOD (PORK) LOCK FLUSH 100 UNIT/ML IV SOLN
500.0000 [IU] | Freq: Once | INTRAVENOUS | Status: AC
  Administered 2016-04-01: 500 [IU] via INTRAVENOUS
  Filled 2016-04-01: qty 5

## 2016-04-01 NOTE — Patient Instructions (Signed)

## 2016-04-01 NOTE — Progress Notes (Signed)
Hematology and Oncology Follow Up Visit  [name] Fields [id number] 1934/10/18 81 y.o. 04/01/2016   Principle Diagnosis:  1. 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 He feels okay. He has been seen by his dermatologist. He had a basal cell cancer removed from the upper right neck. He has some other skin lesions probably need to be removed.   We last saw him, his platelet count was unexpectedly on the low side. It was 87,000. It has come back up which is nice to see.   He had a very nice Christmas. He took his family out to [employer]. They really had a good time. He enjoyed himself. He was happy that he can do this for his family.  He has had no increase in lung issues. He does see his pulmonologist. He's had no increased shortness of breath. He's had no cough.   Overall, his performance status is ECOG 1.  Medications:  Allergies as of 04/01/2016      Reactions   Sertraline Hcl Other (See Comments)   Patient does not remember the reaction      Medication List       Accurate as of 04/01/16 10:53 AM. Always use your most recent med list.          AEROCHAMBER Z-STAT PLUS CHAMBR Misc Use as directed   aspirin EC 81 MG tablet Take 81 mg by mouth daily.   atorvastatin 20 MG tablet Commonly known as:  LIPITOR TAKE 1 TABLET BY MOUTH DAILY   BREO ELLIPTA 200-25 MCG/INH Aepb Generic drug:  fluticasone furoate-vilanterol 1 inhalation   budesonide-formoterol 160-4.5 MCG/ACT inhaler Commonly known as:  SYMBICORT Inhale 2 puffs into the lungs 2 (two) times daily.   buPROPion 150 MG 12 hr tablet Commonly known as:  WELLBUTRIN SR Take 150 mg by mouth daily.   cyanocobalamin 500 MCG tablet Take 1 tablet (500 mcg  total) by mouth daily.   dutasteride 0.5 MG capsule Commonly known as:  AVODART Take 0.5 mg by mouth daily.   furosemide 20 MG tablet Commonly known as:  LASIX Take 1 tablet (20 mg total) by mouth daily.   LINZESS 145 MCG Caps capsule Generic drug:  linaclotide Take 145 mcg by mouth daily as needed (constipation).   metoprolol tartrate 25 MG tablet Commonly known as:  LOPRESSOR TAKE 1/2 TABLET BY MOUTH TWICE DAILY   tamsulosin 0.4 MG Caps capsule Commonly known as:  FLOMAX Take 0.4 mg by mouth daily with supper.   temazepam 30 MG capsule Commonly known as:  RESTORIL 1 capsule at bedtime   Tiotropium Bromide Monohydrate 2.5 MCG/ACT Aers Commonly known as:  SPIRIVA RESPIMAT Inhale 2 puffs into the lungs daily.   venlafaxine XR 150 MG 24 hr capsule Commonly known as:  EFFEXOR-XR Take 1 capsule by mouth daily.   Vitamin B Complex Tabs       Allergies:  Allergies  Allergen Reactions  . Sertraline Hcl Other (See Comments)    Patient does not remember the reaction    Past Medical History, Surgical history, Social history, and Family History were reviewed and updated.  Review of Systems: All other 10 point review of systems is negative.   Physical Exam:  weight is 199 lb (90.3 kg). His oral temperature is 97.4 F (36.3 C). His blood pressure is 113/71 and his pulse is 70. His respiration is 18.   Wt Readings from Last 3 Encounters:  04/01/16 199 lb (90.3 kg)  03/20/16 195 lb (88.5 kg)  02/25/16 198 lb (89.8 kg)    Well-developed and well-nourished elderly male. Head and neck exam shows no ocular or oral lesions. He has no adenopathy in the neck. Lungs show wheezing bilaterally. He has decent air movement bilaterally. No rhonchi are noted. Cardiac exam regular rate and rhythm with no murmurs, rubs or bruits. Abdomen is soft. He has decent bowel sounds. There is no fluid wave. There is no palpable liver or spleen tip. Back exam shows no tenderness over the spine,  ribs or hips. Externally shows no clubbing, cyanosis or edema or skin exam shows some scattered actinic keratoses. I do not see any squamous or basal cell cancers. He does have some chronic sun damage.  Lab Results  Component Value Date   WBC 8.3 04/01/2016   HGB 13.9 04/01/2016   HCT 42.0 04/01/2016   MCV 92 04/01/2016   PLT 135 Platelet count confirmed by slide estimate (L) 04/01/2016   Lab Results  Component Value Date   FERRITIN 92 12/20/2014   IRON 95 12/20/2014   TIBC 405 12/20/2014   UIBC 310 12/20/2014   IRONPCTSAT 23 12/20/2014   Lab Results  Component Value Date   RETICCTPCT 1.8 12/20/2014   RBC 4.58 04/01/2016   Lab Results  Component Value Date   KAPLAMBRATIO 1.98 (L) 12/20/2014   No results found for: Kandis Cocking, IGMSERUM Lab Results  Component Value Date   TOTALPROTELP 6.9 12/20/2014   ALBUMINELP 3.4 12/20/2014   A1GS 0.3 12/20/2014   A2GS 0.9 12/20/2014   BETS 1.3 12/20/2014   GAMS 1.1 12/20/2014   MSPIKE Not Observed 12/20/2014   SPEI Comment 12/20/2014     Chemistry      Component Value Date/Time   NA 141 02/25/2016 0857   K 4.0 02/25/2016 0857   CL 102 02/19/2016 1114   CL 104 12/28/2014 1349   CL 104 08/27/2012 1421   CO2 26 02/25/2016 0857   BUN 16.7 02/25/2016 0857   CREATININE 1.0 02/25/2016 0857      Component Value Date/Time   CALCIUM 9.2 02/25/2016 0857   ALKPHOS 138 02/25/2016 0857   AST 34 02/25/2016 0857   ALT 26 02/25/2016 0857   BILITOT 0.97 02/25/2016 0857     Impression and Plan: [name] Fields is 81 year old white male with a history of lung cancer and melanoma. His most recent malignancy was stage I lung cancer diagnosed in April 2013 and treated with stereotactic radiosurgery in July 2013. So far, he has done well and there has been no evidence of recurrence.   It really does prize that he has skin cancer. He has basal cell Cancer that was removed. He will go back to the dermatologist for some more biopsies and  treatments.  I'm glad as splenic count is come back up. I'm still not sure as to why it was low.  I think we can get him back in 4 months now. I want to get him through the wintertime. I don't want him, not in any bad weather.  Volanda Napoleon, MD 12/26/201710:53 AM

## 2016-04-10 DIAGNOSIS — I2510 Atherosclerotic heart disease of native coronary artery without angina pectoris: Secondary | ICD-10-CM | POA: Diagnosis not present

## 2016-04-10 DIAGNOSIS — R296 Repeated falls: Secondary | ICD-10-CM | POA: Diagnosis not present

## 2016-04-10 DIAGNOSIS — E782 Mixed hyperlipidemia: Secondary | ICD-10-CM | POA: Diagnosis not present

## 2016-04-10 DIAGNOSIS — M179 Osteoarthritis of knee, unspecified: Secondary | ICD-10-CM | POA: Diagnosis not present

## 2016-04-10 DIAGNOSIS — N3943 Post-void dribbling: Secondary | ICD-10-CM | POA: Diagnosis not present

## 2016-04-10 DIAGNOSIS — M779 Enthesopathy, unspecified: Secondary | ICD-10-CM | POA: Diagnosis not present

## 2016-04-10 DIAGNOSIS — R269 Unspecified abnormalities of gait and mobility: Secondary | ICD-10-CM | POA: Diagnosis not present

## 2016-04-10 DIAGNOSIS — I10 Essential (primary) hypertension: Secondary | ICD-10-CM | POA: Diagnosis not present

## 2016-04-10 DIAGNOSIS — C3430 Malignant neoplasm of lower lobe, unspecified bronchus or lung: Secondary | ICD-10-CM | POA: Diagnosis not present

## 2016-04-10 DIAGNOSIS — R351 Nocturia: Secondary | ICD-10-CM | POA: Diagnosis not present

## 2016-04-10 DIAGNOSIS — E559 Vitamin D deficiency, unspecified: Secondary | ICD-10-CM | POA: Diagnosis not present

## 2016-04-10 DIAGNOSIS — Z1389 Encounter for screening for other disorder: Secondary | ICD-10-CM | POA: Diagnosis not present

## 2016-04-10 DIAGNOSIS — Z79899 Other long term (current) drug therapy: Secondary | ICD-10-CM | POA: Diagnosis not present

## 2016-04-10 DIAGNOSIS — Z0001 Encounter for general adult medical examination with abnormal findings: Secondary | ICD-10-CM | POA: Diagnosis not present

## 2016-05-01 ENCOUNTER — Other Ambulatory Visit: Payer: Self-pay | Admitting: Dermatology

## 2016-05-01 DIAGNOSIS — C4441 Basal cell carcinoma of skin of scalp and neck: Secondary | ICD-10-CM | POA: Diagnosis not present

## 2016-05-13 DIAGNOSIS — Z4802 Encounter for removal of sutures: Secondary | ICD-10-CM | POA: Diagnosis not present

## 2016-05-14 DIAGNOSIS — L570 Actinic keratosis: Secondary | ICD-10-CM | POA: Diagnosis not present

## 2016-05-14 DIAGNOSIS — R3911 Hesitancy of micturition: Secondary | ICD-10-CM | POA: Diagnosis not present

## 2016-05-14 DIAGNOSIS — R300 Dysuria: Secondary | ICD-10-CM | POA: Diagnosis not present

## 2016-05-29 ENCOUNTER — Other Ambulatory Visit: Payer: Self-pay | Admitting: Orthopedic Surgery

## 2016-05-29 DIAGNOSIS — M25511 Pain in right shoulder: Secondary | ICD-10-CM | POA: Diagnosis not present

## 2016-06-08 ENCOUNTER — Ambulatory Visit
Admission: RE | Admit: 2016-06-08 | Discharge: 2016-06-08 | Disposition: A | Discharge status: Home / Self Care | Payer: Medicare Other | Source: Ambulatory Visit | Attending: Orthopedic Surgery | Admitting: Orthopedic Surgery

## 2016-06-08 DIAGNOSIS — M25411 Effusion, right shoulder: Secondary | ICD-10-CM | POA: Diagnosis not present

## 2016-06-08 DIAGNOSIS — M25511 Pain in right shoulder: Secondary | ICD-10-CM

## 2016-06-10 ENCOUNTER — Other Ambulatory Visit: Payer: Self-pay

## 2016-06-10 DIAGNOSIS — M25511 Pain in right shoulder: Secondary | ICD-10-CM | POA: Diagnosis not present

## 2016-06-18 ENCOUNTER — Other Ambulatory Visit: Payer: Self-pay | Admitting: *Deleted

## 2016-06-18 DIAGNOSIS — C4441 Basal cell carcinoma of skin of scalp and neck: Secondary | ICD-10-CM | POA: Diagnosis not present

## 2016-06-19 ENCOUNTER — Ambulatory Visit (HOSPITAL_BASED_OUTPATIENT_CLINIC_OR_DEPARTMENT_OTHER): Payer: Medicare Other

## 2016-06-19 ENCOUNTER — Other Ambulatory Visit: Payer: Medicare Other

## 2016-06-19 VITALS — BP 111/58 | HR 66 | Temp 97.4°F | Resp 16

## 2016-06-19 DIAGNOSIS — Z85118 Personal history of other malignant neoplasm of bronchus and lung: Secondary | ICD-10-CM

## 2016-06-19 DIAGNOSIS — Z452 Encounter for adjustment and management of vascular access device: Secondary | ICD-10-CM | POA: Diagnosis not present

## 2016-06-19 DIAGNOSIS — Z95828 Presence of other vascular implants and grafts: Secondary | ICD-10-CM

## 2016-06-19 MED ORDER — SODIUM CHLORIDE 0.9% FLUSH
10.0000 mL | INTRAVENOUS | Status: DC | PRN
  Administered 2016-06-19: 10 mL via INTRAVENOUS
  Filled 2016-06-19: qty 10

## 2016-06-19 MED ORDER — HEPARIN SOD (PORK) LOCK FLUSH 100 UNIT/ML IV SOLN
500.0000 [IU] | Freq: Once | INTRAVENOUS | Status: AC
  Administered 2016-06-19: 500 [IU] via INTRAVENOUS
  Filled 2016-06-19: qty 5

## 2016-06-19 NOTE — Patient Instructions (Signed)
Implanted Port Home Guide An implanted port is a type of central line that is placed under the skin. Central lines are used to provide IV access when treatment or nutrition needs to be given through a person's veins. Implanted ports are used for long-term IV access. An implanted port may be placed because:  You need IV medicine that would be irritating to the small veins in your hands or arms.  You need long-term IV medicines, such as antibiotics.  You need IV nutrition for a long period.  You need frequent blood draws for lab tests.  You need dialysis.  Implanted ports are usually placed in the chest area, but they can also be placed in the upper arm, the abdomen, or the leg. An implanted port has two main parts:  Reservoir. The reservoir is round and will appear as a small, raised area under your skin. The reservoir is the part where a needle is inserted to give medicines or draw blood.  Catheter. The catheter is a thin, flexible tube that extends from the reservoir. The catheter is placed into a large vein. Medicine that is inserted into the reservoir goes into the catheter and then into the vein.  How will I care for my incision site? Do not get the incision site wet. Bathe or shower as directed by your health care provider. How is my port accessed? Special steps must be taken to access the port:  Before the port is accessed, a numbing cream can be placed on the skin. This helps numb the skin over the port site.  Your health care provider uses a sterile technique to access the port. ? Your health care provider must put on a mask and sterile gloves. ? The skin over your port is cleaned carefully with an antiseptic and allowed to dry. ? The port is gently pinched between sterile gloves, and a needle is inserted into the port.  Only "non-coring" port needles should be used to access the port. Once the port is accessed, a blood return should be checked. This helps ensure that the port  is in the vein and is not clogged.  If your port needs to remain accessed for a constant infusion, a clear (transparent) bandage will be placed over the needle site. The bandage and needle will need to be changed every week, or as directed by your health care provider.  Keep the bandage covering the needle clean and dry. Do not get it wet. Follow your health care provider's instructions on how to take a shower or bath while the port is accessed.  If your port does not need to stay accessed, no bandage is needed over the port.  What is flushing? Flushing helps keep the port from getting clogged. Follow your health care provider's instructions on how and when to flush the port. Ports are usually flushed with saline solution or a medicine called heparin. The need for flushing will depend on how the port is used.  If the port is used for intermittent medicines or blood draws, the port will need to be flushed: ? After medicines have been given. ? After blood has been drawn. ? As part of routine maintenance.  If a constant infusion is running, the port may not need to be flushed.  How long will my port stay implanted? The port can stay in for as long as your health care provider thinks it is needed. When it is time for the port to come out, surgery will be   done to remove it. The procedure is similar to the one performed when the port was put in. When should I seek immediate medical care? When you have an implanted port, you should seek immediate medical care if:  You notice a bad smell coming from the incision site.  You have swelling, redness, or drainage at the incision site.  You have more swelling or pain at the port site or the surrounding area.  You have a fever that is not controlled with medicine.  This information is not intended to replace advice given to you by your health care provider. Make sure you discuss any questions you have with your health care provider. Document  Released: 03/24/2005 Document Revised: 08/30/2015 Document Reviewed: 11/29/2012 Elsevier Interactive Patient Education  2017 [employer]  

## 2016-06-23 ENCOUNTER — Encounter: Payer: Self-pay | Admitting: Pulmonary Disease

## 2016-06-23 ENCOUNTER — Ambulatory Visit (INDEPENDENT_AMBULATORY_CARE_PROVIDER_SITE_OTHER): Payer: Medicare Other | Admitting: Pulmonary Disease

## 2016-06-23 VITALS — BP 108/68 | HR 61 | Ht 70.0 in | Wt 195.6 lb

## 2016-06-23 DIAGNOSIS — C3490 Malignant neoplasm of unspecified part of unspecified bronchus or lung: Secondary | ICD-10-CM

## 2016-06-23 DIAGNOSIS — J439 Emphysema, unspecified: Secondary | ICD-10-CM

## 2016-06-23 DIAGNOSIS — J449 Chronic obstructive pulmonary disease, unspecified: Secondary | ICD-10-CM

## 2016-06-23 NOTE — Progress Notes (Signed)
CARDIOLOGY OFFICE NOTE  Date:  06/24/2016    [name] Fields Date of Birth: 1934-07-02 Medical Record #[id number]  PCP:  [name], MD  Cardiologist:  [name]  Chief Complaint  Patient presents with  . Coronary Artery Disease    Follow up visit - seen for Dr. Marlou Fields    History of Present Illness: [name] Fields is a 82 y.o. male who presents today for a follow up visit. Seen for Dr. Marlou Fields. He is a former patient of Dr. Susa Simmonds that I cared for.   He has a history of coronary artery disease with prior lateral wall MI status post multiple stents in the past. Other issues include lung cancer in both sides (treated with radiation), melanoma, fibrosis, recurrent pleural effusion. He has had a pericardial window for an effusion back in 2011.   I saw him back in July of 2017 - he had been referred back for shortness of breath. Productive cough - COPD - I sent him back to pulmonary for evaluation/treatment - seemed more pulmonary related/radiation effects.   Did not understand how to use his Spiriva. Some swelling - getting too much salt. I added some low dose Lasix. He was better on return.   Comes in today. Here alone. Doing ok. Still very limited by his breathing. He has had a place on his neck taken off - turned out ok. No chest pain. Coughing less. Tells me he is having rotator cuff surgery - sometime next month. He does not remember who the surgeon is. Says he has not seen Dr. Inda Merlin in sometime. No regular exercise. Not able to attend to his house - his family does the cleaning/shopping, etc. Tells me that his right shoulder "just hurts too bad" and he has limited mobility but "does not want to take any changes". He is not able to tell me exactly what will be done. Admits his memory is failing.   Past Medical History:  Diagnosis Date  . Anxiety    takes wellbutrin daily  . Arthritis    knees  . COPD (chronic obstructive pulmonary disease) ([place])     . Diverticulosis   . Emphysema of lung ([place])   . GERD (gastroesophageal reflux disease)   . Hearing loss   . Hx of radiation therapy 09/29/11;10/01/11;10/03/11;10/06/11;10/08/11;   RLLlung,50Gy/30f - SBRT  . Hyperlipidemia   . Hypertension   . Hypokinesia    MILD INFERIOR  . IHD (ischemic heart disease)    Remote MI in 2001 with stent to the LCX  . Lactose intolerance   . [place] lung [place]    Dx in October of 2010  . Normal nuclear stress test 2009   EF 63%. No ischemia. 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  . Pericardial effusion July 2011   s/p pericardial window; last echo in August 2012 with minimal effusion  . Pleural effusion, left   . SOB (shortness of breath)    Chronic with known fibrosis  . Status post chemotherapy 06/04/2009 -  09/06/2009    4 cycles of Cisplatin and Gemcitabine with Neulasta Support   . Vertigo    hx. of- ambulates with cane for safety.    Past Surgical History:  Procedure Laterality Date  . CARDIAC CATHETERIZATION  07/11/2003   EF 60%, REPEAT, WHICH SHOWED 30% PROXIMAL NARROWING IN THE RIGHT  CORONARY ARTERY  . CARDIOVASCULAR STRESS TEST  05/2007   EF 63%  . CATARACT EXTRACTION, BILATERAL Bilateral   .  COLONOSCOPY WITH PROPOFOL N/A 01/30/2015   Procedure: COLONOSCOPY WITH PROPOFOL;  Surgeon: Garlan Fair, MD;  Location: [facility];  Service: Endoscopy;  Laterality: N/A;  . CORONARY STENT PLACEMENT  07/1999   STENT TO THE LEFT CIRCUMFLEX  . HERNIA REPAIR     as an adult- R inguinal   . PERICARDIAL WINDOW  July 2011  . TRANSTHORACIC ECHOCARDIOGRAM  02/22/2010   EF 55-60%     Medications: Current Outpatient Prescriptions  Medication Sig Dispense Refill  . aspirin EC 81 MG tablet Take 81 mg by mouth daily.    Marland Kitchen atorvastatin (LIPITOR) 20 MG tablet TAKE 1 TABLET BY MOUTH DAILY 90 tablet 1  . buPROPion (WELLBUTRIN SR) 150 MG 12 hr tablet Take 150 mg by mouth daily.     . Cholecalciferol (VITAMIN D3) 2000 units TABS Take 2,000 Units by  mouth daily.    Marland Kitchen dutasteride (AVODART) 0.5 MG capsule Take 0.5 mg by mouth daily.     . furosemide (LASIX) 20 MG tablet Take 1 tablet (20 mg total) by mouth daily. 30 tablet 6  . metoprolol tartrate (LOPRESSOR) 25 MG tablet TAKE 1/2 TABLET BY MOUTH TWICE DAILY 90 tablet 2  . ranitidine (ZANTAC) 150 MG capsule Take 150 mg by mouth every evening.    . Tamsulosin HCl (FLOMAX) 0.4 MG CAPS Take 0.4 mg by mouth daily with supper.     . temazepam (RESTORIL) 30 MG capsule 1 capsule at bedtime    . venlafaxine XR (EFFEXOR-XR) 150 MG 24 hr capsule Take 1 capsule by mouth daily.  1   No current facility-administered medications for this visit.     Allergies: Allergies  Allergen Reactions  . Sertraline Hcl Other (See Comments)    Patient does not remember the reaction    Social History: The patient  reports that he quit smoking about 22 years ago. His smoking use included Cigarettes. He has a 120.00 pack-year smoking history. He has never used smokeless tobacco. He reports that he does not drink alcohol or use drugs.   Family History: The patient's family history includes Lung cancer in his father; Stroke in his sister.   Review of Systems: Please see the history of present illness.   Otherwise, the review of systems is positive for none.   All other systems are reviewed and negative.   Physical Exam: VS:  BP 112/80   Pulse 73   Ht '5\' 10"'$  (1.778 m)   Wt 195 lb 6.4 oz (88.6 kg)   SpO2 96% Comment: at rest  BMI 28.04 kg/m  .  BMI Body mass index is 28.04 kg/m.  Wt Readings from Last 3 Encounters:  06/24/16 195 lb 6.4 oz (88.6 kg)  06/23/16 195 lb 9.6 oz (88.7 kg)  04/01/16 199 lb (90.3 kg)    General: Pleasant. Well developed, well nourished and in no acute distress.   HEENT: Normal.  Neck: Supple, no JVD, carotid bruits, or masses noted.  Cardiac: Regular rate and rhythm. Heart tones are distant.  No edema.  Respiratory:  Lungs with wheezes scattered throughout. He is short of  breath with walking here today.   GI: Soft and nontender.  MS: No deformity or atrophy. Gait and ROM intact.  Skin: Warm and dry. Color is normal.  Neuro:  Strength and sensation are intact and no gross focal deficits noted.  Psych: Alert, appropriate and with normal affect.   LABORATORY DATA:  EKG:  EKG is ordered today. This shows NSR, 1st degree  AV block, RBBB, inferior Q's - unchanged.   Lab Results  Component Value Date   WBC 8.3 04/01/2016   HGB 13.9 04/01/2016   HCT 42.0 04/01/2016   PLT 135 Platelet count confirmed by slide estimate (L) 04/01/2016   GLUCOSE 104 02/25/2016   ALT 26 02/25/2016   AST 34 02/25/2016   NA 141 02/25/2016   K 4.0 02/25/2016   CL 102 02/19/2016   CREATININE 1.0 02/25/2016   BUN 16.7 02/25/2016   CO2 26 02/25/2016   TSH 5.60 (H) 10/30/2015   INR 1.17 12/20/2014   HGBA1C 5.6 12/20/2014    BNP (last 3 results)  Recent Labs  10/30/15 1131  BNP 124.6*    ProBNP (last 3 results) No results for input(s): PROBNP in the last 8760 hours.   Other Studies Reviewed Today:  Myoview Study Highlights from 04/2015    Nuclear stress EF: 56%.  The study is normal.  This is a low risk study.  The left ventricular ejection fraction is normal (55-65%).   Normal resting and stress perfusion EF 56%    CXR IMPRESSION: Chronic volume loss with loculated effusions on the left. Scarring right base. Stable cardiac silhouette. No new opacity. Overall stable study. Electronically Signed By: Lowella Grip III M.D. On: 10/29/2015 16:13  CT CHEST  1. Today's study demonstrates stable postradiation changes of mass-like fibrosis both in the paramediastinal aspect of the left upper lobe and in the central infrahilar region of the right lower lobe. No definite findings to suggest local recurrence of disease or definite metastatic disease in the thorax. 2. There is a new nondisplaced fracture of the posterolateral aspect of the left sixth  rib. This may be pathologic secondary to postradiation osteonecrosis given the proximity to the treated area in the left lung. The possibility of a metastatic lesion with nondisplaced fracture is not entirely excluded, however, and close attention on followup studies is recommended. 3. Small partially loculated pericardial effusion which exerts slight mass effect upon the right heart. These findings are very similar to prior examinations. No pericardial calcification. 4. Aortic atherosclerosis, in addition to left main and 3 vessel coronary artery disease, with evidence of scarring from prior left circumflex coronary artery territory myocardial infarction, as above. Assessment for potential risk factor modification, dietary therapy or pharmacologic therapy may be warranted, if clinically indicated. 5. Additional incidental findings, as above. Electronically Signed By: Vinnie Langton M.D. On: 10/29/2015 16:54   Echo Study Conclusions 12/2014  - Left ventricle: The cavity size was normal. Wall thickness was increased in a pattern of moderate LVH. Systolic function was normal. The estimated ejection fraction was in the range of 60% to 65%. Wall motion was normal; there were no regional wall motion abnormalities. Doppler parameters are consistent with abnormal left ventricular relaxation (grade 1 diastolic dysfunction). The E/e&' ratio is >15, suggesting elevated LV filling pressure. - Aortic valve: Mildly calcified- there may be fusion of the non and right coronary cusps. There was mild regurgitation. - Mitral valve: Mildly thickened leaflets . There was mild regurgitation. - Left atrium: The atrium was normal in size. - Tricuspid valve: There was mild regurgitation. - Pulmonary arteries: PA peak pressure: 37 mm Hg (S). - Inferior vena cava: The vessel was normal in size. The respirophasic diameter changes were in the normal range (>= 50%), consistent  with normal central venous pressure. - Pericardium, extracardiac: A trivial pericardial effusion was identified. Features were not consistent with tamponade physiology.  Impressions:  - Compared to a  prior echo in 2012, there are a few changes. LV filling pressures appear elevated. There is a persistent trivial pericardial effusion without tamponade features. RVSP is higher and there is now moderate LVH.   Assessment/Plan: 1. Chronic shortness of breath - multifactorial felt to be his most limiting factor. Has some degree of diastolic HF - now on some low dose Lasix - weight is stable. Needs labs today.   2. CAD - no active chest pain noted. Would favor more conservative management. Low risk Myoview from January of 2017.   3. Memory impairment - he has really declined since I last saw him 5 years ago.   4. Past pericardial effusion - s/p window - last CT findings basically unchanged.   5. Prior lung cancers/melanoma - followed by Dr. Marin Olp in [place].  6. Possible pre op clearance - he does not seem to understand what will be done to his shoulder, he does not remember the doctor's name. In light of his multiple medical problems he is felt to be at increased risk for any surgical procedure. He is going to call us back the physician's name - he is unsure if he is going to proceed.   7. HLD - will get labs today.   Current medicines are reviewed with the patient today.  The patient does not have concerns regarding medicines other than what has been noted above.  The following changes have been made:  See above.  Labs/ tests ordered today include:    Orders Placed This Encounter  Procedures  . Basic metabolic panel  . CBC  . Hepatic function panel  . Lipid panel  . TSH  . EKG 12-Lead     Disposition:   FU with me in 6 months.   Patient is agreeable to this plan and will call if any problems develop in the interim.   SignedTruitt Merle, NP    06/24/2016 11:24 AM  [facility] [address] [place], [place]  [address] Phone: [phone] Fax: [phone]

## 2016-06-23 NOTE — Addendum Note (Signed)
Addended by: Jannette Spanner on: 06/23/2016 10:17 AM   Modules accepted: Orders

## 2016-06-23 NOTE — Progress Notes (Signed)
Subjective:    Patient ID: [name] Fields, male    DOB: [birth year], 82 y.o.   MRN: [id number]  C.C.:  Follow-up for Severe COPD w/ Emphysema, NSCLC, & Moderate Restrictive Lung Disease.  HPI  Severe COPD with emphysema: Patient has discontinued all inhaler therapy since last appointment without a significant perceived symptomatic benefit. He reports no significant coughing or wheezing. No exacerbations since last appointment.   Non-small cell lung cancer: Following with Dr. Marin Olp.  Moderate restrict lung disease: Likely due to postradiation effects with fibrosis in the left lung.   Review of Systems No chest pain or pressure. No fever or chills. No abdominal pain or nausea recently.   Allergies  Allergen Reactions  . Sertraline Hcl Other (See Comments)    Patient does not remember the reaction    Current Outpatient Prescriptions on File Prior to Visit  Medication Sig Dispense Refill  . aspirin EC 81 MG tablet Take 81 mg by mouth daily.    Marland Kitchen atorvastatin (LIPITOR) 20 MG tablet TAKE 1 TABLET BY MOUTH DAILY 90 tablet 1  . B Complex Vitamins (VITAMIN B COMPLEX) TABS     . buPROPion (WELLBUTRIN SR) 150 MG 12 hr tablet Take 150 mg by mouth daily.     . cyanocobalamin 500 MCG tablet Take 1 tablet (500 mcg total) by mouth daily. 30 tablet 0  . dutasteride (AVODART) 0.5 MG capsule Take 0.5 mg by mouth daily.     . furosemide (LASIX) 20 MG tablet Take 1 tablet (20 mg total) by mouth daily. 30 tablet 6  . Linaclotide (LINZESS) 145 MCG CAPS capsule Take 145 mcg by mouth daily as needed (constipation).    . metoprolol tartrate (LOPRESSOR) 25 MG tablet TAKE 1/2 TABLET BY MOUTH TWICE DAILY 90 tablet 2  . Spacer/Aero-Holding Chambers (AEROCHAMBER Z-STAT PLUS CHAMBR) MISC Use as directed 1 each 0  . Tamsulosin HCl (FLOMAX) 0.4 MG CAPS Take 0.4 mg by mouth daily with supper.     . temazepam (RESTORIL) 30 MG capsule 1 capsule at bedtime    . venlafaxine XR (EFFEXOR-XR) 150 MG 24 hr capsule  Take 1 capsule by mouth daily.  1  . budesonide-formoterol (SYMBICORT) 160-4.5 MCG/ACT inhaler Inhale 2 puffs into the lungs 2 (two) times daily. (Patient not taking: Reported on 06/23/2016) 1 Inhaler 6  . fluticasone furoate-vilanterol (BREO ELLIPTA) 200-25 MCG/INH AEPB 1 inhalation    . Tiotropium Bromide Monohydrate (SPIRIVA RESPIMAT) 2.5 MCG/ACT AERS Inhale 2 puffs into the lungs daily. (Patient not taking: Reported on 06/23/2016) 1 Inhaler 3   No current facility-administered medications on file prior to visit.     Past Medical History:  Diagnosis Date  . Anxiety    takes wellbutrin daily  . Arthritis    knees  . COPD (chronic obstructive pulmonary disease) ([place])   . Diverticulosis   . Emphysema of lung ([place])   . GERD (gastroesophageal reflux disease)   . Hearing loss   . Hx of radiation therapy 09/29/11;10/01/11;10/03/11;10/06/11;10/08/11;   RLLlung,50Gy/85f - SBRT  . Hyperlipidemia   . Hypertension   . Hypokinesia    MILD INFERIOR  . IHD (ischemic heart disease)    Remote MI in 2001 with stent to the LCX  . Lactose intolerance   . Non-small cell carcinoma of lung ([place]    Dx in October of 2010  . Normal nuclear stress test 2009   EF 63%. No ischemia. 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  . Pericardial effusion July 2011   s/p  pericardial window; last echo in August 2012 with minimal effusion  . Pleural effusion, left   . SOB (shortness of breath)    Chronic with known fibrosis  . Status post chemotherapy 06/04/2009 -  09/06/2009    4 cycles of Cisplatin and Gemcitabine with Neulasta Support   . Vertigo    hx. of- ambulates with cane for safety.    Past Surgical History:  Procedure Laterality Date  . CARDIAC CATHETERIZATION  07/11/2003   EF 60%, REPEAT, WHICH SHOWED 30% PROXIMAL NARROWING IN THE RIGHT  CORONARY ARTERY  . CARDIOVASCULAR STRESS TEST  05/2007   EF 63%  . CATARACT EXTRACTION, BILATERAL Bilateral   . COLONOSCOPY WITH PROPOFOL N/A 01/30/2015   Procedure: COLONOSCOPY WITH  PROPOFOL;  Surgeon: Garlan Fair, MD;  Location: [facility];  Service: Endoscopy;  Laterality: N/A;  . CORONARY STENT PLACEMENT  07/1999   STENT TO THE LEFT CIRCUMFLEX  . HERNIA REPAIR     as an adult- R inguinal   . PERICARDIAL WINDOW  July 2011  . TRANSTHORACIC ECHOCARDIOGRAM  02/22/2010   EF 55-60%    Family History  Problem Relation Age of Onset  . Lung cancer Father   . Stroke Sister   . Heart disease Neg Hx   . Anesthesia problems Neg Hx   . Lung disease Neg Hx     Social History   Social History  . Marital status: Single    Spouse name: N/A  . Number of children: N/A  . Years of education: N/A   Occupational History  . retired Retired    [employer]   Social History Main Topics  . Smoking status: Former Smoker    Packs/day: 3.00    Years: 40.00    Types: Cigarettes    Quit date: 04/07/1994  . Smokeless tobacco: Never Used  . Alcohol use No  . Drug use: No  . Sexual activity: No   Other Topics Concern  . None   Social History Narrative   Originally from [place]. Previously worked for [employer]. Currently has 2 dogs. No bird or mold exposure.       Objective:   Physical Exam BP 108/68 (BP Location: Left Arm, Patient Position: Sitting, Cuff Size: Normal)   Pulse 61   Ht '5\' 10"'$  (1.778 m)   Wt 195 lb 9.6 oz (88.7 kg)   SpO2 95%   BMI 28.07 kg/m   Gen.: No distress. Alert. Awake. Integument: No rash or bruising on exposed skin. Warm and dry. HEENT: No nasal turbinate swelling. Once mucous membranes. No scleral icterus. Pulmonary: Very minimal end expiratory wheeze bilaterally. Slightly diminished breath sounds in the bases. No accessory muscle use on room air. Cardiovascular: Regular rate and rhythm. No edema. Unable to appreciate JVD. Abdomen: Soft. Protuberant. Normal bowel sounds.  PFT 03/20/16: FVC 1.81 L (46%) FEV1 1.16 L (41%) FEV1/FVC 0.64 FEF 25-75 0.65 L (33%) negative bronchodilator response 11/05/15: FVC 1.86 L (47%) FEV1 1.22 L (43%) FEV1/FVC 0.66 FEF  25-75 0.73 L (37%) negative bronchodilator response TLC 4.01 L (88%) RV 86% ERV 33% DLCO uncorrected 32%  6MWT 03/20/16:  Walked 218 meters / Baseline Sat 97% on RA / Nadir Sat 97% on RA (paused for 15 seconds)  IMAGING CTA CHEST 10/29/15 (previously reviewed by me): Heart normal in size. Loculated pericardial effusion noted. Right internal jugular Port-A-Cath noted. No pathologic mediastinal adenopathy. Paramediastinal opacity/mass in left upper lobe that is chronic. Atelectasis of left upper lobe is also appreciated along with  pleural thickening & volume loss in the left lung. Small subpleural nodules noted bilaterally that are all subcentimeter. No obvious progression. Diffuse centrilobular & paraseptal emphysema.  CARDIAC TTE (12/20/14): Moderate LVH with EF 60-65%. No regional wall motion abnormalities. Grade 1 diastolic dysfunction. LA & RA normal in size. RV normal in size and function. Pulmonary artery systolic pressure 37 mmHg. Mild aortic regurgitation. No aortic dilatation. Mild mitral regurgitation. Trivial pulmonic regurgitation. Mild tricuspid regurgitation. Trivial pericardial effusion.  LABS 12/18/15 Alpha-1 antitrypsin: MS (136)  12/03/15 CBC: 8.2/15.7/47.3/251 BMP: 141/4.4/104/27/19.6/1.0/92/9.9 LFT: 2.9/7.9/0.97/121/20/13 LDH:  176  10/30/15 BNP:  124.6  10/26/09 ABG on 2 L/m:  7.38 / 45 / 75 / 95%    Assessment & Plan:  82 y.o. Male with severe COPD with emphysema, non-small cell lung cancer, and moderate restrictive lung disease. Overall patient's cough has significantly improved with the addition of Zantac to his regimen. They seemed to have no symptoms with regards to his underlying COPD/emphysema. As such, I feel it's reasonable to continue to hold on his inhaler medications. He does have appropriate follow-up with medical oncology and his underlying lung restriction is likely due to some post radiation changes within her lung tissue. We will need to continue to trend  his pulmonary function and I did encourage him to continue to monitor symptoms and notify me for any changes.  1. Severe COPD with emphysema: Continuing to hold on inhaler therapies at this time. Repeat spirometry with bronchodilator challenge & DLCO at next appointment. 2. Non-small cell lung cancer: Following with medical oncology. 3. Moderate restrictive lung disease: No need for further testing at this time. 4. Health maintenance:  Status post Pneumovax 27 January 2010 & Prevnar 19 December 2015. Reports he did have the Influenza Vaccine.  5. Follow-up: Return to clinic in 6 months or sooner if needed.   Sonia Baller Ashok Cordia, M.D. [facility] Pager:  [phone] After 3pm or if no response, call [phone] 10:02 AM 06/23/16

## 2016-06-23 NOTE — Patient Instructions (Signed)
   You can continue to hold off on your inhalers.  Call me if you notice any consistent coughing, wheezing, or new breathing problems before your next appointment.  I will see you back in 6 months with a breathing test or sooner if needed.   TESTS ORDERED: 1. Spirometry with bronchodilator challenge & DLCO at next appointment

## 2016-06-24 ENCOUNTER — Ambulatory Visit (INDEPENDENT_AMBULATORY_CARE_PROVIDER_SITE_OTHER): Payer: Medicare Other | Admitting: Nurse Practitioner

## 2016-06-24 ENCOUNTER — Encounter: Payer: Self-pay | Admitting: Nurse Practitioner

## 2016-06-24 ENCOUNTER — Telehealth: Payer: Self-pay | Admitting: *Deleted

## 2016-06-24 VITALS — BP 112/80 | HR 73 | Ht 70.0 in | Wt 195.4 lb

## 2016-06-24 DIAGNOSIS — I259 Chronic ischemic heart disease, unspecified: Secondary | ICD-10-CM

## 2016-06-24 DIAGNOSIS — R0602 Shortness of breath: Secondary | ICD-10-CM | POA: Diagnosis not present

## 2016-06-24 DIAGNOSIS — E7800 Pure hypercholesterolemia, unspecified: Secondary | ICD-10-CM

## 2016-06-24 NOTE — Patient Instructions (Addendum)
We will be checking the following labs today - BMET, CBC, HPF and lipids   Medication Instructions:    Continue with your current medicines.     Testing/Procedures To Be Arranged:  N/A  Follow-Up:   See me in 6 months    Other Special Instructions:   Call back up here and give [place] the name of the surgeon you are seeing.   You are felt to be at increased for any type of surgical procedures     If you need a refill on your cardiac medications before your next appointment, please call your pharmacy.   Call the [place] office at [phone] if you have any questions, problems or concerns.

## 2016-06-24 NOTE — Telephone Encounter (Signed)
Pt's surgeon for Rotator Cuff is Dr.Daniel French Ana, will send to Cecille Rubin to advise.

## 2016-06-24 NOTE — Telephone Encounter (Signed)
Routed today's ov note to Dr. Earlie Server at [facility] and [place].

## 2016-06-24 NOTE — Telephone Encounter (Signed)
I have sent him my note.

## 2016-06-25 ENCOUNTER — Telehealth: Payer: Self-pay

## 2016-06-25 LAB — BASIC METABOLIC PANEL
BUN/Creatinine Ratio: 17 (ref 10–24)
BUN: 15 mg/dL (ref 8–27)
CO2: 29 mmol/L (ref 18–29)
Calcium: 10 mg/dL (ref 8.6–10.2)
Chloride: 102 mmol/L (ref 96–106)
Creatinine, Ser: 0.9 mg/dL (ref 0.76–1.27)
GFR calc Af Amer: 94 mL/min/{1.73_m2} (ref >59)
GFR calc non Af Amer: 81 mL/min/{1.73_m2} (ref >59)
Glucose: 78 mg/dL (ref 65–99)
Potassium: 4.8 mmol/L (ref 3.5–5.2)
Sodium: 147 mmol/L — ABNORMAL HIGH (ref 134–144)

## 2016-06-25 LAB — HEPATIC FUNCTION PANEL
ALT: 17 IU/L (ref 0–44)
AST: 20 IU/L (ref 0–40)
Albumin: 4.6 g/dL (ref 3.5–4.8)
Alkaline Phosphatase: 133 IU/L — ABNORMAL HIGH (ref 39–117)
Bilirubin Total: 0.7 mg/dL (ref 0.0–1.2)
Bilirubin, Direct: 0.24 mg/dL (ref 0.00–0.40)
Total Protein: 7.1 g/dL (ref 6.0–8.5)

## 2016-06-25 LAB — LIPID PANEL
Chol/HDL Ratio: 2.1 ratio units (ref 0.0–5.0)
Cholesterol, Total: 147 mg/dL (ref 100–199)
HDL: 70 mg/dL (ref >39)
LDL Calculated: 45 mg/dL (ref 0–99)
Triglycerides: 158 mg/dL — ABNORMAL HIGH (ref 0–149)
VLDL Cholesterol Cal: 32 mg/dL (ref 5–40)

## 2016-06-25 LAB — CBC
Hematocrit: 46.5 % (ref 37.5–51.0)
Hemoglobin: 15 g/dL (ref 13.0–17.7)
MCH: 29.4 pg (ref 26.6–33.0)
MCHC: 32.3 g/dL (ref 31.5–35.7)
MCV: 91 fL (ref 79–97)
Platelets: 259 10*3/uL (ref 150–379)
RBC: 5.1 x10E6/uL (ref 4.14–5.80)
RDW: 14.1 % (ref 12.3–15.4)
WBC: 8.6 10*3/uL (ref 3.4–10.8)

## 2016-06-25 LAB — TSH: TSH: 5.02 u[IU]/mL — ABNORMAL HIGH (ref 0.450–4.500)

## 2016-06-25 NOTE — Telephone Encounter (Signed)
Received fax prior to patient's 06/23/16 appointment from [facility] orthopedic specialists for surgery clearance from pulmonary standpoint. During appointment, form was forgotten and was not signed. Pt was called to see if he still needed the form. Pt stated his cardiologist did not advise surgery. Will give form to Dr. Ashok Cordia for decision.

## 2016-07-01 ENCOUNTER — Telehealth: Payer: Self-pay | Admitting: Pulmonary Disease

## 2016-07-02 NOTE — Telephone Encounter (Signed)
A fax was sent to [facility] at [phone] for pre-operative clearance. A confirmation fax was received. Nothing further is needed.

## 2016-07-07 ENCOUNTER — Telehealth: Payer: Self-pay | Admitting: *Deleted

## 2016-07-07 ENCOUNTER — Telehealth: Payer: Self-pay | Admitting: Nurse Practitioner

## 2016-07-07 NOTE — Telephone Encounter (Signed)
New message    Claiborne Billings from [facility] is returning call to [place] about pt.

## 2016-07-07 NOTE — Telephone Encounter (Signed)
s/w pt is declining surgery at this time.

## 2016-07-07 NOTE — Telephone Encounter (Signed)
Kelly from [place] wanted to know why pt is being cleared for surgery stated in ov note multiple issues.

## 2016-07-07 NOTE — Telephone Encounter (Signed)
Faxing to [facility] at [employer] pt is declining surgery at this time.

## 2016-07-07 NOTE — Telephone Encounter (Signed)
error 

## 2016-07-07 NOTE — Telephone Encounter (Signed)
lvm for [facility] at [place] and [place] to let know pt is declining surgery at this time.

## 2016-07-31 ENCOUNTER — Ambulatory Visit (HOSPITAL_BASED_OUTPATIENT_CLINIC_OR_DEPARTMENT_OTHER): Payer: Medicare Other | Admitting: Family

## 2016-07-31 ENCOUNTER — Ambulatory Visit (HOSPITAL_BASED_OUTPATIENT_CLINIC_OR_DEPARTMENT_OTHER): Payer: Medicare Other

## 2016-07-31 ENCOUNTER — Other Ambulatory Visit (HOSPITAL_BASED_OUTPATIENT_CLINIC_OR_DEPARTMENT_OTHER): Payer: Medicare Other

## 2016-07-31 VITALS — BP 118/72 | HR 61 | Temp 97.7°F | Resp 18 | Wt 198.0 lb

## 2016-07-31 DIAGNOSIS — D696 Thrombocytopenia, unspecified: Secondary | ICD-10-CM | POA: Diagnosis not present

## 2016-07-31 DIAGNOSIS — Z85820 Personal history of malignant melanoma of skin: Secondary | ICD-10-CM | POA: Diagnosis not present

## 2016-07-31 DIAGNOSIS — Z85118 Personal history of other malignant neoplasm of bronchus and lung: Secondary | ICD-10-CM

## 2016-07-31 DIAGNOSIS — I259 Chronic ischemic heart disease, unspecified: Secondary | ICD-10-CM | POA: Diagnosis not present

## 2016-07-31 DIAGNOSIS — C3430 Malignant neoplasm of lower lobe, unspecified bronchus or lung: Secondary | ICD-10-CM

## 2016-07-31 DIAGNOSIS — I2510 Atherosclerotic heart disease of native coronary artery without angina pectoris: Secondary | ICD-10-CM

## 2016-07-31 DIAGNOSIS — C3431 Malignant neoplasm of lower lobe, right bronchus or lung: Secondary | ICD-10-CM

## 2016-07-31 DIAGNOSIS — Z95828 Presence of other vascular implants and grafts: Secondary | ICD-10-CM

## 2016-07-31 LAB — COMPREHENSIVE METABOLIC PANEL
ALT: 12 U/L (ref 0–55)
AST: 19 U/L (ref 5–34)
Albumin: 3.7 g/dL (ref 3.5–5.0)
Alkaline Phosphatase: 91 U/L (ref 40–150)
Anion Gap: 10 mEq/L (ref 3–11)
BILIRUBIN TOTAL: 1 mg/dL (ref 0.20–1.20)
BUN: 15.5 mg/dL (ref 7.0–26.0)
CO2: 27 mEq/L (ref 22–29)
Calcium: 9.3 mg/dL (ref 8.4–10.4)
Chloride: 105 mEq/L (ref 98–109)
Creatinine: 0.8 mg/dL (ref 0.7–1.3)
EGFR: 86 mL/min/{1.73_m2} — AB (ref >90)
Glucose: 82 mg/dl (ref 70–140)
Potassium: 4.1 mEq/L (ref 3.5–5.1)
Sodium: 141 mEq/L (ref 136–145)
TOTAL PROTEIN: 7 g/dL (ref 6.4–8.3)

## 2016-07-31 LAB — CBC WITH DIFFERENTIAL (CANCER CENTER ONLY)
BASO#: 0 10*3/uL (ref 0.0–0.2)
BASO%: 0.6 % (ref 0.0–2.0)
EOS ABS: 0.3 10*3/uL (ref 0.0–0.5)
EOS%: 4 % (ref 0.0–7.0)
HCT: 43.1 % (ref 38.7–49.9)
HGB: 14.1 g/dL (ref 13.0–17.1)
LYMPH#: 1.2 10*3/uL (ref 0.9–3.3)
LYMPH%: 18.3 % (ref 14.0–48.0)
MCH: 30.1 pg (ref 28.0–33.4)
MCHC: 32.7 g/dL (ref 32.0–35.9)
MCV: 92 fL (ref 82–98)
MONO#: 0.7 10*3/uL (ref 0.1–0.9)
MONO%: 9.6 % (ref 0.0–13.0)
NEUT#: 4.6 10*3/uL (ref 1.5–6.5)
NEUT%: 67.5 % (ref 40.0–80.0)
PLATELETS: 238 10*3/uL (ref 145–400)
RBC: 4.68 10*6/uL (ref 4.20–5.70)
RDW: 13.7 % (ref 11.1–15.7)
WBC: 6.8 10*3/uL (ref 4.0–10.0)

## 2016-07-31 MED ORDER — HEPARIN SOD (PORK) LOCK FLUSH 100 UNIT/ML IV SOLN
500.0000 [IU] | Freq: Once | INTRAVENOUS | Status: AC
  Administered 2016-07-31: 500 [IU] via INTRAVENOUS
  Filled 2016-07-31: qty 5

## 2016-07-31 MED ORDER — SODIUM CHLORIDE 0.9% FLUSH
10.0000 mL | INTRAVENOUS | Status: DC | PRN
  Administered 2016-07-31: 10 mL via INTRAVENOUS
  Filled 2016-07-31: qty 10

## 2016-07-31 NOTE — Patient Instructions (Signed)
Implanted Port Home Guide An implanted port is a type of central line that is placed under the skin. Central lines are used to provide IV access when treatment or nutrition needs to be given through a person's veins. Implanted ports are used for long-term IV access. An implanted port may be placed because:  You need IV medicine that would be irritating to the small veins in your hands or arms.  You need long-term IV medicines, such as antibiotics.  You need IV nutrition for a long period.  You need frequent blood draws for lab tests.  You need dialysis.  Implanted ports are usually placed in the chest area, but they can also be placed in the upper arm, the abdomen, or the leg. An implanted port has two main parts:  Reservoir. The reservoir is round and will appear as a small, raised area under your skin. The reservoir is the part where a needle is inserted to give medicines or draw blood.  Catheter. The catheter is a thin, flexible tube that extends from the reservoir. The catheter is placed into a large vein. Medicine that is inserted into the reservoir goes into the catheter and then into the vein.  How will I care for my incision site? Do not get the incision site wet. Bathe or shower as directed by your health care provider. How is my port accessed? Special steps must be taken to access the port:  Before the port is accessed, a numbing cream can be placed on the skin. This helps numb the skin over the port site.  Your health care provider uses a sterile technique to access the port. ? Your health care provider must put on a mask and sterile gloves. ? The skin over your port is cleaned carefully with an antiseptic and allowed to dry. ? The port is gently pinched between sterile gloves, and a needle is inserted into the port.  Only "non-coring" port needles should be used to access the port. Once the port is accessed, a blood return should be checked. This helps ensure that the port  is in the vein and is not clogged.  If your port needs to remain accessed for a constant infusion, a clear (transparent) bandage will be placed over the needle site. The bandage and needle will need to be changed every week, or as directed by your health care provider.  Keep the bandage covering the needle clean and dry. Do not get it wet. Follow your health care provider's instructions on how to take a shower or bath while the port is accessed.  If your port does not need to stay accessed, no bandage is needed over the port.  What is flushing? Flushing helps keep the port from getting clogged. Follow your health care provider's instructions on how and when to flush the port. Ports are usually flushed with saline solution or a medicine called heparin. The need for flushing will depend on how the port is used.  If the port is used for intermittent medicines or blood draws, the port will need to be flushed: ? After medicines have been given. ? After blood has been drawn. ? As part of routine maintenance.  If a constant infusion is running, the port may not need to be flushed.  How long will my port stay implanted? The port can stay in for as long as your health care provider thinks it is needed. When it is time for the port to come out, surgery will be   done to remove it. The procedure is similar to the one performed when the port was put in. When should I seek immediate medical care? When you have an implanted port, you should seek immediate medical care if:  You notice a bad smell coming from the incision site.  You have swelling, redness, or drainage at the incision site.  You have more swelling or pain at the port site or the surrounding area.  You have a fever that is not controlled with medicine.  This information is not intended to replace advice given to you by your health care provider. Make sure you discuss any questions you have with your health care provider. Document  Released: 03/24/2005 Document Revised: 08/30/2015 Document Reviewed: 11/29/2012 Elsevier Interactive Patient Education  2017 [employer]  

## 2016-07-31 NOTE — Progress Notes (Signed)
Hematology and Oncology Follow Up Visit  SHED NIXON [id number] February 03, 1935 82 y.o. 07/31/2016   Principle Diagnosis:  1. 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 Dilger is here today for follow-up. He is doing fairly well. He s having pain in the right shoulder due to rotator cuff damage but states that his cardiologist does not feel that he would tolerate surgery. He sees his PCP next week and plans to discuss injections into the shoulder with him at that time.  No anemia, platelet count is stable at 238. He has had no episodes of bleeding, bruising or petechiae.  He states that he continues to be followed by dermatology and has had no new lesions removed since his last visit. No lesions noted on today's exam.  His SOB waxes and wanes. He states that he has some days where it does not bother him at all and other days where it is a little worse. He has an inhaler that he uses PRN and will take breaks to rest as needed.  He has also had a bout with vertigo and dizziness. One episode caused him to fall outside his home and he spent 3 days in the hospital. He is feeling much better and states that he has Antivert to take as needed. No other falls and no syncopal episodes.  He uses his cane when ambulating for support.  No fever, chills, n/v, cough, rash, chest pain, palpitations, abdominal pain or changes in bowel or bladder habits.  No swelling, tenderness, numbness or tingling in her extremities. No new aches or pains.  He has maintained a good appetite and is staying well hydrated. His weight is stable.   ECOG Performance Status: 1 - Symptomatic but completely ambulatory  Medications:  Allergies as of 07/31/2016      Reactions   Sertraline Hcl Other (See Comments)   Patient does  not remember the reaction      Medication List       Accurate as of 07/31/16 10:37 AM. Always use your most recent med list.          aspirin EC 81 MG tablet Take 81 mg by mouth daily.   atorvastatin 20 MG tablet Commonly known as:  LIPITOR TAKE 1 TABLET BY MOUTH DAILY   buPROPion 150 MG 12 hr tablet Commonly known as:  WELLBUTRIN SR Take 150 mg by mouth daily.   dutasteride 0.5 MG capsule Commonly known as:  AVODART Take 0.5 mg by mouth daily.   furosemide 20 MG tablet Commonly known as:  LASIX Take 1 tablet (20 mg total) by mouth daily.   metoprolol tartrate 25 MG tablet Commonly known as:  LOPRESSOR TAKE 1/2 TABLET BY MOUTH TWICE DAILY   ranitidine 150 MG capsule Commonly known as:  ZANTAC Take 150 mg by mouth every evening.   tamsulosin 0.4 MG Caps capsule Commonly known as:  FLOMAX Take 0.4 mg by mouth daily with supper.   temazepam 30 MG capsule Commonly known as:  RESTORIL 1 capsule at bedtime   venlafaxine XR 150 MG 24 hr capsule Commonly known as:  EFFEXOR-XR Take 1 capsule by mouth daily.   Vitamin D3 2000 units Tabs Take 2,000 Units by mouth daily.       Allergies:  Allergies  Allergen  Reactions  . Sertraline Hcl Other (See Comments)    Patient does not remember the reaction    Past Medical History, Surgical history, Social history, and Family History were reviewed and updated.  Review of Systems: All other 10 point review of systems is negative.   Physical Exam:  weight is 198 lb (89.8 kg). His oral temperature is 97.7 F (36.5 C). His blood pressure is 118/72 and his pulse is 61. His respiration is 18 and oxygen saturation is 98%.   Wt Readings from Last 3 Encounters:  07/31/16 198 lb (89.8 kg)  06/24/16 195 lb 6.4 oz (88.6 kg)  06/23/16 195 lb 9.6 oz (88.7 kg)    Ocular: Sclerae unicteric, pupils equal, round and reactive to light Ear-nose-throat: Oropharynx clear, dentition fair Lymphatic: No cervical, supraclavicular and  axillary adenopathy Lungs no rales or rhonchi, good excursion bilaterally Heart regular rate and rhythm, no murmur appreciated Abd soft, nontender, positive bowel sounds, no liver or spleen tip palpated on exam, no fluid wave  MSK no focal spinal tenderness, no joint edema Neuro: non-focal, well-oriented, appropriate affect Breasts: Deferred  Lab Results  Component Value Date   WBC 8.6 06/24/2016   HGB 13.9 04/01/2016   HCT 46.5 06/24/2016   MCV 91 06/24/2016   PLT 259 06/24/2016   Lab Results  Component Value Date   FERRITIN 92 12/20/2014   IRON 95 12/20/2014   TIBC 405 12/20/2014   UIBC 310 12/20/2014   IRONPCTSAT 23 12/20/2014   Lab Results  Component Value Date   RETICCTPCT 1.8 12/20/2014   RBC 5.10 06/24/2016   Lab Results  Component Value Date   KAPLAMBRATIO 1.98 (L) 12/20/2014   No results found for: Kandis Cocking, IGMSERUM Lab Results  Component Value Date   TOTALPROTELP 6.9 12/20/2014   ALBUMINELP 3.4 12/20/2014   A1GS 0.3 12/20/2014   A2GS 0.9 12/20/2014   BETS 1.3 12/20/2014   GAMS 1.1 12/20/2014   MSPIKE Not Observed 12/20/2014   SPEI Comment 12/20/2014     Chemistry      Component Value Date/Time   NA 147 (H) 06/24/2016 1129   NA 141 02/25/2016 0857   K 4.8 06/24/2016 1129   K 4.0 02/25/2016 0857   CL 102 06/24/2016 1129   CL 104 12/28/2014 1349   CL 104 08/27/2012 1421   CO2 29 06/24/2016 1129   CO2 26 02/25/2016 0857   BUN 15 06/24/2016 1129   BUN 16.7 02/25/2016 0857   CREATININE 0.90 06/24/2016 1129   CREATININE 1.0 02/25/2016 0857      Component Value Date/Time   CALCIUM 10.0 06/24/2016 1129   CALCIUM 9.2 02/25/2016 0857   ALKPHOS 133 (H) 06/24/2016 1129   ALKPHOS 138 02/25/2016 0857   AST 20 06/24/2016 1129   AST 34 02/25/2016 0857   ALT 17 06/24/2016 1129   ALT 26 02/25/2016 0857   BILITOT 0.7 06/24/2016 1129   BILITOT 0.97 02/25/2016 0857      Impression and Plan: Mr. Holzman is a pleasant 82 yo caucasian gentleman with  history of both the left and right lung cancers and melanoma. His most recent malignancy was stage I lung cancer diagnosed in April 2013. This was treated with stereotactic radiosugery that same year in July. Her has done well and so dar there has been no evidence of recurrence.  He states that he continues to follow up with dermatology yearly.  We also follow him for ITP. His platelet count at this time is 238. No  anemia. We will continue to flush his port every 3 weeks and plan to see him back in 4 months for repeat lab work and follow-up.  He will contact our office with any questions or concerns. We can certainly see him sooner if need be.   Eliezer Bottom, NP 4/26/201810:37 AM

## 2016-09-29 ENCOUNTER — Ambulatory Visit (HOSPITAL_BASED_OUTPATIENT_CLINIC_OR_DEPARTMENT_OTHER): Payer: Medicare Other

## 2016-09-29 VITALS — BP 129/62 | HR 64 | Temp 97.6°F | Resp 16

## 2016-09-29 DIAGNOSIS — Z452 Encounter for adjustment and management of vascular access device: Secondary | ICD-10-CM

## 2016-09-29 DIAGNOSIS — Z85118 Personal history of other malignant neoplasm of bronchus and lung: Secondary | ICD-10-CM

## 2016-09-29 DIAGNOSIS — Z95828 Presence of other vascular implants and grafts: Secondary | ICD-10-CM

## 2016-09-29 MED ORDER — HEPARIN SOD (PORK) LOCK FLUSH 100 UNIT/ML IV SOLN
500.0000 [IU] | Freq: Once | INTRAVENOUS | Status: AC
  Administered 2016-09-29: 500 [IU] via INTRAVENOUS
  Filled 2016-09-29: qty 5

## 2016-09-29 MED ORDER — SODIUM CHLORIDE 0.9% FLUSH
10.0000 mL | INTRAVENOUS | Status: DC | PRN
  Administered 2016-09-29: 10 mL via INTRAVENOUS
  Filled 2016-09-29: qty 10

## 2016-09-29 NOTE — Patient Instructions (Signed)
Implanted Port Home Guide An implanted port is a type of central line that is placed under the skin. Central lines are used to provide IV access when treatment or nutrition needs to be given through a person's veins. Implanted ports are used for long-term IV access. An implanted port may be placed because:  You need IV medicine that would be irritating to the small veins in your hands or arms.  You need long-term IV medicines, such as antibiotics.  You need IV nutrition for a long period.  You need frequent blood draws for lab tests.  You need dialysis.  Implanted ports are usually placed in the chest area, but they can also be placed in the upper arm, the abdomen, or the leg. An implanted port has two main parts:  Reservoir. The reservoir is round and will appear as a small, raised area under your skin. The reservoir is the part where a needle is inserted to give medicines or draw blood.  Catheter. The catheter is a thin, flexible tube that extends from the reservoir. The catheter is placed into a large vein. Medicine that is inserted into the reservoir goes into the catheter and then into the vein.  How will I care for my incision site? Do not get the incision site wet. Bathe or shower as directed by your health care provider. How is my port accessed? Special steps must be taken to access the port:  Before the port is accessed, a numbing cream can be placed on the skin. This helps numb the skin over the port site.  Your health care provider uses a sterile technique to access the port. ? Your health care provider must put on a mask and sterile gloves. ? The skin over your port is cleaned carefully with an antiseptic and allowed to dry. ? The port is gently pinched between sterile gloves, and a needle is inserted into the port.  Only "non-coring" port needles should be used to access the port. Once the port is accessed, a blood return should be checked. This helps ensure that the port  is in the vein and is not clogged.  If your port needs to remain accessed for a constant infusion, a clear (transparent) bandage will be placed over the needle site. The bandage and needle will need to be changed every week, or as directed by your health care provider.  Keep the bandage covering the needle clean and dry. Do not get it wet. Follow your health care provider's instructions on how to take a shower or bath while the port is accessed.  If your port does not need to stay accessed, no bandage is needed over the port.  What is flushing? Flushing helps keep the port from getting clogged. Follow your health care provider's instructions on how and when to flush the port. Ports are usually flushed with saline solution or a medicine called heparin. The need for flushing will depend on how the port is used.  If the port is used for intermittent medicines or blood draws, the port will need to be flushed: ? After medicines have been given. ? After blood has been drawn. ? As part of routine maintenance.  If a constant infusion is running, the port may not need to be flushed.  How long will my port stay implanted? The port can stay in for as long as your health care provider thinks it is needed. When it is time for the port to come out, surgery will be   done to remove it. The procedure is similar to the one performed when the port was put in. When should I seek immediate medical care? When you have an implanted port, you should seek immediate medical care if:  You notice a bad smell coming from the incision site.  You have swelling, redness, or drainage at the incision site.  You have more swelling or pain at the port site or the surrounding area.  You have a fever that is not controlled with medicine.  This information is not intended to replace advice given to you by your health care provider. Make sure you discuss any questions you have with your health care provider. Document  Released: 03/24/2005 Document Revised: 08/30/2015 Document Reviewed: 11/29/2012 Elsevier Interactive Patient Education  2017 [employer]  

## 2016-11-04 ENCOUNTER — Emergency Department (HOSPITAL_COMMUNITY): Payer: Medicare Other

## 2016-11-04 ENCOUNTER — Encounter (HOSPITAL_COMMUNITY): Payer: Self-pay | Admitting: *Deleted

## 2016-11-04 ENCOUNTER — Observation Stay (HOSPITAL_COMMUNITY)
Admission: EM | Admit: 2016-11-04 | Discharge: 2016-11-05 | Disposition: A | Discharge status: Home / Self Care | Payer: Medicare Other | Attending: Internal Medicine | Admitting: Internal Medicine

## 2016-11-04 DIAGNOSIS — K3189 Other diseases of stomach and duodenum: Secondary | ICD-10-CM | POA: Insufficient documentation

## 2016-11-04 DIAGNOSIS — K219 Gastro-esophageal reflux disease without esophagitis: Secondary | ICD-10-CM | POA: Insufficient documentation

## 2016-11-04 DIAGNOSIS — C3431 Malignant neoplasm of lower lobe, right bronchus or lung: Secondary | ICD-10-CM | POA: Insufficient documentation

## 2016-11-04 DIAGNOSIS — Z955 Presence of coronary angioplasty implant and graft: Secondary | ICD-10-CM | POA: Insufficient documentation

## 2016-11-04 DIAGNOSIS — F419 Anxiety disorder, unspecified: Secondary | ICD-10-CM | POA: Diagnosis not present

## 2016-11-04 DIAGNOSIS — J8410 Pulmonary fibrosis, unspecified: Secondary | ICD-10-CM | POA: Insufficient documentation

## 2016-11-04 DIAGNOSIS — R079 Chest pain, unspecified: Secondary | ICD-10-CM | POA: Diagnosis present

## 2016-11-04 DIAGNOSIS — E739 Lactose intolerance, unspecified: Secondary | ICD-10-CM | POA: Insufficient documentation

## 2016-11-04 DIAGNOSIS — I119 Hypertensive heart disease without heart failure: Secondary | ICD-10-CM | POA: Insufficient documentation

## 2016-11-04 DIAGNOSIS — I10 Essential (primary) hypertension: Secondary | ICD-10-CM | POA: Diagnosis present

## 2016-11-04 DIAGNOSIS — K439 Ventral hernia without obstruction or gangrene: Secondary | ICD-10-CM | POA: Diagnosis not present

## 2016-11-04 DIAGNOSIS — J80 Acute respiratory distress syndrome: Secondary | ICD-10-CM | POA: Diagnosis not present

## 2016-11-04 DIAGNOSIS — R42 Dizziness and giddiness: Secondary | ICD-10-CM

## 2016-11-04 DIAGNOSIS — I2510 Atherosclerotic heart disease of native coronary artery without angina pectoris: Secondary | ICD-10-CM | POA: Insufficient documentation

## 2016-11-04 DIAGNOSIS — J90 Pleural effusion, not elsewhere classified: Secondary | ICD-10-CM | POA: Insufficient documentation

## 2016-11-04 DIAGNOSIS — I3139 Other pericardial effusion (noninflammatory): Secondary | ICD-10-CM | POA: Diagnosis present

## 2016-11-04 DIAGNOSIS — Z9841 Cataract extraction status, right eye: Secondary | ICD-10-CM | POA: Insufficient documentation

## 2016-11-04 DIAGNOSIS — R1084 Generalized abdominal pain: Secondary | ICD-10-CM

## 2016-11-04 DIAGNOSIS — K5900 Constipation, unspecified: Secondary | ICD-10-CM | POA: Diagnosis not present

## 2016-11-04 DIAGNOSIS — D696 Thrombocytopenia, unspecified: Secondary | ICD-10-CM | POA: Insufficient documentation

## 2016-11-04 DIAGNOSIS — F329 Major depressive disorder, single episode, unspecified: Secondary | ICD-10-CM | POA: Insufficient documentation

## 2016-11-04 DIAGNOSIS — L570 Actinic keratosis: Secondary | ICD-10-CM | POA: Insufficient documentation

## 2016-11-04 DIAGNOSIS — J438 Other emphysema: Secondary | ICD-10-CM

## 2016-11-04 DIAGNOSIS — J309 Allergic rhinitis, unspecified: Secondary | ICD-10-CM | POA: Insufficient documentation

## 2016-11-04 DIAGNOSIS — K5730 Diverticulosis of large intestine without perforation or abscess without bleeding: Secondary | ICD-10-CM | POA: Diagnosis not present

## 2016-11-04 DIAGNOSIS — G8929 Other chronic pain: Secondary | ICD-10-CM | POA: Insufficient documentation

## 2016-11-04 DIAGNOSIS — I700 Atherosclerosis of aorta: Secondary | ICD-10-CM | POA: Diagnosis not present

## 2016-11-04 DIAGNOSIS — I313 Pericardial effusion (noninflammatory): Secondary | ICD-10-CM | POA: Diagnosis not present

## 2016-11-04 DIAGNOSIS — M48061 Spinal stenosis, lumbar region without neurogenic claudication: Secondary | ICD-10-CM | POA: Insufficient documentation

## 2016-11-04 DIAGNOSIS — K8020 Calculus of gallbladder without cholecystitis without obstruction: Secondary | ICD-10-CM | POA: Insufficient documentation

## 2016-11-04 DIAGNOSIS — Z79899 Other long term (current) drug therapy: Secondary | ICD-10-CM | POA: Insufficient documentation

## 2016-11-04 DIAGNOSIS — E8801 Alpha-1-antitrypsin deficiency: Secondary | ICD-10-CM | POA: Insufficient documentation

## 2016-11-04 DIAGNOSIS — Z87891 Personal history of nicotine dependence: Secondary | ICD-10-CM | POA: Insufficient documentation

## 2016-11-04 DIAGNOSIS — J9601 Acute respiratory failure with hypoxia: Secondary | ICD-10-CM | POA: Diagnosis not present

## 2016-11-04 DIAGNOSIS — J449 Chronic obstructive pulmonary disease, unspecified: Secondary | ICD-10-CM

## 2016-11-04 DIAGNOSIS — B359 Dermatophytosis, unspecified: Secondary | ICD-10-CM | POA: Insufficient documentation

## 2016-11-04 DIAGNOSIS — I252 Old myocardial infarction: Secondary | ICD-10-CM | POA: Insufficient documentation

## 2016-11-04 DIAGNOSIS — Z85118 Personal history of other malignant neoplasm of bronchus and lung: Secondary | ICD-10-CM | POA: Insufficient documentation

## 2016-11-04 DIAGNOSIS — J439 Emphysema, unspecified: Secondary | ICD-10-CM | POA: Diagnosis present

## 2016-11-04 DIAGNOSIS — Z888 Allergy status to other drugs, medicaments and biological substances status: Secondary | ICD-10-CM | POA: Insufficient documentation

## 2016-11-04 DIAGNOSIS — K760 Fatty (change of) liver, not elsewhere classified: Secondary | ICD-10-CM | POA: Insufficient documentation

## 2016-11-04 DIAGNOSIS — E559 Vitamin D deficiency, unspecified: Secondary | ICD-10-CM | POA: Insufficient documentation

## 2016-11-04 DIAGNOSIS — N200 Calculus of kidney: Secondary | ICD-10-CM | POA: Insufficient documentation

## 2016-11-04 DIAGNOSIS — J9600 Acute respiratory failure, unspecified whether with hypoxia or hypercapnia: Secondary | ICD-10-CM | POA: Diagnosis present

## 2016-11-04 DIAGNOSIS — Z823 Family history of stroke: Secondary | ICD-10-CM | POA: Insufficient documentation

## 2016-11-04 DIAGNOSIS — M5126 Other intervertebral disc displacement, lumbar region: Secondary | ICD-10-CM | POA: Insufficient documentation

## 2016-11-04 DIAGNOSIS — R5382 Chronic fatigue, unspecified: Secondary | ICD-10-CM | POA: Insufficient documentation

## 2016-11-04 DIAGNOSIS — J441 Chronic obstructive pulmonary disease with (acute) exacerbation: Secondary | ICD-10-CM | POA: Diagnosis not present

## 2016-11-04 DIAGNOSIS — D039 Melanoma in situ, unspecified: Secondary | ICD-10-CM | POA: Insufficient documentation

## 2016-11-04 DIAGNOSIS — Z801 Family history of malignant neoplasm of trachea, bronchus and lung: Secondary | ICD-10-CM | POA: Insufficient documentation

## 2016-11-04 DIAGNOSIS — M545 Low back pain, unspecified: Secondary | ICD-10-CM

## 2016-11-04 DIAGNOSIS — Z7982 Long term (current) use of aspirin: Secondary | ICD-10-CM | POA: Insufficient documentation

## 2016-11-04 DIAGNOSIS — Z9842 Cataract extraction status, left eye: Secondary | ICD-10-CM | POA: Insufficient documentation

## 2016-11-04 DIAGNOSIS — N400 Enlarged prostate without lower urinary tract symptoms: Secondary | ICD-10-CM | POA: Insufficient documentation

## 2016-11-04 DIAGNOSIS — R0602 Shortness of breath: Secondary | ICD-10-CM | POA: Diagnosis not present

## 2016-11-04 DIAGNOSIS — I4510 Unspecified right bundle-branch block: Secondary | ICD-10-CM | POA: Diagnosis not present

## 2016-11-04 DIAGNOSIS — R2681 Unsteadiness on feet: Secondary | ICD-10-CM | POA: Insufficient documentation

## 2016-11-04 LAB — CBC
HEMATOCRIT: 43.8 % (ref 39.0–52.0)
HEMOGLOBIN: 14.3 g/dL (ref 13.0–17.0)
MCH: 29.5 pg (ref 26.0–34.0)
MCHC: 32.6 g/dL (ref 30.0–36.0)
MCV: 90.5 fL (ref 78.0–100.0)
Platelets: 236 10*3/uL (ref 150–400)
RBC: 4.84 MIL/uL (ref 4.22–5.81)
RDW: 13.9 % (ref 11.5–15.5)
WBC: 8.6 10*3/uL (ref 4.0–10.5)

## 2016-11-04 LAB — COMPREHENSIVE METABOLIC PANEL
ALBUMIN: 4 g/dL (ref 3.5–5.0)
ALK PHOS: 114 U/L (ref 38–126)
ALT: 24 U/L (ref 17–63)
ANION GAP: 10 (ref 5–15)
AST: 31 U/L (ref 15–41)
BILIRUBIN TOTAL: 1.1 mg/dL (ref 0.3–1.2)
BUN: 12 mg/dL (ref 6–20)
CALCIUM: 9.3 mg/dL (ref 8.9–10.3)
CO2: 27 mmol/L (ref 22–32)
Chloride: 103 mmol/L (ref 101–111)
Creatinine, Ser: 0.81 mg/dL (ref 0.61–1.24)
GFR calc Af Amer: >60 mL/min (ref >60)
GFR calc non Af Amer: >60 mL/min (ref >60)
GLUCOSE: 103 mg/dL — AB (ref 65–99)
Potassium: 4.4 mmol/L (ref 3.5–5.1)
SODIUM: 140 mmol/L (ref 135–145)
TOTAL PROTEIN: 7.4 g/dL (ref 6.5–8.1)

## 2016-11-04 LAB — URINALYSIS, ROUTINE W REFLEX MICROSCOPIC
BILIRUBIN URINE: NEGATIVE
Glucose, UA: NEGATIVE mg/dL
HGB URINE DIPSTICK: NEGATIVE
KETONES UR: NEGATIVE mg/dL
Leukocytes, UA: NEGATIVE
Nitrite: NEGATIVE
PH: 7 (ref 5.0–8.0)
Protein, ur: NEGATIVE mg/dL
SPECIFIC GRAVITY, URINE: 1.015 (ref 1.005–1.030)

## 2016-11-04 LAB — LIPASE, BLOOD: Lipase: 36 U/L (ref 11–51)

## 2016-11-04 LAB — TROPONIN I: Troponin I: 0.03 ng/mL (ref <0.03)

## 2016-11-04 MED ORDER — VITAMIN D 1000 UNITS PO TABS
2000.0000 [IU] | ORAL_TABLET | Freq: Every day | ORAL | Status: DC
  Administered 2016-11-04 – 2016-11-05 (×2): 2000 [IU] via ORAL
  Filled 2016-11-04 (×2): qty 2

## 2016-11-04 MED ORDER — ALBUTEROL SULFATE (2.5 MG/3ML) 0.083% IN NEBU
2.5000 mg | INHALATION_SOLUTION | Freq: Three times a day (TID) | RESPIRATORY_TRACT | Status: DC
  Administered 2016-11-05 (×2): 2.5 mg via RESPIRATORY_TRACT
  Filled 2016-11-04 (×2): qty 3

## 2016-11-04 MED ORDER — BUPROPION HCL ER (SR) 150 MG PO TB12
150.0000 mg | ORAL_TABLET | Freq: Every day | ORAL | Status: DC
  Administered 2016-11-04 – 2016-11-05 (×2): 150 mg via ORAL
  Filled 2016-11-04 (×2): qty 1

## 2016-11-04 MED ORDER — SODIUM CHLORIDE 0.9 % IV SOLN: INTRAVENOUS | Status: DC

## 2016-11-04 MED ORDER — IPRATROPIUM-ALBUTEROL 0.5-2.5 (3) MG/3ML IN SOLN
3.0000 mL | Freq: Once | RESPIRATORY_TRACT | Status: AC
  Administered 2016-11-04: 3 mL via RESPIRATORY_TRACT
  Filled 2016-11-04: qty 3

## 2016-11-04 MED ORDER — METOPROLOL TARTRATE 12.5 MG HALF TABLET
12.5000 mg | ORAL_TABLET | Freq: Two times a day (BID) | ORAL | Status: DC
  Administered 2016-11-04 – 2016-11-05 (×2): 12.5 mg via ORAL
  Filled 2016-11-04 (×2): qty 1

## 2016-11-04 MED ORDER — ACETAMINOPHEN 650 MG RE SUPP: 650.0000 mg | Freq: Four times a day (QID) | RECTAL | Status: DC | PRN

## 2016-11-04 MED ORDER — ONDANSETRON HCL 4 MG/2ML IJ SOLN: 4.0000 mg | Freq: Four times a day (QID) | INTRAMUSCULAR | Status: DC | PRN

## 2016-11-04 MED ORDER — TEMAZEPAM 15 MG PO CAPS: 30.0000 mg | ORAL_CAPSULE | Freq: Every evening | ORAL | Status: DC | PRN

## 2016-11-04 MED ORDER — FUROSEMIDE 20 MG PO TABS
20.0000 mg | ORAL_TABLET | Freq: Every day | ORAL | Status: DC
  Administered 2016-11-05: 20 mg via ORAL
  Filled 2016-11-04: qty 1

## 2016-11-04 MED ORDER — ACETAMINOPHEN 325 MG PO TABS: 650.0000 mg | ORAL_TABLET | Freq: Four times a day (QID) | ORAL | Status: DC | PRN

## 2016-11-04 MED ORDER — SODIUM CHLORIDE 0.9 % IV SOLN
INTRAVENOUS | Status: AC
  Administered 2016-11-04: 16:00:00 via INTRAVENOUS

## 2016-11-04 MED ORDER — ALBUTEROL SULFATE (2.5 MG/3ML) 0.083% IN NEBU
2.5000 mg | INHALATION_SOLUTION | RESPIRATORY_TRACT | Status: DC
  Administered 2016-11-04 (×2): 2.5 mg via RESPIRATORY_TRACT
  Filled 2016-11-04 (×2): qty 3

## 2016-11-04 MED ORDER — GI COCKTAIL ~~LOC~~
30.0000 mL | Freq: Once | ORAL | Status: AC
  Administered 2016-11-04: 30 mL via ORAL
  Filled 2016-11-04: qty 30

## 2016-11-04 MED ORDER — VENLAFAXINE HCL ER 75 MG PO CP24
150.0000 mg | ORAL_CAPSULE | Freq: Every day | ORAL | Status: DC
  Administered 2016-11-04 – 2016-11-05 (×2): 150 mg via ORAL
  Filled 2016-11-04: qty 1
  Filled 2016-11-04: qty 2

## 2016-11-04 MED ORDER — ENOXAPARIN SODIUM 40 MG/0.4ML ~~LOC~~ SOLN
40.0000 mg | SUBCUTANEOUS | Status: DC
  Administered 2016-11-04: 40 mg via SUBCUTANEOUS
  Filled 2016-11-04 (×2): qty 0.4

## 2016-11-04 MED ORDER — ONDANSETRON HCL 4 MG PO TABS: 4.0000 mg | ORAL_TABLET | Freq: Four times a day (QID) | ORAL | Status: DC | PRN

## 2016-11-04 MED ORDER — MAGNESIUM HYDROXIDE 400 MG/5ML PO SUSP
30.0000 mL | Freq: Every day | ORAL | Status: AC
  Administered 2016-11-04 – 2016-11-05 (×2): 30 mL via ORAL
  Filled 2016-11-04 (×2): qty 30

## 2016-11-04 MED ORDER — IOPAMIDOL (ISOVUE-300) INJECTION 61%
INTRAVENOUS | Status: AC
  Administered 2016-11-04: 100 mL
  Filled 2016-11-04: qty 100

## 2016-11-04 MED ORDER — ATORVASTATIN CALCIUM 20 MG PO TABS
20.0000 mg | ORAL_TABLET | Freq: Every day | ORAL | Status: DC
  Administered 2016-11-04 – 2016-11-05 (×2): 20 mg via ORAL
  Filled 2016-11-04 (×2): qty 1

## 2016-11-04 MED ORDER — METHYLPREDNISOLONE SODIUM SUCC 125 MG IJ SOLR
60.0000 mg | Freq: Two times a day (BID) | INTRAMUSCULAR | Status: DC
  Administered 2016-11-05: 60 mg via INTRAVENOUS
  Filled 2016-11-04 (×2): qty 2

## 2016-11-04 MED ORDER — METHYLPREDNISOLONE SODIUM SUCC 125 MG IJ SOLR
125.0000 mg | Freq: Once | INTRAMUSCULAR | Status: AC
  Administered 2016-11-04: 125 mg via INTRAVENOUS
  Filled 2016-11-04: qty 2

## 2016-11-04 MED ORDER — GUAIFENESIN ER 600 MG PO TB12
600.0000 mg | ORAL_TABLET | Freq: Two times a day (BID) | ORAL | Status: DC
  Administered 2016-11-04 – 2016-11-05 (×2): 600 mg via ORAL
  Filled 2016-11-04 (×2): qty 1

## 2016-11-04 MED ORDER — ASPIRIN EC 81 MG PO TBEC
81.0000 mg | DELAYED_RELEASE_TABLET | Freq: Every day | ORAL | Status: DC
  Administered 2016-11-05: 81 mg via ORAL
  Filled 2016-11-04 (×2): qty 1

## 2016-11-04 MED ORDER — DUTASTERIDE 0.5 MG PO CAPS
0.5000 mg | ORAL_CAPSULE | Freq: Every day | ORAL | Status: DC
  Administered 2016-11-04 – 2016-11-05 (×2): 0.5 mg via ORAL
  Filled 2016-11-04 (×2): qty 1

## 2016-11-04 MED ORDER — ALBUTEROL SULFATE (2.5 MG/3ML) 0.083% IN NEBU: 2.5000 mg | INHALATION_SOLUTION | RESPIRATORY_TRACT | Status: DC | PRN

## 2016-11-04 MED ORDER — TAMSULOSIN HCL 0.4 MG PO CAPS
0.4000 mg | ORAL_CAPSULE | Freq: Every day | ORAL | Status: DC
  Administered 2016-11-04: 0.4 mg via ORAL
  Filled 2016-11-04: qty 1

## 2016-11-04 MED ORDER — FAMOTIDINE 20 MG PO TABS
10.0000 mg | ORAL_TABLET | Freq: Every day | ORAL | Status: DC
  Administered 2016-11-05: 10 mg via ORAL
  Filled 2016-11-04: qty 1

## 2016-11-04 NOTE — ED Notes (Signed)
Pt. Given [place] sandwich at this time.

## 2016-11-04 NOTE — ED Notes (Signed)
Pt was able to ambulate with no assistance and with the help of his cane. Pt started at 93% SPO2 and within 2 minutes he dropped to 77% and maintained 76-79 until he made it back to his room. He reported being SOB and was only able to speak 4 words at once.

## 2016-11-04 NOTE — ED Notes (Signed)
Troponin 0.03 per main lab

## 2016-11-04 NOTE — H&P (Signed)
History and Physical    TAVIEN CHESTNUT JHE:[id number] DOB: 1935/02/06 DOA: 11/04/2016  PCP: [name], MD Patient coming from: home  Chief Complaint: sob/cough  HPI: [name] Fields is a very pleasant very HOH 82 y.o. male with medical history significant for COPD not on home oxygen, pulmonary fibrosis, CAD, hypertension, hyperlipidemia, lung cancer since emergency Department chief complaint worsening shortness of breath as well as worsening cough and increased sputum production. Initial evaluation reveals acute respiratory failure with hypoxia with ambulation likely related to COPD exacerbation.  Information is obtained from the chart and the patient. Patient states he's had intermittent worsening shortness of breath over the last 2 weeks. Associated symptoms include wheezing with worsening cough. He does not have home oxygen. He states that "some people think I should". He states he's been prescribed inhalers but he doesn't use them as they are ineffective. He denies headache dizziness syncope or near-syncope however he does endorse a history of vertigo. He denies fever chills nausea vomiting diarrhea. He does also complain of some epigastric pain last night. He denies diaphoresis dysuria hematuria frequency or urgency. He states he has a lung doctor who he is due to see next month.    ED Course: In emergency department is afebrile hemodynamically stable mildly to. Oxygen saturation levels dropped to 77% with ambulation. He is provided with nebulizer and 125 mg of Solu-Medrol. At the time of admission oxygen saturation level 89% on room air at rest. Respiratory effort is within the limits of normal  Review of Systems: As per HPI otherwise all other systems reviewed and are negative.   Ambulatory Status: He ambulates with a cane with a somewhat unsteady gait. He is independent with ADLs  Past Medical History:  Diagnosis Date  . Anxiety    takes wellbutrin daily  . Arthritis    knees  . COPD (chronic obstructive pulmonary disease) ([place])   . Diverticulosis   . Emphysema of lung ([place])   . GERD (gastroesophageal reflux disease)   . Hearing loss   . Hx of radiation therapy 09/29/11;10/01/11;10/03/11;10/06/11;10/08/11;   RLLlung,50Gy/15fx - SBRT  . Hyperlipidemia   . Hypertension   . Hypokinesia    MILD INFERIOR  . IHD (ischemic heart disease)    Remote MI in 2001 with stent to the LCX  . Lactose intolerance   . Non-small cell carcinoma of lung ([place])    Dx in October of 2010  . Normal nuclear stress test 2009   EF 63%. No ischemia. 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  . Pericardial effusion July 2011   s/p pericardial window; last echo in August 2012 with minimal effusion  . Pleural effusion, left   . SOB (shortness of breath)    Chronic with known fibrosis  . Status post chemotherapy 06/04/2009 -  09/06/2009    4 cycles of Cisplatin and Gemcitabine with Neulasta Support   . Vertigo    hx. of- ambulates with cane for safety.    Past Surgical History:  Procedure Laterality Date  . CARDIAC CATHETERIZATION  07/11/2003   EF 60%, REPEAT, WHICH SHOWED 30% PROXIMAL NARROWING IN THE RIGHT  CORONARY ARTERY  . CARDIOVASCULAR STRESS TEST  05/2007   EF 63%  . CATARACT EXTRACTION, BILATERAL Bilateral   . COLONOSCOPY WITH PROPOFOL N/A 01/30/2015   Procedure: COLONOSCOPY WITH PROPOFOL;  Surgeon: Garlan Fair, MD;  Location: [facility];  Service: Endoscopy;  Laterality: N/A;  . CORONARY STENT PLACEMENT  07/1999   STENT TO THE LEFT CIRCUMFLEX  .  HERNIA REPAIR     as an adult- R inguinal   . PERICARDIAL WINDOW  July 2011  . TRANSTHORACIC ECHOCARDIOGRAM  02/22/2010   EF 55-60%    Social History   Social History  . Marital status: Single    Spouse name: N/A  . Number of children: N/A  . Years of education: N/A   Occupational History  . retired Retired    [employer]   Social History Main Topics  . Smoking status: Former Smoker    Packs/day: 3.00    Years: 40.00    Types:  Cigarettes    Quit date: 04/07/1994  . Smokeless tobacco: Never Used  . Alcohol use No  . Drug use: No  . Sexual activity: No   Other Topics Concern  . Not on file   Social History Narrative   Originally from [place]. Previously worked for [employer]. Currently has 2 dogs. No bird or mold exposure.     Allergies  Allergen Reactions  . Sertraline Hcl Other (See Comments)    Patient does not remember the reaction    Family History  Problem Relation Age of Onset  . Lung cancer Father   . Stroke Sister   . Heart disease Neg Hx   . Anesthesia problems Neg Hx   . Lung disease Neg Hx     Prior to Admission medications   Medication Sig Start Date End Date Taking? Authorizing Provider  aspirin EC 81 MG tablet Take 81 mg by mouth daily.    [provider]  atorvastatin (LIPITOR) 20 MG tablet TAKE 1 TABLET BY MOUTH DAILY 10/05/14   Jerline Pain, MD  buPROPion [facility] SR) 150 MG 12 hr tablet Take 150 mg by mouth daily.     [provider]  Cholecalciferol (VITAMIN D3) 2000 units TABS Take 2,000 Units by mouth daily.    [provider]  dutasteride (AVODART) 0.5 MG capsule Take 0.5 mg by mouth daily.  12/02/15   [provider]  furosemide (LASIX) 20 MG tablet Take 1 tablet (20 mg total) by mouth daily. 01/29/16   Burtis Junes, NP  metoprolol tartrate (LOPRESSOR) 25 MG tablet TAKE 1/2 TABLET BY MOUTH TWICE DAILY 01/23/16   Jerline Pain, MD  ranitidine (ZANTAC) 150 MG capsule Take 150 mg by mouth every evening.    [provider]  Tamsulosin HCl (FLOMAX) 0.4 MG CAPS Take 0.4 mg by mouth daily with supper.     [provider]  temazepam (RESTORIL) 30 MG capsule 1 capsule at bedtime    [provider]  venlafaxine XR (EFFEXOR-XR) 150 MG 24 hr capsule Take 1 capsule by mouth daily. 12/04/14   [provider]    Physical Exam: Vitals:   11/04/16 1415 11/04/16 1430 11/04/16 1445 11/04/16 1500  BP: (!) 150/87 (!) 151/89  (!) 153/84 (!) 154/81  Pulse: 78 86 78 81  Resp: 17 20 19    Temp:      TempSrc:      SpO2: 97% 99% 96% 98%     General:  Appears calm and comfortable, slightly pale but in no acute distress Eyes:  PERRL, EOMI, normal lids, iris ENT:  grossly normal hearing, lips & tongue, mucous membranes of his mouth are dry Neck:  no LAD, masses or thyromegaly Cardiovascular:  RRR, no m/r/g. No LE edema. Pedal pulses present and palpable Respiratory:  Normal effort. Breath sounds are quite distant with decreased air flow. faint end-expiratory wheezing as well as diffuse  coarseness Abdomen:  Slightly firm ntnd, positive bowel sounds throughout no guarding or rebounding Skin:  no rash or induration seen on limited exam Musculoskeletal:  grossly normal tone BUE/BLE, good ROM, no bony abnormality Psychiatric:  grossly normal mood and affect, speech fluent and appropriate, AOx3 Neurologic:  CN 2-12 grossly intact, moves all extremities in coordinated fashion, sensation intact  Labs on Admission: I have personally reviewed following labs and imaging studies  CBC:  Recent Labs Lab 11/04/16 1011  WBC 8.6  HGB 14.3  HCT 43.8  MCV 90.5  PLT 258   Basic Metabolic Panel:  Recent Labs Lab 11/04/16 1011  NA 140  K 4.4  CL 103  CO2 27  GLUCOSE 103*  BUN 12  CREATININE 0.81  CALCIUM 9.3   GFR: CrCl cannot be calculated (Unknown ideal weight.). Liver Function Tests:  Recent Labs Lab 11/04/16 1011  AST 31  ALT 24  ALKPHOS 114  BILITOT 1.1  PROT 7.4  ALBUMIN 4.0    Recent Labs Lab 11/04/16 1011  LIPASE 36   No results for input(s): AMMONIA in the last 168 hours. Coagulation Profile: No results for input(s): INR, PROTIME in the last 168 hours. Cardiac Enzymes:  Recent Labs Lab 11/04/16 1414  TROPONINI 0.03*   BNP (last 3 results) No results for input(s): PROBNP in the last 8760 hours. HbA1C: No results for input(s): HGBA1C in the last 72 hours. CBG: No results for  input(s): GLUCAP in the last 168 hours. Lipid Profile: No results for input(s): CHOL, HDL, LDLCALC, TRIG, CHOLHDL, LDLDIRECT in the last 72 hours. Thyroid Function Tests: No results for input(s): TSH, T4TOTAL, FREET4, T3FREE, THYROIDAB in the last 72 hours. Anemia Panel: No results for input(s): VITAMINB12, FOLATE, FERRITIN, TIBC, IRON, RETICCTPCT in the last 72 hours. Urine analysis:    Component Value Date/Time   COLORURINE YELLOW 11/04/2016 1128   APPEARANCEUR CLEAR 11/04/2016 1128   LABSPEC 1.015 11/04/2016 1128   PHURINE 7.0 11/04/2016 1128   GLUCOSEU NEGATIVE 11/04/2016 1128   HGBUR NEGATIVE 11/04/2016 1128   BILIRUBINUR NEGATIVE 11/04/2016 1128   KETONESUR NEGATIVE 11/04/2016 1128   PROTEINUR NEGATIVE 11/04/2016 1128   UROBILINOGEN 0.2 12/19/2014 2330   NITRITE NEGATIVE 11/04/2016 1128   LEUKOCYTESUR NEGATIVE 11/04/2016 1128    Creatinine Clearance: CrCl cannot be calculated (Unknown ideal weight.).  Sepsis Labs: @LABRCNTIP (procalcitonin:4,lacticidven:4) )No results found for this or any previous visit (from the past 240 hour(s)).   Radiological Exams on Admission: Dg Chest 2 View  Result Date: 11/04/2016 CLINICAL DATA:  Chronic chest pain and shortness of breath, presenting with acute worsening of shortness of breath. Personal history of right lower lobe lung cancer. One hundred twenty pack-year smoking history. Chronic left pleural effusion. EXAM: CHEST  2 VIEW COMPARISON:  CT chest 10/29/2015 and earlier. Chest x-rays 10/29/2015 and earlier. FINDINGS: AP semi-erect and lateral images were obtained. Cardiac silhouette mildly enlarged, unchanged. Thoracic aorta tortuous and atherosclerotic, unchanged. Hilar and mediastinal contours otherwise unremarkable. Chronic loculated left pleural effusion at the apex and base, unchanged, with associated chronic passive atelectasis involving the left lower lobe and to a lesser degree the left upper lobe. Stable post radiation fibrosis  involving the right lower lobe. No new pulmonary parenchymal abnormalities in the right lung. Right jugular Port-A-Cath tip in the lower SVC, unchanged. Mild degenerative changes involving the thoracic and upper lumbar spine. IMPRESSION: 1.  No acute cardiopulmonary disease. 2. Stable chronic loculated left pleural effusion and chronic passive atelectasis in the left lower lobe and  left upper lobe. 3. 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 4.  Aortic Atherosclerosis (ICD10-170.0) Electronically Signed   By: Evangeline Dakin M.D.   On: 11/04/2016 11:29   Ct Abdomen Pelvis W Contrast  Result Date: 11/04/2016 CLINICAL DATA:  Abdominal pain and nausea. History of lung carcinoma EXAM: CT ABDOMEN AND PELVIS WITH CONTRAST TECHNIQUE: Multidetector CT imaging of the abdomen and pelvis was performed using the standard protocol following bolus administration of intravenous contrast. CONTRAST:  132mL ISOVUE-300 IOPAMIDOL (ISOVUE-300) INJECTION 61% COMPARISON:  PET-CT August 05, 2011 FINDINGS: Lower chest: There is scarring in the lung bases, with loculated fluid and pleural thickening in the left base. There is a focal pericardial effusion. There are foci of coronary artery calcification. Hepatobiliary: There is hepatic steatosis. No focal liver lesions are evident. There is cholelithiasis. Gallbladder wall does not appear appreciably thickened. No biliary duct dilatation. Pancreas: No pancreatic mass or inflammatory focus. There is fatty infiltration within the pancreas. Spleen: No splenic lesions are evident. Adrenals/Urinary Tract: Adrenals appear unremarkable bilaterally. Kidneys bilaterally show no evident mass or hydronephrosis on either side. There is a 3 mm calculus in the upper pole of the right kidney, nonobstructing. No ureteral calculi are evident. Urinary bladder is midline with wall thickness within normal limits. Stomach/Bowel: There are multiple sigmoid and descending colonic  diverticula without diverticulitis. There is no appreciable bowel wall or mesenteric thickening. There is no bowel obstruction. No free air or portal venous air. Vascular/Lymphatic: There is atherosclerotic calcification in the aorta and iliac arteries. No aneurysm evident. There is moderate calcification in the proximal mesenteric arteries without mesenteric artery obstruction. There is calcification at the origin of the left and right renal arteries with apparent hemodynamically significant obstruction in these regions. There is no adenopathy appreciable in the abdomen or pelvis. Reproductive: Prostate and seminal vesicles are normal in size and contour. There are prostatic calculi inferiorly. No pelvic mass evident. Other: Appendix appears unremarkable. No abscess or ascites is evident in the abdomen or pelvis. There is a small ventral hernia containing only fat. There is a small ventral hernia containing only fat more superiorly, approximately 15 cm superior to the umbilicus. There is fat in each inguinal ring region. Musculoskeletal: There is degenerative change in the lumbar spine. There is spinal stenosis at L4-5 due to bony hypertrophy and diffuse disc protrusion. There is also ligamentum flava hypertrophy bilaterally at this level. There are no blastic or lytic bone lesions. No intramuscular or abdominal wall lesion evident. IMPRESSION: 1. Extensive left-sided colonic diverticula without diverticulitis. No bowel obstruction. No abscess. Appendix appears normal. 2. Pericardial effusion. Loculated fluid left lung base. Bibasilar lung scarring. 3.  No renal or ureteral calculus.  No hydronephrosis. 4.  Cholelithiasis. 5.  Hepatic steatosis.  No focal liver lesion evident. 6. No renal or ureteral calculus. No hydronephrosis. There are prostatic calculi bilaterally. 7.  Small ventral hernias containing only fat. 8. Extensive aortoiliac atherosclerosis. Calcification is noted in the major mesenteric vessels.  There appears to be hemodynamically significant obstructive disease due to calcification in both proximal renal arteries. In this regard, question whether patient is hypertensive. 9.  Spinal stenosis at L4-5, multifactorial in etiology. Aortic Atherosclerosis (ICD10-I70.0). Electronically Signed   By: Lowella Grip III M.D.   On: 11/04/2016 13:33    EKG: Independently reviewed. Sinus rhythm with 1st degree A-V block Incomplete right bundle branch block Right ventricular hypertrophy Septal infarct , age undetermined Abnormal ECG No significant change since last tracing  Assessment/Plan Principal Problem:   Acute respiratory failure (HCC) Active Problems:   Cancer of lower lobe of right lung (HCC)   COPD (chronic obstructive pulmonary disease) with emphysema (HCC)   Pericardial effusion   HTN (hypertension)   GERD (gastroesophageal reflux disease)   Atherosclerotic heart disease of native coronary artery without angina pectoris   CN (constipation)   COPD with exacerbation (HCC)   Vertigo   Chest pain   #1. Acute respiratory failure with hypoxia likely related to COPD exacerbation. Trigger unclear. Patient not on home oxygen and does not use inhalers or nebulizers at home either. Oxygen saturation level dropped to 77% with ambulation while in the emergency department. Chest x-ray reveals no acute cardiopulmonary disease. Stable chronic loculated left pleural effusion and chronic passive atelectasis in the left lower lobe and left upper lobe. 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 He is provided with nebulizers and Solu-Medrol in the emergency department. -Admit to telemetry -Continue scheduled nebulizers -Solu-Medrol 60 mg every 12 -Flutter valve -Ambulate tomorrow documenting respiratory effort and oxygen saturation level -Hopefully discharged tomorrow may need home oxygen initially -Outpatient follow-up with his pulmonologist next month  #2. COPD with  emphysema/moderate restrictive lung disease. Chart review indicates patient saw pulmonology in March of this year. Office note dictates plan for repeat spirometry with bronchodilator challenge at next appointment which is in September. Office note also indicates no need for further testing regarding his restrictive lung disease. -See #1  #3. GERD. Stable at baseline -Continue home meds  #4. Hypertension. Only fair control in the emergency department. Home medications include Lasix, Lopressor -We'll hold Lasix until tomorrow -Continue Lopressor -Monitor  #5. Chest pain. Heart score 4. History of CAD. Patient describes epigastric pain last night. Initial troponins negative. EKG as noted above. Lipase within limits of normal. Pain free on admission -Cycle troponin -Serial EKG -Continue aspirin -Continue statin  #6. Cancer the right lower lobe. Completed treatment in over a year ago. Status post chemotherapy and radiation. He does have moderate restrictive disease secondary to radiation. -Follow-up with oncology as indicated  #7. Constipation. Patient complains of chronic constipation/diarrhea concerning for irritable bowel disease. CT shows some stool -Milk of magnesia    DVT prophylaxis: lovenox  Code Status: full  Family Communication: none present  Disposition Plan: home hopefully 24 hours  Consults called: none  Admission status: obs    Dyanne Carrel M MD [facility]  If 7PM-7AM, please contact night-coverage [email or web address] Password [facility]  11/04/2016, 3:18 PM

## 2016-11-04 NOTE — ED Notes (Signed)
ED Provider at bedside. 

## 2016-11-04 NOTE — ED Notes (Signed)
Attempted report 

## 2016-11-04 NOTE — ED Provider Notes (Signed)
[name] Provider Note   CSN: [id number] Arrival date & time: 11/04/16  0911   History   Chief Complaint Chief Complaint  Patient presents with  . Shortness of Breath  . Back Pain  . Abdominal Pain    HPI [name] is a 82 y.o. male who presents with worsening SOB, abdominal swelling, and mid back pain. He is accompanied by his sister today. PMHx of lung cancer which is in remision, COPD, pulmonary fibrosis, CAD, HTN, HLD,   SOB - chronic but worsening over the past 2 weeks. Associated with wheezing and worsening dry cough. Pulmonologist is Dr. Tera Partridge. He has tried inhalers in the past with no relief. They helped like a "hill of beans". He has never been on O2. Lung cancer (Non-SCC) is in remission. He has been off chemoradiation for >1 year. Previously followed by Dr. Marin Olp. No chest pain, palpitations, leg swelling. Worse with getting up out of chair and certain movements.  Abdominal pain/distension - worsening over 1 week. Constant. Feels like being "blown up with a pump". Previously diagnosed with a hiatal hernia. Not worse with eating. No fever, chills, N/V/D. Feels more constipated but had a BM today and is passing gas. No known past abdominal surgeries. No hx of EGD.  Back pain - started yesterday. Low, midline. Sometimes radiates to legs and has numbness/tingling.  HPI  Past Medical History:  Diagnosis Date  . Anxiety    takes wellbutrin daily  . Arthritis    knees  . COPD (chronic obstructive pulmonary disease) ([place])   . Diverticulosis   . Emphysema of lung ([place])   . GERD (gastroesophageal reflux disease)   . Hearing loss   . Hx of radiation therapy 09/29/11;10/01/11;10/03/11;10/06/11;10/08/11;   RLLlung,50Gy/22fx - SBRT  . Hyperlipidemia   . Hypertension   . Hypokinesia    MILD INFERIOR  . IHD (ischemic heart disease)    Remote MI in 2001 with stent to the LCX  . Lactose intolerance   . Non-small cell carcinoma of lung ([place])    Dx in  October of 2010  . Normal nuclear stress test 2009   EF 63%. No ischemia. 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  . Pericardial effusion July 2011   s/p pericardial window; last echo in August 2012 with minimal effusion  . Pleural effusion, left   . SOB (shortness of breath)    Chronic with known fibrosis  . Status post chemotherapy 06/04/2009 -  09/06/2009    4 cycles of Cisplatin and Gemcitabine with Neulasta Support   . Vertigo    hx. of- ambulates with cane for safety.    Patient Active Problem List   Diagnosis Date Noted  . Alpha-1-antitrypsin deficiency carrier ([place]) 03/20/2016  . Restrictive lung disease 12/18/2015  . Actinic keratoses 07/17/2015  . Allergic rhinitis 07/17/2015  . Atherosclerotic heart disease of native coronary artery without angina pectoris 07/17/2015  . Basal cell carcinoma of skin of trunk 07/17/2015  . CFIDS (chronic fatigue and immune dysfunction syndrome) ([place]) 07/17/2015  . CN (constipation) 07/17/2015  . Deltoid tendinitis 07/17/2015  . Enlarged prostate 07/17/2015  . Gastric irritation 07/17/2015  . Cephalalgia 07/17/2015  . LAFB (left anterior fascicular block) 07/17/2015  . Major depressive disorder with single episode 07/17/2015  . Avitaminosis D 07/17/2015  . Hemorrhoids without complication 67/03/4579  . Body tinea 07/17/2015  . Arthralgia of shoulder 07/17/2015  . Arthralgia of lower leg 07/17/2015  . Arthritis of knee, degenerative 07/17/2015  . Melanoma in situ (  [place]) 07/17/2015  . ED (erectile dysfunction) of organic origin 07/17/2015  . Breathlessness on exertion 06/25/2015  . Non-small cell lung cancer ([place]) 12/21/2014  . Thrombocytopenia ([place]) 12/19/2014  . HTN (hypertension) 12/19/2014  . GERD (gastroesophageal reflux disease) 12/19/2014  . Skin lesion of face 04/19/2013  . Old myocardial infarction 01/27/2013  . Pulmonary nodule 08/27/2011  . PVC's (premature ventricular contractions) 01/13/2011  . Pericardial effusion 11/04/2010  . COPD  (chronic obstructive pulmonary disease) with emphysema ([place]) 03/04/2010  . Cancer of lower lobe of right lung ([place]) 02/06/2010  . HYPERLIPIDEMIA 02/05/2010  . MELANOMA, HX OF 02/05/2010    Past Surgical History:  Procedure Laterality Date  . CARDIAC CATHETERIZATION  07/11/2003   EF 60%, REPEAT, WHICH SHOWED 30% PROXIMAL NARROWING IN THE RIGHT  CORONARY ARTERY  . CARDIOVASCULAR STRESS TEST  05/2007   EF 63%  . CATARACT EXTRACTION, BILATERAL Bilateral   . COLONOSCOPY WITH PROPOFOL N/A 01/30/2015   Procedure: COLONOSCOPY WITH PROPOFOL;  Surgeon: Garlan Fair, MD;  Location: [facility];  Service: Endoscopy;  Laterality: N/A;  . CORONARY STENT PLACEMENT  07/1999   STENT TO THE LEFT CIRCUMFLEX  . HERNIA REPAIR     as an adult- R inguinal   . PERICARDIAL WINDOW  July 2011  . TRANSTHORACIC ECHOCARDIOGRAM  02/22/2010   EF 55-60%      Home Medications    Prior to Admission medications   Medication Sig Start Date End Date Taking? Authorizing Provider  aspirin EC 81 MG tablet Take 81 mg by mouth daily.    [provider]  atorvastatin (LIPITOR) 20 MG tablet TAKE 1 TABLET BY MOUTH DAILY 10/05/14   Jerline Pain, MD  buPROPion [facility] SR) 150 MG 12 hr tablet Take 150 mg by mouth daily.     [provider]  Cholecalciferol (VITAMIN D3) 2000 units TABS Take 2,000 Units by mouth daily.    [provider]  dutasteride (AVODART) 0.5 MG capsule Take 0.5 mg by mouth daily.  12/02/15   [provider]  furosemide (LASIX) 20 MG tablet Take 1 tablet (20 mg total) by mouth daily. 01/29/16   Burtis Junes, NP  metoprolol tartrate (LOPRESSOR) 25 MG tablet TAKE 1/2 TABLET BY MOUTH TWICE DAILY 01/23/16   Jerline Pain, MD  ranitidine (ZANTAC) 150 MG capsule Take 150 mg by mouth every evening.    [provider]  Tamsulosin HCl (FLOMAX) 0.4 MG CAPS Take 0.4 mg by mouth daily with supper.     [provider]  temazepam (RESTORIL) 30 MG capsule 1  capsule at bedtime    [provider]  venlafaxine XR (EFFEXOR-XR) 150 MG 24 hr capsule Take 1 capsule by mouth daily. 12/04/14   [provider]    Family History Family History  Problem Relation Age of Onset  . Lung cancer Father   . Stroke Sister   . Heart disease Neg Hx   . Anesthesia problems Neg Hx   . Lung disease Neg Hx     Social History Social History  Substance Use Topics  . Smoking status: Former Smoker    Packs/day: 3.00    Years: 40.00    Types: Cigarettes    Quit date: 04/07/1994  . Smokeless tobacco: Never Used  . Alcohol use No     Allergies   Sertraline hcl   Review of Systems Review of Systems  Constitutional: Negative for chills and fever.  Respiratory: Positive for cough, shortness of breath and  wheezing.   Cardiovascular: Negative for chest pain, palpitations and leg swelling.  Gastrointestinal: Positive for abdominal distention, abdominal pain and constipation. Negative for diarrhea, nausea and vomiting.  Musculoskeletal: Positive for back pain. Negative for gait problem and joint swelling.  Allergic/Immunologic: Negative for immunocompromised state.  Neurological: Negative for weakness.  All other systems reviewed and are negative.    Physical Exam Updated Vital Signs BP (!) 149/84   Pulse 81   Temp 97.7 F (36.5 C) (Oral)   Resp 18   SpO2 98%   Physical Exam  Constitutional: He is oriented to person, place, and time. He appears well-developed and well-nourished. No distress.  Obese male in NAD with audible wheezing. HOH  HENT:  Head: Normocephalic and atraumatic.  Eyes: Pupils are equal, round, and reactive to light. Conjunctivae are normal. Right eye exhibits no discharge. Left eye exhibits no discharge. No scleral icterus.  Neck: Normal range of motion.  Cardiovascular: Normal rate and regular rhythm.  Exam reveals no gallop and no friction rub.   No murmur heard. Pulmonary/Chest: Effort normal. Tachypnea noted.  No respiratory distress. He has decreased breath sounds. He has wheezes (diffuse). He has no rhonchi. He has no rales. He exhibits no tenderness.  Abdominal: Soft. Bowel sounds are normal. He exhibits no distension and no mass. There is tenderness (generalized). There is no rebound and no guarding. No hernia.  Small well-healed surgical scar over epigastric area  Neurological: He is alert and oriented to person, place, and time.  Skin: Skin is warm and dry.  Psychiatric: He has a normal mood and affect. His behavior is normal.  Nursing note and vitals reviewed.    ED Treatments / Results  Labs (all labs ordered are listed, but only abnormal results are displayed) Labs Reviewed  COMPREHENSIVE METABOLIC PANEL - Abnormal; Notable for the following:       Result Value   Glucose, Bld 103 (*)    All other components within normal limits  TROPONIN I - Abnormal; Notable for the following:    Troponin I 0.03 (*)    All other components within normal limits  LIPASE, BLOOD  CBC  URINALYSIS, ROUTINE W REFLEX MICROSCOPIC  CREATININE, SERUM  TROPONIN I    EKG  EKG Interpretation None       Radiology Dg Chest 2 View  Result Date: 11/04/2016 CLINICAL DATA:  Chronic chest pain and shortness of breath, presenting with acute worsening of shortness of breath. Personal history of right lower lobe lung cancer. One hundred twenty pack-year smoking history. Chronic left pleural effusion. EXAM: CHEST  2 VIEW COMPARISON:  CT chest 10/29/2015 and earlier. Chest x-rays 10/29/2015 and earlier. FINDINGS: AP semi-erect and lateral images were obtained. Cardiac silhouette mildly enlarged, unchanged. Thoracic aorta tortuous and atherosclerotic, unchanged. Hilar and mediastinal contours otherwise unremarkable. Chronic loculated left pleural effusion at the apex and base, unchanged, with associated chronic passive atelectasis involving the left lower lobe and to a lesser degree the left upper lobe. Stable post  radiation fibrosis involving the right lower lobe. No new pulmonary parenchymal abnormalities in the right lung. Right jugular Port-A-Cath tip in the lower SVC, unchanged. Mild degenerative changes involving the thoracic and upper lumbar spine. IMPRESSION: 1.  No acute cardiopulmonary disease. 2. Stable chronic loculated left pleural effusion and chronic passive atelectasis in the left lower lobe and left upper lobe. 3. 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 4.  Aortic Atherosclerosis (ICD10-170.0) Electronically Signed   By: Evangeline Dakin  M.D.   On: 11/04/2016 11:29   Ct Abdomen Pelvis W Contrast  Result Date: 11/04/2016 CLINICAL DATA:  Abdominal pain and nausea. History of lung carcinoma EXAM: CT ABDOMEN AND PELVIS WITH CONTRAST TECHNIQUE: Multidetector CT imaging of the abdomen and pelvis was performed using the standard protocol following bolus administration of intravenous contrast. CONTRAST:  121mL ISOVUE-300 IOPAMIDOL (ISOVUE-300) INJECTION 61% COMPARISON:  PET-CT August 05, 2011 FINDINGS: Lower chest: There is scarring in the lung bases, with loculated fluid and pleural thickening in the left base. There is a focal pericardial effusion. There are foci of coronary artery calcification. Hepatobiliary: There is hepatic steatosis. No focal liver lesions are evident. There is cholelithiasis. Gallbladder wall does not appear appreciably thickened. No biliary duct dilatation. Pancreas: No pancreatic mass or inflammatory focus. There is fatty infiltration within the pancreas. Spleen: No splenic lesions are evident. Adrenals/Urinary Tract: Adrenals appear unremarkable bilaterally. Kidneys bilaterally show no evident mass or hydronephrosis on either side. There is a 3 mm calculus in the upper pole of the right kidney, nonobstructing. No ureteral calculi are evident. Urinary bladder is midline with wall thickness within normal limits. Stomach/Bowel: There are multiple sigmoid and  descending colonic diverticula without diverticulitis. There is no appreciable bowel wall or mesenteric thickening. There is no bowel obstruction. No free air or portal venous air. Vascular/Lymphatic: There is atherosclerotic calcification in the aorta and iliac arteries. No aneurysm evident. There is moderate calcification in the proximal mesenteric arteries without mesenteric artery obstruction. There is calcification at the origin of the left and right renal arteries with apparent hemodynamically significant obstruction in these regions. There is no adenopathy appreciable in the abdomen or pelvis. Reproductive: Prostate and seminal vesicles are normal in size and contour. There are prostatic calculi inferiorly. No pelvic mass evident. Other: Appendix appears unremarkable. No abscess or ascites is evident in the abdomen or pelvis. There is a small ventral hernia containing only fat. There is a small ventral hernia containing only fat more superiorly, approximately 15 cm superior to the umbilicus. There is fat in each inguinal ring region. Musculoskeletal: There is degenerative change in the lumbar spine. There is spinal stenosis at L4-5 due to bony hypertrophy and diffuse disc protrusion. There is also ligamentum flava hypertrophy bilaterally at this level. There are no blastic or lytic bone lesions. No intramuscular or abdominal wall lesion evident. IMPRESSION: 1. Extensive left-sided colonic diverticula without diverticulitis. No bowel obstruction. No abscess. Appendix appears normal. 2. Pericardial effusion. Loculated fluid left lung base. Bibasilar lung scarring. 3.  No renal or ureteral calculus.  No hydronephrosis. 4.  Cholelithiasis. 5.  Hepatic steatosis.  No focal liver lesion evident. 6. No renal or ureteral calculus. No hydronephrosis. There are prostatic calculi bilaterally. 7.  Small ventral hernias containing only fat. 8. Extensive aortoiliac atherosclerosis. Calcification is noted in the major  mesenteric vessels. There appears to be hemodynamically significant obstructive disease due to calcification in both proximal renal arteries. In this regard, question whether patient is hypertensive. 9.  Spinal stenosis at L4-5, multifactorial in etiology. Aortic Atherosclerosis (ICD10-I70.0). Electronically Signed   By: Lowella Grip III M.D.   On: 11/04/2016 13:33    Procedures Procedures (including critical care time)  Medications Ordered in ED Medications  ipratropium-albuterol (DUONEB) 0.5-2.5 (3) MG/3ML nebulizer solution 3 mL (3 mLs Nebulization Given 11/04/16 1126)  iopamidol (ISOVUE-300) 61 % injection (100 mLs  Contrast Given 11/04/16 1307)     Initial Impression / Assessment and Plan / ED Course  I have reviewed  the triage vital signs and the nursing notes.  Pertinent labs & imaging results that were available during my care of the patient were reviewed by me and considered in my medical decision making (see chart for details).  82 year old male presents with worsening SOB, abdominal distension, and low back pain. He is hypertensive and mildly tachypneic but otherwise vitals are normal. Exam remarkable for wheezes and diffuse abdominal tenderness. Labs are unremarkable. UA clean. Trop is 0.03 which is at baseline. Duoneb and imaging ordered and will recheck.  CXR shows chronic disease. CT of abdomen does not show any acute pathology. Discussed results with pt. He reports mild symptomatic improvement from breathing tx. Ambulatory sats are mid-70s and he is very symptomatic. Discussed with Dr. Wilson Singer. Will have pt admitted for COPD exacerbation. Steroids ordered. Spoke with Dyanne Carrel, NP who will admit.  Final Clinical Impressions(s) / ED Diagnoses   Final diagnoses:  Acute midline low back pain without sciatica  COPD exacerbation ([place])  Generalized abdominal pain    New Prescriptions New Prescriptions   No medications on file     Iris Pert 11/04/16  1635    Virgel Manifold, MD 11/09/16 [phone]

## 2016-11-04 NOTE — ED Triage Notes (Signed)
Pt has history of lung cancer and has normal sob with activity, but it has got worse. Pt complains of abdominal swelling and mid back pain.

## 2016-11-04 NOTE — ED Notes (Addendum)
Medications not verified by pharmacy. Contacted pharmacy at this time to verify.

## 2016-11-05 ENCOUNTER — Observation Stay (HOSPITAL_COMMUNITY): Payer: Medicare Other

## 2016-11-05 DIAGNOSIS — I10 Essential (primary) hypertension: Secondary | ICD-10-CM

## 2016-11-05 DIAGNOSIS — R0602 Shortness of breath: Secondary | ICD-10-CM | POA: Diagnosis not present

## 2016-11-05 DIAGNOSIS — I313 Pericardial effusion (noninflammatory): Secondary | ICD-10-CM | POA: Diagnosis not present

## 2016-11-05 DIAGNOSIS — J449 Chronic obstructive pulmonary disease, unspecified: Secondary | ICD-10-CM

## 2016-11-05 DIAGNOSIS — R079 Chest pain, unspecified: Secondary | ICD-10-CM | POA: Diagnosis not present

## 2016-11-05 DIAGNOSIS — J80 Acute respiratory distress syndrome: Secondary | ICD-10-CM | POA: Diagnosis not present

## 2016-11-05 DIAGNOSIS — K5900 Constipation, unspecified: Secondary | ICD-10-CM

## 2016-11-05 DIAGNOSIS — K219 Gastro-esophageal reflux disease without esophagitis: Secondary | ICD-10-CM | POA: Diagnosis not present

## 2016-11-05 DIAGNOSIS — J438 Other emphysema: Secondary | ICD-10-CM | POA: Diagnosis not present

## 2016-11-05 DIAGNOSIS — J9601 Acute respiratory failure with hypoxia: Secondary | ICD-10-CM | POA: Diagnosis not present

## 2016-11-05 DIAGNOSIS — C3431 Malignant neoplasm of lower lobe, right bronchus or lung: Secondary | ICD-10-CM | POA: Diagnosis not present

## 2016-11-05 DIAGNOSIS — R42 Dizziness and giddiness: Secondary | ICD-10-CM | POA: Diagnosis not present

## 2016-11-05 DIAGNOSIS — I119 Hypertensive heart disease without heart failure: Secondary | ICD-10-CM | POA: Diagnosis not present

## 2016-11-05 DIAGNOSIS — J8410 Pulmonary fibrosis, unspecified: Secondary | ICD-10-CM | POA: Diagnosis not present

## 2016-11-05 DIAGNOSIS — I2510 Atherosclerotic heart disease of native coronary artery without angina pectoris: Secondary | ICD-10-CM | POA: Diagnosis not present

## 2016-11-05 DIAGNOSIS — E8801 Alpha-1-antitrypsin deficiency: Secondary | ICD-10-CM | POA: Diagnosis not present

## 2016-11-05 DIAGNOSIS — R1084 Generalized abdominal pain: Secondary | ICD-10-CM | POA: Diagnosis not present

## 2016-11-05 DIAGNOSIS — J441 Chronic obstructive pulmonary disease with (acute) exacerbation: Secondary | ICD-10-CM | POA: Diagnosis not present

## 2016-11-05 LAB — CBC
HCT: 42 % (ref 39.0–52.0)
Hemoglobin: 13.4 g/dL (ref 13.0–17.0)
MCH: 28.6 pg (ref 26.0–34.0)
MCHC: 31.9 g/dL (ref 30.0–36.0)
MCV: 89.6 fL (ref 78.0–100.0)
PLATELETS: 230 10*3/uL (ref 150–400)
RBC: 4.69 MIL/uL (ref 4.22–5.81)
RDW: 13.8 % (ref 11.5–15.5)
WBC: 8.2 10*3/uL (ref 4.0–10.5)

## 2016-11-05 LAB — BASIC METABOLIC PANEL
ANION GAP: 9 (ref 5–15)
BUN: 14 mg/dL (ref 6–20)
CALCIUM: 9.1 mg/dL (ref 8.9–10.3)
CO2: 25 mmol/L (ref 22–32)
CREATININE: 0.91 mg/dL (ref 0.61–1.24)
Chloride: 103 mmol/L (ref 101–111)
GFR calc Af Amer: >60 mL/min (ref >60)
GLUCOSE: 201 mg/dL — AB (ref 65–99)
Potassium: 4.1 mmol/L (ref 3.5–5.1)
Sodium: 137 mmol/L (ref 135–145)

## 2016-11-05 MED ORDER — ALBUTEROL SULFATE (2.5 MG/3ML) 0.083% IN NEBU: 2.5000 mg | INHALATION_SOLUTION | RESPIRATORY_TRACT | 12 refills | Status: DC | PRN

## 2016-11-05 MED ORDER — ALBUTEROL SULFATE HFA 108 (90 BASE) MCG/ACT IN AERS: 2.0000 | INHALATION_SPRAY | Freq: Four times a day (QID) | RESPIRATORY_TRACT | 2 refills | Status: DC | PRN

## 2016-11-05 MED ORDER — AZITHROMYCIN 500 MG PO TABS
250.0000 mg | ORAL_TABLET | Freq: Every day | ORAL | Status: DC
Start: 2016-11-06 — End: 2016-11-05

## 2016-11-05 MED ORDER — AZITHROMYCIN 500 MG PO TABS
500.0000 mg | ORAL_TABLET | Freq: Every day | ORAL | Status: AC
  Administered 2016-11-05: 500 mg via ORAL
  Filled 2016-11-05: qty 1

## 2016-11-05 MED ORDER — AZITHROMYCIN 250 MG PO TABS: 500.0000 mg | ORAL_TABLET | Freq: Every day | ORAL | 0 refills | Status: DC

## 2016-11-05 MED ORDER — PREDNISONE 10 MG (21) PO TBPK: ORAL_TABLET | ORAL | 0 refills | Status: DC

## 2016-11-05 MED ORDER — IPRATROPIUM-ALBUTEROL 0.5-2.5 (3) MG/3ML IN SOLN: 3.0000 mL | Freq: Three times a day (TID) | RESPIRATORY_TRACT | 0 refills | Status: DC

## 2016-11-05 MED ORDER — GUAIFENESIN ER 600 MG PO TB12: 600.0000 mg | ORAL_TABLET | Freq: Two times a day (BID) | ORAL | 0 refills | Status: DC

## 2016-11-05 MED ORDER — FUROSEMIDE 20 MG PO TABS: 20.0000 mg | ORAL_TABLET | Freq: Every day | ORAL | 0 refills | Status: DC

## 2016-11-05 NOTE — Care Management Note (Signed)
Case Management Note  Patient Details  Name: DORION PETILLO MRN: [id number] Date of Birth: 1935-04-02  Subjective/Objective:      Admitted with ARF.              Action/Plan:  Plan is to d/c to home today with home health services.  Expected Discharge Date:  11/05/16               Expected Discharge Plan:  Acute to Acute Transfer  In-House Referral:     Discharge planning Services  CM Consult  Post Acute Care Choice:    Choice offered to:  Patient  DME Arranged:  Nebulizer machine DME Agency:  [facility] Arranged:  PT, OT, RN [facility] Agency:  [place]  Status of Service:  Completed, signed off  If discussed at [place] of Stay Meetings, dates discussed:    Additional Comments:  Sharin Mons, RN 11/05/2016, 3:02 PM

## 2016-11-05 NOTE — Progress Notes (Addendum)
Nutrition Brief Note  RD consulted to assess nutrition requirements/ status, per COPD GOLD protocol.  Wt Readings from Last 15 Encounters:  11/04/16 198 lb 10.2 oz (90.1 kg)  07/31/16 198 lb (89.8 kg)  06/24/16 195 lb 6.4 oz (88.6 kg)  06/23/16 195 lb 9.6 oz (88.7 kg)  04/01/16 199 lb (90.3 kg)  03/20/16 195 lb (88.5 kg)  02/25/16 198 lb (89.8 kg)  02/19/16 196 lb 1.9 oz (89 kg)  01/29/16 198 lb 12.8 oz (90.2 kg)  12/18/15 195 lb 3.2 oz (88.5 kg)  12/03/15 194 lb (88 kg)  10/30/15 195 lb 6.4 oz (88.6 kg)  07/25/15 186 lb 12.8 oz (84.7 kg)  06/04/15 195 lb (88.5 kg)  04/18/15 191 lb (86.6 kg)   [name] is a 82 y.o. male who presents with acute restorative failure secondary to COPD exacerbation in the setting of chronic advanced pulmonary fibrosis.  Pt admitted with COPD exacerbation.   Spoke with pt, who reports very good appetite. He typically consumes 2-3 meals per day (Breakfast: sausage, egg, and cheese biscuit; Lunch: bologna or pimento cheese sandwich, and Dinner: meat, starch, and vegetable).   Pt denies any weight loss, reports gaining weight due to reduced physical activity. Discussed ways pt could make healthier food choices and to assist with weight loss, as physical activity is limited related to respiratory status.   Nutrition-Focused physical exam completed. Findings are no fat depletion, no muscle depletion, and no edema.   Body mass index is 28.5 kg/m. Patient meets criteria for overweight based on current BMI.   Current diet order is Heart Healthy, patient is consuming approximately 25-100% of meals at this time. Labs and medications reviewed.   No nutrition interventions warranted at this time. If nutrition issues arise, please consult RD.   Baani Bober A. Jimmye Norman, RD, LDN, CDE Pager: [phone] After hours Pager: [phone]

## 2016-11-05 NOTE — Evaluation (Signed)
Occupational Therapy Evaluation Patient Details Name: [name] Fields MRN: [id number] DOB: May 18, 1934 Today's Date: 11/05/2016    History of Present Illness Pt admitted with acute respiratory failure with hypoxia. PMH: COPD, advanced pulmonary fibrosis, lung ca, CAD, HTN, vertigo, B knee arthritis.   Clinical Impression   Pt lives alone (with his dogs) with support of his sister for housekeeping and some meal prep. Pt goes out to eat frequently. He walks with a cane and reports multiple falls due to vertigo. Pt is able to care for himself independently and stands to shower. Pt presents with decreased activity tolerance, impaired standing balance and decreased safety awareness/impulsivity with mobility. Will follow acutely.    Follow Up Recommendations  Home health OT    Equipment Recommendations   (TBD)    Recommendations for Other Services       Precautions / Restrictions Precautions Precautions: Fall Precaution Comments: many falls in the last 6 months      Mobility Bed Mobility Overal bed mobility: Modified Independent             General bed mobility comments: HOB up   Transfers Overall transfer level: Needs assistance Equipment used: Straight cane Transfers: Sit to/from Stand Sit to Stand: Supervision         General transfer comment: cues to stand momentarily prior to walking    Balance Overall balance assessment: Needs assistance   Sitting balance-Leahy Scale: Good     Standing balance support: Single extremity supported Standing balance-Leahy Scale: Fair                             ADL either performed or assessed with clinical judgement   ADL Overall ADL's : Needs assistance/impaired Eating/Feeding: Independent;Sitting   Grooming: Wash/dry hands;Standing;Supervision/safety   Upper Body Bathing: Set up;Sitting   Lower Body Bathing: Sit to/from stand;Supervison/ safety   Upper Body Dressing : Set up;Sitting   Lower Body  Dressing: Supervision/safety;Sit to/from stand   Toilet Transfer: Supervision/safety;Ambulation (cane)   Toileting- Clothing Manipulation and Hygiene: Supervision/safety;Sit to/from stand       Functional mobility during ADLs: Supervision/safety;Cane;Cueing for safety General ADL Comments: Pt with tendency to stand quickly, reports many falls and hx of vertigo.     Vision Baseline Vision/History: Wears glasses Wears Glasses: Reading only Patient Visual Report: No change from baseline       Perception     Praxis      Pertinent Vitals/Pain Pain Assessment: 0-10 Pain Score: 6  Pain Location: R flank Pain Descriptors / Indicators: Sore Pain Intervention(s): Monitored during session;Repositioned     Hand Dominance Right   Extremity/Trunk Assessment Upper Extremity Assessment Upper Extremity Assessment: Overall WFL for tasks assessed   Lower Extremity Assessment Lower Extremity Assessment: Defer to PT evaluation       Communication Communication Communication: HOH (wears B aids)   Cognition Arousal/Alertness: Awake/alert Behavior During Therapy: Impulsive Overall Cognitive Status: Impaired/Different from baseline Area of Impairment: Memory;Safety/judgement                     Memory: Decreased short-term memory   Safety/Judgement: Decreased awareness of safety     General Comments: pt's sister reports memory has declined   General Comments       Exercises     Shoulder Instructions      Home Living Family/patient expects to be discharged to:: Private residence Living Arrangements: Alone Available Help at Discharge: Family;Available PRN/intermittently Type of  Home: House Home Access: Level entry     Home Layout: One level     Bathroom Shower/Tub: Occupational psychologist: Handicapped height     Home Equipment: Cane - single point          Prior Functioning/Environment Level of Independence: Independent with assistive  device(s);Needs assistance  Gait / Transfers Assistance Needed: walks with cane, reports falls related to vertigo ADL's / Homemaking Assistance Needed: assisted for heavy housekeeping and some meal prep, independent in self care   Comments: drives        OT Problem List: Impaired balance (sitting and/or standing);Decreased safety awareness;Pain;Cardiopulmonary status limiting activity      OT Treatment/Interventions: Self-care/ADL training;Energy conservation;DME and/or AE instruction;Patient/family education;Balance training    OT Goals(Current goals can be found in the care plan section) Acute Rehab OT Goals Patient Stated Goal: to go home ASAP OT Goal Formulation: With patient Time For Goal Achievement: 11/12/16 Potential to Achieve Goals: Good ADL Goals Pt Will Perform Grooming: with modified independence;standing (3 activities) Pt Will Transfer to Toilet: with modified independence;ambulating;regular height toilet Pt Will Perform Toileting - Clothing Manipulation and hygiene: with modified independence;sit to/from stand Pt Will Perform Tub/Shower Transfer: Shower transfer;with modified independence;ambulating (determine need for shower seat/3 in 1) Additional ADL Goal #1: Pt will implement energy conservation strategies and breathing techniques in ADL  with min verbal cues.  OT Frequency: Min 2X/week   Barriers to D/C:            Co-evaluation              AM-PAC PT "6 Clicks" Daily Activity     Outcome Measure Help from another person eating meals?: None Help from another person taking care of personal grooming?: A Little Help from another person toileting, which includes using toliet, bedpan, or urinal?: A Little Help from another person bathing (including washing, rinsing, drying)?: A Little Help from another person to put on and taking off regular upper body clothing?: None Help from another person to put on and taking off regular lower body clothing?: A  Little 6 Click Score: 20   End of Session Equipment Utilized During Treatment: Gait belt Nurse Communication: Mobility status (ok to not have chair alarm)  Activity Tolerance: Patient limited by fatigue Patient left: in chair;with call bell/phone within reach;with nursing/sitter in room;with family/visitor present  OT Visit Diagnosis: Unsteadiness on feet (R26.81);History of falling (Z91.81);Repeated falls (R29.6);Other abnormalities of gait and mobility (R26.89);Pain;Other symptoms and signs involving cognitive function                Time: 9211-9417 OT Time Calculation (min): 24 min Charges:  OT General Charges $OT Visit: 1 Procedure OT Evaluation $OT Eval Moderate Complexity: 1 Procedure OT Treatments $Self Care/Home Management : 8-22 mins G-Codes: OT G-codes **NOT FOR INPATIENT CLASS** Functional Assessment Tool Used: Clinical judgement Functional Limitation: Self care Self Care Current Status (E0814): At least 1 percent but less than 20 percent impaired, limited or restricted Self Care Goal Status (G8185): At least 1 percent but less than 20 percent impaired, limited or restricted    Malka So 11/05/2016, 11:38 AM  [phone]

## 2016-11-05 NOTE — Progress Notes (Signed)
SATURATION QUALIFICATIONS: (This note is used to comply with regulatory documentation for home oxygen)  Patient Saturations on Room Air at Rest = 93%  Patient Saturations on Room Air while Ambulating = 90%

## 2016-11-05 NOTE — Discharge Summary (Signed)
Physician Discharge Summary  [name] Fields:[id number] DOB: 05/15/1934 DOA: 11/04/2016  PCP: [name] Huddle, MD  Admit date: 11/04/2016 Discharge date: 11/05/2016  Admitted From: Home Disposition:  Home with [name] Fields PT/OT/RN  Recommendations for Outpatient Follow-up:  1. Follow up with PCP in 1-2 weeks; Appointment made for you on 11/17/16 at 12:45 pm 2. Follow up with Pulmonology on 11/18/16 at 11:45 am 3. Please obtain CMP/CBC, Mag, Phos in one week  4. Repeat CXR in 3-6 weeks 5. Please follow up on the following pending results:  Home Health: YES Equipment/Devices: None recommended by PT  Discharge Condition: Stable CODE STATUS: FULL CODE Diet recommendation: Heart Healthy Diet  Brief/Interim Summary: [name] Fields is a very pleasant very HOH 82 y.o. male with medical history significant for COPD not on home oxygen, pulmonary fibrosis, CAD, hypertension, hyperlipidemia, lung cancer who presents to the Emergency Department with a chief complaint worsening shortness of breath as well as worsening cough and increased sputum production. Initial evaluation revealed acute respiratory failure with hypoxia with ambulation likely related to COPD exacerbation. Patient stated he's had intermittent worsening shortness of breath over the last 2 weeks. Associated symptoms include wheezing with worsening cough. He was admitted for his Acute Hypoxic Respiratory Failure from COPD Exacerbation and improved with Steroids, Nebs, and Abx. He underwent a Home O2 screen and did not require any O2. He was improved and deemed stable to D/C Home and will go home with a Nebulizer, Home Health PT/OT/RN. He will follow up with PCP and Pulmonary at D/C and was told that if he got worse to come back to the ED for evaluation.   Discharge Diagnoses:  Principal Problem:   Acute respiratory failure ([place]) Active Problems:   Cancer of lower lobe of right lung (HCC)   COPD (chronic obstructive pulmonary  disease) with emphysema (HCC)   Pericardial effusion   HTN (hypertension)   GERD (gastroesophageal reflux disease)   Atherosclerotic heart disease of native coronary artery without angina pectoris   CN (constipation)   COPD with exacerbation (HCC)   Vertigo   Chest pain  Acute respiratory failure with hypoxia likely related to COPD exacerbation.  -Trigger unclear. Patient not on home oxygen and does not use inhalers or nebulizers at home either. Oxygen saturation level dropped to 77% with ambulation while in the emergency department. -Chest x-ray revealsno acute cardiopulmonary disease. Stable chronic loculated left pleural effusion and chronic passive atelectasis in the left lower lobe and left upper lobe. 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  -He was provided with nebulizers and Solu-Medrol in the emergency department. -Admitted to telemetry -Continued scheduled Nebs and given Home Nebulizer and Rescue Inhalers -Solu-Medrol 60 mg every 12h and changed to Steroid Taper -Flutter valve and Incentive Spirometer -Ambulated documenting respiratory effort and oxygen saturation level and patient did not desaturate -Given Home Nebulizer and Azithromycin x 5 days -Outpatient follow-up with his pulmonologist on 8/14 -Follow up with PCP at D/C -[place] PT/OT/RN   COPD with emphysema/moderate restrictive lung disease. -Chart review indicates patient saw pulmonology in March of this year.  -Office note dictates plan for repeat spirometry with bronchodilator challenge at next appointment which is in September.  -Office note also indicates no need for further testing regarding his restrictive lung disease. -Given Abx course with Azithromycin x 5 days, Home Nebulizer, Steroid Taper, Flutter Valve and Incentive Spirometer -See Above  GERD.  -Stable at baseline -Continue home meds of Famotidine 10 mg po Daily  Hypertension.  -Only fair control in the emergency  department. Home medications include Lasix, Lopressor -Resume Home Lasix and Lopressor, C/w Tamsulosin at D/C -Follow up with PCP at D/C  Chest Pain, improved -Heart score 4. History of CAD.  -Patient describes epigastric pain last night.  -Initial troponins negative. EKG as noted above. Lipase within limits of normal. Pain free on admission -Cycled troponin and intial was 0.03 and repeat was <0.03 -Serial EKG -Continue ASA 81 mg po Daily, Atorvastatin 20 mg po and Metoprolol 12.5 mg po BID -Follow up with PCP at D/C  Cancer the right lower lobe.  -Completed treatment in over a year ago. Status post chemotherapy and radiation. He does have moderate restrictive disease secondary to radiation. -Follow-up with oncology as indicated -Has an appointment with Pulmonary on 8/14  Constipation, improving -Patient complains of chronic constipation/diarrhea concerning for irritable bowel disease. CT shows some stool -Milk of magnesia given x2 -Follow up with PCP   Discharge Instructions  Discharge Instructions    Call MD for:  difficulty breathing, headache or visual disturbances    Complete by:  As directed    Call MD for:  extreme fatigue    Complete by:  As directed    Call MD for:  persistant dizziness or light-headedness    Complete by:  As directed    Call MD for:  persistant nausea and vomiting    Complete by:  As directed    Call MD for:  redness, tenderness, or signs of infection (pain, swelling, redness, odor or green/yellow discharge around incision site)    Complete by:  As directed    Call MD for:  severe uncontrolled pain    Complete by:  As directed    Call MD for:  temperature >100.4    Complete by:  As directed    DME Nebulizer/meds    Complete by:  As directed    Patient needs a nebulizer to treat with the following condition:  COPD (chronic obstructive pulmonary disease) ([place])   Diet - low sodium heart healthy    Complete by:  As directed    Discharge  instructions    Complete by:  As directed    Follow up with PCP and Pulmonology at D/C. PCP appointment scheduled for 8/13 at 12:45 pm and Pulmonology Appointment scheduled for 8/14 at 11:45 am. Take all medications as prescribed. If symptoms change or worsen please return to the ED for evaluation.   Increase activity slowly    Complete by:  As directed      Allergies as of 11/05/2016      Reactions   Sertraline Hcl    Patient does not remember the reaction      Medication List    TAKE these medications   albuterol (2.5 MG/3ML) 0.083% nebulizer solution Commonly known as:  PROVENTIL Take 3 mLs (2.5 mg total) by nebulization every 4 (four) hours as needed for wheezing.   albuterol 108 (90 Base) MCG/ACT inhaler Commonly known as:  PROVENTIL HFA;VENTOLIN HFA Inhale 2 puffs into the lungs every 6 (six) hours as needed for wheezing or shortness of breath.   aspirin EC 81 MG tablet Take 81 mg by mouth daily.   atorvastatin 20 MG tablet Commonly known as:  LIPITOR TAKE 1 TABLET BY MOUTH DAILY What changed:  See the new instructions.   azithromycin 250 MG tablet Commonly known as:  ZITHROMAX Take 2 tablets (500 mg total) by mouth daily.   buPROPion 150 MG 12  hr tablet Commonly known as:  WELLBUTRIN SR Take 150 mg by mouth daily.   dutasteride 0.5 MG capsule Commonly known as:  AVODART Take 0.5 mg by mouth daily.   furosemide 20 MG tablet Commonly known as:  LASIX Take 1 tablet (20 mg total) by mouth daily.   guaiFENesin 600 MG 12 hr tablet Commonly known as:  MUCINEX Take 1 tablet (600 mg total) by mouth 2 (two) times daily.   ipratropium-albuterol 0.5-2.5 (3) MG/3ML Soln Commonly known as:  DUONEB Take 3 mLs by nebulization 3 (three) times daily.   metoprolol tartrate 25 MG tablet Commonly known as:  LOPRESSOR TAKE 1/2 TABLET BY MOUTH TWICE DAILY What changed:  See the new instructions.   predniSONE 10 MG (21) Tbpk tablet Commonly known as:  STERAPRED UNI-PAK 21  TAB Take 6 Tablets Day 1, 5 Tablets Day 2, 4 Tablets Day 3, 3 Tablets Day 4, 2 Tablets Day 5, 1 Tablet Day 6 and Stop Day 7   ranitidine 150 MG capsule Commonly known as:  ZANTAC Take 150 mg by mouth 3 (three) times a week.   tamsulosin 0.4 MG Caps capsule Commonly known as:  FLOMAX Take 0.4 mg by mouth daily with supper.   temazepam 30 MG capsule Commonly known as:  RESTORIL Take 30 mg by mouth at bedtime   venlafaxine XR 150 MG 24 hr capsule Commonly known as:  EFFEXOR-XR Take 150 mg by mouth daily.   Vitamin D3 2000 units Tabs Take 2,000 Units by mouth 2 (two) times daily.            Durable Medical Equipment        Start     Ordered   11/05/16 1442  For home use only DME Nebulizer machine  Once    Question:  Patient needs a nebulizer to treat with the following condition  Answer:  Acute respiratory failure ([place])   11/05/16 1442   11/05/16 0000  DME Nebulizer/meds    Question:  Patient needs a nebulizer to treat with the following condition  Answer:  COPD (chronic obstructive pulmonary disease) ([place])   11/05/16 1450     Follow-up Information    [facility] on 11/18/2016.   Why:   Pulmonologist appointment scheduled for 11/18/2016 at 11:45 am Contact information:  [address], [place], [place], [place] [address]   Phone: [phone]       [facility] Follow up.   Why:  Nebulizer will be delivererd to bedside prior to discharge Contact information: [place] [address] [phone]        [facility] Follow up.   Why:  homehealth services arranged, office will call to set up home visits Contact information: [place] [address] [phone]        [name] Huddle, MD Follow up on 11/17/2016.   Specialty:  Internal Medicine Why:  Appointment for 12:45 pm on 11/17/16 Contact information: [address] [place]   [phone]          Allergies  Allergen Reactions  . Sertraline Hcl     Patient does not remember the reaction   Consultations:  None  Procedures/Studies: Dg Chest 2 View  Result Date: 11/04/2016 CLINICAL DATA:  Chronic chest pain and shortness of breath, presenting with acute worsening of shortness of breath. Personal history of right lower lobe lung cancer. One hundred twenty  pack-year smoking history. Chronic left pleural effusion. EXAM: CHEST  2 VIEW COMPARISON:  CT chest 10/29/2015 and earlier. Chest x-rays 10/29/2015 and earlier. FINDINGS: AP semi-erect and lateral images were obtained. Cardiac silhouette mildly enlarged, unchanged. Thoracic aorta tortuous and atherosclerotic, unchanged. Hilar and mediastinal contours otherwise unremarkable. Chronic loculated left pleural effusion at the apex and base, unchanged, with associated chronic passive atelectasis involving the left lower lobe and to a lesser degree the left upper lobe. 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 No new pulmonary parenchymal abnormalities in the right lung. Right jugular Port-A-Cath tip in the lower SVC, unchanged. Mild degenerative changes involving the thoracic and upper lumbar spine. IMPRESSION: 1.  No acute cardiopulmonary disease. 2. Stable chronic loculated left pleural effusion and chronic passive atelectasis in the left lower lobe and left upper lobe. 3. 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 4.  Aortic Atherosclerosis (ICD10-170.0) Electronically Signed   By: Evangeline Dakin M.D.   On: 11/04/2016 11:29   Ct Abdomen Pelvis W Contrast  Result Date: 11/04/2016 CLINICAL DATA:  Abdominal pain and nausea. History of lung carcinoma EXAM: CT ABDOMEN AND PELVIS WITH CONTRAST TECHNIQUE: Multidetector CT imaging of the abdomen and pelvis was performed using the standard protocol following bolus administration of intravenous contrast. CONTRAST:  12mL ISOVUE-300  IOPAMIDOL (ISOVUE-300) INJECTION 61% COMPARISON:  PET-CT August 05, 2011 FINDINGS: Lower chest: There is scarring in the lung bases, with loculated fluid and pleural thickening in the left base. There is a focal pericardial effusion. There are foci of coronary artery calcification. Hepatobiliary: There is hepatic steatosis. No focal liver lesions are evident. There is cholelithiasis. Gallbladder wall does not appear appreciably thickened. No biliary duct dilatation. Pancreas: No pancreatic mass or inflammatory focus. There is fatty infiltration within the pancreas. Spleen: No splenic lesions are evident. Adrenals/Urinary Tract: Adrenals appear unremarkable bilaterally. Kidneys bilaterally show no evident mass or hydronephrosis on either side. There is a 3 mm calculus in the upper pole of the right kidney, nonobstructing. No ureteral calculi are evident. Urinary bladder is midline with wall thickness within normal limits. Stomach/Bowel: There are multiple sigmoid and descending colonic diverticula without diverticulitis. There is no appreciable bowel wall or mesenteric thickening. There is no bowel obstruction. No free air or portal venous air. Vascular/Lymphatic: There is atherosclerotic calcification in the aorta and iliac arteries. No aneurysm evident. There is moderate calcification in the proximal mesenteric arteries without mesenteric artery obstruction. There is calcification at the origin of the left and right renal arteries with apparent hemodynamically significant obstruction in these regions. There is no adenopathy appreciable in the abdomen or pelvis. Reproductive: Prostate and seminal vesicles are normal in size and contour. There are prostatic calculi inferiorly. No pelvic mass evident. Other: Appendix appears unremarkable. No abscess or ascites is evident in the abdomen or pelvis. There is a small ventral hernia containing only fat. There is a small ventral hernia containing only fat more superiorly,  approximately 15 cm superior to the umbilicus. There is fat in each inguinal ring region. Musculoskeletal: There is degenerative change in the lumbar spine. There is spinal stenosis at L4-5 due to bony hypertrophy and diffuse disc protrusion. There is also ligamentum flava hypertrophy bilaterally at this level. There are no blastic or lytic bone lesions. No intramuscular or abdominal wall lesion evident. IMPRESSION: 1. Extensive left-sided colonic diverticula without diverticulitis. No bowel obstruction. No abscess. Appendix appears normal. 2. Pericardial effusion. Loculated fluid left lung base. Bibasilar lung scarring. 3.  No renal or ureteral calculus.  No hydronephrosis. 4.  Cholelithiasis. 5.  Hepatic steatosis.  No focal liver lesion evident. 6. No renal or ureteral calculus. No hydronephrosis. There are prostatic calculi bilaterally. 7.  Small ventral hernias containing only fat. 8. Extensive aortoiliac atherosclerosis. Calcification is noted in the major mesenteric vessels. There appears to be hemodynamically significant obstructive disease due to calcification in both proximal renal arteries. In this regard, question whether patient is hypertensive. 9.  Spinal stenosis at L4-5, multifactorial in etiology. Aortic Atherosclerosis (ICD10-I70.0). Electronically Signed   By: Lowella Grip III M.D.   On: 11/04/2016 13:33   Dg Chest Port 1 View  Result Date: 11/05/2016 CLINICAL DATA:  Increased shortness of breath . EXAM: PORTABLE CHEST 1 VIEW COMPARISON:  11/04/2016 .  CT 10/29/2015. FINDINGS: PowerPort catheter with lead tip projected over superior vena cava. Stable cardiomegaly. Stable atelectasis/ consolidation left upper lung. Stable atelectasis both lung bases, left side greater than right. Stable left-sided pleural effusion. IMPRESSION: 1.  PowerPort catheter in stable position. 2. Stable atelectasis and consolidation left upper lung. Stable atelectasis both lung bases, left side greater right.  Stable left-sided pleural effusion. Chest is unchanged from prior exams. Electronically Signed   By: Marcello Moores  Register   On: 11/05/2016 13:57    Subjective: Seen and examined at bedside and was doing better breathing wise. No nausea or vomiting. Felt good and wanted to go home.   Discharge Exam: Vitals:   11/05/16 0010 11/05/16 0426  BP: 133/67 111/71  Pulse: 86 84  Resp: 20 18  Temp: 97.6 F (36.4 C) 98.5 F (36.9 C)   Vitals:   11/05/16 0010 11/05/16 0426 11/05/16 0833 11/05/16 1447  BP: 133/67 111/71    Pulse: 86 84    Resp: 20 18    Temp: 97.6 F (36.4 C) 98.5 F (36.9 C)    TempSrc: Oral     SpO2: 96% 95% 96% 95%  Weight:      Height:       General: Pt is alert, awake, not in acute distress Cardiovascular: RRR, S1/S2 +, no rubs, no gallops Respiratory: Diminished bilaterally with slight end expiratory wheezing, no rhonchi; Slightly tachypenic but not using any accessory muscles to breathe  Abdominal: Soft, NT, ND, bowel sounds + Extremities: no edema, no cyanosis  The results of significant diagnostics from this hospitalization (including imaging, microbiology, ancillary and laboratory) are listed below for reference.    Microbiology: No results found for this or any previous visit (from the past 240 hour(s)).   Labs: BNP (last 3 results) No results for input(s): BNP in the last 8760 hours. Basic Metabolic Panel:  Recent Labs Lab 11/04/16 1011 11/05/16 0352  NA 140 137  K 4.4 4.1  CL 103 103  CO2 27 25  GLUCOSE 103* 201*  BUN 12 14  CREATININE 0.81 0.91  CALCIUM 9.3 9.1   Liver Function Tests:  Recent Labs Lab 11/04/16 1011  AST 31  ALT 24  ALKPHOS 114  BILITOT 1.1  PROT 7.4  ALBUMIN 4.0    Recent Labs Lab 11/04/16 1011  LIPASE 36   No results for input(s): AMMONIA in the last 168 hours. CBC:  Recent Labs Lab 11/04/16 1011 11/05/16 0352  WBC 8.6 8.2  HGB 14.3 13.4  HCT 43.8 42.0  MCV 90.5 89.6  PLT 236 230   Cardiac  Enzymes:  Recent Labs Lab 11/04/16 1414 11/04/16 2056  TROPONINI 0.03* <0.03   BNP: Invalid input(s): POCBNP CBG: No  results for input(s): GLUCAP in the last 168 hours. D-Dimer No results for input(s): DDIMER in the last 72 hours. Hgb A1c No results for input(s): HGBA1C in the last 72 hours. Lipid Profile No results for input(s): CHOL, HDL, LDLCALC, TRIG, CHOLHDL, LDLDIRECT in the last 72 hours. Thyroid function studies No results for input(s): TSH, T4TOTAL, T3FREE, THYROIDAB in the last 72 hours.  Invalid input(s): FREET3 Anemia work up No results for input(s): VITAMINB12, FOLATE, FERRITIN, TIBC, IRON, RETICCTPCT in the last 72 hours. Urinalysis    Component Value Date/Time   COLORURINE YELLOW 11/04/2016 1128   APPEARANCEUR CLEAR 11/04/2016 1128   LABSPEC 1.015 11/04/2016 1128   PHURINE 7.0 11/04/2016 1128   GLUCOSEU NEGATIVE 11/04/2016 1128   HGBUR NEGATIVE 11/04/2016 1128   BILIRUBINUR NEGATIVE 11/04/2016 1128   KETONESUR NEGATIVE 11/04/2016 1128   PROTEINUR NEGATIVE 11/04/2016 1128   UROBILINOGEN 0.2 12/19/2014 2330   NITRITE NEGATIVE 11/04/2016 1128   LEUKOCYTESUR NEGATIVE 11/04/2016 1128   Sepsis Labs Invalid input(s): PROCALCITONIN,  WBC,  LACTICIDVEN Microbiology No results found for this or any previous visit (from the past 240 hour(s)).  Time coordinating discharge: 35 minutes  SIGNED:  Kerney Elbe, DO [facility] 11/05/2016, 2:54 PM Pager [phone]  If 7PM-7AM, please contact night-coverage [email or web address] Password [email or web address]

## 2016-11-05 NOTE — Evaluation (Signed)
Physical Therapy Evaluation Patient Details Name: [name] Fields MRN: [id number] DOB: Jul 16, 1934 Today's Date: 11/05/2016   History of Present Illness  Pt admitted with acute respiratory failure with hypoxia. PMH: COPD, advanced pulmonary fibrosis, lung ca, CAD, HTN, vertigo, B knee arthritis.  Clinical Impression  Patient presents with mild imbalance and history of falls.  Feel should be safe for d/c home with intermittent family support and follow up HHPT.  Encouraged to have assist for outside ambulation and to limit to about 5 minutes or less initially.  Educated in energy conservation and need for assist for heavier household tasks.  No further skilled PT needs at this time.     Follow Up Recommendations Home health PT    Equipment Recommendations  None recommended by PT    Recommendations for Other Services       Precautions / Restrictions Precautions Precautions: Fall Precaution Comments: many falls in the last 6 months      Mobility  Bed Mobility Overal bed mobility: Needs Assistance             General bed mobility comments: HOB flat  Transfers   Equipment used: Straight cane Transfers: Sit to/from Stand Sit to Stand: Modified independent (Device/Increase time)         General transfer comment: no device until standing, then pt held sink while I handed him cane  Ambulation/Gait Ambulation/Gait assistance: Supervision Ambulation Distance (Feet): 220 Feet Assistive device: Straight cane Gait Pattern/deviations: Step-through pattern;Decreased stride length;Drifts right/left;Wide base of support     General Gait Details: slightly unsteady with stiff leg gait pt reports due to arthritis, one minor LOB with self recovery due to using R hand for SpO2 monitoring and pt carrying cane in L hand  Stairs            Wheelchair Mobility    Modified Rankin (Stroke Patients Only)       Balance Overall balance assessment: Needs assistance   Sitting  balance-Leahy Scale: Good     Standing balance support: No upper extremity supported Standing balance-Leahy Scale: Good Standing balance comment: steadying himself initially standing with hand on sink, but able to balance and move some without UE support, however balance deficits evident                             Pertinent Vitals/Pain Pain Assessment: Faces Faces Pain Scale: Hurts little more Pain Location: stomach Pain Descriptors / Indicators: Discomfort Pain Intervention(s): Monitored during session    Home Living Family/patient expects to be discharged to:: Private residence Living Arrangements: Alone Available Help at Discharge: Family;Available PRN/intermittently Type of Home: House Home Access: Level entry     Home Layout: One level Home Equipment: Cane - single point Additional Comments: has access to RW from sister    Prior Function Level of Independence: Independent with assistive device(s);Needs assistance   Gait / Transfers Assistance Needed: walks with cane, reports falls when he "just goes down," denies recent issues with vertigo  ADL's / Homemaking Assistance Needed: assisted for heavy housekeeping and some meal prep, independent in self care  Comments: drives     Hand Dominance   Dominant Hand: Right    Extremity/Trunk Assessment   Upper Extremity Assessment Upper Extremity Assessment: Defer to OT evaluation    Lower Extremity Assessment Lower Extremity Assessment: Overall WFL for tasks assessed (but reports stiffness due to arthritis in knees)       Communication  Communication: HOH (B hearing aides)  Cognition Arousal/Alertness: Awake/alert Behavior During Therapy: WFL for tasks assessed/performed Overall Cognitive Status: Impaired/Different from baseline Area of Impairment: Memory                     Memory: Decreased short-term memory   Safety/Judgement: Decreased awareness of safety     General Comments:  admits to memory decline      General Comments General comments (skin integrity, edema, etc.): spO2 maintained at 92-93% on RA throughout ambulation despite audible wheezing (MD aware); issued handout on energy conservation and reviewed with pt; also educated on fall prevention    Exercises     Assessment/Plan    PT Assessment All further PT needs can be met in the next venue of care  PT Problem List Decreased balance;Decreased activity tolerance;Decreased safety awareness;Decreased mobility       PT Treatment Interventions      PT Goals (Current goals can be found in the Care Plan section)  Acute Rehab PT Goals Patient Stated Goal: to go home ASAP PT Goal Formulation: All assessment and education complete, DC therapy    Frequency     Barriers to discharge        Co-evaluation               AM-PAC PT "6 Clicks" Daily Activity  Outcome Measure Difficulty turning over in bed (including adjusting bedclothes, sheets and blankets)?: None Difficulty moving from lying on back to sitting on the side of the bed? : None Difficulty sitting down on and standing up from a chair with arms (e.g., wheelchair, bedside commode, etc,.)?: None Help needed moving to and from a bed to chair (including a wheelchair)?: A Little Help needed walking in hospital room?: A Little Help needed climbing 3-5 steps with a railing? : A Little 6 Click Score: 21    End of Session Equipment Utilized During Treatment: Gait belt Activity Tolerance: Patient tolerated treatment well Patient left: in bed;with call bell/phone within reach   PT Visit Diagnosis: Other abnormalities of gait and mobility (R26.89);History of falling (Z91.81)    Time: 8115-7262 PT Time Calculation (min) (ACUTE ONLY): 29 min   Charges:   PT Evaluation $PT Eval Low Complexity: 1 Low PT Treatments $Self Care/Home Management: 8-22   PT G Codes:   PT G-Codes **NOT FOR INPATIENT CLASS** Functional Assessment Tool Used:  AM-PAC 6 Clicks Basic Mobility Functional Limitation: Mobility: Walking and moving around Mobility: Walking and Moving Around Current Status (M3559): At least 20 percent but less than 40 percent impaired, limited or restricted Mobility: Walking and Moving Around Goal Status [phone]): At least 20 percent but less than 40 percent impaired, limited or restricted Mobility: Walking and Moving Around Discharge Status [phone]): At least 20 percent but less than 40 percent impaired, limited or restricted    [place], [name] Fields 11/05/2016   [name] Fields 11/05/2016, 2:58 PM

## 2016-11-05 NOTE — Progress Notes (Signed)
Patient discharge teaching given, including activity, diet, follow-up appoints, and medications. Patient verbalized understanding of all discharge instructions. IV access was d/c'd. Vitals are stable. Skin is intact except as charted in most recent assessments. Pt to be escorted out by NT, to be driven home by family. 

## 2016-11-10 DIAGNOSIS — F419 Anxiety disorder, unspecified: Secondary | ICD-10-CM | POA: Diagnosis not present

## 2016-11-10 DIAGNOSIS — M170 Bilateral primary osteoarthritis of knee: Secondary | ICD-10-CM | POA: Diagnosis not present

## 2016-11-10 DIAGNOSIS — E785 Hyperlipidemia, unspecified: Secondary | ICD-10-CM | POA: Diagnosis not present

## 2016-11-10 DIAGNOSIS — I2510 Atherosclerotic heart disease of native coronary artery without angina pectoris: Secondary | ICD-10-CM | POA: Diagnosis not present

## 2016-11-10 DIAGNOSIS — K5793 Diverticulitis of intestine, part unspecified, without perforation or abscess with bleeding: Secondary | ICD-10-CM | POA: Diagnosis not present

## 2016-11-10 DIAGNOSIS — J438 Other emphysema: Secondary | ICD-10-CM | POA: Diagnosis not present

## 2016-11-10 DIAGNOSIS — I10 Essential (primary) hypertension: Secondary | ICD-10-CM | POA: Diagnosis not present

## 2016-11-10 DIAGNOSIS — Z85118 Personal history of other malignant neoplasm of bronchus and lung: Secondary | ICD-10-CM | POA: Diagnosis not present

## 2016-11-10 DIAGNOSIS — K219 Gastro-esophageal reflux disease without esophagitis: Secondary | ICD-10-CM | POA: Diagnosis not present

## 2016-11-10 DIAGNOSIS — Z87891 Personal history of nicotine dependence: Secondary | ICD-10-CM | POA: Diagnosis not present

## 2016-11-10 DIAGNOSIS — J439 Emphysema, unspecified: Secondary | ICD-10-CM | POA: Diagnosis not present

## 2016-11-11 DIAGNOSIS — K5793 Diverticulitis of intestine, part unspecified, without perforation or abscess with bleeding: Secondary | ICD-10-CM | POA: Diagnosis not present

## 2016-11-11 DIAGNOSIS — I10 Essential (primary) hypertension: Secondary | ICD-10-CM | POA: Diagnosis not present

## 2016-11-11 DIAGNOSIS — I2510 Atherosclerotic heart disease of native coronary artery without angina pectoris: Secondary | ICD-10-CM | POA: Diagnosis not present

## 2016-11-11 DIAGNOSIS — M170 Bilateral primary osteoarthritis of knee: Secondary | ICD-10-CM | POA: Diagnosis not present

## 2016-11-11 DIAGNOSIS — J439 Emphysema, unspecified: Secondary | ICD-10-CM | POA: Diagnosis not present

## 2016-11-11 DIAGNOSIS — F419 Anxiety disorder, unspecified: Secondary | ICD-10-CM | POA: Diagnosis not present

## 2016-11-18 ENCOUNTER — Ambulatory Visit (INDEPENDENT_AMBULATORY_CARE_PROVIDER_SITE_OTHER): Payer: Medicare Other | Admitting: Adult Health

## 2016-11-18 ENCOUNTER — Encounter: Payer: Self-pay | Admitting: Adult Health

## 2016-11-18 DIAGNOSIS — I259 Chronic ischemic heart disease, unspecified: Secondary | ICD-10-CM | POA: Diagnosis not present

## 2016-11-18 DIAGNOSIS — J438 Other emphysema: Secondary | ICD-10-CM

## 2016-11-18 NOTE — Patient Instructions (Signed)
Continue on Duoneb Three times a day  .  Activity as tolerated.  Follow up with Dr Ashok Cordia in 6-8 weeks with chest xray .  Please contact office for sooner follow up if symptoms do not improve or worsen or seek emergency care

## 2016-11-18 NOTE — Assessment & Plan Note (Signed)
Recent exacerbation now resolving   Plan  Patient Instructions  Continue on Duoneb Three times a day  .  Activity as tolerated.  Follow up with Dr Ashok Cordia in 6-8 weeks with chest xray .  Please contact office for sooner follow up if symptoms do not improve or worsen or seek emergency care

## 2016-11-18 NOTE — Progress Notes (Signed)
@Patient  ID: [name] Fields, male    DOB: 1934/06/14, 82 y.o.   MRN: [id number]  Chief Complaint  Patient presents with  . Follow-up    COPD     Referring provider: [name], MD  HPI: 82 year old male former smoker followed for severe COPD with emphysema and history of non-small cell lung cancer, moderate restrictive lung disease likely due to post radiation effects with fibrosis in the left lung.  TEST   PFT 03/20/16: FVC 1.81 L (46%) FEV1 1.16 L (41%) FEV1/FVC 0.64 FEF 25-75 0.65 L (33%) negative bronchodilator response 11/05/15: FVC 1.86 L (47%) FEV1 1.22 L (43%) FEV1/FVC 0.66 FEF 25-75 0.73 L (37%) negative bronchodilator response TLC 4.01 L (88%) RV 86% ERV 33% DLCO uncorrected 32%  6MWT 03/20/16:  Walked 218 meters / Baseline Sat 97% on RA / Nadir Sat 97% on RA (paused for 15 seconds)  IMAGING CTA CHEST 10/29/15 : Heart normal in size. Loculated pericardial effusion noted. Right internal jugular Port-A-Cath noted. No pathologic mediastinal adenopathy. Paramediastinal opacity/mass in left upper lobe that is chronic. Atelectasis of left upper lobe is also appreciated along with pleural thickening & volume loss in the left lung. Small subpleural nodules noted bilaterally that are all subcentimeter. No obvious progression. Diffuse centrilobular & paraseptal emphysema.  CARDIAC TTE (12/20/14): Moderate LVH with EF 60-65%. No regional wall motion abnormalities. Grade 1 diastolic dysfunction. LA & RA normal in size. RV normal in size and function. Pulmonary artery systolic pressure 37 mmHg. Mild aortic regurgitation. No aortic dilatation. Mild mitral regurgitation. Trivial pulmonic regurgitation. Mild tricuspid regurgitation. Trivial pericardial effusion.  LABS 12/18/15 Alpha-1 antitrypsin: MS (136)   11/18/2016 Post hospital follow up : COPD  Patient returns for a follow-up from recent hospitalization. Patient was admitted last month for COPD exacerbation. He was  treated with IV antibiotics, steroids, and neb bronchodilators. Since discharge. Patient is starting to feel better. He has decreased cough and congestion. He is starting to walk again. Now is up to 30 minutes. He is using DuoNeb 3 times a day. He denies any chest pain, orthopnea, PND, or increased leg swelling.  Allergies  Allergen Reactions  . Sertraline Hcl     Patient does not remember the reaction    Immunization History  Administered Date(s) Administered  . Influenza Split 12/07/2010  . Influenza Whole 01/05/2010  . Influenza, High Dose Seasonal PF 01/06/2016  . Influenza,inj,Quad PF,36+ Mos 02/08/2013, 12/28/2014  . Influenza-Unspecified 02/13/2012  . Pneumococcal Conjugate-13 12/18/2015  . Pneumococcal Polysaccharide-23 01/05/2010    Past Medical History:  Diagnosis Date  . Anxiety    takes wellbutrin daily  . Arthritis    knees  . COPD (chronic obstructive pulmonary disease) ([place])   . Diverticulosis   . Emphysema of lung ([place])   . GERD (gastroesophageal reflux disease)   . Hearing loss   . Hx of radiation therapy 09/29/11;10/01/11;10/03/11;10/06/11;10/08/11;   RLLlung,50Gy/97fx - SBRT  . Hyperlipidemia   . Hypertension   . Hypokinesia    MILD INFERIOR  . IHD (ischemic heart disease)    Remote MI in 2001 with stent to the LCX  . Lactose intolerance   . Non-small cell carcinoma of lung ([place])    Dx in October of 2010  . Normal nuclear stress test 2009   EF 63%. No ischemia. 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  . Pericardial effusion July 2011   s/p pericardial window; last echo in August 2012 with minimal effusion  . Pleural effusion, left   . SOB (  shortness of breath)    Chronic with known fibrosis  . Status post chemotherapy 06/04/2009 -  09/06/2009    4 cycles of Cisplatin and Gemcitabine with Neulasta Support   . Vertigo    hx. of- ambulates with cane for safety.    Tobacco History: History  Smoking Status  . Former Smoker  . Packs/day: 3.00  . Years: 40.00  . Types:  Cigarettes  . Quit date: 04/07/1994  Smokeless Tobacco  . Never Used   Counseling given: Not Answered   Outpatient Encounter Prescriptions as of 11/18/2016  Medication Sig  . albuterol (PROVENTIL HFA;VENTOLIN HFA) 108 (90 Base) MCG/ACT inhaler Inhale 2 puffs into the lungs every 6 (six) hours as needed for wheezing or shortness of breath.  Marland Kitchen albuterol (PROVENTIL) (2.5 MG/3ML) 0.083% nebulizer solution Take 3 mLs (2.5 mg total) by nebulization every 4 (four) hours as needed for wheezing.  Marland Kitchen aspirin EC 81 MG tablet Take 81 mg by mouth daily.  Marland Kitchen atorvastatin (LIPITOR) 20 MG tablet TAKE 1 TABLET BY MOUTH DAILY (Patient taking differently: Take 20 mg by mouth once a day)  . buPROPion (WELLBUTRIN SR) 150 MG 12 hr tablet Take 150 mg by mouth daily.   . Cholecalciferol (VITAMIN D3) 2000 units TABS Take 2,000 Units by mouth 2 (two) times daily.   Marland Kitchen dutasteride (AVODART) 0.5 MG capsule Take 0.5 mg by mouth daily.   . furosemide (LASIX) 20 MG tablet Take 1 tablet (20 mg total) by mouth daily.  Marland Kitchen guaiFENesin (MUCINEX) 600 MG 12 hr tablet Take 1 tablet (600 mg total) by mouth 2 (two) times daily.  Marland Kitchen ipratropium-albuterol (DUONEB) 0.5-2.5 (3) MG/3ML SOLN Take 3 mLs by nebulization 3 (three) times daily.  . metoprolol tartrate (LOPRESSOR) 25 MG tablet TAKE 1/2 TABLET BY MOUTH TWICE DAILY (Patient taking differently: Take 12.5 mg by mouth two times a day)  . ranitidine (ZANTAC) 150 MG capsule Take 150 mg by mouth 3 (three) times a week.   . Tamsulosin HCl (FLOMAX) 0.4 MG CAPS Take 0.4 mg by mouth daily with supper.   . temazepam (RESTORIL) 30 MG capsule Take 30 mg by mouth at bedtime  . venlafaxine XR (EFFEXOR-XR) 150 MG 24 hr capsule Take 150 mg by mouth daily.   . [DISCONTINUED] azithromycin (ZITHROMAX) 250 MG tablet Take 2 tablets (500 mg total) by mouth daily. (Patient not taking: Reported on 11/18/2016)  . [DISCONTINUED] predniSONE (STERAPRED UNI-PAK 21 TAB) 10 MG (21) TBPK tablet Take 6 Tablets Day 1, 5  Tablets Day 2, 4 Tablets Day 3, 3 Tablets Day 4, 2 Tablets Day 5, 1 Tablet Day 6 and Stop Day 7 (Patient not taking: Reported on 11/18/2016)   No facility-administered encounter medications on file as of 11/18/2016.      Review of Systems  Constitutional:   No  weight loss, night sweats,  Fevers, chills,  +fatigue, or  lassitude.  HEENT:   No headaches,  Difficulty swallowing,  Tooth/dental problems, or  Sore throat,                No sneezing, itching, ear ache, nasal congestion, post nasal drip,   CV:  No chest pain,  Orthopnea, PND, swelling in lower extremities, anasarca, dizziness, palpitations, syncope.   GI  No heartburn, indigestion, abdominal pain, nausea, vomiting, diarrhea, change in bowel habits, loss of appetite, bloody stools.   Resp:    No chest wall deformity  Skin: no rash or lesions.  GU: no dysuria, change in color of  urine, no urgency or frequency.  No flank pain, no hematuria   MS:  No joint pain or swelling.  No decreased range of motion.  No back pain.    Physical Exam  BP 106/64 (BP Location: Left Arm, Cuff Size: Normal)   Pulse 74   Ht 5\' 10"  (1.778 m)   Wt 195 lb 11.2 oz (88.8 kg)   SpO2 95%   BMI 28.08 kg/m   GEN: A/Ox3; pleasant , NAD, elderly    HEENT:  [place]/AT,  EACs-clear, TMs-wnl, NOSE-clear, THROAT-clear, no lesions, no postnasal drip or exudate noted.   NECK:  Supple w/ fair ROM; no JVD; normal carotid impulses w/o bruits; no thyromegaly or nodules palpated; no lymphadenopathy.    RESP  Clear  P & A; w/o, wheezes/ rales/ or rhonchi. no accessory muscle use, no dullness to percussion  CARD:  RRR, no m/r/g, no peripheral edema, pulses intact, no cyanosis or clubbing.  GI:   Soft & nt; nml bowel sounds; no organomegaly or masses detected.   Musco: Warm bil, no deformities or joint swelling noted.   Neuro: alert, no focal deficits noted.    Skin: Warm, no lesions or rashes    Lab Results:  CBC  BMET   BNP  ProBNP No results  found for: PROBNP  Imaging: Dg Chest 2 View  Result Date: 11/04/2016 CLINICAL DATA:  Chronic chest pain and shortness of breath, presenting with acute worsening of shortness of breath. Personal history of right lower lobe lung cancer. One hundred twenty pack-year smoking history. Chronic left pleural effusion. EXAM: CHEST  2 VIEW COMPARISON:  CT chest 10/29/2015 and earlier. Chest x-rays 10/29/2015 and earlier. FINDINGS: AP semi-erect and lateral images were obtained. Cardiac silhouette mildly enlarged, unchanged. Thoracic aorta tortuous and atherosclerotic, unchanged. Hilar and mediastinal contours otherwise unremarkable. Chronic loculated left pleural effusion at the apex and base, unchanged, with associated chronic passive atelectasis involving the left lower lobe and to a lesser degree the left upper lobe. 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 No new pulmonary parenchymal abnormalities in the right lung. Right jugular Port-A-Cath tip in the lower SVC, unchanged. Mild degenerative changes involving the thoracic and upper lumbar spine. IMPRESSION: 1.  No acute cardiopulmonary disease. 2. Stable chronic loculated left pleural effusion and chronic passive atelectasis in the left lower lobe and left upper lobe. 3. 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 4.  Aortic Atherosclerosis (ICD10-170.0) Electronically Signed   By: Evangeline Dakin M.D.   On: 11/04/2016 11:29   Ct Abdomen Pelvis W Contrast  Result Date: 11/04/2016 CLINICAL DATA:  Abdominal pain and nausea. History of lung carcinoma EXAM: CT ABDOMEN AND PELVIS WITH CONTRAST TECHNIQUE: Multidetector CT imaging of the abdomen and pelvis was performed using the standard protocol following bolus administration of intravenous contrast. CONTRAST:  172mL ISOVUE-300 IOPAMIDOL (ISOVUE-300) INJECTION 61% COMPARISON:  PET-CT August 05, 2011 FINDINGS: Lower chest: There is scarring in the lung bases, with loculated  fluid and pleural thickening in the left base. There is a focal pericardial effusion. There are foci of coronary artery calcification. Hepatobiliary: There is hepatic steatosis. No focal liver lesions are evident. There is cholelithiasis. Gallbladder wall does not appear appreciably thickened. No biliary duct dilatation. Pancreas: No pancreatic mass or inflammatory focus. There is fatty infiltration within the pancreas. Spleen: No splenic lesions are evident. Adrenals/Urinary Tract: Adrenals appear unremarkable bilaterally. Kidneys bilaterally show no evident mass or hydronephrosis on either side. There is a 3 mm  calculus in the upper pole of the right kidney, nonobstructing. No ureteral calculi are evident. Urinary bladder is midline with wall thickness within normal limits. Stomach/Bowel: There are multiple sigmoid and descending colonic diverticula without diverticulitis. There is no appreciable bowel wall or mesenteric thickening. There is no bowel obstruction. No free air or portal venous air. Vascular/Lymphatic: There is atherosclerotic calcification in the aorta and iliac arteries. No aneurysm evident. There is moderate calcification in the proximal mesenteric arteries without mesenteric artery obstruction. There is calcification at the origin of the left and right renal arteries with apparent hemodynamically significant obstruction in these regions. There is no adenopathy appreciable in the abdomen or pelvis. Reproductive: Prostate and seminal vesicles are normal in size and contour. There are prostatic calculi inferiorly. No pelvic mass evident. Other: Appendix appears unremarkable. No abscess or ascites is evident in the abdomen or pelvis. There is a small ventral hernia containing only fat. There is a small ventral hernia containing only fat more superiorly, approximately 15 cm superior to the umbilicus. There is fat in each inguinal ring region. Musculoskeletal: There is degenerative change in the lumbar  spine. There is spinal stenosis at L4-5 due to bony hypertrophy and diffuse disc protrusion. There is also ligamentum flava hypertrophy bilaterally at this level. There are no blastic or lytic bone lesions. No intramuscular or abdominal wall lesion evident. IMPRESSION: 1. Extensive left-sided colonic diverticula without diverticulitis. No bowel obstruction. No abscess. Appendix appears normal. 2. Pericardial effusion. Loculated fluid left lung base. Bibasilar lung scarring. 3.  No renal or ureteral calculus.  No hydronephrosis. 4.  Cholelithiasis. 5.  Hepatic steatosis.  No focal liver lesion evident. 6. No renal or ureteral calculus. No hydronephrosis. There are prostatic calculi bilaterally. 7.  Small ventral hernias containing only fat. 8. Extensive aortoiliac atherosclerosis. Calcification is noted in the major mesenteric vessels. There appears to be hemodynamically significant obstructive disease due to calcification in both proximal renal arteries. In this regard, question whether patient is hypertensive. 9.  Spinal stenosis at L4-5, multifactorial in etiology. Aortic Atherosclerosis (ICD10-I70.0). Electronically Signed   By: Lowella Grip III M.D.   On: 11/04/2016 13:33   Dg Chest Port 1 View  Result Date: 11/05/2016 CLINICAL DATA:  Increased shortness of breath . EXAM: PORTABLE CHEST 1 VIEW COMPARISON:  11/04/2016 .  CT 10/29/2015. FINDINGS: PowerPort catheter with lead tip projected over superior vena cava. Stable cardiomegaly. Stable atelectasis/ consolidation left upper lung. Stable atelectasis both lung bases, left side greater than right. Stable left-sided pleural effusion. IMPRESSION: 1.  PowerPort catheter in stable position. 2. Stable atelectasis and consolidation left upper lung. Stable atelectasis both lung bases, left side greater right. Stable left-sided pleural effusion. Chest is unchanged from prior exams. Electronically Signed   By: Marcello Moores  Register   On: 11/05/2016 13:57      Assessment & Plan:   COPD (chronic obstructive pulmonary disease) with emphysema ([place]) Recent exacerbation now resolving   Plan  Patient Instructions  Continue on Duoneb Three times a day  .  Activity as tolerated.  Follow up with Dr Ashok Cordia in 6-8 weeks with chest xray .  Please contact office for sooner follow up if symptoms do not improve or worsen or seek emergency care         Rexene Edison, NP 11/18/2016

## 2016-11-19 DIAGNOSIS — F419 Anxiety disorder, unspecified: Secondary | ICD-10-CM | POA: Diagnosis not present

## 2016-11-19 DIAGNOSIS — K5793 Diverticulitis of intestine, part unspecified, without perforation or abscess with bleeding: Secondary | ICD-10-CM | POA: Diagnosis not present

## 2016-11-19 DIAGNOSIS — I2510 Atherosclerotic heart disease of native coronary artery without angina pectoris: Secondary | ICD-10-CM | POA: Diagnosis not present

## 2016-11-19 DIAGNOSIS — J439 Emphysema, unspecified: Secondary | ICD-10-CM | POA: Diagnosis not present

## 2016-11-19 DIAGNOSIS — I10 Essential (primary) hypertension: Secondary | ICD-10-CM | POA: Diagnosis not present

## 2016-11-19 DIAGNOSIS — M170 Bilateral primary osteoarthritis of knee: Secondary | ICD-10-CM | POA: Diagnosis not present

## 2016-11-19 NOTE — Progress Notes (Signed)
Note reviewed.  Sonia Baller Ashok Cordia, M.D. [facility] Pager:  [phone] After 3pm or if no response, call [phone] 6:37 AM 11/19/16

## 2016-11-25 DIAGNOSIS — L570 Actinic keratosis: Secondary | ICD-10-CM | POA: Diagnosis not present

## 2016-11-25 DIAGNOSIS — M179 Osteoarthritis of knee, unspecified: Secondary | ICD-10-CM | POA: Diagnosis not present

## 2016-11-25 DIAGNOSIS — J441 Chronic obstructive pulmonary disease with (acute) exacerbation: Secondary | ICD-10-CM | POA: Diagnosis not present

## 2016-11-27 DIAGNOSIS — J439 Emphysema, unspecified: Secondary | ICD-10-CM | POA: Diagnosis not present

## 2016-11-27 DIAGNOSIS — I10 Essential (primary) hypertension: Secondary | ICD-10-CM | POA: Diagnosis not present

## 2016-11-27 DIAGNOSIS — M170 Bilateral primary osteoarthritis of knee: Secondary | ICD-10-CM | POA: Diagnosis not present

## 2016-11-27 DIAGNOSIS — I2510 Atherosclerotic heart disease of native coronary artery without angina pectoris: Secondary | ICD-10-CM | POA: Diagnosis not present

## 2016-11-27 DIAGNOSIS — F419 Anxiety disorder, unspecified: Secondary | ICD-10-CM | POA: Diagnosis not present

## 2016-11-27 DIAGNOSIS — K5793 Diverticulitis of intestine, part unspecified, without perforation or abscess with bleeding: Secondary | ICD-10-CM | POA: Diagnosis not present

## 2016-12-01 ENCOUNTER — Ambulatory Visit (HOSPITAL_BASED_OUTPATIENT_CLINIC_OR_DEPARTMENT_OTHER): Payer: Medicare Other

## 2016-12-01 ENCOUNTER — Ambulatory Visit (HOSPITAL_BASED_OUTPATIENT_CLINIC_OR_DEPARTMENT_OTHER): Payer: Medicare Other | Admitting: Family

## 2016-12-01 ENCOUNTER — Other Ambulatory Visit (HOSPITAL_BASED_OUTPATIENT_CLINIC_OR_DEPARTMENT_OTHER): Payer: Medicare Other

## 2016-12-01 VITALS — BP 115/66 | HR 63 | Temp 97.6°F | Resp 16 | Wt 191.0 lb

## 2016-12-01 DIAGNOSIS — C3431 Malignant neoplasm of lower lobe, right bronchus or lung: Secondary | ICD-10-CM

## 2016-12-01 DIAGNOSIS — I259 Chronic ischemic heart disease, unspecified: Secondary | ICD-10-CM

## 2016-12-01 DIAGNOSIS — Z85118 Personal history of other malignant neoplasm of bronchus and lung: Secondary | ICD-10-CM

## 2016-12-01 DIAGNOSIS — Z85820 Personal history of malignant melanoma of skin: Secondary | ICD-10-CM

## 2016-12-01 DIAGNOSIS — R0602 Shortness of breath: Secondary | ICD-10-CM | POA: Diagnosis not present

## 2016-12-01 DIAGNOSIS — D693 Immune thrombocytopenic purpura: Secondary | ICD-10-CM

## 2016-12-01 DIAGNOSIS — Z95828 Presence of other vascular implants and grafts: Secondary | ICD-10-CM

## 2016-12-01 DIAGNOSIS — D696 Thrombocytopenia, unspecified: Secondary | ICD-10-CM

## 2016-12-01 DIAGNOSIS — C3490 Malignant neoplasm of unspecified part of unspecified bronchus or lung: Secondary | ICD-10-CM

## 2016-12-01 LAB — COMPREHENSIVE METABOLIC PANEL
ALBUMIN: 3.7 g/dL (ref 3.5–5.0)
ALK PHOS: 111 U/L (ref 40–150)
ALT: 24 U/L (ref 0–55)
AST: 30 U/L (ref 5–34)
Anion Gap: 9 mEq/L (ref 3–11)
BUN: 21.2 mg/dL (ref 7.0–26.0)
CHLORIDE: 106 meq/L (ref 98–109)
CO2: 24 mEq/L (ref 22–29)
Calcium: 10.2 mg/dL (ref 8.4–10.4)
Creatinine: 1.1 mg/dL (ref 0.7–1.3)
EGFR: 63 mL/min/{1.73_m2} — ABNORMAL LOW (ref >90)
GLUCOSE: 115 mg/dL (ref 70–140)
POTASSIUM: 4.5 meq/L (ref 3.5–5.1)
SODIUM: 140 meq/L (ref 136–145)
Total Bilirubin: 0.92 mg/dL (ref 0.20–1.20)
Total Protein: 7.7 g/dL (ref 6.4–8.3)

## 2016-12-01 LAB — CBC WITH DIFFERENTIAL (CANCER CENTER ONLY)
BASO#: 0 10*3/uL (ref 0.0–0.2)
BASO%: 0.6 % (ref 0.0–2.0)
EOS%: 3 % (ref 0.0–7.0)
Eosinophils Absolute: 0.2 10*3/uL (ref 0.0–0.5)
HCT: 48.8 % (ref 38.7–49.9)
HEMOGLOBIN: 16.1 g/dL (ref 13.0–17.1)
LYMPH#: 1.4 10*3/uL (ref 0.9–3.3)
LYMPH%: 19.4 % (ref 14.0–48.0)
MCH: 30.1 pg (ref 28.0–33.4)
MCHC: 33 g/dL (ref 32.0–35.9)
MCV: 91 fL (ref 82–98)
MONO#: 1 10*3/uL — ABNORMAL HIGH (ref 0.1–0.9)
MONO%: 13.3 % — AB (ref 0.0–13.0)
NEUT%: 63.7 % (ref 40.0–80.0)
NEUTROS ABS: 4.6 10*3/uL (ref 1.5–6.5)
Platelets: 264 10*3/uL (ref 145–400)
RBC: 5.34 10*6/uL (ref 4.20–5.70)
RDW: 14.5 % (ref 11.1–15.7)
WBC: 7.2 10*3/uL (ref 4.0–10.0)

## 2016-12-01 MED ORDER — SODIUM CHLORIDE 0.9% FLUSH
10.0000 mL | INTRAVENOUS | Status: DC | PRN
  Administered 2016-12-01: 10 mL via INTRAVENOUS
  Filled 2016-12-01: qty 10

## 2016-12-01 MED ORDER — HEPARIN SOD (PORK) LOCK FLUSH 100 UNIT/ML IV SOLN
500.0000 [IU] | Freq: Once | INTRAVENOUS | Status: AC
  Administered 2016-12-01: 500 [IU] via INTRAVENOUS
  Filled 2016-12-01: qty 5

## 2016-12-01 NOTE — Progress Notes (Signed)
Hematology and Oncology Follow Up Visit  [name] Fields [id number] 02-21-1935 82 y.o. 12/01/2016   Principle Diagnosis:  1. 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 He was hospitalized in early August with acute respiratory failure. He was treated with azithromycin, steroids and nebulizer treatments TID. He is now home and feeling much better but still has some mild SOB. Lung sounds are clear with good excursion bilaterally.  No fever, chills, n/v, rash/lesion, chest pain, palpitations or changes in bladder habits.  He has had some constipation while on Linzess and has not been taking it regularly.  He has a hiatal hernia and has had some issues with GERD. He has not been taking his zantac because he did not feel that is was working. He will try Zegerid.  He denies having had any episodes of bleeding, bruising or petechiae. No lymphadenopathy found on exam.  He is walking daily for exercise and using a cane for support. He has had no more falls or syncopal episodes. His vertigo has been better controlled with the Antivert.  No swelling, tenderness, numbness or tingling in his extremities at this time.  His appetite has been good and he is staying hydrated. His weight is down 4 lbs since we saw him earlier this month.   ECOG Performance Status: 1 - Symptomatic but completely ambulatory  Medications:  Allergies as of 12/01/2016      Reactions   Sertraline Hcl    Patient does not remember the reaction      Medication List       Accurate as of 12/01/16 10:49 AM. Always use your most recent med list.          albuterol (2.5 MG/3ML) 0.083% nebulizer solution Commonly known as:  PROVENTIL Take 3 mLs (2.5 mg total) by nebulization every 4  (four) hours as needed for wheezing.   albuterol 108 (90 Base) MCG/ACT inhaler Commonly known as:  PROVENTIL HFA;VENTOLIN HFA Inhale 2 puffs into the lungs every 6 (six) hours as needed for wheezing or shortness of breath.   aspirin EC 81 MG tablet Take 81 mg by mouth daily.   atorvastatin 20 MG tablet Commonly known as:  LIPITOR TAKE 1 TABLET BY MOUTH DAILY   buPROPion 150 MG 12 hr tablet Commonly known as:  WELLBUTRIN SR Take 150 mg by mouth daily.   dutasteride 0.5 MG capsule Commonly known as:  AVODART Take 0.5 mg by mouth daily.   furosemide 20 MG tablet Commonly known as:  LASIX Take 1 tablet (20 mg total) by mouth daily.   guaiFENesin 600 MG 12 hr tablet Commonly known as:  MUCINEX Take 1 tablet (600 mg total) by mouth 2 (two) times daily.   ipratropium-albuterol 0.5-2.5 (3) MG/3ML Soln Commonly known as:  DUONEB Take 3 mLs by nebulization 3 (three) times daily.   metoprolol tartrate 25 MG tablet Commonly known as:  LOPRESSOR TAKE 1/2 TABLET BY MOUTH TWICE DAILY   ranitidine 150 MG capsule Commonly known as:  ZANTAC Take 150 mg by mouth 3 (three) times a week.   tamsulosin 0.4 MG Caps capsule Commonly known as:  FLOMAX Take 0.4 mg by mouth daily with supper.   temazepam 30 MG capsule Commonly known as:  RESTORIL Take 30 mg by mouth at bedtime   venlafaxine XR 150 MG 24 hr capsule Commonly known as:  EFFEXOR-XR Take 150 mg by mouth daily.   Vitamin D3 2000 units Tabs Take 2,000 Units by mouth 2 (two) times daily.       Allergies:  Allergies  Allergen Reactions  . Sertraline Hcl     Patient does not remember the reaction    Past Medical History, Surgical history, Social history, and Family History were reviewed and updated.  Review of Systems: All other 10 point review of systems is negative.   Physical Exam:  weight is 191 lb (86.6 kg). His oral temperature is 97.6 F (36.4 C). His blood pressure is 115/66 and his pulse is 63. His  respiration is 16 and oxygen saturation is 99%.   Wt Readings from Last 3 Encounters:  12/01/16 191 lb (86.6 kg)  11/18/16 195 lb 11.2 oz (88.8 kg)  11/04/16 198 lb 10.2 oz (90.1 kg)    Ocular: Sclerae unicteric, pupils equal, round and reactive to light Ear-nose-throat: Oropharynx clear, dentition fair Lymphatic: No cervical, supraclavicular or axillary adenopathy Lungs no rales or rhonchi, good excursion bilaterally Heart regular rate and rhythm, no murmur appreciated Abd soft, nontender, positive bowel sounds, no liver or spleen tip palpated on exam, no fluid wave MSK no focal spinal tenderness, no joint edema Neuro: non-focal, well-oriented, appropriate affect Breasts: Deferred   Lab Results  Component Value Date   WBC 7.2 12/01/2016   HGB 16.1 12/01/2016   HCT 48.8 12/01/2016   MCV 91 12/01/2016   PLT 264 Platelet count consistent in citrate 12/01/2016   Lab Results  Component Value Date   FERRITIN 92 12/20/2014   IRON 95 12/20/2014   TIBC 405 12/20/2014   UIBC 310 12/20/2014   IRONPCTSAT 23 12/20/2014   Lab Results  Component Value Date   RETICCTPCT 1.8 12/20/2014   RBC 5.34 12/01/2016   Lab Results  Component Value Date   KAPLAMBRATIO 1.98 (L) 12/20/2014   No results found for: Kandis Cocking, IGMSERUM Lab Results  Component Value Date   TOTALPROTELP 6.9 12/20/2014   ALBUMINELP 3.4 12/20/2014   A1GS 0.3 12/20/2014   A2GS 0.9 12/20/2014   BETS 1.3 12/20/2014   GAMS 1.1 12/20/2014   MSPIKE Not Observed 12/20/2014   SPEI Comment 12/20/2014     Chemistry      Component Value Date/Time   NA 137 11/05/2016 0352   NA 141 07/31/2016 0958   K 4.1 11/05/2016 0352   K 4.1 07/31/2016 0958   CL 103 11/05/2016 0352   CL 104 12/28/2014 1349   CL 104 08/27/2012 1421   CO2 25 11/05/2016 0352   CO2 27 07/31/2016 0958   BUN 14 11/05/2016 0352   BUN 15.5 07/31/2016 0958   CREATININE 0.91 11/05/2016 0352   CREATININE 0.8 07/31/2016 0958      Component Value  Date/Time   CALCIUM 9.1 11/05/2016 0352   CALCIUM 9.3 07/31/2016 0958   ALKPHOS 114 11/04/2016 1011   ALKPHOS 91 07/31/2016 0958   AST 31 11/04/2016 1011   AST 19 07/31/2016 0958   ALT 24 11/04/2016 1011   ALT 12 07/31/2016 0958   BILITOT 1.1 11/04/2016 1011   BILITOT 1.00 07/31/2016 0958      Impression and Plan: Mr. Mckelvy is a very pleasant 82 yo caucasian gentleman with history of both right and left lung cancers as well as melanoma. His most recent malignancy was stage I lung  cancer diagnosed in April 2013. He had stereotactic radiosurgery that same year in July. He is getting over an episode of acute respiratory failure from earlier this month. His energy is improving and his SOB is better. He is still doing neb treatments three times a day.  His counts are stable. No anemia and platelet count is 264.  We will plan to see him back again in another 6 weeks for repeat lab work, port flush, follow-up and also repeat a chest xray that same day. He is in agreement with the plan.  He will contact our office with any questions or concerns. We can certainy see him sooner if need be.   Eliezer Bottom, NP 8/27/201810:49 AM

## 2016-12-01 NOTE — Patient Instructions (Signed)
Implanted Port Insertion, Care After °This sheet gives you information about how to care for yourself after your procedure. Your health care provider may also give you more specific instructions. If you have problems or questions, contact your health care provider. °What can I expect after the procedure? °After your procedure, it is common to have: °· Discomfort at the port insertion site. °· Bruising on the skin over the port. This should improve over 3-4 days. ° °Follow these instructions at home: °Port care °· After your port is placed, you will get a manufacturer's information card. The card has information about your port. Keep this card with you at all times. °· Take care of the port as told by your health care provider. Ask your health care provider if you or a family member can get training for taking care of the port at home. A home health care nurse may also take care of the port. °· Make sure to remember what type of port you have. °Incision care °· Follow instructions from your health care provider about how to take care of your port insertion site. Make sure you: °? Wash your hands with soap and water before you change your bandage (dressing). If soap and water are not available, use hand sanitizer. °? Change your dressing as told by your health care provider. °? Leave stitches (sutures), skin glue, or adhesive strips in place. These skin closures may need to stay in place for 2 weeks or longer. If adhesive strip edges start to loosen and curl up, you may trim the loose edges. Do not remove adhesive strips completely unless your health care provider tells you to do that. °· Check your port insertion site every day for signs of infection. Check for: °? More redness, swelling, or pain. °? More fluid or blood. °? Warmth. °? Pus or a bad smell. °General instructions °· Do not take baths, swim, or use a hot tub until your health care provider approves. °· Do not lift anything that is heavier than 10 lb (4.5  kg) for a week, or as told by your health care provider. °· Ask your health care provider when it is okay to: °? Return to work or school. °? Resume usual physical activities or sports. °· Do not drive for 24 hours if you were given a medicine to help you relax (sedative). °· Take over-the-counter and prescription medicines only as told by your health care provider. °· Wear a medical alert bracelet in case of an emergency. This will tell any health care providers that you have a port. °· Keep all follow-up visits as told by your health care provider. This is important. °Contact a health care provider if: °· You cannot flush your port with saline as directed, or you cannot draw blood from the port. °· You have a fever or chills. °· You have more redness, swelling, or pain around your port insertion site. °· You have more fluid or blood coming from your port insertion site. °· Your port insertion site feels warm to the touch. °· You have pus or a bad smell coming from the port insertion site. °Get help right away if: °· You have chest pain or shortness of breath. °· You have bleeding from your port that you cannot control. °Summary °· Take care of the port as told by your health care provider. °· Change your dressing as told by your health care provider. °· Keep all follow-up visits as told by your health care provider. °  This information is not intended to replace advice given to you by your health care provider. Make sure you discuss any questions you have with your health care provider. °Document Released: 01/12/2013 Document Revised: 02/13/2016 Document Reviewed: 02/13/2016 °Elsevier Interactive Patient Education © 2017 [employer] ° °

## 2016-12-09 ENCOUNTER — Other Ambulatory Visit: Payer: Self-pay | Admitting: Cardiology

## 2016-12-16 ENCOUNTER — Encounter: Payer: Self-pay | Admitting: Nurse Practitioner

## 2016-12-16 ENCOUNTER — Ambulatory Visit (INDEPENDENT_AMBULATORY_CARE_PROVIDER_SITE_OTHER): Payer: Medicare Other | Admitting: Nurse Practitioner

## 2016-12-16 VITALS — BP 132/88 | HR 80 | Ht 70.0 in | Wt 198.1 lb

## 2016-12-16 DIAGNOSIS — I259 Chronic ischemic heart disease, unspecified: Secondary | ICD-10-CM

## 2016-12-16 DIAGNOSIS — R0602 Shortness of breath: Secondary | ICD-10-CM

## 2016-12-16 DIAGNOSIS — J438 Other emphysema: Secondary | ICD-10-CM | POA: Diagnosis not present

## 2016-12-16 DIAGNOSIS — E7800 Pure hypercholesterolemia, unspecified: Secondary | ICD-10-CM

## 2016-12-16 NOTE — Patient Instructions (Addendum)
We will be checking the following labs today - NONE   Medication Instructions:    Continue with your current medicines.     Testing/Procedures To Be Arranged:  N/A  Follow-Up:   See me in about 6 months. You have an appointment scheduled with Cecille Rubin 06/15/17 at 10:30AM.    Other Special Instructions:   Keep up the walking every day!    If you need a refill on your cardiac medications before your next appointment, please call your pharmacy.   Call the [facility] office at [phone] if you have any questions, problems or concerns.

## 2016-12-16 NOTE — Progress Notes (Signed)
CARDIOLOGY OFFICE NOTE  Date:  12/16/2016    [name] Fields Date of Birth: 09-Jan-1935 Medical Record #[id number]  PCP:  [name], MD  Cardiologist:  [name]  Chief Complaint  Patient presents with  . Coronary Artery Disease    Follow up visit - seen for Dr. Marlou Fields    History of Present Illness: [name] Fields is a 82 y.o. male who presents today for a follow up visit. Seen for Dr. Marlou Fields. He is a former patient of Dr. Susa Simmonds that I cared for.   He has a history of coronary artery disease with prior lateral wall MI status post multiple stents in the past. Other issues include lung cancer in both sides (treated with radiation), melanoma, fibrosis, recurrent pleural effusion. He has had a pericardial window for an effusion back in 2011.   I saw him back in July of 2017 - he had been referred back for shortness of breath. Productive cough - COPD - I sent him back to pulmonary for evaluation/treatment - seemed more pulmonary related/radiation effects.   Did not understand how to use his Spiriva. Some swelling - getting too much salt. I added some low dose Lasix. He was better on return.   I last saw him back in March - very limited by his breathing - has COPD/fibrosis - cardiac status was felt to be ok.   Admitted about 6 weeks ago - hypoxic - triggered unclear.   Comes in today. Here alone. He says he is "getting along". Was hospitalized about 6 weeks ago with progressive dyspnea - did not require oxygen at home. His breathing has improved but at baseline he is short of breath. No chest pain. Admits his memory is getting worse. Family helps with lots of his household needs. Tells me he is going to have some shots in his knees. He has seen pulmonary and has follow up there next month. He is actually walking 30 minutes - "not at a great speed" - but has not been able to do this in over a year. Feels like this is helping him. No chest pain reported with his  walking.   Past Medical History:  Diagnosis Date  . Anxiety    takes wellbutrin daily  . Arthritis    knees  . COPD (chronic obstructive pulmonary disease) ([place])   . Diverticulosis   . Emphysema of lung ([place])   . GERD (gastroesophageal reflux disease)   . Hearing loss   . Hx of radiation therapy 09/29/11;10/01/11;10/03/11;10/06/11;10/08/11;   RLLlung,50Gy/61fx - SBRT  . Hyperlipidemia   . Hypertension   . Hypokinesia    MILD INFERIOR  . IHD (ischemic heart disease)    Remote MI in 2001 with stent to the LCX  . Lactose intolerance   . Non-small cell carcinoma of lung ([place])    Dx in October of 2010  . Normal nuclear stress test 2009   EF 63%. No ischemia. 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  . Pericardial effusion July 2011   s/p pericardial window; last echo in August 2012 with minimal effusion  . Pleural effusion, left   . SOB (shortness of breath)    Chronic with known fibrosis  . Status post chemotherapy 06/04/2009 -  09/06/2009    4 cycles of Cisplatin and Gemcitabine with Neulasta Support   . Vertigo    hx. of- ambulates with cane for safety.    Past Surgical History:  Procedure Laterality Date  . CARDIAC CATHETERIZATION  07/11/2003   EF 60%, REPEAT, WHICH SHOWED 30% PROXIMAL NARROWING IN THE RIGHT  CORONARY ARTERY  . CARDIOVASCULAR STRESS TEST  05/2007   EF 63%  . CATARACT EXTRACTION, BILATERAL Bilateral   . COLONOSCOPY WITH PROPOFOL N/A 01/30/2015   Procedure: COLONOSCOPY WITH PROPOFOL;  Surgeon: Garlan Fair, MD;  Location: [facility];  Service: Endoscopy;  Laterality: N/A;  . CORONARY STENT PLACEMENT  07/1999   STENT TO THE LEFT CIRCUMFLEX  . HERNIA REPAIR     as an adult- R inguinal   . PERICARDIAL WINDOW  July 2011  . TRANSTHORACIC ECHOCARDIOGRAM  02/22/2010   EF 55-60%     Medications: Current Meds  Medication Sig  . albuterol (PROVENTIL HFA;VENTOLIN HFA) 108 (90 Base) MCG/ACT inhaler Inhale 2 puffs into the lungs every 6 (six) hours as needed for wheezing or  shortness of breath.  Marland Kitchen albuterol (PROVENTIL) (2.5 MG/3ML) 0.083% nebulizer solution Take 3 mLs (2.5 mg total) by nebulization every 4 (four) hours as needed for wheezing.  Marland Kitchen aspirin EC 81 MG tablet Take 81 mg by mouth daily.  Marland Kitchen atorvastatin (LIPITOR) 20 MG tablet TAKE 1 TABLET BY MOUTH DAILY (Patient taking differently: Take 20 mg by mouth once a day)  . buPROPion (WELLBUTRIN SR) 150 MG 12 hr tablet Take 150 mg by mouth daily.   . Cholecalciferol (VITAMIN D3) 2000 units TABS Take 2,000 Units by mouth 2 (two) times daily.   Marland Kitchen dutasteride (AVODART) 0.5 MG capsule Take 0.5 mg by mouth daily.   . furosemide (LASIX) 20 MG tablet Take 1 tablet (20 mg total) by mouth daily.  Marland Kitchen guaiFENesin (MUCINEX) 600 MG 12 hr tablet Take 1 tablet (600 mg total) by mouth 2 (two) times daily.  Marland Kitchen ipratropium-albuterol (DUONEB) 0.5-2.5 (3) MG/3ML SOLN Take 3 mLs by nebulization 3 (three) times daily.  . metoprolol tartrate (LOPRESSOR) 25 MG tablet TAKE 1/2 TABLET BY MOUTH TWICE DAILY  . Omeprazole-Sodium Bicarbonate (ZEGERID) 20-1100 MG CAPS capsule Take 1 capsule by mouth daily before breakfast.  . Tamsulosin HCl (FLOMAX) 0.4 MG CAPS Take 0.4 mg by mouth daily with supper.   . temazepam (RESTORIL) 30 MG capsule Take 30 mg by mouth at bedtime  . venlafaxine XR (EFFEXOR-XR) 150 MG 24 hr capsule Take 150 mg by mouth daily.      Allergies: Allergies  Allergen Reactions  . Sertraline Hcl     Patient does not remember the reaction    Social History: The patient  reports that he quit smoking about 22 years ago. His smoking use included Cigarettes. He has a 120.00 pack-year smoking history. He has never used smokeless tobacco. He reports that he does not drink alcohol or use drugs.   Family History: The patient's family history includes Lung cancer in his father; Stroke in his sister.   Review of Systems: Please see the history of present illness.   Otherwise, the review of systems is positive for none.   All other  systems are reviewed and negative.   Physical Exam: VS:  BP 132/88   Pulse 80   Ht 5\' 10"  (1.778 m)   Wt 198 lb 1.9 oz (89.9 kg)   BMI 28.43 kg/m  .  BMI Body mass index is 28.43 kg/m.  Wt Readings from Last 3 Encounters:  12/16/16 198 lb 1.9 oz (89.9 kg)  12/01/16 191 lb (86.6 kg)  11/18/16 195 lb 11.2 oz (88.8 kg)    General: Pleasant. Well developed, well nourished and in no acute distress.  He seems to struggle a little with answering questions.  HEENT: Normal.  Neck: Supple, no JVD, carotid bruits, or masses noted.  Cardiac: Regular rate and rhythm. No murmurs, rubs, or gallops. No edema.  Respiratory:  Lungs are clear to auscultation bilaterally with normal work of breathing.  GI: Soft and nontender.  MS: No deformity or atrophy. Gait and ROM intact.  Skin: Warm and dry. Color is normal.  Neuro:  Strength and sensation are intact and no gross focal deficits noted.  Psych: Alert, appropriate and with normal affect.   LABORATORY DATA:  EKG:  EKG is not ordered today.  Lab Results  Component Value Date   WBC 7.2 12/01/2016   HGB 16.1 12/01/2016   HCT 48.8 12/01/2016   PLT 264 Platelet count consistent in citrate 12/01/2016   GLUCOSE 115 12/01/2016   CHOL 147 06/24/2016   TRIG 158 (H) 06/24/2016   HDL 70 06/24/2016   LDLCALC 45 06/24/2016   ALT 24 12/01/2016   AST 30 12/01/2016   NA 140 12/01/2016   K 4.5 12/01/2016   CL 103 11/05/2016   CREATININE 1.1 12/01/2016   BUN 21.2 12/01/2016   CO2 24 12/01/2016   TSH 5.020 (H) 06/24/2016   INR 1.17 12/20/2014   HGBA1C 5.6 12/20/2014     BNP (last 3 results) No results for input(s): BNP in the last 8760 hours.  ProBNP (last 3 results) No results for input(s): PROBNP in the last 8760 hours.   Other Studies Reviewed Today:  Myoview Study Highlights from 04/2015    Nuclear stress EF: 56%.  The study is normal.  This is a low risk study.  The left ventricular ejection fraction is normal  (55-65%).  Normal resting and stress perfusion EF 56%    CXR IMPRESSION: Chronic volume loss with loculated effusions on the left. Scarring right base. Stable cardiac silhouette. No new opacity. Overall stable study. Electronically Signed By: Lowella Grip III M.D. On: 10/29/2015 16:13  CT CHEST  1. Today's study demonstrates stable postradiation changes of mass-like fibrosis both in the paramediastinal aspect of the left upper lobe and in the central infrahilar region of the right lower lobe. No definite findings to suggest local recurrence of disease or definite metastatic disease in the thorax. 2. There is a new nondisplaced fracture of the posterolateral aspect of the left sixth rib. This may be pathologic secondary to postradiation osteonecrosis given the proximity to the treated area in the left lung. The possibility of a metastatic lesion with nondisplaced fracture is not entirely excluded, however, and close attention on followup studies is recommended. 3. Small partially loculated pericardial effusion which exerts slight mass effect upon the right heart. These findings are very similar to prior examinations. No pericardial calcification. 4. Aortic atherosclerosis, in addition to left main and 3 vessel coronary artery disease, with evidence of scarring from prior left circumflex coronary artery territory myocardial infarction, as above. Assessment for potential risk factor modification, dietary therapy or pharmacologic therapy may be warranted, if clinically indicated. 5. Additional incidental findings, as above. Electronically Signed By: Vinnie Langton M.D. On: 10/29/2015 16:54   Echo Study Conclusions 12/2014  - Left ventricle: The cavity size was normal. Wall thickness was increased in a pattern of moderate LVH. Systolic function was normal. The estimated ejection fraction was in the range of 60% to 65%. Wall motion was normal; there  were no regional wall motion abnormalities. Doppler parameters are consistent with abnormal left ventricular relaxation (grade 1 diastolic dysfunction). The  E/e&' ratio is >15, suggesting elevated LV filling pressure. - Aortic valve: Mildly calcified- there may be fusion of the non and right coronary cusps. There was mild regurgitation. - Mitral valve: Mildly thickened leaflets . There was mild regurgitation. - Left atrium: The atrium was normal in size. - Tricuspid valve: There was mild regurgitation. - Pulmonary arteries: PA peak pressure: 37 mm Hg (S). - Inferior vena cava: The vessel was normal in size. The respirophasic diameter changes were in the normal range (>= 50%), consistent with normal central venous pressure. - Pericardium, extracardiac: A trivial pericardial effusion was identified. Features were not consistent with tamponade physiology.  Impressions:  - Compared to a prior echo in 2012, there are a few changes. LV filling pressures appear elevated. There is a persistent trivial pericardial effusion without tamponade features. RVSP is higher and there is now moderate LVH.   Assessment/Plan: 1. Chronic shortness of breath - recent admission for hypoxia - most likely is multifactorial and this remains his most limiting factor. Has some degree of diastolic HF - now on some low dose Lasix - weight is stable. Recent labs noted.   2. CAD - no active chest pain noted. Would favor more conservative management. Low risk Myoview from January of 2017. He is surprisingly walking - no chest pain - I have asked him to try and continue.   3. Memory impairment - he has really declined over the last several years. He admits his memory is getting worse.    4. Past pericardial effusion - s/p window - last CT findings basically unchanged.   5. Prior lung cancers/melanoma - followed by Dr. Marin Olp in [place].  6. HLD - on statin therapy  Current  medicines are reviewed with the patient today.  The patient does not have concerns regarding medicines other than what has been noted above.  The following changes have been made:  See above.  Labs/ tests ordered today include:   No orders of the defined types were placed in this encounter.    Disposition:   FU with me in 6 months.   Patient is agreeable to this plan and will call if any problems develop in the interim.   SignedTruitt Merle, NP  12/16/2016 11:37 AM  [facility] [address] [place], [place]  [address] Phone: [phone] Fax: [phone]

## 2016-12-25 DIAGNOSIS — J441 Chronic obstructive pulmonary disease with (acute) exacerbation: Secondary | ICD-10-CM | POA: Diagnosis not present

## 2016-12-25 DIAGNOSIS — M170 Bilateral primary osteoarthritis of knee: Secondary | ICD-10-CM | POA: Diagnosis not present

## 2017-01-12 ENCOUNTER — Other Ambulatory Visit: Payer: Medicare Other

## 2017-01-12 ENCOUNTER — Other Ambulatory Visit (HOSPITAL_BASED_OUTPATIENT_CLINIC_OR_DEPARTMENT_OTHER): Payer: Medicare Other

## 2017-01-12 ENCOUNTER — Ambulatory Visit: Payer: Medicare Other | Admitting: Hematology & Oncology

## 2017-01-13 ENCOUNTER — Ambulatory Visit (INDEPENDENT_AMBULATORY_CARE_PROVIDER_SITE_OTHER): Payer: Medicare Other | Admitting: Pulmonary Disease

## 2017-01-13 ENCOUNTER — Encounter: Payer: Self-pay | Admitting: Pulmonary Disease

## 2017-01-13 VITALS — BP 128/78 | HR 85 | Ht 70.0 in | Wt 189.4 lb

## 2017-01-13 DIAGNOSIS — J449 Chronic obstructive pulmonary disease, unspecified: Secondary | ICD-10-CM | POA: Diagnosis not present

## 2017-01-13 DIAGNOSIS — I259 Chronic ischemic heart disease, unspecified: Secondary | ICD-10-CM | POA: Diagnosis not present

## 2017-01-13 DIAGNOSIS — Z23 Encounter for immunization: Secondary | ICD-10-CM

## 2017-01-13 DIAGNOSIS — C3490 Malignant neoplasm of unspecified part of unspecified bronchus or lung: Secondary | ICD-10-CM

## 2017-01-13 MED ORDER — IPRATROPIUM-ALBUTEROL 0.5-2.5 (3) MG/3ML IN SOLN: 3.0000 mL | RESPIRATORY_TRACT | 3 refills | Status: DC | PRN

## 2017-01-13 MED ORDER — FORMOTEROL FUMARATE 20 MCG/2ML IN NEBU: 20.0000 ug | INHALATION_SOLUTION | Freq: Two times a day (BID) | RESPIRATORY_TRACT | 0 refills | Status: DC

## 2017-01-13 MED ORDER — BUDESONIDE 0.25 MG/2ML IN SUSP: 0.2500 mg | Freq: Two times a day (BID) | RESPIRATORY_TRACT | 3 refills | Status: DC

## 2017-01-13 NOTE — Progress Notes (Signed)
Subjective:    Patient ID: [name] Fields, male    DOB: 13-Dec-1934, 82 y.o.   MRN: [id number]  C.C.:  Follow-up for Severe COPD w/ Emphysema, NSCLC, & Moderate Restrictive Lung Disease.  HPI  Patient last seen in our office in August recovering from a COPD exacerbation.  Severe COPD with emphysema: Previously had discontinued all inhaler medications without perceived symptomatic benefit. Carrier for alpha-1 antitrypsin:  MS. At last appointment in August patient was using Duonebs 3 times a day. He has continued to use his nebulizer TID feeling like it helps him more. He reports he is walking 30 minutes every morning. He reports he continues to have dyspnea on exertion. He did feel more dyspneic during his routine walk on Saturday. He denies any coughing and reports mild wheezing. He is using his rescue inhaler once a week.   Non-small cell lung cancer: Followed by Dr.Ennever with medical oncology. Previously treated with XRT & chemotherapy. He reports his missed his appointment yesterday and had to reschedule.   Moderate restrictive lung disease: Likely due to postradiation fibrosis in the left lung. Deferring further imaging and workup at this time.   Review of Systems He denies any fever, chills, or sweats. Recently did have nausea with diarrhea & emesis. No chest pain, tightness, or pressure.   Allergies  Allergen Reactions  . Sertraline Hcl     Patient does not remember the reaction    Current Outpatient Prescriptions on File Prior to Visit  Medication Sig Dispense Refill  . albuterol (PROVENTIL HFA;VENTOLIN HFA) 108 (90 Base) MCG/ACT inhaler Inhale 2 puffs into the lungs every 6 (six) hours as needed for wheezing or shortness of breath. 1 Inhaler 2  . albuterol (PROVENTIL) (2.5 MG/3ML) 0.083% nebulizer solution Take 3 mLs (2.5 mg total) by nebulization every 4 (four) hours as needed for wheezing. 75 mL 12  . aspirin EC 81 MG tablet Take 81 mg by mouth daily.    Marland Kitchen atorvastatin  (LIPITOR) 20 MG tablet TAKE 1 TABLET BY MOUTH DAILY (Patient taking differently: Take 20 mg by mouth once a day) 90 tablet 1  . buPROPion (WELLBUTRIN SR) 150 MG 12 hr tablet Take 150 mg by mouth daily.     . Cholecalciferol (VITAMIN D3) 2000 units TABS Take 2,000 Units by mouth 2 (two) times daily.     Marland Kitchen dutasteride (AVODART) 0.5 MG capsule Take 0.5 mg by mouth daily.     . furosemide (LASIX) 20 MG tablet Take 1 tablet (20 mg total) by mouth daily. 30 tablet 0  . guaiFENesin (MUCINEX) 600 MG 12 hr tablet Take 1 tablet (600 mg total) by mouth 2 (two) times daily. 20 tablet 0  . ipratropium-albuterol (DUONEB) 0.5-2.5 (3) MG/3ML SOLN Take 3 mLs by nebulization 3 (three) times daily. 360 mL 0  . metoprolol tartrate (LOPRESSOR) 25 MG tablet TAKE 1/2 TABLET BY MOUTH TWICE DAILY 90 tablet 1  . Omeprazole-Sodium Bicarbonate (ZEGERID) 20-1100 MG CAPS capsule Take 1 capsule by mouth daily before breakfast.    . Tamsulosin HCl (FLOMAX) 0.4 MG CAPS Take 0.4 mg by mouth daily with supper.     . temazepam (RESTORIL) 30 MG capsule Take 30 mg by mouth at bedtime    . venlafaxine XR (EFFEXOR-XR) 150 MG 24 hr capsule Take 150 mg by mouth daily.   1   No current facility-administered medications on file prior to visit.     Past Medical History:  Diagnosis Date  . Anxiety  takes wellbutrin daily  . Arthritis    knees  . COPD (chronic obstructive pulmonary disease) ([place])   . Diverticulosis   . Emphysema of lung ([place])   . GERD (gastroesophageal reflux disease)   . Hearing loss   . Hx of radiation therapy 09/29/11;10/01/11;10/03/11;10/06/11;10/08/11;   RLLlung,50Gy/47fx - SBRT  . Hyperlipidemia   . Hypertension   . Hypokinesia    MILD INFERIOR  . IHD (ischemic heart disease)    Remote MI in 2001 with stent to the LCX  . Lactose intolerance   . Non-small cell carcinoma of lung ([place])    Dx in October of 2010  . Normal nuclear stress test 2009   EF 63%. No ischemia. 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  . Pericardial  effusion July 2011   s/p pericardial window; last echo in August 2012 with minimal effusion  . Pleural effusion, left   . SOB (shortness of breath)    Chronic with known fibrosis  . Status post chemotherapy 06/04/2009 -  09/06/2009    4 cycles of Cisplatin and Gemcitabine with Neulasta Support   . Vertigo    hx. of- ambulates with cane for safety.    Past Surgical History:  Procedure Laterality Date  . CARDIAC CATHETERIZATION  07/11/2003   EF 60%, REPEAT, WHICH SHOWED 30% PROXIMAL NARROWING IN THE RIGHT  CORONARY ARTERY  . CARDIOVASCULAR STRESS TEST  05/2007   EF 63%  . CATARACT EXTRACTION, BILATERAL Bilateral   . COLONOSCOPY WITH PROPOFOL N/A 01/30/2015   Procedure: COLONOSCOPY WITH PROPOFOL;  Surgeon: Garlan Fair, MD;  Location: [facility];  Service: Endoscopy;  Laterality: N/A;  . CORONARY STENT PLACEMENT  07/1999   STENT TO THE LEFT CIRCUMFLEX  . HERNIA REPAIR     as an adult- R inguinal   . PERICARDIAL WINDOW  July 2011  . TRANSTHORACIC ECHOCARDIOGRAM  02/22/2010   EF 55-60%    Family History  Problem Relation Age of Onset  . Lung cancer Father   . Stroke Sister   . Heart disease Neg Hx   . Anesthesia problems Neg Hx   . Lung disease Neg Hx     Social History   Social History  . Marital status: Single    Spouse name: N/A  . Number of children: N/A  . Years of education: N/A   Occupational History  . retired Retired    [employer]   Social History Main Topics  . Smoking status: Former Smoker    Packs/day: 3.00    Years: 40.00    Types: Cigarettes    Quit date: 04/07/1994  . Smokeless tobacco: Never Used  . Alcohol use No  . Drug use: No  . Sexual activity: No   Other Topics Concern  . None   Social History Narrative   Originally from [place]. Previously worked for [employer]. Currently has 2 dogs. No bird or mold exposure.       Objective:   Physical Exam BP 128/78 (BP Location: Left Arm, Cuff Size: Normal)   Pulse 85   Ht 5\' 10"  (1.778 m)   Wt 189 lb 6 oz  (85.9 kg)   SpO2 95%   BMI 27.17 kg/m   General:  Awake. Alert. No distress. Comfortable. Integument:  Warm. Dry. No rash on exposed skin.  Extremities:  No cyanosis.  HEENT:  Mild bilateral nasal turbinate swelling right greater than left. No oral ulcers. Moist mucous membranes. Cardiovascular:  Regular rate. No edema. Unable to appreciate JVD.  Pulmonary:  Symmetrically decreased aeration. Normal work of breathing on room air. Prolonged exhalation phase. Abdomen: Soft. Normal bowel sounds. Mildly protuberant. Musculoskeletal:  Normal bulk and tone. No joint deformity or effusion appreciated. Neurological:  Cranial nerves 2-12 grossly in tact. No meningismus. Moving all 4 extremities equally.   PFT 03/20/16: FVC 1.81 L (46%) FEV1 1.16 L (41%) FEV1/FVC 0.64 FEF 25-75 0.65 L (33%) negative bronchodilator response 11/05/15: FVC 1.86 L (47%) FEV1 1.22 L (43%) FEV1/FVC 0.66 FEF 25-75 0.73 L (37%) negative bronchodilator response TLC 4.01 L (88%) RV 86% ERV 33% DLCO uncorrected 32%  6MWT 01/13/17:  Walked 3 laps / Baseline Sat 97% on RA / Nadir Sat 96% on RA (paused briefly w/ dyspnea after 2nd lap) 03/20/16:  Walked 218 meters / Baseline Sat 97% on RA / Nadir Sat 97% on RA (paused for 15 seconds)  IMAGING PORT CXR 11/05/16 (personally reviewed by me):  No significant change compared with previous x-ray imaging. Volume loss on the left with pleural effusion and opacification. Right-sided port in place. Heart/medial contours otherwise normal.  CTA CHEST 10/29/15 (previously reviewed by me): Heart normal in size. Loculated pericardial effusion noted. Right internal jugular Port-A-Cath noted. No pathologic mediastinal adenopathy. Paramediastinal opacity/mass in left upper lobe that is chronic. Atelectasis of left upper lobe is also appreciated along with pleural thickening & volume loss in the left lung. Small subpleural nodules noted bilaterally that are all subcentimeter. No obvious progression.  Diffuse centrilobular & paraseptal emphysema.  CARDIAC TTE (12/20/14): Moderate LVH with EF 60-65%. No regional wall motion abnormalities. Grade 1 diastolic dysfunction. LA & RA normal in size. RV normal in size and function. Pulmonary artery systolic pressure 37 mmHg. Mild aortic regurgitation. No aortic dilatation. Mild mitral regurgitation. Trivial pulmonic regurgitation. Mild tricuspid regurgitation. Trivial pericardial effusion.  LABS 12/18/15 Alpha-1 antitrypsin: MS (136)  12/03/15 CBC: 8.2/15.7/47.3/251 BMP: 141/4.4/104/27/19.6/1.0/92/9.9 LFT: 2.9/7.9/0.97/121/20/13 LDH:  176  10/30/15 BNP:  124.6  10/26/09 ABG on 2 L/m:  7.38 / 45 / 75 / 95%    Assessment & Plan:  82 y.o. male with severe COPD with emphysema. Patient's previous x-ray in August showed no significant change compared with previous imaging. Additionally, the patient has increased his daily exercise regimen to 30 minutes of continuous walking per his report. His walk test today shows no significant desaturation or oxygen requirement with a short ambulation in the office. This is certainly encouraging. I do believe he may benefit from long-acting bronchodilator therapy as well as a low-dose inhaled corticosteroid. As such, I'm initiating a nebulizer regimen given his previous lack of symptomatic benefit from inhalers. I instructed the patient to resume his previous nebulizer regimen if the proposed changes are ineffective or too costly. I instructed the patient to contact our office if he had any new breathing problems or questions before his next appointment.  1. Severe COPD with emphysema:  Attempting to transition from Duonebs 3 times a day to Pulmicort 0.25 mg & Perforomist nebulized twice daily. Patient counseled on appropriate oral hygiene. He will revert back to Duonebs if his medications too expensive or breathing significantly worsens. 2. Non-small cell lung cancer: Patient to reschedule follow-up with medical  oncology. 3. Health maintenance: Status post Pneumovax 27 January 2010 & Prevnar 19 December 2015. Administering high-dose influenza vaccine today. 4. Follow-up: Return to clinic in 3 months or sooner if needed.  Sonia Baller Ashok Cordia, M.D. [facility] Pager:  [phone] After 3pm or if no response, call [phone] 10:16 AM 01/13/17

## 2017-01-13 NOTE — Patient Instructions (Addendum)
   We are changing your Duoneb medication (Albuterol/Ipratroprium) to an as needed medication. You can use this medicine every 4 hours as needed for any coughing, wheezing or shortness of breath.  We are starting you on Pulmicort (also called Budesonide) and Perforomist to be used in your nebulizer twice daily. If you feel your breathing is getting worse on these or they're too expensive go back to using the Duoneb three times daily as you have been.  Remember to remove any dentures or partials you have before you use your Pulmicort (Budesonide). Remember to brush your teeth & tongue after you use it as well as rinse, gargle & spit to keep from getting thrush in your mouth or on your tongue (a white film).   Call us if you have any new breathing problems or questions before your next appointment.

## 2017-02-02 ENCOUNTER — Ambulatory Visit (HOSPITAL_BASED_OUTPATIENT_CLINIC_OR_DEPARTMENT_OTHER): Payer: Medicare Other | Admitting: Hematology & Oncology

## 2017-02-02 ENCOUNTER — Other Ambulatory Visit (HOSPITAL_BASED_OUTPATIENT_CLINIC_OR_DEPARTMENT_OTHER): Payer: Medicare Other

## 2017-02-02 ENCOUNTER — Ambulatory Visit (HOSPITAL_BASED_OUTPATIENT_CLINIC_OR_DEPARTMENT_OTHER)
Admission: RE | Admit: 2017-02-02 | Discharge: 2017-02-02 | Disposition: A | Discharge status: Home / Self Care | Payer: Medicare Other | Source: Ambulatory Visit | Attending: Family | Admitting: Family

## 2017-02-02 ENCOUNTER — Ambulatory Visit (HOSPITAL_BASED_OUTPATIENT_CLINIC_OR_DEPARTMENT_OTHER): Payer: Medicare Other

## 2017-02-02 VITALS — BP 122/77 | HR 101 | Temp 98.1°F | Resp 18 | Wt 191.1 lb

## 2017-02-02 DIAGNOSIS — R0602 Shortness of breath: Secondary | ICD-10-CM | POA: Diagnosis not present

## 2017-02-02 DIAGNOSIS — Z85118 Personal history of other malignant neoplasm of bronchus and lung: Secondary | ICD-10-CM

## 2017-02-02 DIAGNOSIS — Z85820 Personal history of malignant melanoma of skin: Secondary | ICD-10-CM | POA: Diagnosis not present

## 2017-02-02 DIAGNOSIS — C3490 Malignant neoplasm of unspecified part of unspecified bronchus or lung: Secondary | ICD-10-CM | POA: Diagnosis not present

## 2017-02-02 DIAGNOSIS — I259 Chronic ischemic heart disease, unspecified: Secondary | ICD-10-CM | POA: Diagnosis not present

## 2017-02-02 DIAGNOSIS — J984 Other disorders of lung: Secondary | ICD-10-CM | POA: Insufficient documentation

## 2017-02-02 DIAGNOSIS — J9600 Acute respiratory failure, unspecified whether with hypoxia or hypercapnia: Secondary | ICD-10-CM | POA: Insufficient documentation

## 2017-02-02 DIAGNOSIS — J90 Pleural effusion, not elsewhere classified: Secondary | ICD-10-CM | POA: Insufficient documentation

## 2017-02-02 DIAGNOSIS — D696 Thrombocytopenia, unspecified: Secondary | ICD-10-CM | POA: Insufficient documentation

## 2017-02-02 DIAGNOSIS — Z95828 Presence of other vascular implants and grafts: Secondary | ICD-10-CM

## 2017-02-02 DIAGNOSIS — C3492 Malignant neoplasm of unspecified part of left bronchus or lung: Secondary | ICD-10-CM

## 2017-02-02 DIAGNOSIS — J9811 Atelectasis: Secondary | ICD-10-CM | POA: Insufficient documentation

## 2017-02-02 DIAGNOSIS — I700 Atherosclerosis of aorta: Secondary | ICD-10-CM | POA: Diagnosis not present

## 2017-02-02 DIAGNOSIS — C3431 Malignant neoplasm of lower lobe, right bronchus or lung: Secondary | ICD-10-CM

## 2017-02-02 LAB — CBC WITH DIFFERENTIAL (CANCER CENTER ONLY)
BASO#: 0 10*3/uL (ref 0.0–0.2)
BASO%: 0.3 % (ref 0.0–2.0)
EOS ABS: 0.1 10*3/uL (ref 0.0–0.5)
EOS%: 1 % (ref 0.0–7.0)
HEMATOCRIT: 46 % (ref 38.7–49.9)
HEMOGLOBIN: 15.2 g/dL (ref 13.0–17.1)
LYMPH#: 1.1 10*3/uL (ref 0.9–3.3)
LYMPH%: 15.2 % (ref 14.0–48.0)
MCH: 30.7 pg (ref 28.0–33.4)
MCHC: 33 g/dL (ref 32.0–35.9)
MCV: 93 fL (ref 82–98)
MONO#: 0.9 10*3/uL (ref 0.1–0.9)
MONO%: 12.9 % (ref 0.0–13.0)
NEUT#: 5.1 10*3/uL (ref 1.5–6.5)
NEUT%: 70.6 % (ref 40.0–80.0)
Platelets: 290 10*3/uL (ref 145–400)
RBC: 4.95 10*6/uL (ref 4.20–5.70)
RDW: 14.8 % (ref 11.1–15.7)
WBC: 7.2 10*3/uL (ref 4.0–10.0)

## 2017-02-02 LAB — CMP (CANCER CENTER ONLY)
ALBUMIN: 3.7 g/dL (ref 3.3–5.5)
ALT(SGPT): 28 U/L (ref 10–47)
AST: 29 U/L (ref 11–38)
Alkaline Phosphatase: 104 U/L — ABNORMAL HIGH (ref 26–84)
BILIRUBIN TOTAL: 1.2 mg/dL (ref 0.20–1.60)
BUN, Bld: 13 mg/dL (ref 7–22)
CO2: 29 meq/L (ref 18–33)
Calcium: 9.5 mg/dL (ref 8.0–10.3)
Chloride: 105 mEq/L (ref 98–108)
Creat: 1.1 mg/dl (ref 0.6–1.2)
Glucose, Bld: 105 mg/dL (ref 73–118)
Potassium: 3.8 mEq/L (ref 3.3–4.7)
Sodium: 144 mEq/L (ref 128–145)
TOTAL PROTEIN: 7.1 g/dL (ref 6.4–8.1)

## 2017-02-02 MED ORDER — HEPARIN SOD (PORK) LOCK FLUSH 100 UNIT/ML IV SOLN
500.0000 [IU] | Freq: Once | INTRAVENOUS | Status: AC
  Administered 2017-02-02: 500 [IU] via INTRAVENOUS
  Filled 2017-02-02: qty 5

## 2017-02-02 MED ORDER — SODIUM CHLORIDE 0.9% FLUSH
10.0000 mL | INTRAVENOUS | Status: DC | PRN
  Administered 2017-02-02: 10 mL via INTRAVENOUS
  Filled 2017-02-02: qty 10

## 2017-02-02 NOTE — Patient Instructions (Signed)
Implanted Port Home Guide An implanted port is a type of central line that is placed under the skin. Central lines are used to provide IV access when treatment or nutrition needs to be given through a person's veins. Implanted ports are used for long-term IV access. An implanted port may be placed because:  You need IV medicine that would be irritating to the small veins in your hands or arms.  You need long-term IV medicines, such as antibiotics.  You need IV nutrition for a long period.  You need frequent blood draws for lab tests.  You need dialysis.  Implanted ports are usually placed in the chest area, but they can also be placed in the upper arm, the abdomen, or the leg. An implanted port has two main parts:  Reservoir. The reservoir is round and will appear as a small, raised area under your skin. The reservoir is the part where a needle is inserted to give medicines or draw blood.  Catheter. The catheter is a thin, flexible tube that extends from the reservoir. The catheter is placed into a large vein. Medicine that is inserted into the reservoir goes into the catheter and then into the vein.  How will I care for my incision site? Do not get the incision site wet. Bathe or shower as directed by your health care provider. How is my port accessed? Special steps must be taken to access the port:  Before the port is accessed, a numbing cream can be placed on the skin. This helps numb the skin over the port site.  Your health care provider uses a sterile technique to access the port. ? Your health care provider must put on a mask and sterile gloves. ? The skin over your port is cleaned carefully with an antiseptic and allowed to dry. ? The port is gently pinched between sterile gloves, and a needle is inserted into the port.  Only "non-coring" port needles should be used to access the port. Once the port is accessed, a blood return should be checked. This helps ensure that the port  is in the vein and is not clogged.  If your port needs to remain accessed for a constant infusion, a clear (transparent) bandage will be placed over the needle site. The bandage and needle will need to be changed every week, or as directed by your health care provider.  Keep the bandage covering the needle clean and dry. Do not get it wet. Follow your health care provider's instructions on how to take a shower or bath while the port is accessed.  If your port does not need to stay accessed, no bandage is needed over the port.  What is flushing? Flushing helps keep the port from getting clogged. Follow your health care provider's instructions on how and when to flush the port. Ports are usually flushed with saline solution or a medicine called heparin. The need for flushing will depend on how the port is used.  If the port is used for intermittent medicines or blood draws, the port will need to be flushed: ? After medicines have been given. ? After blood has been drawn. ? As part of routine maintenance.  If a constant infusion is running, the port may not need to be flushed.  How long will my port stay implanted? The port can stay in for as long as your health care provider thinks it is needed. When it is time for the port to come out, surgery will be   done to remove it. The procedure is similar to the one performed when the port was put in. When should I seek immediate medical care? When you have an implanted port, you should seek immediate medical care if:  You notice a bad smell coming from the incision site.  You have swelling, redness, or drainage at the incision site.  You have more swelling or pain at the port site or the surrounding area.  You have a fever that is not controlled with medicine.  This information is not intended to replace advice given to you by your health care provider. Make sure you discuss any questions you have with your health care provider. Document  Released: 03/24/2005 Document Revised: 08/30/2015 Document Reviewed: 11/29/2012 Elsevier Interactive Patient Education  2017 [employer]  

## 2017-02-02 NOTE — Progress Notes (Signed)
Hematology and Oncology Follow Up Visit  [name] Fields [id number] [birth year] 82 y.o. 02/02/2017   Principle Diagnosis:  1. 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 is here today for follow-up. He is doing okay. He does feel some shortness of breath. He did have a chest x-ray today. This showed only chronic changes. He has a chronic left pleural effusion. He has some chronic pleural thickening on the left. There is chronic consolidation of the left base. Nothing appears to be new.  He is quite sad that his pulmonologist is leaving. It sounds like the pulmonologist is leaving because of [facility] policies. Somehow, this does not surprise me.  He has had no leg swelling. He has had no nausea or vomiting. He's had no bleeding. He's had no fever.  Overall, his performance status is ECOG 2.  Medications:  Allergies as of 02/02/2017      Reactions   Sertraline Hcl    Patient does not remember the reaction      Medication List       Accurate as of 02/02/17  9:49 AM. Always use your most recent med list.          albuterol (2.5 MG/3ML) 0.083% nebulizer solution Commonly known as:  PROVENTIL Take 3 mLs (2.5 mg total) by nebulization every 4 (four) hours as needed for wheezing.   albuterol 108 (90 Base) MCG/ACT inhaler Commonly known as:  PROVENTIL HFA;VENTOLIN HFA Inhale 2 puffs into the lungs every 6 (six) hours as needed for wheezing or shortness of breath.   aspirin EC 81 MG tablet Take 81 mg by mouth daily.   atorvastatin 20 MG tablet Commonly known as:  LIPITOR TAKE 1 TABLET BY MOUTH DAILY   budesonide 0.25 MG/2ML nebulizer solution Commonly known as:  PULMICORT Take 2 mLs (0.25 mg total) by nebulization every 12 (twelve) hours.   buPROPion 150 MG 12 hr  tablet Commonly known as:  WELLBUTRIN SR Take 150 mg by mouth daily.   dutasteride 0.5 MG capsule Commonly known as:  AVODART Take 0.5 mg by mouth daily.   formoterol 20 MCG/2ML nebulizer solution Commonly known as:  PERFOROMIST Take 2 mLs (20 mcg total) by nebulization every 12 (twelve) hours.   furosemide 20 MG tablet Commonly known as:  LASIX Take 1 tablet (20 mg total) by mouth daily.   guaiFENesin 600 MG 12 hr tablet Commonly known as:  MUCINEX Take 1 tablet (600 mg total) by mouth 2 (two) times daily.   ipratropium-albuterol 0.5-2.5 (3) MG/3ML Soln Commonly known as:  DUONEB Take 3 mLs by nebulization every 4 (four) hours as needed.   metoprolol tartrate 25 MG tablet Commonly known as:  LOPRESSOR TAKE 1/2 TABLET BY MOUTH TWICE DAILY   Omeprazole-Sodium Bicarbonate 20-1100 MG Caps capsule Commonly known as:  ZEGERID Take 1 capsule by mouth daily before breakfast.   tamsulosin 0.4 MG Caps capsule Commonly known as:  FLOMAX Take 0.4 mg by mouth daily with supper.   temazepam 30 MG capsule Commonly known as:  RESTORIL Take 30 mg by mouth at bedtime   venlafaxine XR 150 MG 24 hr capsule Commonly known as:  EFFEXOR-XR Take 150 mg by mouth daily.   Vitamin D3 2000 units Tabs Take 2,000 Units by mouth 2 (  two) times daily.       Allergies:  Allergies  Allergen Reactions  . Sertraline Hcl     Patient does not remember the reaction    Past Medical History, Surgical history, Social history, and Family History were reviewed and updated.  Review of Systems: As stated in the interim history  Physical Exam:  vitals were not taken for this visit.  Wt Readings from Last 3 Encounters:  02/02/17 191 lb 1.3 oz (86.7 kg)  01/13/17 189 lb 6 oz (85.9 kg)  12/16/16 198 lb 1.9 oz (89.9 kg)    Elderly appearing white male in no obvious distress. Vital signs show temperature of 98.1. Pulse 100. Blood pressure 122/77. Weight is 191 pounds. Head and neck exam shows no  ocular or oral lesions. There are no palpable cervical or supraclavicular lymph nodes. Lungs are clear on the right side. Left side is somewhat decreased. He has some wheezing bilaterally. Cardiac exam tachycardic but regular. He has no murmurs. Abdomen is soft. There is no palpable liver or spleen tip. Extremity shows no clubbing, cyanosis or edema. Neurological exam shows no focal neurological deficits.   Lab Results  Component Value Date   WBC 7.2 02/02/2017   HGB 15.2 02/02/2017   HCT 46.0 02/02/2017   MCV 93 02/02/2017   PLT 290 02/02/2017   Lab Results  Component Value Date   FERRITIN 92 12/20/2014   IRON 95 12/20/2014   TIBC 405 12/20/2014   UIBC 310 12/20/2014   IRONPCTSAT 23 12/20/2014   Lab Results  Component Value Date   RETICCTPCT 1.8 12/20/2014   RBC 4.95 02/02/2017   Lab Results  Component Value Date   KAPLAMBRATIO 1.98 (L) 12/20/2014   No results found for: Kandis Cocking, IGMSERUM Lab Results  Component Value Date   TOTALPROTELP 6.9 12/20/2014   ALBUMINELP 3.4 12/20/2014   A1GS 0.3 12/20/2014   A2GS 0.9 12/20/2014   BETS 1.3 12/20/2014   GAMS 1.1 12/20/2014   MSPIKE Not Observed 12/20/2014   SPEI Comment 12/20/2014     Chemistry      Component Value Date/Time   NA 140 12/01/2016 0957   K 4.5 12/01/2016 0957   CL 103 11/05/2016 0352   CL 104 12/28/2014 1349   CL 104 08/27/2012 1421   CO2 24 12/01/2016 0957   BUN 21.2 12/01/2016 0957   CREATININE 1.1 12/01/2016 0957      Component Value Date/Time   CALCIUM 10.2 12/01/2016 0957   ALKPHOS 111 12/01/2016 0957   AST 30 12/01/2016 0957   ALT 24 12/01/2016 0957   BILITOT 0.92 12/01/2016 0957      Impression and Plan: Mr. Beaumier is a very pleasant 82 yo caucasian gentleman with history of both right and left lung cancers as well as melanoma. His most recent malignancy was stage I lung cancer diagnosed in April 2013. He had stereotactic radiosurgery that same year in July.   From my point of view,  everything looks pretty stable. I don't think that recurrent malignancy is a problem for him.  Hopefully, he'll be able to be transferred to another good pulmonologist.  We will plan to see him back in about 4 months. I don't think we need any chest x-rays we see him back.Volanda Napoleon, MD 10/29/20189:49 AM

## 2017-02-16 DIAGNOSIS — J3489 Other specified disorders of nose and nasal sinuses: Secondary | ICD-10-CM | POA: Diagnosis not present

## 2017-02-16 DIAGNOSIS — J441 Chronic obstructive pulmonary disease with (acute) exacerbation: Secondary | ICD-10-CM | POA: Diagnosis not present

## 2017-03-23 ENCOUNTER — Other Ambulatory Visit: Payer: Self-pay | Admitting: Internal Medicine

## 2017-03-23 ENCOUNTER — Ambulatory Visit
Admission: RE | Admit: 2017-03-23 | Discharge: 2017-03-23 | Disposition: A | Discharge status: Home / Self Care | Payer: Medicare Other | Source: Ambulatory Visit | Attending: Internal Medicine | Admitting: Internal Medicine

## 2017-03-23 DIAGNOSIS — J441 Chronic obstructive pulmonary disease with (acute) exacerbation: Secondary | ICD-10-CM

## 2017-04-03 DIAGNOSIS — I10 Essential (primary) hypertension: Secondary | ICD-10-CM | POA: Diagnosis not present

## 2017-04-03 DIAGNOSIS — C3430 Malignant neoplasm of lower lobe, unspecified bronchus or lung: Secondary | ICD-10-CM | POA: Diagnosis not present

## 2017-04-03 DIAGNOSIS — I252 Old myocardial infarction: Secondary | ICD-10-CM | POA: Diagnosis not present

## 2017-04-03 DIAGNOSIS — N401 Enlarged prostate with lower urinary tract symptoms: Secondary | ICD-10-CM | POA: Diagnosis not present

## 2017-04-03 DIAGNOSIS — F329 Major depressive disorder, single episode, unspecified: Secondary | ICD-10-CM | POA: Diagnosis not present

## 2017-04-03 DIAGNOSIS — Z85118 Personal history of other malignant neoplasm of bronchus and lung: Secondary | ICD-10-CM | POA: Diagnosis not present

## 2017-04-03 DIAGNOSIS — I2510 Atherosclerotic heart disease of native coronary artery without angina pectoris: Secondary | ICD-10-CM | POA: Diagnosis not present

## 2017-04-03 DIAGNOSIS — F322 Major depressive disorder, single episode, severe without psychotic features: Secondary | ICD-10-CM | POA: Diagnosis not present

## 2017-04-03 DIAGNOSIS — J441 Chronic obstructive pulmonary disease with (acute) exacerbation: Secondary | ICD-10-CM | POA: Diagnosis not present

## 2017-04-03 DIAGNOSIS — N400 Enlarged prostate without lower urinary tract symptoms: Secondary | ICD-10-CM | POA: Diagnosis not present

## 2017-04-03 DIAGNOSIS — J449 Chronic obstructive pulmonary disease, unspecified: Secondary | ICD-10-CM | POA: Diagnosis not present

## 2017-04-03 DIAGNOSIS — E782 Mixed hyperlipidemia: Secondary | ICD-10-CM | POA: Diagnosis not present

## 2017-04-20 DIAGNOSIS — Z1389 Encounter for screening for other disorder: Secondary | ICD-10-CM | POA: Diagnosis not present

## 2017-04-20 DIAGNOSIS — M179 Osteoarthritis of knee, unspecified: Secondary | ICD-10-CM | POA: Diagnosis not present

## 2017-04-20 DIAGNOSIS — N401 Enlarged prostate with lower urinary tract symptoms: Secondary | ICD-10-CM | POA: Diagnosis not present

## 2017-04-20 DIAGNOSIS — I10 Essential (primary) hypertension: Secondary | ICD-10-CM | POA: Diagnosis not present

## 2017-04-20 DIAGNOSIS — Z0000 Encounter for general adult medical examination without abnormal findings: Secondary | ICD-10-CM | POA: Diagnosis not present

## 2017-04-20 DIAGNOSIS — E782 Mixed hyperlipidemia: Secondary | ICD-10-CM | POA: Diagnosis not present

## 2017-04-20 DIAGNOSIS — I5030 Unspecified diastolic (congestive) heart failure: Secondary | ICD-10-CM | POA: Diagnosis not present

## 2017-04-20 DIAGNOSIS — F322 Major depressive disorder, single episode, severe without psychotic features: Secondary | ICD-10-CM | POA: Diagnosis not present

## 2017-04-20 DIAGNOSIS — J441 Chronic obstructive pulmonary disease with (acute) exacerbation: Secondary | ICD-10-CM | POA: Diagnosis not present

## 2017-04-20 DIAGNOSIS — I2510 Atherosclerotic heart disease of native coronary artery without angina pectoris: Secondary | ICD-10-CM | POA: Diagnosis not present

## 2017-04-20 DIAGNOSIS — R05 Cough: Secondary | ICD-10-CM | POA: Diagnosis not present

## 2017-04-20 DIAGNOSIS — Z79899 Other long term (current) drug therapy: Secondary | ICD-10-CM | POA: Diagnosis not present

## 2017-04-27 ENCOUNTER — Other Ambulatory Visit: Payer: Self-pay

## 2017-04-27 ENCOUNTER — Inpatient Hospital Stay: Payer: Medicare Other | Attending: Hematology & Oncology

## 2017-04-27 VITALS — BP 124/71 | HR 66 | Temp 97.5°F | Resp 20

## 2017-04-27 DIAGNOSIS — Z85118 Personal history of other malignant neoplasm of bronchus and lung: Secondary | ICD-10-CM | POA: Insufficient documentation

## 2017-04-27 DIAGNOSIS — Z9221 Personal history of antineoplastic chemotherapy: Secondary | ICD-10-CM | POA: Diagnosis not present

## 2017-04-27 DIAGNOSIS — Z95828 Presence of other vascular implants and grafts: Secondary | ICD-10-CM

## 2017-04-27 DIAGNOSIS — Z452 Encounter for adjustment and management of vascular access device: Secondary | ICD-10-CM | POA: Insufficient documentation

## 2017-04-27 MED ORDER — HEPARIN SOD (PORK) LOCK FLUSH 100 UNIT/ML IV SOLN
500.0000 [IU] | Freq: Once | INTRAVENOUS | Status: AC
  Administered 2017-04-27: 500 [IU] via INTRAVENOUS
  Filled 2017-04-27: qty 5

## 2017-04-27 MED ORDER — SODIUM CHLORIDE 0.9% FLUSH
10.0000 mL | INTRAVENOUS | Status: DC | PRN
  Administered 2017-04-27: 10 mL via INTRAVENOUS
  Filled 2017-04-27: qty 10

## 2017-05-12 ENCOUNTER — Encounter: Payer: Self-pay | Admitting: Emergency Medicine

## 2017-05-12 ENCOUNTER — Ambulatory Visit (INDEPENDENT_AMBULATORY_CARE_PROVIDER_SITE_OTHER): Payer: Medicare Other | Admitting: Emergency Medicine

## 2017-05-12 DIAGNOSIS — J438 Other emphysema: Secondary | ICD-10-CM | POA: Diagnosis not present

## 2017-05-12 NOTE — Patient Instructions (Signed)
Please stop Symbicort We will continue to use DuoNeb (albuterol/ipratropium) 4 times a day on a schedule. You can use albuterol nebulized up to every 4 hours if you needed for shortness of breath, wheezing, chest tightness. Your immunizations are up-to-date. Please follow with Dr. Marin Olp per your usual schedule. Follow with Dr Lamonte Sakai in 4 months or sooner if you have any problems.

## 2017-05-12 NOTE — Assessment & Plan Note (Addendum)
Is not entirely clear on his medications.  He has Symbicort on his list which states twice daily, but he believes he is taking it every 4 hours on a schedule.  He also states that he is taking DuoNeb on a schedule.  He did not desaturate on 6-minute walk in October.  No current evidence or recent evidence of an exacerbation. We will stop the Symbicort as it is redundant.  Clarify that he is taking DuoNeb 4 times a day on a schedule.  Make sure he has albuterol to use as needed.  Goal had been to get him back on Perforomist plus budesonide nebs, this appears to have been abandoned due to cost.  Please stop Symbicort We will continue to use DuoNeb (albuterol/ipratropium) 4 times a day on a schedule. You can use albuterol nebulized up to every 4 hours if you needed for shortness of breath, wheezing, chest tightness. Your immunizations are up-to-date. Please follow with Dr. Marin Olp per your usual schedule. Follow with Dr Lamonte Sakai in 4 months or sooner if you have any problems.

## 2017-05-12 NOTE — Progress Notes (Signed)
Subjective:    Patient ID: [name] Fields, male    DOB: 02-12-1935, 83 y.o.   MRN: [id number]  HPI 83 year old gentleman previously followed here by Dr. Ashok Cordia.  He has a significant tobacco history (120 pack years) with associated COPD.  A-1 AT MS genotype. He also has a history of non-small cell lung cancer, separate primaries one in the right lower lobe and one in the left lung, treated with chemoradiation.  Most recently treated for a stage I in 10/2011.  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  He has had both Pneumovax and Prevnar. He complains of exertion SOB, can happen with going from sitting to standing. He doesn't walk very much now - decreased about 2 years ago. Last 6 minutes walk was 01/2017 > no desaturation. He wheezes with exertion and most mornings when he gets up. He is using DuoNeb qid on a schedule.  He says he is using Symbicort q4h    Review of Systems As per HPI  Past Medical History:  Diagnosis Date  . Anxiety    takes wellbutrin daily  . Arthritis    knees  . COPD (chronic obstructive pulmonary disease) ([place])   . Diverticulosis   . Emphysema of lung ([place])   . GERD (gastroesophageal reflux disease)   . Hearing loss   . Hx of radiation therapy 09/29/11;10/01/11;10/03/11;10/06/11;10/08/11;   RLLlung,50Gy/8fx - SBRT  . Hyperlipidemia   . Hypertension   . Hypokinesia    MILD INFERIOR  . IHD (ischemic heart disease)    Remote MI in 2001 with stent to the LCX  . Lactose intolerance   . Non-small cell carcinoma of lung ([place])    Dx in October of 2010  . Normal nuclear stress test 2009   EF 63%. No ischemia. 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  . Pericardial effusion July 2011   s/p pericardial window; last echo in August 2012 with minimal effusion  . Pleural effusion, left   . SOB (shortness of breath)    Chronic with known fibrosis  . Status post chemotherapy 06/04/2009 -  09/06/2009    4 cycles of Cisplatin and Gemcitabine with Neulasta Support     . Vertigo    hx. of- ambulates with cane for safety.     Family History  Problem Relation Age of Onset  . Lung cancer Father   . Stroke Sister   . Heart disease Neg Hx   . Anesthesia problems Neg Hx   . Lung disease Neg Hx      Social History   Socioeconomic History  . Marital status: Single    Spouse name: Not on file  . Number of children: Not on file  . Years of education: Not on file  . Highest education level: Not on file  Social Needs  . Financial resource strain: Not on file  . Food insecurity - worry: Not on file  . Food insecurity - inability: Not on file  . Transportation needs - medical: Not on file  . Transportation needs - non-medical: Not on file  Occupational History  . Occupation: retired    Fish farm manager: RETIRED    Comment: [employer]  Tobacco Use  . Smoking status: Former Smoker    Packs/day: 3.00    Years: 40.00    Pack years: 120.00    Types: Cigarettes    Last attempt to quit: 04/07/1994    Years since quitting: 23.1  . Smokeless tobacco: Never Used  Substance and  Sexual Activity  . Alcohol use: No    Alcohol/week: 0.0 oz  . Drug use: No  . Sexual activity: No  Other Topics Concern  . Not on file  Social History Narrative   Originally from [place]. Previously worked for [employer]. Currently has 2 dogs. No bird or mold exposure.      Allergies  Allergen Reactions  . Sertraline Hcl     Patient does not remember the reaction     Outpatient Medications Prior to Visit  Medication Sig Dispense Refill  . albuterol (PROVENTIL HFA;VENTOLIN HFA) 108 (90 Base) MCG/ACT inhaler Inhale 2 puffs into the lungs every 6 (six) hours as needed for wheezing or shortness of breath. 1 Inhaler 2  . albuterol (PROVENTIL) (2.5 MG/3ML) 0.083% nebulizer solution Take 3 mLs (2.5 mg total) by nebulization every 4 (four) hours as needed for wheezing. 75 mL 12  . aspirin EC 81 MG tablet Take 81 mg by mouth daily.    Marland Kitchen atorvastatin (LIPITOR) 20 MG tablet TAKE 1 TABLET BY MOUTH DAILY  (Patient taking differently: Take 20 mg by mouth once a day) 90 tablet 1  . budesonide-formoterol (SYMBICORT) 160-4.5 MCG/ACT inhaler Inhale 2 puffs into the lungs 2 (two) times daily.    Marland Kitchen buPROPion (WELLBUTRIN SR) 150 MG 12 hr tablet Take 150 mg by mouth daily.     . Cholecalciferol (VITAMIN D3) 2000 units TABS Take 2,000 Units by mouth 2 (two) times daily.     . furosemide (LASIX) 20 MG tablet Take 1 tablet (20 mg total) by mouth daily. 30 tablet 0  . guaiFENesin (MUCINEX) 600 MG 12 hr tablet Take 1 tablet (600 mg total) by mouth 2 (two) times daily. 20 tablet 0  . ipratropium-albuterol (DUONEB) 0.5-2.5 (3) MG/3ML SOLN Take 3 mLs by nebulization every 6 (six) hours as needed.    . metoprolol tartrate (LOPRESSOR) 25 MG tablet TAKE 1/2 TABLET BY MOUTH TWICE DAILY 90 tablet 1  . Tamsulosin HCl (FLOMAX) 0.4 MG CAPS Take 0.4 mg by mouth daily with supper.     . temazepam (RESTORIL) 30 MG capsule Take 30 mg by mouth at bedtime    . venlafaxine XR (EFFEXOR-XR) 150 MG 24 hr capsule Take 150 mg by mouth daily.   1  . budesonide (PULMICORT) 0.25 MG/2ML nebulizer solution Take 2 mLs (0.25 mg total) by nebulization every 12 (twelve) hours. 60 mL 3  . dutasteride (AVODART) 0.5 MG capsule Take 0.5 mg by mouth daily.     . formoterol (PERFOROMIST) 20 MCG/2ML nebulizer solution Take 2 mLs (20 mcg total) by nebulization every 12 (twelve) hours. 60 mL 0  . ipratropium-albuterol (DUONEB) 0.5-2.5 (3) MG/3ML SOLN Take 3 mLs by nebulization every 4 (four) hours as needed. 120 mL 3  . Omeprazole-Sodium Bicarbonate (ZEGERID) 20-1100 MG CAPS capsule Take 1 capsule by mouth daily before breakfast.     No facility-administered medications prior to visit.          Objective:   Physical Exam  Vitals:   05/12/17 1031 05/12/17 1032  BP:  126/74  Pulse:  80  SpO2:  98%  Weight: 196 lb (88.9 kg)   Height: 5\' 10"  (1.778 m)    Gen: Pleasant, elderly gentleman, in no distress,  normal affect  ENT: No lesions,   mouth clear,  oropharynx clear, no postnasal drip, hearing aids in place  Neck: No JVD, some high-pitched upper airway noise with expiration  Lungs: No use of accessory muscles, no wheezing, no crackles, some  referred upper airway noise  Cardiovascular: RRR, heart sounds normal, no murmur or gallops, no peripheral edema  Musculoskeletal: No deformities, no cyanosis or clubbing  Neuro: alert, non focal  Skin: Warm, no lesions or rash     Assessment & Plan:  COPD (chronic obstructive pulmonary disease) with emphysema ([facility]) Is not entirely clear on his medications.  He has Symbicort on his list which states twice daily, but he believes he is taking it every 4 hours on a schedule.  He also states that he is taking DuoNeb on a schedule.  He did not desaturate on 6-minute walk in October.  No current evidence or recent evidence of an exacerbation. We will stop the Symbicort as it is redundant.  Clarify that he is taking DuoNeb 4 times a day on a schedule.  Make sure he has albuterol to use as needed.  Goal had been to get him back on Perforomist plus budesonide nebs, this appears to have been abandoned due to cost.  Please stop Symbicort We will continue to use DuoNeb (albuterol/ipratropium) 4 times a day on a schedule. You can use albuterol nebulized up to every 4 hours if you needed for shortness of breath, wheezing, chest tightness. Your immunizations are up-to-date. Please follow with Dr. Marin Olp per your usual schedule. Follow with Dr Lamonte Sakai in 4 months or sooner if you have any problems.  Baltazar Apo, MD, PhD 05/12/2017, 11:06 AM [facility] [phone] or if no answer [phone]

## 2017-06-08 ENCOUNTER — Inpatient Hospital Stay: Payer: Medicare Other

## 2017-06-08 ENCOUNTER — Inpatient Hospital Stay: Payer: Medicare Other | Attending: Hematology & Oncology | Admitting: Hematology & Oncology

## 2017-06-08 DIAGNOSIS — Z85820 Personal history of malignant melanoma of skin: Secondary | ICD-10-CM | POA: Insufficient documentation

## 2017-06-08 DIAGNOSIS — R0609 Other forms of dyspnea: Secondary | ICD-10-CM | POA: Insufficient documentation

## 2017-06-08 DIAGNOSIS — D693 Immune thrombocytopenic purpura: Secondary | ICD-10-CM | POA: Insufficient documentation

## 2017-06-08 DIAGNOSIS — Z85118 Personal history of other malignant neoplasm of bronchus and lung: Secondary | ICD-10-CM | POA: Insufficient documentation

## 2017-06-08 DIAGNOSIS — Z7982 Long term (current) use of aspirin: Secondary | ICD-10-CM | POA: Insufficient documentation

## 2017-06-08 DIAGNOSIS — Z79899 Other long term (current) drug therapy: Secondary | ICD-10-CM | POA: Insufficient documentation

## 2017-06-15 ENCOUNTER — Ambulatory Visit: Payer: Medicare Other | Admitting: Nurse Practitioner

## 2017-06-18 ENCOUNTER — Other Ambulatory Visit: Payer: Self-pay

## 2017-06-18 ENCOUNTER — Inpatient Hospital Stay: Payer: Medicare Other

## 2017-06-18 ENCOUNTER — Inpatient Hospital Stay (HOSPITAL_BASED_OUTPATIENT_CLINIC_OR_DEPARTMENT_OTHER): Payer: Medicare Other | Admitting: Family

## 2017-06-18 VITALS — BP 131/77 | HR 67 | Temp 97.5°F | Resp 20

## 2017-06-18 DIAGNOSIS — D693 Immune thrombocytopenic purpura: Secondary | ICD-10-CM

## 2017-06-18 DIAGNOSIS — Z85118 Personal history of other malignant neoplasm of bronchus and lung: Secondary | ICD-10-CM | POA: Diagnosis not present

## 2017-06-18 DIAGNOSIS — R0609 Other forms of dyspnea: Secondary | ICD-10-CM

## 2017-06-18 DIAGNOSIS — C3492 Malignant neoplasm of unspecified part of left bronchus or lung: Secondary | ICD-10-CM

## 2017-06-18 DIAGNOSIS — Z85820 Personal history of malignant melanoma of skin: Secondary | ICD-10-CM | POA: Diagnosis not present

## 2017-06-18 DIAGNOSIS — Z79899 Other long term (current) drug therapy: Secondary | ICD-10-CM

## 2017-06-18 DIAGNOSIS — C3431 Malignant neoplasm of lower lobe, right bronchus or lung: Secondary | ICD-10-CM

## 2017-06-18 DIAGNOSIS — Z7982 Long term (current) use of aspirin: Secondary | ICD-10-CM

## 2017-06-18 DIAGNOSIS — Z95828 Presence of other vascular implants and grafts: Secondary | ICD-10-CM

## 2017-06-18 DIAGNOSIS — D0330 Melanoma in situ of unspecified part of face: Secondary | ICD-10-CM

## 2017-06-18 LAB — CBC WITH DIFFERENTIAL (CANCER CENTER ONLY)
BASOS PCT: 1 %
Basophils Absolute: 0 10*3/uL (ref 0.0–0.1)
Eosinophils Absolute: 0.2 10*3/uL (ref 0.0–0.5)
Eosinophils Relative: 3 %
HEMATOCRIT: 43.2 % (ref 38.7–49.9)
Hemoglobin: 13.9 g/dL (ref 13.0–17.1)
Lymphocytes Relative: 14 %
Lymphs Abs: 1 10*3/uL (ref 0.9–3.3)
MCH: 30.2 pg (ref 28.0–33.4)
MCHC: 32.2 g/dL (ref 32.0–35.9)
MCV: 93.7 fL (ref 82.0–98.0)
MONOS PCT: 10 %
Monocytes Absolute: 0.8 10*3/uL (ref 0.1–0.9)
NEUTROS ABS: 5.3 10*3/uL (ref 1.5–6.5)
Neutrophils Relative %: 72 %
PLATELETS: 241 10*3/uL (ref 145–400)
RBC: 4.61 MIL/uL (ref 4.20–5.70)
RDW: 13.5 % (ref 11.1–15.7)
WBC Count: 7.3 10*3/uL (ref 4.0–10.0)

## 2017-06-18 LAB — CMP (CANCER CENTER ONLY)
ALBUMIN: 3.6 g/dL (ref 3.5–5.0)
ALK PHOS: 97 U/L (ref 40–150)
ALT: 13 U/L (ref 0–55)
ANION GAP: 7 (ref 3–11)
AST: 17 U/L (ref 5–34)
BILIRUBIN TOTAL: 0.8 mg/dL (ref 0.2–1.2)
BUN: 15 mg/dL (ref 7–26)
CALCIUM: 9.3 mg/dL (ref 8.4–10.4)
CO2: 27 mmol/L (ref 22–29)
CREATININE: 0.82 mg/dL (ref 0.70–1.30)
Chloride: 107 mmol/L (ref 98–109)
GFR, Est AFR Am: >60 mL/min (ref >60)
GFR, Estimated: >60 mL/min (ref >60)
Glucose, Bld: 95 mg/dL (ref 70–140)
Potassium: 4.1 mmol/L (ref 3.5–5.1)
SODIUM: 141 mmol/L (ref 136–145)
TOTAL PROTEIN: 7 g/dL (ref 6.4–8.3)

## 2017-06-18 MED ORDER — HEPARIN SOD (PORK) LOCK FLUSH 100 UNIT/ML IV SOLN
500.0000 [IU] | Freq: Once | INTRAVENOUS | Status: AC
  Administered 2017-06-18: 500 [IU] via INTRAVENOUS
  Filled 2017-06-18: qty 5

## 2017-06-18 MED ORDER — SODIUM CHLORIDE 0.9% FLUSH
10.0000 mL | INTRAVENOUS | Status: DC | PRN
  Administered 2017-06-18: 10 mL via INTRAVENOUS
  Filled 2017-06-18: qty 10

## 2017-06-18 NOTE — Patient Instructions (Signed)
Implanted Port Home Guide An implanted port is a type of central line that is placed under the skin. Central lines are used to provide IV access when treatment or nutrition needs to be given through a person's veins. Implanted ports are used for long-term IV access. An implanted port may be placed because:  You need IV medicine that would be irritating to the small veins in your hands or arms.  You need long-term IV medicines, such as antibiotics.  You need IV nutrition for a long period.  You need frequent blood draws for lab tests.  You need dialysis.  Implanted ports are usually placed in the chest area, but they can also be placed in the upper arm, the abdomen, or the leg. An implanted port has two main parts:  Reservoir. The reservoir is round and will appear as a small, raised area under your skin. The reservoir is the part where a needle is inserted to give medicines or draw blood.  Catheter. The catheter is a thin, flexible tube that extends from the reservoir. The catheter is placed into a large vein. Medicine that is inserted into the reservoir goes into the catheter and then into the vein.  How will I care for my incision site? Do not get the incision site wet. Bathe or shower as directed by your health care provider. How is my port accessed? Special steps must be taken to access the port:  Before the port is accessed, a numbing cream can be placed on the skin. This helps numb the skin over the port site.  Your health care provider uses a sterile technique to access the port. ? Your health care provider must put on a mask and sterile gloves. ? The skin over your port is cleaned carefully with an antiseptic and allowed to dry. ? The port is gently pinched between sterile gloves, and a needle is inserted into the port.  Only "non-coring" port needles should be used to access the port. Once the port is accessed, a blood return should be checked. This helps ensure that the port  is in the vein and is not clogged.  If your port needs to remain accessed for a constant infusion, a clear (transparent) bandage will be placed over the needle site. The bandage and needle will need to be changed every week, or as directed by your health care provider.  Keep the bandage covering the needle clean and dry. Do not get it wet. Follow your health care provider's instructions on how to take a shower or bath while the port is accessed.  If your port does not need to stay accessed, no bandage is needed over the port.  What is flushing? Flushing helps keep the port from getting clogged. Follow your health care provider's instructions on how and when to flush the port. Ports are usually flushed with saline solution or a medicine called heparin. The need for flushing will depend on how the port is used.  If the port is used for intermittent medicines or blood draws, the port will need to be flushed: ? After medicines have been given. ? After blood has been drawn. ? As part of routine maintenance.  If a constant infusion is running, the port may not need to be flushed.  How long will my port stay implanted? The port can stay in for as long as your health care provider thinks it is needed. When it is time for the port to come out, surgery will be   done to remove it. The procedure is similar to the one performed when the port was put in. When should I seek immediate medical care? When you have an implanted port, you should seek immediate medical care if:  You notice a bad smell coming from the incision site.  You have swelling, redness, or drainage at the incision site.  You have more swelling or pain at the port site or the surrounding area.  You have a fever that is not controlled with medicine.  This information is not intended to replace advice given to you by your health care provider. Make sure you discuss any questions you have with your health care provider. Document  Released: 03/24/2005 Document Revised: 08/30/2015 Document Reviewed: 11/29/2012 Elsevier Interactive Patient Education  2017 [employer]  

## 2017-06-18 NOTE — Progress Notes (Signed)
Hematology and Oncology Follow Up Visit  MAKAYLA CONFER [id number] May 02, 1934 83 y.o. 06/18/2017   Principle Diagnosis:  1. 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 ITP   Current Therapy:   Observation   Interim History:  Mr. Wichmann is here today for follow-up. She is doing well. His SOB with exertion is unchanged. Since having the stereotactic radiosurgery on his right lung he has to get up and down slowly to avoid lightheadedness.  No fever, chills, n/v, cough, rash, chest pain, palpitations, abdominal pain or changes in bowel or bladder habits.  No swelling, tenderness, numbness or tingling in his extremities. No c.o pain.  He uses his cane when ambulating for support.  No lymphadenopathy noted on exam.  No episodes of bleeding. No bruising or petechiae.  He has maintained a good appetite and is staying well hydrated. His weight is stable.  He likes to stay active and spends a lot of time with his dogs.   ECOG Performance Status: 1 - Symptomatic but completely ambulatory  Medications:  Allergies as of 06/18/2017      Reactions   Sertraline Hcl    Patient does not remember the reaction      Medication List        Accurate as of 06/18/17 10:20 AM. Always use your most recent med list.          albuterol (2.5 MG/3ML) 0.083% nebulizer solution Commonly known as:  PROVENTIL Take 3 mLs (2.5 mg total) by nebulization every 4 (four) hours as needed for wheezing.   albuterol 108 (90 Base) MCG/ACT inhaler Commonly known as:  PROVENTIL HFA;VENTOLIN HFA Inhale 2 puffs into the lungs every 6 (six) hours as needed for wheezing or shortness of breath.   aspirin EC 81 MG tablet Take 81 mg by mouth daily.   atorvastatin 20 MG tablet Commonly known as:  LIPITOR TAKE 1 TABLET BY MOUTH DAILY   budesonide-formoterol 160-4.5 MCG/ACT  inhaler Commonly known as:  SYMBICORT Inhale 2 puffs into the lungs 2 (two) times daily.   buPROPion 150 MG 12 hr tablet Commonly known as:  WELLBUTRIN SR Take 150 mg by mouth daily.   furosemide 20 MG tablet Commonly known as:  LASIX Take 1 tablet (20 mg total) by mouth daily.   guaiFENesin 600 MG 12 hr tablet Commonly known as:  MUCINEX Take 1 tablet (600 mg total) by mouth 2 (two) times daily.   ipratropium-albuterol 0.5-2.5 (3) MG/3ML Soln Commonly known as:  DUONEB Take 3 mLs by nebulization every 6 (six) hours as needed.   metoprolol tartrate 25 MG tablet Commonly known as:  LOPRESSOR TAKE 1/2 TABLET BY MOUTH TWICE DAILY   tamsulosin 0.4 MG Caps capsule Commonly known as:  FLOMAX Take 0.4 mg by mouth daily with supper.   temazepam 30 MG capsule Commonly known as:  RESTORIL Take 30 mg by mouth at bedtime   venlafaxine XR 150 MG 24 hr capsule Commonly known as:  EFFEXOR-XR Take 150 mg by mouth daily.   Vitamin D3 2000 units Tabs Take 2,000 Units by mouth 2 (two) times daily.       Allergies:  Allergies  Allergen Reactions  . Sertraline Hcl     Patient does not remember the reaction    Past Medical History, Surgical history, Social history, and Family History were reviewed and updated.  Review of Systems: All other 10 point review of systems is negative.   Physical Exam:  vitals were not taken for this visit.   Wt Readings from Last 3 Encounters:  05/12/17 196 lb (88.9 kg)  02/02/17 191 lb 1.3 oz (86.7 kg)  01/13/17 189 lb 6 oz (85.9 kg)    Ocular: Sclerae unicteric, pupils equal, round and reactive to light Ear-nose-throat: Oropharynx clear, dentition fair Lymphatic: No cervical, supraclavicular or axillary adenopathy Lungs no rales or rhonchi, good excursion bilaterally Heart regular rate and rhythm, no murmur appreciated Abd soft, nontender, positive bowel sounds, no liver or spleen tip palpated on exam, no fluid wave  MSK no focal spinal  tenderness, no joint edema Neuro: non-focal, well-oriented, appropriate affect Breasts: Deferred   Lab Results  Component Value Date   WBC 7.3 06/18/2017   HGB 15.2 02/02/2017   HCT 43.2 06/18/2017   MCV 93.7 06/18/2017   PLT 241 06/18/2017   Lab Results  Component Value Date   FERRITIN 92 12/20/2014   IRON 95 12/20/2014   TIBC 405 12/20/2014   UIBC 310 12/20/2014   IRONPCTSAT 23 12/20/2014   Lab Results  Component Value Date   RETICCTPCT 1.8 12/20/2014   RBC 4.61 06/18/2017   Lab Results  Component Value Date   KAPLAMBRATIO 1.98 (L) 12/20/2014   No results found for: Kandis Cocking, IGMSERUM Lab Results  Component Value Date   TOTALPROTELP 6.9 12/20/2014   ALBUMINELP 3.4 12/20/2014   A1GS 0.3 12/20/2014   A2GS 0.9 12/20/2014   BETS 1.3 12/20/2014   GAMS 1.1 12/20/2014   MSPIKE Not Observed 12/20/2014   SPEI Comment 12/20/2014     Chemistry      Component Value Date/Time   NA 144 02/02/2017 0842   NA 140 12/01/2016 0957   K 3.8 02/02/2017 0842   K 4.5 12/01/2016 0957   CL 105 02/02/2017 0842   CL 104 08/27/2012 1421   CO2 29 02/02/2017 0842   CO2 24 12/01/2016 0957   BUN 13 02/02/2017 0842   BUN 21.2 12/01/2016 0957   CREATININE 1.1 02/02/2017 0842   CREATININE 1.1 12/01/2016 0957      Component Value Date/Time   CALCIUM 9.5 02/02/2017 0842   CALCIUM 10.2 12/01/2016 0957   ALKPHOS 104 (H) 02/02/2017 0842   ALKPHOS 111 12/01/2016 0957   AST 29 02/02/2017 0842   AST 30 12/01/2016 0957   ALT 28 02/02/2017 0842   ALT 24 12/01/2016 0957   BILITOT 1.20 02/02/2017 0842   BILITOT 0.92 12/01/2016 0957      Impression and Plan: Mr. Voller is a very pleasant 83 yo caucasian gentleman with history of both right and left lung cancers as well as melanoma of the left cheek. His most recent malignancy was the right lung cancer treated with stereotactic radiosurgery in July 2013. So far, he continues to do well and there has been no evidence of recurrence.  We  will continue to follow along with him and plan to see him back in another 6 months.  He will contact our office with any questions or concerns. We can certainly see him sooner if need be.   Laverna Peace, NP 3/14/201910:20 AM

## 2017-07-07 ENCOUNTER — Encounter: Payer: Self-pay | Admitting: Nurse Practitioner

## 2017-07-07 ENCOUNTER — Ambulatory Visit (INDEPENDENT_AMBULATORY_CARE_PROVIDER_SITE_OTHER): Payer: Medicare Other | Admitting: Nurse Practitioner

## 2017-07-07 VITALS — BP 110/68 | HR 98 | Ht 70.0 in | Wt 198.8 lb

## 2017-07-07 DIAGNOSIS — I259 Chronic ischemic heart disease, unspecified: Secondary | ICD-10-CM

## 2017-07-07 DIAGNOSIS — R0602 Shortness of breath: Secondary | ICD-10-CM

## 2017-07-07 DIAGNOSIS — M6530 Trigger finger, unspecified finger: Secondary | ICD-10-CM | POA: Diagnosis not present

## 2017-07-07 DIAGNOSIS — J438 Other emphysema: Secondary | ICD-10-CM

## 2017-07-07 NOTE — Progress Notes (Signed)
CARDIOLOGY OFFICE NOTE  Date:  07/07/2017    [name] Fields Date of Birth: 1934-11-25 Medical Record #[id number]  PCP:  [name], MD  Cardiologist:  [name]    Chief Complaint  Patient presents with  . Coronary Artery Disease    7 month check. Seen for Dr. Marlou Porch    History of Present Illness: [name] Fields is a 83 y.o. male who presents today for a 7 month check. Seen for Dr. Marlou Porch. He is a former patient of Dr. Susa Simmonds that I cared for for many years.   He has a history of coronary artery disease with prior lateral wall MI status post multiple stents in the past. Other issues include lung cancer in both sides (treated with radiation), melanoma of the left cheeck, fibrosis, recurrent pleural effusion. He has had a pericardial window for an effusion back in 2011.   I saw him back in July of 2017- he had been referred back for shortness of breath. Productive cough - COPD - I sent him back to pulmonary for evaluation/treatment - seemed more pulmonary related/radiation effects. Did not understand how to use his Spiriva. Some swelling - getting too much salt. I added some low dose Lasix. He was better on return but has remained very limited by his breathing.  Last seen in September.   Comes in today. Here alone. Now using a cane due to falls - he says this is from his vertigo. Has probably had 10 to 12 falls since I last saw him - fortunately he has not gotten hurt. No chest pain. Does have considerable shortness of breath - sometimes worse when he lies down. He is not active. Now seeing Dr. Lamonte Sakai for his emphysema. Continues with oncology follow up as well. Planning on going to the walk in clinic later today for ?trigger finger. Tolerating his medicines. Labs from earlier this year noted on the [facility].   Past Medical History:  Diagnosis Date  . Anxiety    takes wellbutrin daily  . Arthritis    knees  . COPD (chronic obstructive pulmonary disease) ([place])    . Diverticulosis   . Emphysema of lung ([place])   . GERD (gastroesophageal reflux disease)   . Hearing loss   . Hx of radiation therapy 09/29/11;10/01/11;10/03/11;10/06/11;10/08/11;   RLLlung,50Gy/67fx - SBRT  . Hyperlipidemia   . Hypertension   . Hypokinesia    MILD INFERIOR  . IHD (ischemic heart disease)    Remote MI in 2001 with stent to the LCX  . Lactose intolerance   . Non-small cell carcinoma of lung ([place])    Dx in October of 2010  . Normal nuclear stress test 2009   EF 63%. No ischemia. 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  . Pericardial effusion July 2011   s/p pericardial window; last echo in August 2012 with minimal effusion  . Pleural effusion, left   . SOB (shortness of breath)    Chronic with known fibrosis  . Status post chemotherapy 06/04/2009 -  09/06/2009    4 cycles of Cisplatin and Gemcitabine with Neulasta Support   . Vertigo    hx. of- ambulates with cane for safety.    Past Surgical History:  Procedure Laterality Date  . CARDIAC CATHETERIZATION  07/11/2003   EF 60%, REPEAT, WHICH SHOWED 30% PROXIMAL NARROWING IN THE RIGHT  CORONARY ARTERY  . CARDIOVASCULAR STRESS TEST  05/2007   EF 63%  . CATARACT EXTRACTION, BILATERAL Bilateral   . COLONOSCOPY WITH  PROPOFOL N/A 01/30/2015   Procedure: COLONOSCOPY WITH PROPOFOL;  Surgeon: Garlan Fair, MD;  Location: [facility];  Service: Endoscopy;  Laterality: N/A;  . CORONARY STENT PLACEMENT  07/1999   STENT TO THE LEFT CIRCUMFLEX  . HERNIA REPAIR     as an adult- R inguinal   . PERICARDIAL WINDOW  July 2011  . TRANSTHORACIC ECHOCARDIOGRAM  02/22/2010   EF 55-60%     Medications: Current Meds  Medication Sig  . albuterol (PROVENTIL HFA;VENTOLIN HFA) 108 (90 Base) MCG/ACT inhaler Inhale 2 puffs into the lungs every 6 (six) hours as needed for wheezing or shortness of breath.  Marland Kitchen albuterol (PROVENTIL) (2.5 MG/3ML) 0.083% nebulizer solution Take 3 mLs (2.5 mg total) by nebulization every 4 (four) hours as needed for wheezing.  Marland Kitchen  aspirin EC 81 MG tablet Take 81 mg by mouth daily.  Marland Kitchen atorvastatin (LIPITOR) 20 MG tablet TAKE 1 TABLET BY MOUTH DAILY (Patient taking differently: Take 20 mg by mouth once a day)  . buPROPion (WELLBUTRIN SR) 150 MG 12 hr tablet Take 150 mg by mouth daily.   . Cholecalciferol (VITAMIN D3) 2000 units TABS Take 2,000 Units by mouth 2 (two) times daily.   . furosemide (LASIX) 20 MG tablet Take 1 tablet (20 mg total) by mouth daily.  Marland Kitchen guaiFENesin (MUCINEX) 600 MG 12 hr tablet Take 1 tablet (600 mg total) by mouth 2 (two) times daily.  Marland Kitchen ipratropium-albuterol (DUONEB) 0.5-2.5 (3) MG/3ML SOLN Take 3 mLs by nebulization every 6 (six) hours as needed.  . metoprolol tartrate (LOPRESSOR) 25 MG tablet TAKE 1/2 TABLET BY MOUTH TWICE DAILY  . Tamsulosin HCl (FLOMAX) 0.4 MG CAPS Take 0.4 mg by mouth daily with supper.   . temazepam (RESTORIL) 30 MG capsule Take 30 mg by mouth at bedtime  . venlafaxine XR (EFFEXOR-XR) 150 MG 24 hr capsule Take 150 mg by mouth daily.      Allergies: Allergies  Allergen Reactions  . Sertraline Hcl     Patient does not remember the reaction    Social History: The patient  reports that he quit smoking about 23 years ago. His smoking use included cigarettes. He has a 120.00 pack-year smoking history. He has never used smokeless tobacco. He reports that he does not drink alcohol or use drugs.   Family History: The patient's family history includes Lung cancer in his father; Stroke in his sister.   Review of Systems: Please see the history of present illness.   Otherwise, the review of systems is positive for none.   All other systems are reviewed and negative.   Physical Exam: VS:  BP 110/68 (BP Location: Left Arm, Patient Position: Sitting, Cuff Size: Normal)   Pulse 98   Ht 5\' 10"  (1.778 m)   Wt 198 lb 12.8 oz (90.2 kg)   SpO2 95% Comment: at rest  BMI 28.52 kg/m  .  BMI Body mass index is 28.52 kg/m.  Wt Readings from Last 3 Encounters:  07/07/17 198 lb 12.8  oz (90.2 kg)  05/12/17 196 lb (88.9 kg)  02/02/17 191 lb 1.3 oz (86.7 kg)    General: Pleasant. He looks younger than his stated age. He is alert and in no acute distress.  His weight is basically the same since my last visit.  HEENT: Normal.  Neck: Supple, no JVD, carotid bruits, or masses noted.  Cardiac: Regular rate and rhythm. Has a definite S3 on exam today. No edema.  Respiratory:  Lungs are coarse bilaterally  with increased work of breathing. Some scattered wheezing noted.  GI: Soft and nontender.  MS: No deformity or atrophy. Gait and ROM intact. Now using a cane.  Skin: Warm and dry. Color is normal.  Neuro:  Strength and sensation are intact and no gross focal deficits noted.  Psych: Alert, appropriate and with normal affect.   LABORATORY DATA:  EKG:  EKG is not ordered today.  Lab Results  Component Value Date   WBC 7.3 06/18/2017   HGB 15.2 02/02/2017   HCT 43.2 06/18/2017   PLT 241 06/18/2017   GLUCOSE 95 06/18/2017   CHOL 147 06/24/2016   TRIG 158 (H) 06/24/2016   HDL 70 06/24/2016   LDLCALC 45 06/24/2016   ALT 13 06/18/2017   AST 17 06/18/2017   NA 141 06/18/2017   K 4.1 06/18/2017   CL 107 06/18/2017   CREATININE 0.82 06/18/2017   BUN 15 06/18/2017   CO2 27 06/18/2017   TSH 5.020 (H) 06/24/2016   INR 1.17 12/20/2014   HGBA1C 5.6 12/20/2014       BNP (last 3 results) No results for input(s): BNP in the last 8760 hours.  ProBNP (last 3 results) No results for input(s): PROBNP in the last 8760 hours.   Other Studies Reviewed Today:  Myoview Study Highlights from 04/2015   Nuclear stress EF: 56%.  The study is normal.  This is a low risk study.  The left ventricular ejection fraction is normal (55-65%).  Normal resting and stress perfusion EF 56%    CXR IMPRESSION: Chronic volume loss with loculated effusions on the left. Scarring right base. Stable cardiac silhouette. No new opacity. Overall stable study. Electronically  Signed By: Lowella Grip III M.D. On: 10/29/2015 16:13  CT CHEST  1. Today's study demonstrates stable postradiation changes of mass-like fibrosis both in the paramediastinal aspect of the left upper lobe and in the central infrahilar region of the right lower lobe. No definite findings to suggest local recurrence of disease or definite metastatic disease in the thorax. 2. There is a new nondisplaced fracture of the posterolateral aspect of the left sixth rib. This may be pathologic secondary to postradiation osteonecrosis given the proximity to the treated area in the left lung. The possibility of a metastatic lesion with nondisplaced fracture is not entirely excluded, however, and close attention on followup studies is recommended. 3. Small partially loculated pericardial effusion which exerts slight mass effect upon the right heart. These findings are very similar to prior examinations. No pericardial calcification. 4. Aortic atherosclerosis, in addition to left main and 3 vessel coronary artery disease, with evidence of scarring from prior left circumflex coronary artery territory myocardial infarction, as above. Assessment for potential risk factor modification, dietary therapy or pharmacologic therapy may be warranted, if clinically indicated. 5. Additional incidental findings, as above. Electronically Signed By: Vinnie Langton M.D. On: 10/29/2015 16:54   Echo Study Conclusions 12/2014  - Left ventricle: The cavity size was normal. Wall thickness was increased in a pattern of moderate LVH. Systolic function was normal. The estimated ejection fraction was in the range of 60% to 65%. Wall motion was normal; there were no regional wall motion abnormalities. Doppler parameters are consistent with abnormal left ventricular relaxation (grade 1 diastolic dysfunction). The E/e&' ratio is >15, suggesting elevated LV filling pressure. - Aortic  valve: Mildly calcified- there may be fusion of the non and right coronary cusps. There was mild regurgitation. - Mitral valve: Mildly thickened leaflets . There was mild regurgitation. -  Left atrium: The atrium was normal in size. - Tricuspid valve: There was mild regurgitation. - Pulmonary arteries: PA peak pressure: 37 mm Hg (S). - Inferior vena cava: The vessel was normal in size. The respirophasic diameter changes were in the normal range (>= 50%), consistent with normal central venous pressure. - Pericardium, extracardiac: A trivial pericardial effusion was identified. Features were not consistent with tamponade physiology.  Impressions:  - Compared to a prior echo in 2012, there are a few changes. LV filling pressures appear elevated. There is a persistent trivial pericardial effusion without tamponade features. RVSP is higher and there is now moderate LVH.   Assessment/Plan: 1. Chronic shortness of breath - sounds like he has an S3 on exam - I would like to get an echocardiogram to make sure he has not had worsening EF. For now, no changes - further disposition to follow.   2. CAD - no active chest pain noted. Would favor more conservative management. Low risk Myoview from January of 2017.   3. Memory impairment - he has really declined over the last several years. He admits his memory is getting worse.  Not discussed today.   4. Past pericardial effusion - s/p window - lastCT findings basically unchanged. Getting echo updated due to abnormal heart sounds on exam.   5. Prior lung cancers/melanoma - followed byDr. Marin Olp in [place].  6. HLD- on statin therapy - labs from January noted.   Current medicines are reviewed with the patient today.  The patient does not have concerns regarding medicines other than what has been noted above.  The following changes have been made:  See above.  Labs/ tests ordered today include:   No orders of the  defined types were placed in this encounter.    Disposition:   FU with me in 6 months unless the echo is abnormal.   Patient is agreeable to this plan and will call if any problems develop in the interim.   SignedTruitt Merle, NP  07/07/2017 10:03 AM  [facility] [address] [place], [place]  [address] Phone: [phone] Fax: [phone]

## 2017-07-07 NOTE — Patient Instructions (Addendum)
We will be checking the following labs today - NONE   Medication Instructions:    Continue with your current medicines.     Testing/Procedures To Be Arranged:  Echocardiogram  Follow-Up:   Will tentatively see you in 6 months - unless your echo is worrisome - then I will see you back sooner.     Other Special Instructions:   N/A    If you need a refill on your cardiac medications before your next appointment, please call your pharmacy.   Call the [place] office at [phone] if you have any questions, problems or concerns.

## 2017-07-14 ENCOUNTER — Ambulatory Visit (HOSPITAL_COMMUNITY): Payer: Medicare Other | Attending: Cardiovascular Disease

## 2017-07-14 ENCOUNTER — Other Ambulatory Visit: Payer: Self-pay

## 2017-07-14 ENCOUNTER — Ambulatory Visit (INDEPENDENT_AMBULATORY_CARE_PROVIDER_SITE_OTHER): Payer: Self-pay | Admitting: Orthopaedic Surgery

## 2017-07-14 DIAGNOSIS — I255 Ischemic cardiomyopathy: Secondary | ICD-10-CM | POA: Diagnosis not present

## 2017-07-14 DIAGNOSIS — J439 Emphysema, unspecified: Secondary | ICD-10-CM | POA: Insufficient documentation

## 2017-07-14 DIAGNOSIS — E785 Hyperlipidemia, unspecified: Secondary | ICD-10-CM | POA: Insufficient documentation

## 2017-07-14 DIAGNOSIS — J438 Other emphysema: Secondary | ICD-10-CM

## 2017-07-14 DIAGNOSIS — I509 Heart failure, unspecified: Secondary | ICD-10-CM | POA: Diagnosis not present

## 2017-07-14 DIAGNOSIS — R0602 Shortness of breath: Secondary | ICD-10-CM | POA: Insufficient documentation

## 2017-07-14 DIAGNOSIS — I259 Chronic ischemic heart disease, unspecified: Secondary | ICD-10-CM | POA: Diagnosis not present

## 2017-07-14 DIAGNOSIS — I252 Old myocardial infarction: Secondary | ICD-10-CM | POA: Diagnosis not present

## 2017-07-14 DIAGNOSIS — I083 Combined rheumatic disorders of mitral, aortic and tricuspid valves: Secondary | ICD-10-CM | POA: Insufficient documentation

## 2017-07-14 DIAGNOSIS — Z87891 Personal history of nicotine dependence: Secondary | ICD-10-CM | POA: Insufficient documentation

## 2017-07-14 DIAGNOSIS — I110 Hypertensive heart disease with heart failure: Secondary | ICD-10-CM | POA: Insufficient documentation

## 2017-07-14 DIAGNOSIS — I313 Pericardial effusion (noninflammatory): Secondary | ICD-10-CM | POA: Insufficient documentation

## 2017-07-14 DIAGNOSIS — Z923 Personal history of irradiation: Secondary | ICD-10-CM | POA: Insufficient documentation

## 2017-07-14 DIAGNOSIS — Z9221 Personal history of antineoplastic chemotherapy: Secondary | ICD-10-CM | POA: Diagnosis not present

## 2017-07-14 DIAGNOSIS — Z85118 Personal history of other malignant neoplasm of bronchus and lung: Secondary | ICD-10-CM | POA: Insufficient documentation

## 2017-07-21 ENCOUNTER — Encounter (INDEPENDENT_AMBULATORY_CARE_PROVIDER_SITE_OTHER): Payer: Self-pay | Admitting: Orthopaedic Surgery

## 2017-07-21 ENCOUNTER — Ambulatory Visit (INDEPENDENT_AMBULATORY_CARE_PROVIDER_SITE_OTHER): Payer: Medicare Other | Admitting: Orthopaedic Surgery

## 2017-07-21 DIAGNOSIS — M65332 Trigger finger, left middle finger: Secondary | ICD-10-CM

## 2017-07-21 MED ORDER — BUPIVACAINE HCL 0.5 % IJ SOLN
0.3300 mL | INTRAMUSCULAR | Status: AC | PRN
  Administered 2017-07-21: .33 mL

## 2017-07-21 MED ORDER — LIDOCAINE HCL 1 % IJ SOLN
0.3000 mL | INTRAMUSCULAR | Status: AC | PRN
  Administered 2017-07-21: .3 mL

## 2017-07-21 MED ORDER — METHYLPREDNISOLONE ACETATE 40 MG/ML IJ SUSP
13.3300 mg | INTRAMUSCULAR | Status: AC | PRN
  Administered 2017-07-21: 13.33 mg

## 2017-07-21 NOTE — Progress Notes (Signed)
Office Visit Note   Patient: [name] Fields           Date of Birth: [birth year]           MRN: [id number] Visit Date: 07/21/2017              Requested by: [name], MD [address] [place], [place] [address] PCP: [name], MD   Assessment & Plan: Visit Diagnoses:  1. Trigger finger, left middle finger     Plan: Impression is left middle trigger finger.  Cortisone injection performed today.  Patient tolerates well.  Follow-up as needed.  Follow-Up Instructions: Return if symptoms worsen or fail to improve.   Orders:  No orders of the defined types were placed in this encounter.  No orders of the defined types were placed in this encounter.     Procedures: Hand/UE Inj: L long A1 for trigger finger on 07/21/2017 9:39 AM Indications: pain Details: 25 G needle Medications: 0.3 mL lidocaine 1 %; 0.33 mL bupivacaine 0.5 %; 13.33 mg methylPREDNISolone acetate 40 MG/ML Outcome: tolerated well, no immediate complications Consent was given by the patient. Patient was prepped and draped in the usual sterile fashion.       Clinical Data: No additional findings.   Subjective: Chief Complaint  Patient presents with  . Left Middle Finger - Pain    [name] Fields is a 83 year old gentleman who walks with a cane with his left hand comes in with acute left middle trigger finger.  He denies history of diabetes, hypothyroidism, rheumatoid arthritis.  Denies any injuries.  Denies any numbness and tingling.   Review of Systems  Constitutional: Negative.   All other systems reviewed and are negative.    Objective: Vital Signs: There were no vitals taken for this visit.  Physical Exam  Constitutional: He is oriented to person, place, and time. He appears well-developed and well-nourished.  HENT:  Head: Normocephalic and atraumatic.  Eyes: Pupils are equal, round, and reactive to light.  Neck: Neck supple.  Pulmonary/Chest: Effort normal.  Abdominal:  Soft.  Musculoskeletal: Normal range of motion.  Neurological: He is alert and oriented to person, place, and time.  Skin: Skin is warm.  Psychiatric: He has a normal mood and affect. His behavior is normal. Judgment and thought content normal.  Nursing note and vitals reviewed.   Ortho Exam Left hand exam shows tender A1 pulley of the middle finger..  He has triggering and pain of the left middle finger. Specialty Comments:  No specialty comments available.  Imaging: No results found.   PMFS History: Patient Active Problem List   Diagnosis Date Noted  . Chest pain 11/04/2016  . Vertigo   . Alpha-1-antitrypsin deficiency carrier ([place]) 03/20/2016  . Restrictive lung disease 12/18/2015  . Actinic keratoses 07/17/2015  . Allergic rhinitis 07/17/2015  . Atherosclerotic heart disease of native coronary artery without angina pectoris 07/17/2015  . Basal cell carcinoma of skin of trunk 07/17/2015  . CFIDS (chronic fatigue and immune dysfunction syndrome) ([place]) 07/17/2015  . CN (constipation) 07/17/2015  . Deltoid tendinitis 07/17/2015  . Enlarged prostate 07/17/2015  . Gastric irritation 07/17/2015  . Cephalalgia 07/17/2015  . LAFB (left anterior fascicular block) 07/17/2015  . Major depressive disorder with single episode 07/17/2015  . Avitaminosis D 07/17/2015  . Hemorrhoids without complication 28/31/5176  . Body tinea 07/17/2015  . Arthralgia of shoulder 07/17/2015  . Arthralgia of lower leg 07/17/2015  . Arthritis of knee, degenerative 07/17/2015  .  Melanoma in situ ([place]) 07/17/2015  . ED (erectile dysfunction) of organic origin 07/17/2015  . Breathlessness on exertion 06/25/2015  . Non-small cell lung cancer ([place]) 12/21/2014  . Thrombocytopenia ([place]) 12/19/2014  . HTN (hypertension) 12/19/2014  . GERD (gastroesophageal reflux disease) 12/19/2014  . Skin lesion of face 04/19/2013  . Old myocardial infarction 01/27/2013  . Pulmonary nodule 08/27/2011  . PVC's  (premature ventricular contractions) 01/13/2011  . Pericardial effusion 11/04/2010  . COPD (chronic obstructive pulmonary disease) with emphysema ([place]) 03/04/2010  . Cancer of lower lobe of right lung ([place]) 02/06/2010  . HYPERLIPIDEMIA 02/05/2010  . MELANOMA, HX OF 02/05/2010   Past Medical History:  Diagnosis Date  . Anxiety    takes wellbutrin daily  . Arthritis    knees  . COPD (chronic obstructive pulmonary disease) ([place])   . Diverticulosis   . Emphysema of lung ([place])   . GERD (gastroesophageal reflux disease)   . Hearing loss   . Hx of radiation therapy 09/29/11;10/01/11;10/03/11;10/06/11;10/08/11;   RLLlung,50Gy/21fx - SBRT  . Hyperlipidemia   . Hypertension   . Hypokinesia    MILD INFERIOR  . IHD (ischemic heart disease)    Remote MI in 2001 with stent to the LCX  . Lactose intolerance   . Non-small cell carcinoma of lung ([place])    Dx in October of 2010  . Normal nuclear stress test 2009   EF 63%. No ischemia. 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  . Pericardial effusion July 2011   s/p pericardial window; last echo in August 2012 with minimal effusion  . Pleural effusion, left   . SOB (shortness of breath)    Chronic with known fibrosis  . Status post chemotherapy 06/04/2009 -  09/06/2009    4 cycles of Cisplatin and Gemcitabine with Neulasta Support   . Vertigo    hx. of- ambulates with cane for safety.    Family History  Problem Relation Age of Onset  . Lung cancer Father   . Stroke Sister   . Heart disease Neg Hx   . Anesthesia problems Neg Hx   . Lung disease Neg Hx     Past Surgical History:  Procedure Laterality Date  . CARDIAC CATHETERIZATION  07/11/2003   EF 60%, REPEAT, WHICH SHOWED 30% PROXIMAL NARROWING IN THE RIGHT  CORONARY ARTERY  . CARDIOVASCULAR STRESS TEST  05/2007   EF 63%  . CATARACT EXTRACTION, BILATERAL Bilateral   . COLONOSCOPY WITH PROPOFOL N/A 01/30/2015   Procedure: COLONOSCOPY WITH PROPOFOL;  Surgeon: Garlan Fair, MD;  Location: [facility];   Service: Endoscopy;  Laterality: N/A;  . CORONARY STENT PLACEMENT  07/1999   STENT TO THE LEFT CIRCUMFLEX  . HERNIA REPAIR     as an adult- R inguinal   . PERICARDIAL WINDOW  July 2011  . TRANSTHORACIC ECHOCARDIOGRAM  02/22/2010   EF 55-60%   Social History   Occupational History  . Occupation: retired    Fish farm manager: RETIRED    Comment: [employer]  Tobacco Use  . Smoking status: Former Smoker    Packs/day: 3.00    Years: 40.00    Pack years: 120.00    Types: Cigarettes    Last attempt to quit: 04/07/1994    Years since quitting: 23.3  . Smokeless tobacco: Never Used  Substance and Sexual Activity  . Alcohol use: No    Alcohol/week: 0.0 oz  . Drug use: No  . Sexual activity: Never

## 2017-07-22 ENCOUNTER — Other Ambulatory Visit: Payer: Self-pay | Admitting: *Deleted

## 2017-07-22 DIAGNOSIS — R0602 Shortness of breath: Secondary | ICD-10-CM

## 2017-07-22 MED ORDER — FUROSEMIDE 40 MG PO TABS: 40.0000 mg | ORAL_TABLET | Freq: Every day | ORAL | 0 refills | Status: DC

## 2017-07-29 ENCOUNTER — Other Ambulatory Visit: Payer: Medicare Other | Admitting: *Deleted

## 2017-07-29 DIAGNOSIS — R0602 Shortness of breath: Secondary | ICD-10-CM | POA: Diagnosis not present

## 2017-07-29 LAB — BASIC METABOLIC PANEL
BUN/Creatinine Ratio: 20 (ref 10–24)
BUN: 22 mg/dL (ref 8–27)
CO2: 26 mmol/L (ref 20–29)
Calcium: 9.8 mg/dL (ref 8.6–10.2)
Chloride: 98 mmol/L (ref 96–106)
Creatinine, Ser: 1.11 mg/dL (ref 0.76–1.27)
GFR calc Af Amer: 72 mL/min/{1.73_m2} (ref >59)
GFR calc non Af Amer: 62 mL/min/{1.73_m2} (ref >59)
Glucose: 85 mg/dL (ref 65–99)
Potassium: 3.7 mmol/L (ref 3.5–5.2)
Sodium: 142 mmol/L (ref 134–144)

## 2017-07-29 LAB — PRO B NATRIURETIC PEPTIDE: NT-Pro BNP: 525 pg/mL — ABNORMAL HIGH (ref 0–486)

## 2017-08-07 ENCOUNTER — Other Ambulatory Visit: Payer: Self-pay | Admitting: Cardiology

## 2017-08-11 ENCOUNTER — Inpatient Hospital Stay: Payer: Medicare Other | Attending: Hematology & Oncology

## 2017-08-11 ENCOUNTER — Other Ambulatory Visit: Payer: Self-pay

## 2017-08-11 VITALS — BP 136/80 | HR 84 | Temp 97.7°F

## 2017-08-11 DIAGNOSIS — C3431 Malignant neoplasm of lower lobe, right bronchus or lung: Secondary | ICD-10-CM

## 2017-08-11 DIAGNOSIS — Z85118 Personal history of other malignant neoplasm of bronchus and lung: Secondary | ICD-10-CM | POA: Insufficient documentation

## 2017-08-11 DIAGNOSIS — Z452 Encounter for adjustment and management of vascular access device: Secondary | ICD-10-CM | POA: Diagnosis not present

## 2017-08-11 MED ORDER — SODIUM CHLORIDE 0.9% FLUSH
10.0000 mL | INTRAVENOUS | Status: DC | PRN
  Administered 2017-08-11: 10 mL via INTRAVENOUS
  Filled 2017-08-11: qty 10

## 2017-08-11 MED ORDER — HEPARIN SOD (PORK) LOCK FLUSH 100 UNIT/ML IV SOLN
500.0000 [IU] | Freq: Once | INTRAVENOUS | Status: AC
  Administered 2017-08-11: 500 [IU] via INTRAVENOUS
  Filled 2017-08-11: qty 5

## 2017-08-12 DIAGNOSIS — K30 Functional dyspepsia: Secondary | ICD-10-CM | POA: Diagnosis not present

## 2017-08-12 DIAGNOSIS — L989 Disorder of the skin and subcutaneous tissue, unspecified: Secondary | ICD-10-CM | POA: Diagnosis not present

## 2017-08-12 DIAGNOSIS — J441 Chronic obstructive pulmonary disease with (acute) exacerbation: Secondary | ICD-10-CM | POA: Diagnosis not present

## 2017-08-23 DIAGNOSIS — R069 Unspecified abnormalities of breathing: Secondary | ICD-10-CM | POA: Diagnosis not present

## 2017-08-24 DIAGNOSIS — I440 Atrioventricular block, first degree: Secondary | ICD-10-CM | POA: Diagnosis not present

## 2017-08-24 DIAGNOSIS — C3492 Malignant neoplasm of unspecified part of left bronchus or lung: Secondary | ICD-10-CM | POA: Diagnosis present

## 2017-08-24 DIAGNOSIS — J9809 Other diseases of bronchus, not elsewhere classified: Secondary | ICD-10-CM | POA: Diagnosis not present

## 2017-08-24 DIAGNOSIS — Z801 Family history of malignant neoplasm of trachea, bronchus and lung: Secondary | ICD-10-CM | POA: Diagnosis not present

## 2017-08-24 DIAGNOSIS — Z7982 Long term (current) use of aspirin: Secondary | ICD-10-CM | POA: Diagnosis not present

## 2017-08-24 DIAGNOSIS — F418 Other specified anxiety disorders: Secondary | ICD-10-CM | POA: Diagnosis present

## 2017-08-24 DIAGNOSIS — D72829 Elevated white blood cell count, unspecified: Secondary | ICD-10-CM | POA: Diagnosis not present

## 2017-08-24 DIAGNOSIS — J189 Pneumonia, unspecified organism: Secondary | ICD-10-CM | POA: Diagnosis not present

## 2017-08-24 DIAGNOSIS — C3431 Malignant neoplasm of lower lobe, right bronchus or lung: Secondary | ICD-10-CM | POA: Diagnosis not present

## 2017-08-24 DIAGNOSIS — Z87891 Personal history of nicotine dependence: Secondary | ICD-10-CM | POA: Diagnosis not present

## 2017-08-24 DIAGNOSIS — I2510 Atherosclerotic heart disease of native coronary artery without angina pectoris: Secondary | ICD-10-CM | POA: Diagnosis not present

## 2017-08-24 DIAGNOSIS — J90 Pleural effusion, not elsewhere classified: Secondary | ICD-10-CM | POA: Diagnosis not present

## 2017-08-24 DIAGNOSIS — R0603 Acute respiratory distress: Secondary | ICD-10-CM | POA: Diagnosis not present

## 2017-08-24 DIAGNOSIS — I252 Old myocardial infarction: Secondary | ICD-10-CM | POA: Diagnosis not present

## 2017-08-24 DIAGNOSIS — J9601 Acute respiratory failure with hypoxia: Secondary | ICD-10-CM | POA: Diagnosis not present

## 2017-08-24 DIAGNOSIS — Z9981 Dependence on supplemental oxygen: Secondary | ICD-10-CM | POA: Diagnosis not present

## 2017-08-24 DIAGNOSIS — F419 Anxiety disorder, unspecified: Secondary | ICD-10-CM | POA: Diagnosis not present

## 2017-08-24 DIAGNOSIS — Z66 Do not resuscitate: Secondary | ICD-10-CM | POA: Diagnosis present

## 2017-08-24 DIAGNOSIS — Z9221 Personal history of antineoplastic chemotherapy: Secondary | ICD-10-CM | POA: Diagnosis not present

## 2017-08-24 DIAGNOSIS — F329 Major depressive disorder, single episode, unspecified: Secondary | ICD-10-CM | POA: Diagnosis not present

## 2017-08-24 DIAGNOSIS — T17590A Other foreign object in bronchus causing asphyxiation, initial encounter: Secondary | ICD-10-CM | POA: Diagnosis not present

## 2017-08-24 DIAGNOSIS — I4510 Unspecified right bundle-branch block: Secondary | ICD-10-CM | POA: Diagnosis not present

## 2017-08-24 DIAGNOSIS — R0600 Dyspnea, unspecified: Secondary | ICD-10-CM | POA: Diagnosis not present

## 2017-08-24 DIAGNOSIS — Z923 Personal history of irradiation: Secondary | ICD-10-CM | POA: Diagnosis not present

## 2017-08-24 DIAGNOSIS — R918 Other nonspecific abnormal finding of lung field: Secondary | ICD-10-CM | POA: Diagnosis not present

## 2017-08-24 DIAGNOSIS — Z85118 Personal history of other malignant neoplasm of bronchus and lung: Secondary | ICD-10-CM | POA: Diagnosis not present

## 2017-08-24 DIAGNOSIS — Z951 Presence of aortocoronary bypass graft: Secondary | ICD-10-CM | POA: Diagnosis not present

## 2017-08-24 DIAGNOSIS — Z955 Presence of coronary angioplasty implant and graft: Secondary | ICD-10-CM | POA: Diagnosis not present

## 2017-08-24 DIAGNOSIS — J9811 Atelectasis: Secondary | ICD-10-CM | POA: Diagnosis not present

## 2017-08-24 DIAGNOSIS — J9621 Acute and chronic respiratory failure with hypoxia: Secondary | ICD-10-CM | POA: Diagnosis not present

## 2017-08-24 DIAGNOSIS — Z823 Family history of stroke: Secondary | ICD-10-CM | POA: Diagnosis not present

## 2017-08-24 DIAGNOSIS — I499 Cardiac arrhythmia, unspecified: Secondary | ICD-10-CM | POA: Diagnosis not present

## 2017-08-24 DIAGNOSIS — J441 Chronic obstructive pulmonary disease with (acute) exacerbation: Secondary | ICD-10-CM | POA: Diagnosis not present

## 2017-08-28 MED ORDER — ATORVASTATIN CALCIUM 10 MG PO TABS
20.00 | ORAL_TABLET | ORAL | Status: DC
Start: 2017-08-29 — End: 2017-08-28

## 2017-08-28 MED ORDER — IPRATROPIUM BROMIDE 0.02 % IN SOLN
.50 | RESPIRATORY_TRACT | Status: DC
Start: 2017-08-28 — End: 2017-08-28

## 2017-08-28 MED ORDER — ALBUTEROL SULFATE (2.5 MG/3ML) 0.083% IN NEBU
2.50 | INHALATION_SOLUTION | RESPIRATORY_TRACT | Status: DC
Start: 2017-08-28 — End: 2017-08-28

## 2017-08-28 MED ORDER — AMOXICILLIN-POT CLAVULANATE 875-125 MG PO TABS
875.00 | ORAL_TABLET | ORAL | Status: DC
Start: ? — End: 2017-08-28

## 2017-08-28 MED ORDER — TEMAZEPAM 15 MG PO CAPS
15.00 | ORAL_CAPSULE | ORAL | Status: DC
Start: ? — End: 2017-08-28

## 2017-08-28 MED ORDER — ASPIRIN EC 81 MG PO TBEC
81.00 | DELAYED_RELEASE_TABLET | ORAL | Status: DC
Start: 2017-08-29 — End: 2017-08-28

## 2017-08-28 MED ORDER — ACETAMINOPHEN 325 MG PO TABS
650.00 | ORAL_TABLET | ORAL | Status: DC
Start: ? — End: 2017-08-28

## 2017-08-28 MED ORDER — FUROSEMIDE 40 MG PO TABS
40.00 | ORAL_TABLET | ORAL | Status: DC
Start: 2017-08-29 — End: 2017-08-28

## 2017-08-28 MED ORDER — FLUTICASONE FUROATE-VILANTEROL 200-25 MCG/INH IN AEPB
1.00 | INHALATION_SPRAY | RESPIRATORY_TRACT | Status: DC
Start: 2017-08-29 — End: 2017-08-28

## 2017-08-28 MED ORDER — GUAIFENESIN ER 600 MG PO TB12
600.00 | ORAL_TABLET | ORAL | Status: DC
Start: 2017-08-28 — End: 2017-08-28

## 2017-08-28 MED ORDER — ONDANSETRON HCL 4 MG/2ML IJ SOLN
4.00 | INTRAMUSCULAR | Status: DC
Start: ? — End: 2017-08-28

## 2017-08-28 MED ORDER — BUPROPION HCL ER (SR) 150 MG PO TB12
150.00 | ORAL_TABLET | ORAL | Status: DC
Start: 2017-08-29 — End: 2017-08-28

## 2017-08-28 MED ORDER — TAMSULOSIN HCL 0.4 MG PO CAPS
0.40 | ORAL_CAPSULE | ORAL | Status: DC
Start: 2017-08-29 — End: 2017-08-28

## 2017-08-28 MED ORDER — METOPROLOL TARTRATE 25 MG PO TABS
12.50 | ORAL_TABLET | ORAL | Status: DC
Start: 2017-08-28 — End: 2017-08-28

## 2017-08-28 MED ORDER — ENOXAPARIN SODIUM 40 MG/0.4ML ~~LOC~~ SOLN
40.00 | SUBCUTANEOUS | Status: DC
Start: 2017-08-28 — End: 2017-08-28

## 2017-08-28 MED ORDER — PREDNISONE 20 MG PO TABS
20.00 | ORAL_TABLET | ORAL | Status: DC
Start: 2017-08-29 — End: 2017-08-28

## 2017-08-28 MED ORDER — VENLAFAXINE HCL ER 150 MG PO CP24
150.00 | ORAL_CAPSULE | ORAL | Status: DC
Start: 2017-08-29 — End: 2017-08-28

## 2017-08-28 MED ORDER — IPRATROPIUM BROMIDE 0.02 % IN SOLN
.50 | RESPIRATORY_TRACT | Status: DC
Start: ? — End: 2017-08-28

## 2017-08-28 MED ORDER — AZITHROMYCIN 250 MG PO TABS
500.00 | ORAL_TABLET | ORAL | Status: DC
Start: 2017-08-29 — End: 2017-08-28

## 2017-08-28 MED ORDER — ALBUTEROL SULFATE (2.5 MG/3ML) 0.083% IN NEBU
2.50 | INHALATION_SOLUTION | RESPIRATORY_TRACT | Status: DC
Start: ? — End: 2017-08-28

## 2017-08-31 DIAGNOSIS — Z792 Long term (current) use of antibiotics: Secondary | ICD-10-CM | POA: Diagnosis not present

## 2017-08-31 DIAGNOSIS — J441 Chronic obstructive pulmonary disease with (acute) exacerbation: Secondary | ICD-10-CM | POA: Diagnosis not present

## 2017-08-31 DIAGNOSIS — Z7952 Long term (current) use of systemic steroids: Secondary | ICD-10-CM | POA: Diagnosis not present

## 2017-08-31 DIAGNOSIS — Z85118 Personal history of other malignant neoplasm of bronchus and lung: Secondary | ICD-10-CM | POA: Diagnosis not present

## 2017-09-03 ENCOUNTER — Inpatient Hospital Stay: Payer: Medicare Other | Admitting: Emergency Medicine

## 2017-09-08 DIAGNOSIS — Z7952 Long term (current) use of systemic steroids: Secondary | ICD-10-CM | POA: Diagnosis not present

## 2017-09-08 DIAGNOSIS — Z792 Long term (current) use of antibiotics: Secondary | ICD-10-CM | POA: Diagnosis not present

## 2017-09-08 DIAGNOSIS — Z85118 Personal history of other malignant neoplasm of bronchus and lung: Secondary | ICD-10-CM | POA: Diagnosis not present

## 2017-09-08 DIAGNOSIS — J441 Chronic obstructive pulmonary disease with (acute) exacerbation: Secondary | ICD-10-CM | POA: Diagnosis not present

## 2017-09-09 DIAGNOSIS — K1379 Other lesions of oral mucosa: Secondary | ICD-10-CM | POA: Diagnosis not present

## 2017-09-09 DIAGNOSIS — R269 Unspecified abnormalities of gait and mobility: Secondary | ICD-10-CM | POA: Diagnosis not present

## 2017-09-09 DIAGNOSIS — J441 Chronic obstructive pulmonary disease with (acute) exacerbation: Secondary | ICD-10-CM | POA: Diagnosis not present

## 2017-09-10 DIAGNOSIS — Z85118 Personal history of other malignant neoplasm of bronchus and lung: Secondary | ICD-10-CM | POA: Diagnosis not present

## 2017-09-10 DIAGNOSIS — Z792 Long term (current) use of antibiotics: Secondary | ICD-10-CM | POA: Diagnosis not present

## 2017-09-10 DIAGNOSIS — J441 Chronic obstructive pulmonary disease with (acute) exacerbation: Secondary | ICD-10-CM | POA: Diagnosis not present

## 2017-09-10 DIAGNOSIS — Z7952 Long term (current) use of systemic steroids: Secondary | ICD-10-CM | POA: Diagnosis not present

## 2017-09-14 ENCOUNTER — Ambulatory Visit (INDEPENDENT_AMBULATORY_CARE_PROVIDER_SITE_OTHER): Payer: Medicare Other | Admitting: Emergency Medicine

## 2017-09-14 ENCOUNTER — Encounter: Payer: Self-pay | Admitting: Emergency Medicine

## 2017-09-14 DIAGNOSIS — I259 Chronic ischemic heart disease, unspecified: Secondary | ICD-10-CM

## 2017-09-14 DIAGNOSIS — J438 Other emphysema: Secondary | ICD-10-CM | POA: Diagnosis not present

## 2017-09-14 MED ORDER — TIOTROPIUM BROMIDE-OLODATEROL 2.5-2.5 MCG/ACT IN AERS: 2.0000 | INHALATION_SPRAY | Freq: Every day | RESPIRATORY_TRACT | 0 refills | Status: DC

## 2017-09-14 NOTE — Addendum Note (Signed)
Addended by: Desmond Dike C on: 09/14/2017 09:48 AM   Modules accepted: Orders

## 2017-09-14 NOTE — Progress Notes (Signed)
Subjective:    Patient ID: [name] Fields, male    DOB: [birth year], 83 y.o.   MRN: [id number]  HPI 83 year old gentleman previously followed here by Dr. Ashok Cordia.  He has a significant tobacco history (120 pack years) with associated COPD.  A-1 AT MS genotype. He also has a history of non-small cell lung cancer, separate primaries one in the right lower lobe and one in the left lung, treated with chemoradiation.  Most recently treated for a stage I in 10/2011.  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  He has had both Pneumovax and Prevnar. He complains of exertion SOB, can happen with going from sitting to standing. He doesn't walk very much now - decreased about 2 years ago. Last 6 minutes walk was 01/2017 > no desaturation. He wheezes with exertion and most mornings when he gets up. He is using DuoNeb qid on a schedule.  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  At his last visit we try to simplify his bronchodilator regimen, stop Symbicort and continued him on scheduled DuoNeb. He tells me that he had trouble with cough all through the Spring, was treated with prednisone on multiple occasions, was hospitalized at [facility] 08/2017. He was started on Breo qd - unsure if it helps him. He is using DuoNeb prn. He is using [employer]'s magic mouthwash, ? Thrush - he cannot tell me. He is tapering prednisone, currently on 10mg .   A CT scan 08/2017 while admitted, report reviewd >> no pulmonary embolism, left paramediastinal postradiation changes with volume loss and a chronic thick-walled left pleural effusion, unchanged soft tissue density in the right infrahilar lower lobe.  New small right pleural effusion.  Question mucous plugging, bronchial  filling of the right lower lobe airway.   Review of Systems As per HPI  Past Medical History:  Diagnosis Date  . Anxiety    takes wellbutrin daily  . Arthritis    knees  . COPD (chronic obstructive pulmonary disease) ([place])   . Diverticulosis   . Emphysema of lung ([place])   . GERD (gastroesophageal reflux disease)   . Hearing loss   . Hx of radiation therapy 09/29/11;10/01/11;10/03/11;10/06/11;10/08/11;   RLLlung,50Gy/54fx - SBRT  . Hyperlipidemia   . Hypertension   . Hypokinesia    MILD INFERIOR  . IHD (ischemic heart disease)    Remote MI in 2001 with stent to the LCX  . Lactose intolerance   . Non-small cell carcinoma of lung ([place])    Dx in October of 2010  . Normal nuclear stress test 2009   EF 63%. No ischemia. 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  . Pericardial effusion July 2011   s/p pericardial window; last echo in August 2012 with minimal effusion  . Pleural effusion, left   . SOB (shortness of breath)    Chronic with known fibrosis  . Status post chemotherapy 06/04/2009 -  09/06/2009    4 cycles of Cisplatin and Gemcitabine with Neulasta Support   . Vertigo    hx. of- ambulates with cane for safety.     Family History  Problem Relation Age of Onset  . Lung cancer Father   . Stroke Sister   . Heart disease Neg Hx   . Anesthesia  problems Neg Hx   . Lung disease Neg Hx      Social History   Socioeconomic History  . Marital status: Single    Spouse name: Not on file  . Number of children: Not on file  . Years of education: Not on file  . Highest education level: Not on file  Occupational History  . Occupation: retired    Fish farm manager: RETIRED    Comment: [employer]  Social Needs  . Financial resource strain: Not on file  . Food insecurity:    Worry: Not on file    Inability: Not on file  . Transportation needs:    Medical: Not on file    Non-medical: Not on file  Tobacco Use  . Smoking status: Former Smoker    Packs/day: 3.00    Years: 40.00    Pack years: 120.00     Types: Cigarettes    Last attempt to quit: 04/07/1994    Years since quitting: 23.4  . Smokeless tobacco: Never Used  Substance and Sexual Activity  . Alcohol use: No    Alcohol/week: 0.0 oz  . Drug use: No  . Sexual activity: Never  Lifestyle  . Physical activity:    Days per week: Not on file    Minutes per session: Not on file  . Stress: Not on file  Relationships  . Social connections:    Talks on phone: Not on file    Gets together: Not on file    Attends religious service: Not on file    Active member of club or organization: Not on file    Attends meetings of clubs or organizations: Not on file    Relationship status: Not on file  . Intimate partner violence:    Fear of current or ex partner: Not on file    Emotionally abused: Not on file    Physically abused: Not on file    Forced sexual activity: Not on file  Other Topics Concern  . Not on file  Social History Narrative   Originally from [place]. Previously worked for [employer]. Currently has 2 dogs. No bird or mold exposure.      Allergies  Allergen Reactions  . Sertraline Hcl     Patient does not remember the reaction     Outpatient Medications Prior to Visit  Medication Sig Dispense Refill  . albuterol (PROVENTIL HFA;VENTOLIN HFA) 108 (90 Base) MCG/ACT inhaler Inhale 2 puffs into the lungs every 6 (six) hours as needed for wheezing or shortness of breath. 1 Inhaler 2  . albuterol (PROVENTIL) (2.5 MG/3ML) 0.083% nebulizer solution Take 3 mLs (2.5 mg total) by nebulization every 4 (four) hours as needed for wheezing. 75 mL 12  . aspirin EC 81 MG tablet Take 81 mg by mouth daily.    Marland Kitchen atorvastatin (LIPITOR) 20 MG tablet TAKE 1 TABLET BY MOUTH DAILY (Patient taking differently: Take 20 mg by mouth once a day) 90 tablet 1  . buPROPion (WELLBUTRIN SR) 150 MG 12 hr tablet Take 150 mg by mouth daily.     . Cholecalciferol (VITAMIN D3) 2000 units TABS Take 2,000 Units by mouth 2 (two) times daily.     . fluticasone  furoate-vilanterol (BREO ELLIPTA) 200-25 MCG/INH AEPB Inhale 1 puff into the lungs daily.    . furosemide (LASIX) 40 MG tablet Take 1 tablet (40 mg total) by mouth daily. 90 tablet 0  . guaiFENesin (MUCINEX) 600 MG 12 hr tablet Take 1 tablet (600 mg total) by mouth  2 (two) times daily. 20 tablet 0  . ipratropium-albuterol (DUONEB) 0.5-2.5 (3) MG/3ML SOLN Take 3 mLs by nebulization every 6 (six) hours as needed.    . magic mouthwash SOLN Take 5 mLs by mouth 3 (three) times daily.    . metoprolol tartrate (LOPRESSOR) 25 MG tablet TAKE 1/2 TABLET BY MOUTH TWICE DAILY 90 tablet 3  . predniSONE (DELTASONE) 10 MG tablet Take 10 mg by mouth daily with breakfast.    . Tamsulosin HCl (FLOMAX) 0.4 MG CAPS Take 0.4 mg by mouth daily with supper.     . temazepam (RESTORIL) 30 MG capsule Take 30 mg by mouth at bedtime    . venlafaxine XR (EFFEXOR-XR) 150 MG 24 hr capsule Take 150 mg by mouth daily.   1   No facility-administered medications prior to visit.          Objective:   Physical Exam  Vitals:   09/14/17 0915  BP: 118/76  Pulse: (!) 106  SpO2: 97%  Weight: 189 lb (85.7 kg)  Height: 5\' 10"  (1.778 m)   Gen: Pleasant, elderly gentleman, in no distress,  normal affect  ENT: No lesions,  mouth clear,  oropharynx clear, no postnasal drip, hearing aids in place  Neck: No JVD, some coarse upper airway noise especially with a forced expiration  Lungs: No use of accessory muscles, he has referred upper airway noise, inspiratory squeaks on the right, some expiratory wheeze on forced expiration  Cardiovascular: RRR, heart sounds normal, no murmur or gallops, no peripheral edema  Musculoskeletal: No deformities, no cyanosis or clubbing  Neuro: alert, non focal  Skin: Warm, no lesions or rash     Assessment & Plan:  COPD (chronic obstructive pulmonary disease) with emphysema ([place]) He is currently on Breo which was started in the hospital.  He never really got on the DuoNeb's on a schedule  as we discussed.  He dealt with cough and dyspnea through the spring, likely exacerbated by rhinitis and increased secretions.  He has significant upper airway noise today.  He was on prednisone multiple times and was finally admitted in May 2019 to [facility].  He is currently on a prednisone taper.  He actually feels that Magic mouthwash has helped him the most, question thrush.  I do not have the [place] records available right now.  He is tried and failed Anoro in the past.  I will try him on Stiolto to see if he benefits more.  Follow-up in 1 month or next available to review his progress.  We will temporarily stop Breo.  We will try starting Stiolto 2 puffs once a day to see if you get more benefit.  If it is more helpful than we will order it through your pharmacy. Please keep your DuoNeb available to use up to every 6 hours if needed for shortness of breath, wheezing, chest tightness. Finish your prednisone taper as planned. Follow with Dr Lamonte Sakai in 1 month to discuss whether you have benefited from the new medication.   Baltazar Apo, MD, PhD 09/14/2017, 9:39 AM [facility] [phone] or if no answer [phone]

## 2017-09-14 NOTE — Progress Notes (Signed)
Patient seen in the office today and instructed on use of Stiolto Respimat.  Patient expressed understanding and demonstrated technique.

## 2017-09-14 NOTE — Assessment & Plan Note (Signed)
He is currently on Breo which was started in the hospital.  He never really got on the DuoNeb's on a schedule as we discussed.  He dealt with cough and dyspnea through the spring, likely exacerbated by rhinitis and increased secretions.  He has significant upper airway noise today.  He was on prednisone multiple times and was finally admitted in May 2019 to [facility].  He is currently on a prednisone taper.  He actually feels that Magic mouthwash has helped him the most, question thrush.  I do not have the [place] records available right now.  He is tried and failed Anoro in the past.  I will try him on Stiolto to see if he benefits more.  Follow-up in 1 month or next available to review his progress.  We will temporarily stop Breo.  We will try starting Stiolto 2 puffs once a day to see if you get more benefit.  If it is more helpful than we will order it through your pharmacy. Please keep your DuoNeb available to use up to every 6 hours if needed for shortness of breath, wheezing, chest tightness. Finish your prednisone taper as planned. Follow with Dr Lamonte Sakai in 1 month to discuss whether you have benefited from the new medication.

## 2017-09-14 NOTE — Patient Instructions (Addendum)
We will temporarily stop Breo.  We will try starting Stiolto 2 puffs once a day to see if you get more benefit.  If it is more helpful than we will order it through your pharmacy. Please keep your DuoNeb available to use up to every 6 hours if needed for shortness of breath, wheezing, chest tightness. Finish your prednisone taper as planned. Follow with Dr Lamonte Sakai in 1 month to discuss whether you have benefited from the new medication.

## 2017-09-17 DIAGNOSIS — Z7952 Long term (current) use of systemic steroids: Secondary | ICD-10-CM | POA: Diagnosis not present

## 2017-09-17 DIAGNOSIS — Z85118 Personal history of other malignant neoplasm of bronchus and lung: Secondary | ICD-10-CM | POA: Diagnosis not present

## 2017-09-17 DIAGNOSIS — Z792 Long term (current) use of antibiotics: Secondary | ICD-10-CM | POA: Diagnosis not present

## 2017-09-17 DIAGNOSIS — J441 Chronic obstructive pulmonary disease with (acute) exacerbation: Secondary | ICD-10-CM | POA: Diagnosis not present

## 2017-09-18 DIAGNOSIS — J441 Chronic obstructive pulmonary disease with (acute) exacerbation: Secondary | ICD-10-CM | POA: Diagnosis not present

## 2017-09-18 DIAGNOSIS — Z85118 Personal history of other malignant neoplasm of bronchus and lung: Secondary | ICD-10-CM | POA: Diagnosis not present

## 2017-09-18 DIAGNOSIS — Z792 Long term (current) use of antibiotics: Secondary | ICD-10-CM | POA: Diagnosis not present

## 2017-09-18 DIAGNOSIS — Z7952 Long term (current) use of systemic steroids: Secondary | ICD-10-CM | POA: Diagnosis not present

## 2017-09-22 DIAGNOSIS — Z7952 Long term (current) use of systemic steroids: Secondary | ICD-10-CM | POA: Diagnosis not present

## 2017-09-22 DIAGNOSIS — J441 Chronic obstructive pulmonary disease with (acute) exacerbation: Secondary | ICD-10-CM | POA: Diagnosis not present

## 2017-09-22 DIAGNOSIS — Z792 Long term (current) use of antibiotics: Secondary | ICD-10-CM | POA: Diagnosis not present

## 2017-09-22 DIAGNOSIS — Z85118 Personal history of other malignant neoplasm of bronchus and lung: Secondary | ICD-10-CM | POA: Diagnosis not present

## 2017-09-24 DIAGNOSIS — Z7952 Long term (current) use of systemic steroids: Secondary | ICD-10-CM | POA: Diagnosis not present

## 2017-09-24 DIAGNOSIS — Z792 Long term (current) use of antibiotics: Secondary | ICD-10-CM | POA: Diagnosis not present

## 2017-09-24 DIAGNOSIS — Z85118 Personal history of other malignant neoplasm of bronchus and lung: Secondary | ICD-10-CM | POA: Diagnosis not present

## 2017-09-24 DIAGNOSIS — J441 Chronic obstructive pulmonary disease with (acute) exacerbation: Secondary | ICD-10-CM | POA: Diagnosis not present

## 2017-09-29 DIAGNOSIS — Z792 Long term (current) use of antibiotics: Secondary | ICD-10-CM | POA: Diagnosis not present

## 2017-09-29 DIAGNOSIS — Z7952 Long term (current) use of systemic steroids: Secondary | ICD-10-CM | POA: Diagnosis not present

## 2017-09-29 DIAGNOSIS — Z85118 Personal history of other malignant neoplasm of bronchus and lung: Secondary | ICD-10-CM | POA: Diagnosis not present

## 2017-09-29 DIAGNOSIS — J441 Chronic obstructive pulmonary disease with (acute) exacerbation: Secondary | ICD-10-CM | POA: Diagnosis not present

## 2017-10-01 DIAGNOSIS — Z792 Long term (current) use of antibiotics: Secondary | ICD-10-CM | POA: Diagnosis not present

## 2017-10-01 DIAGNOSIS — J441 Chronic obstructive pulmonary disease with (acute) exacerbation: Secondary | ICD-10-CM | POA: Diagnosis not present

## 2017-10-01 DIAGNOSIS — Z85118 Personal history of other malignant neoplasm of bronchus and lung: Secondary | ICD-10-CM | POA: Diagnosis not present

## 2017-10-01 DIAGNOSIS — Z7952 Long term (current) use of systemic steroids: Secondary | ICD-10-CM | POA: Diagnosis not present

## 2017-10-06 ENCOUNTER — Other Ambulatory Visit: Payer: Self-pay

## 2017-10-06 ENCOUNTER — Inpatient Hospital Stay: Payer: Medicare Other | Attending: Hematology & Oncology

## 2017-10-06 VITALS — BP 122/80

## 2017-10-06 DIAGNOSIS — Z85820 Personal history of malignant melanoma of skin: Secondary | ICD-10-CM | POA: Diagnosis not present

## 2017-10-06 DIAGNOSIS — Z85118 Personal history of other malignant neoplasm of bronchus and lung: Secondary | ICD-10-CM | POA: Diagnosis not present

## 2017-10-06 DIAGNOSIS — Z452 Encounter for adjustment and management of vascular access device: Secondary | ICD-10-CM | POA: Diagnosis not present

## 2017-10-06 DIAGNOSIS — Z95828 Presence of other vascular implants and grafts: Secondary | ICD-10-CM

## 2017-10-06 MED ORDER — HEPARIN SOD (PORK) LOCK FLUSH 100 UNIT/ML IV SOLN
500.0000 [IU] | Freq: Once | INTRAVENOUS | Status: AC
  Administered 2017-10-06: 500 [IU] via INTRAVENOUS
  Filled 2017-10-06: qty 5

## 2017-10-06 MED ORDER — SODIUM CHLORIDE 0.9% FLUSH
10.0000 mL | INTRAVENOUS | Status: DC | PRN
  Administered 2017-10-06: 10 mL via INTRAVENOUS
  Filled 2017-10-06: qty 10

## 2017-10-06 NOTE — Patient Instructions (Signed)

## 2017-10-27 ENCOUNTER — Ambulatory Visit (INDEPENDENT_AMBULATORY_CARE_PROVIDER_SITE_OTHER): Payer: Medicare Other | Admitting: Emergency Medicine

## 2017-10-27 ENCOUNTER — Encounter: Payer: Self-pay | Admitting: Emergency Medicine

## 2017-10-27 VITALS — BP 128/80 | HR 74 | Ht 70.0 in | Wt 197.8 lb

## 2017-10-27 DIAGNOSIS — R061 Stridor: Secondary | ICD-10-CM | POA: Insufficient documentation

## 2017-10-27 DIAGNOSIS — I259 Chronic ischemic heart disease, unspecified: Secondary | ICD-10-CM | POA: Diagnosis not present

## 2017-10-27 DIAGNOSIS — C3490 Malignant neoplasm of unspecified part of unspecified bronchus or lung: Secondary | ICD-10-CM | POA: Diagnosis not present

## 2017-10-27 DIAGNOSIS — J438 Other emphysema: Secondary | ICD-10-CM | POA: Diagnosis not present

## 2017-10-27 DIAGNOSIS — R9389 Abnormal findings on diagnostic imaging of other specified body structures: Secondary | ICD-10-CM | POA: Diagnosis not present

## 2017-10-27 MED ORDER — TIOTROPIUM BROMIDE-OLODATEROL 2.5-2.5 MCG/ACT IN AERS: 2.0000 | INHALATION_SPRAY | Freq: Every day | RESPIRATORY_TRACT | 0 refills | Status: DC

## 2017-10-27 NOTE — Assessment & Plan Note (Signed)
He has stridor and cough which are some of his biggest complaints.  He is not treated for allergic rhinitis we will start this with loratadine.  Continue Mucinex.

## 2017-10-27 NOTE — Progress Notes (Signed)
Subjective:    Patient ID: [name] Fields, male    DOB: 02/17/1935, 83 y.o.   MRN: [id number]  HPI 83 year old gentleman previously followed here by Dr. Ashok Cordia.  He has a significant tobacco history (120 pack years) with associated COPD.  A-1 AT MS genotype. He also has a history of non-small cell lung cancer, separate primaries one in the right lower lobe and one in the left lung, treated with chemoradiation.  Most recently treated for a stage I in 10/2011.  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  He has had both Pneumovax and Prevnar. He complains of exertion SOB, can happen with going from sitting to standing. He doesn't walk very much now - decreased about 2 years ago. Last 6 minutes walk was 01/2017 > no desaturation. He wheezes with exertion and most mornings when he gets up. He is using DuoNeb qid on a schedule.  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  At his last visit we try to simplify his bronchodilator regimen, stop Symbicort and continued him on scheduled DuoNeb. He tells me that he had trouble with cough all through the Spring, was treated with prednisone on multiple occasions, was hospitalized at [facility] 08/2017. He was started on Breo qd - unsure if it helps him. He is using DuoNeb prn. He is using [facility]'s magic mouthwash, ? Thrush - he cannot tell me. He is tapering prednisone, currently on 10mg .   A CT scan 08/2017 while admitted, report reviewd >> no pulmonary embolism, left paramediastinal postradiation changes with volume loss and a chronic thick-walled left pleural effusion, unchanged soft tissue density in the right infrahilar lower lobe.  New small right pleural effusion.  Question mucous plugging, bronchial  filling of the right lower lobe airway.  ROV 10/27/17 --83 year old man with multifactorial dyspnea.  He has a history of heavy tobacco, associated COPD.  Also non-small cell lung cancers of the right lower lobe and the left lung that were treated with chemoradiation.  He has had some post radiation interstitial disease on the left.  He has a chronic thick-walled left pleural effusion.  He deals with continued dyspnea, cough.  He is tried multiple bronchodilators without any significant benefit.  I tried him on Stiolto at our last visit. He is having a lot of hoarse voice, sore throat. He is not sure that the [place] has helped his breathing any. He had a bronchoscopy at [facility] 08/25/17 that did not show an endobronchial lesion. He has a lot of stridor and throat irritation. Denies any overt GERD. He does have minimal nasal congestion.    Review of Systems As per HPI  Past Medical History:  Diagnosis Date  . Anxiety    takes wellbutrin daily  . Arthritis    knees  . COPD (chronic obstructive pulmonary disease) ([place])   . Diverticulosis   . Emphysema of lung ([place])   . GERD (gastroesophageal reflux disease)   . Hearing loss   . Hx of radiation therapy 09/29/11;10/01/11;10/03/11;10/06/11;10/08/11;   RLLlung,50Gy/15fx - SBRT  . Hyperlipidemia   . Hypertension   . Hypokinesia    MILD INFERIOR  . IHD (ischemic heart disease)    Remote MI in 2001 with stent to the LCX  .  Lactose intolerance   . Non-small cell carcinoma of lung ([place])    Dx in October of 2010  . Normal nuclear stress test 2009   EF 63%. No ischemia. 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  . Pericardial effusion July 2011   s/p pericardial window; last echo in August 2012 with minimal effusion  . Pleural effusion, left   . SOB (shortness of breath)    Chronic with known fibrosis  . Status post chemotherapy 06/04/2009 -  09/06/2009    4 cycles of Cisplatin and Gemcitabine with Neulasta Support   . Vertigo    hx. of- ambulates with cane for safety.       Family History  Problem Relation Age of Onset  . Lung cancer Father   . Stroke Sister   . Heart disease Neg Hx   . Anesthesia problems Neg Hx   . Lung disease Neg Hx      Social History   Socioeconomic History  . Marital status: Single    Spouse name: Not on file  . Number of children: Not on file  . Years of education: Not on file  . Highest education level: Not on file  Occupational History  . Occupation: retired    Fish farm manager: RETIRED    Comment: [employer]  Social Needs  . Financial resource strain: Not on file  . Food insecurity:    Worry: Not on file    Inability: Not on file  . Transportation needs:    Medical: Not on file    Non-medical: Not on file  Tobacco Use  . Smoking status: Former Smoker    Packs/day: 3.00    Years: 40.00    Pack years: 120.00    Types: Cigarettes    Last attempt to quit: 04/07/1994    Years since quitting: 23.5  . Smokeless tobacco: Never Used  Substance and Sexual Activity  . Alcohol use: No    Alcohol/week: 0.0 oz  . Drug use: No  . Sexual activity: Never  Lifestyle  . Physical activity:    Days per week: Not on file    Minutes per session: Not on file  . Stress: Not on file  Relationships  . Social connections:    Talks on phone: Not on file    Gets together: Not on file    Attends religious service: Not on file    Active member of club or organization: Not on file    Attends meetings of clubs or organizations: Not on file    Relationship status: Not on file  . Intimate partner violence:    Fear of current or ex partner: Not on file    Emotionally abused: Not on file    Physically abused: Not on file    Forced sexual activity: Not on file  Other Topics Concern  . Not on file  Social History Narrative   Originally from [place]. Previously worked for [employer]. Currently has 2 dogs. No bird or mold exposure.      Allergies  Allergen Reactions  . Sertraline Hcl     Patient does not remember the reaction     Outpatient  Medications Prior to Visit  Medication Sig Dispense Refill  . albuterol (PROVENTIL HFA;VENTOLIN HFA) 108 (90 Base) MCG/ACT inhaler Inhale 2 puffs into the lungs every 6 (six) hours as needed for wheezing or shortness of breath. 1 Inhaler 2  . albuterol (PROVENTIL) (2.5 MG/3ML) 0.083% nebulizer solution Take 3 mLs (2.5 mg total) by nebulization every 4 (four)  hours as needed for wheezing. 75 mL 12  . aspirin EC 81 MG tablet Take 81 mg by mouth daily.    Marland Kitchen atorvastatin (LIPITOR) 20 MG tablet TAKE 1 TABLET BY MOUTH DAILY (Patient taking differently: Take 20 mg by mouth once a day) 90 tablet 1  . buPROPion (WELLBUTRIN SR) 150 MG 12 hr tablet Take 150 mg by mouth daily.     . Cholecalciferol (VITAMIN D3) 2000 units TABS Take 2,000 Units by mouth 2 (two) times daily.     . furosemide (LASIX) 40 MG tablet Take 1 tablet (40 mg total) by mouth daily. 90 tablet 0  . guaiFENesin (MUCINEX) 600 MG 12 hr tablet Take 1 tablet (600 mg total) by mouth 2 (two) times daily. 20 tablet 0  . ipratropium-albuterol (DUONEB) 0.5-2.5 (3) MG/3ML SOLN Take 3 mLs by nebulization every 6 (six) hours as needed.    . magic mouthwash SOLN Take 5 mLs by mouth 3 (three) times daily.    . metoprolol tartrate (LOPRESSOR) 25 MG tablet TAKE 1/2 TABLET BY MOUTH TWICE DAILY 90 tablet 3  . predniSONE (DELTASONE) 10 MG tablet Take 10 mg by mouth daily with breakfast.    . Tamsulosin HCl (FLOMAX) 0.4 MG CAPS Take 0.4 mg by mouth daily with supper.     . temazepam (RESTORIL) 30 MG capsule Take 30 mg by mouth at bedtime    . Tiotropium Bromide-Olodaterol (STIOLTO RESPIMAT) 2.5-2.5 MCG/ACT AERS Inhale 2 puffs into the lungs daily. 2 Inhaler 0  . venlafaxine XR (EFFEXOR-XR) 150 MG 24 hr capsule Take 150 mg by mouth daily.   1  . fluticasone furoate-vilanterol (BREO ELLIPTA) 200-25 MCG/INH AEPB Inhale 1 puff into the lungs daily.     No facility-administered medications prior to visit.          Objective:   Physical Exam  Vitals:    10/27/17 1150  BP: 128/80  Pulse: 74  SpO2: 96%  Weight: 197 lb 12.8 oz (89.7 kg)  Height: 5\' 10"  (1.778 m)   Gen: Pleasant, elderly gentleman, in no distress,  normal affect  ENT: No lesions,  mouth clear,  oropharynx clear, no postnasal drip, hearing aids in place, hoarse voice  Neck: No JVD, some coarse upper airway noise especially with a forced expiration  Lungs: No use of accessory muscles, he has referred upper airway noise, bilateral rhonchi  Cardiovascular: RRR, heart sounds normal, no murmur or gallops, no peripheral edema  Musculoskeletal: No deformities, no cyanosis or clubbing  Neuro: alert, non focal  Skin: Warm, no lesions or rash     Assessment & Plan:  COPD (chronic obstructive pulmonary disease) with emphysema ([place]) He has a difficult time telling me whether he really benefited from the [facility] but when I asked him if he wants to continue it he says absolutely yes.  We will continue it.  He will use albuterol up to every 4 hours as needed.  Some of his symptoms are upper airway in nature and we will try to address these as well.  Abnormal CT of the chest Okay to CT scan of the chest in the setting of previously treated non-small cell lung cancer, separate primaries.  He received radiation and had some radiation pneumonitis.  He is also reportedly got a loculated left pleural effusion based on a CT scan from May in the [facility] system.  A bronchoscopy on 08/25/2017 did not show an endobronchial lesion at least as best I can interpret the notes that are  available.  I think he needs a repeat scan in August to look for interval change and then we can assess whether any interventions are necessary.  Allergic rhinitis He has stridor and cough which are some of his biggest complaints.  He is not treated for allergic rhinitis we will start this with loratadine.  Continue Mucinex.  Stridor Question whether he may need an upper airway exam although he did have  bronchoscopy at [facility] recently.  He has stridor which appears to be his biggest complaint and chronic cough that contributes to his dyspnea.  We will try to treat allergic rhinitis, progress the work-up if the symptoms continue.  Baltazar Apo, MD, PhD 10/27/2017, 12:28 PM [facility] [phone] or if no answer [phone]

## 2017-10-27 NOTE — Assessment & Plan Note (Signed)
He has a difficult time telling me whether he really benefited from the [facility] but when I asked him if he wants to continue it he says absolutely yes.  We will continue it.  He will use albuterol up to every 4 hours as needed.  Some of his symptoms are upper airway in nature and we will try to address these as well.

## 2017-10-27 NOTE — Assessment & Plan Note (Signed)
Question whether he may need an upper airway exam although he did have bronchoscopy at [facility] recently.  He has stridor which appears to be his biggest complaint and chronic cough that contributes to his dyspnea.  We will try to treat allergic rhinitis, progress the work-up if the symptoms continue.

## 2017-10-27 NOTE — Assessment & Plan Note (Addendum)
Complicated CT scan of the chest in the setting of previously treated non-small cell lung cancer, separate primaries.  He received radiation and had some radiation pneumonitis on the L.  He has also reportedly got a loculated left pleural effusion based on a CT scan from May in the [facility] system.  A bronchoscopy on 08/25/2017 did not show an endobronchial lesion at least as best I can interpret the notes that are available.  I think he needs a repeat scan in August to look for interval change and then we can assess whether any interventions are necessary.

## 2017-10-27 NOTE — Patient Instructions (Addendum)
We will plan to continue Stiolto 2 puffs once daily.  A prescription will be written and sent to your pharmacy. Keep albuterol available to use up to every 4 hours as needed for shortness of breath, wheezing, chest tightness. Continue Mucinex twice a day as you have been taking it Start loratadine 10 mg once daily until next visit. We will repeat your CT scan of the chest in late August to compare with your prior scan from May. Follow with Dr. Lamonte Sakai in August after your scan to review the results together.

## 2017-10-29 DIAGNOSIS — G479 Sleep disorder, unspecified: Secondary | ICD-10-CM | POA: Diagnosis not present

## 2017-10-29 DIAGNOSIS — K1379 Other lesions of oral mucosa: Secondary | ICD-10-CM | POA: Diagnosis not present

## 2017-10-29 DIAGNOSIS — J441 Chronic obstructive pulmonary disease with (acute) exacerbation: Secondary | ICD-10-CM | POA: Diagnosis not present

## 2017-11-02 ENCOUNTER — Other Ambulatory Visit: Payer: Self-pay | Admitting: Dermatology

## 2017-11-02 DIAGNOSIS — D485 Neoplasm of uncertain behavior of skin: Secondary | ICD-10-CM | POA: Diagnosis not present

## 2017-11-02 DIAGNOSIS — L570 Actinic keratosis: Secondary | ICD-10-CM | POA: Diagnosis not present

## 2017-11-02 DIAGNOSIS — D0462 Carcinoma in situ of skin of left upper limb, including shoulder: Secondary | ICD-10-CM | POA: Diagnosis not present

## 2017-11-02 DIAGNOSIS — C44629 Squamous cell carcinoma of skin of left upper limb, including shoulder: Secondary | ICD-10-CM | POA: Diagnosis not present

## 2017-11-02 DIAGNOSIS — D0421 Carcinoma in situ of skin of right ear and external auricular canal: Secondary | ICD-10-CM | POA: Diagnosis not present

## 2017-11-23 DIAGNOSIS — J449 Chronic obstructive pulmonary disease, unspecified: Secondary | ICD-10-CM | POA: Diagnosis not present

## 2017-11-23 DIAGNOSIS — R5382 Chronic fatigue, unspecified: Secondary | ICD-10-CM | POA: Diagnosis not present

## 2017-11-23 DIAGNOSIS — I2510 Atherosclerotic heart disease of native coronary artery without angina pectoris: Secondary | ICD-10-CM | POA: Diagnosis not present

## 2017-11-23 DIAGNOSIS — F322 Major depressive disorder, single episode, severe without psychotic features: Secondary | ICD-10-CM | POA: Diagnosis not present

## 2017-11-23 DIAGNOSIS — Z85118 Personal history of other malignant neoplasm of bronchus and lung: Secondary | ICD-10-CM | POA: Diagnosis not present

## 2017-11-23 DIAGNOSIS — I5030 Unspecified diastolic (congestive) heart failure: Secondary | ICD-10-CM | POA: Diagnosis not present

## 2017-11-24 ENCOUNTER — Ambulatory Visit (HOSPITAL_BASED_OUTPATIENT_CLINIC_OR_DEPARTMENT_OTHER): Payer: Medicare Other

## 2017-11-24 DIAGNOSIS — Z85118 Personal history of other malignant neoplasm of bronchus and lung: Secondary | ICD-10-CM | POA: Diagnosis not present

## 2017-11-24 DIAGNOSIS — N400 Enlarged prostate without lower urinary tract symptoms: Secondary | ICD-10-CM | POA: Diagnosis not present

## 2017-11-24 DIAGNOSIS — I10 Essential (primary) hypertension: Secondary | ICD-10-CM | POA: Diagnosis not present

## 2017-11-24 DIAGNOSIS — I5030 Unspecified diastolic (congestive) heart failure: Secondary | ICD-10-CM | POA: Diagnosis not present

## 2017-11-24 DIAGNOSIS — E782 Mixed hyperlipidemia: Secondary | ICD-10-CM | POA: Diagnosis not present

## 2017-11-24 DIAGNOSIS — F329 Major depressive disorder, single episode, unspecified: Secondary | ICD-10-CM | POA: Diagnosis not present

## 2017-11-24 DIAGNOSIS — I2510 Atherosclerotic heart disease of native coronary artery without angina pectoris: Secondary | ICD-10-CM | POA: Diagnosis not present

## 2017-11-24 DIAGNOSIS — C3430 Malignant neoplasm of lower lobe, unspecified bronchus or lung: Secondary | ICD-10-CM | POA: Diagnosis not present

## 2017-11-24 DIAGNOSIS — N401 Enlarged prostate with lower urinary tract symptoms: Secondary | ICD-10-CM | POA: Diagnosis not present

## 2017-11-24 DIAGNOSIS — J441 Chronic obstructive pulmonary disease with (acute) exacerbation: Secondary | ICD-10-CM | POA: Diagnosis not present

## 2017-11-24 DIAGNOSIS — F322 Major depressive disorder, single episode, severe without psychotic features: Secondary | ICD-10-CM | POA: Diagnosis not present

## 2017-11-24 DIAGNOSIS — M179 Osteoarthritis of knee, unspecified: Secondary | ICD-10-CM | POA: Diagnosis not present

## 2017-11-27 ENCOUNTER — Ambulatory Visit: Payer: Medicare Other | Admitting: Emergency Medicine

## 2017-12-15 ENCOUNTER — Inpatient Hospital Stay: Payer: Medicare Other

## 2017-12-15 ENCOUNTER — Other Ambulatory Visit: Payer: Self-pay

## 2017-12-15 ENCOUNTER — Inpatient Hospital Stay: Payer: Medicare Other | Attending: Hematology & Oncology | Admitting: Family

## 2017-12-15 VITALS — BP 101/58 | HR 62 | Temp 98.0°F | Resp 20 | Wt 191.0 lb

## 2017-12-15 DIAGNOSIS — Z9221 Personal history of antineoplastic chemotherapy: Secondary | ICD-10-CM

## 2017-12-15 DIAGNOSIS — Z85118 Personal history of other malignant neoplasm of bronchus and lung: Secondary | ICD-10-CM | POA: Insufficient documentation

## 2017-12-15 DIAGNOSIS — C3431 Malignant neoplasm of lower lobe, right bronchus or lung: Secondary | ICD-10-CM

## 2017-12-15 DIAGNOSIS — D0330 Melanoma in situ of unspecified part of face: Secondary | ICD-10-CM

## 2017-12-15 DIAGNOSIS — Z85820 Personal history of malignant melanoma of skin: Secondary | ICD-10-CM | POA: Diagnosis not present

## 2017-12-15 DIAGNOSIS — Z923 Personal history of irradiation: Secondary | ICD-10-CM | POA: Diagnosis not present

## 2017-12-15 DIAGNOSIS — C3492 Malignant neoplasm of unspecified part of left bronchus or lung: Secondary | ICD-10-CM

## 2017-12-15 DIAGNOSIS — Z95828 Presence of other vascular implants and grafts: Secondary | ICD-10-CM

## 2017-12-15 LAB — CBC WITH DIFFERENTIAL (CANCER CENTER ONLY)
BASOS ABS: 0 10*3/uL (ref 0.0–0.1)
BASOS PCT: 1 %
EOS ABS: 0.1 10*3/uL (ref 0.0–0.5)
EOS PCT: 2 %
HCT: 42.2 % (ref 38.7–49.9)
Hemoglobin: 13.5 g/dL (ref 13.0–17.1)
Lymphocytes Relative: 21 %
Lymphs Abs: 1.5 10*3/uL (ref 0.9–3.3)
MCH: 30.6 pg (ref 28.0–33.4)
MCHC: 32 g/dL (ref 32.0–35.9)
MCV: 95.7 fL (ref 82.0–98.0)
Monocytes Absolute: 0.8 10*3/uL (ref 0.1–0.9)
Monocytes Relative: 11 %
Neutro Abs: 4.8 10*3/uL (ref 1.5–6.5)
Neutrophils Relative %: 65 %
PLATELETS: 239 10*3/uL (ref 145–400)
RBC: 4.41 MIL/uL (ref 4.20–5.70)
RDW: 13.5 % (ref 11.1–15.7)
WBC: 7.3 10*3/uL (ref 4.0–10.0)

## 2017-12-15 LAB — CMP (CANCER CENTER ONLY)
ALBUMIN: 3.4 g/dL — AB (ref 3.5–5.0)
ALT: 24 U/L (ref 10–47)
ANION GAP: 4 — AB (ref 5–15)
AST: 28 U/L (ref 11–38)
Alkaline Phosphatase: 114 U/L — ABNORMAL HIGH (ref 26–84)
BUN: 20 mg/dL (ref 7–22)
CO2: 30 mmol/L (ref 18–33)
CREATININE: 1.3 mg/dL — AB (ref 0.60–1.20)
Calcium: 9 mg/dL (ref 8.0–10.3)
Chloride: 107 mmol/L (ref 98–108)
GLUCOSE: 87 mg/dL (ref 73–118)
Potassium: 4.2 mmol/L (ref 3.3–4.7)
SODIUM: 141 mmol/L (ref 128–145)
Total Bilirubin: 0.7 mg/dL (ref 0.2–1.6)
Total Protein: 6.7 g/dL (ref 6.4–8.1)

## 2017-12-15 MED ORDER — SODIUM CHLORIDE 0.9% FLUSH
10.0000 mL | INTRAVENOUS | Status: DC | PRN
  Administered 2017-12-15: 10 mL via INTRAVENOUS
  Filled 2017-12-15: qty 10

## 2017-12-15 MED ORDER — HEPARIN SOD (PORK) LOCK FLUSH 100 UNIT/ML IV SOLN
500.0000 [IU] | Freq: Once | INTRAVENOUS | Status: AC
  Administered 2017-12-15: 500 [IU] via INTRAVENOUS
  Filled 2017-12-15: qty 5

## 2017-12-15 NOTE — Progress Notes (Signed)
Hematology and Oncology Follow Up Visit  [name] Fields [id number] 05/06/1934 83 y.o. 12/15/2017   Principle Diagnosis:  1. 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 ITP   Current Therapy:   Observaion   Interim History:  [name] Fields is here today for follow-up. He states that he recently had an upper respiratory infection treated with steroids and an antibiotic. He is feeling better but still has an intermittent cough with brown sputum at times.  His SOB is stable and unchanged from his baseline since surgery.  No fever, chills, n/v, rash, dizziness, chest pain, palpitations, abdominal pain or changes in bowel or bladder habits.  He has some constipation and will try Miralax twice a day and see if this helps.  In July, he had 2 squamous cell carcinoma lesions removed, 1 from his left hand and another from his right ear. He states that he goes back to see his dermatologist this week on Thursday to have some other spots checked out.  He is on aspirin daily and with thin sin does bruise easily. He has had several falls where he just stumbled and fell. His knees are skinned up but he states that he was not seriously injured.  He sees his PCP next week to discuss difficulty emptying his bladder. He does not feel that Flomax is helping.  No swelling, tenderness, numbness or tingling in his extremities. No c/o pain.  No lymphadenopathy noted on exam.  He has a good appetite and is staying well hydrated. His weight is stable.   ECOG Performance Status: 1 - Symptomatic but completely ambulatory  Medications:  Allergies as of 12/15/2017      Reactions   Sertraline Hcl    Patient does not remember the reaction      Medication List        Accurate as of 12/15/17 12:14 PM. Always use your most recent med list.          albuterol (2.5 MG/3ML)  0.083% nebulizer solution Commonly known as:  PROVENTIL Take 3 mLs (2.5 mg total) by nebulization every 4 (four) hours as needed for wheezing.   albuterol 108 (90 Base) MCG/ACT inhaler Commonly known as:  PROVENTIL HFA;VENTOLIN HFA Inhale 2 puffs into the lungs every 6 (six) hours as needed for wheezing or shortness of breath.   aspirin EC 81 MG tablet Take 81 mg by mouth daily.   atorvastatin 20 MG tablet Commonly known as:  LIPITOR TAKE 1 TABLET BY MOUTH DAILY   buPROPion 150 MG 12 hr tablet Commonly known as:  WELLBUTRIN SR Take 150 mg by mouth daily.   furosemide 40 MG tablet Commonly known as:  LASIX Take 1 tablet (40 mg total) by mouth daily.   guaiFENesin 600 MG 12 hr tablet Commonly known as:  MUCINEX Take 1 tablet (600 mg total) by mouth 2 (two) times daily.   ipratropium-albuterol 0.5-2.5 (3) MG/3ML Soln Commonly known as:  DUONEB Take 3 mLs by nebulization every 6 (six) hours as needed.   LINZESS 290 MCG Caps capsule Generic drug:  linaclotide Take 290 mcg by mouth 2 (two) times a week.   magic mouthwash Soln Take 5 mLs by mouth 3 (three) times daily.   metoprolol tartrate 25 MG tablet Commonly known as:  LOPRESSOR TAKE 1/2 TABLET BY MOUTH TWICE DAILY   predniSONE 10 MG tablet  Commonly known as:  DELTASONE Take 10 mg by mouth daily with breakfast.   tamsulosin 0.4 MG Caps capsule Commonly known as:  FLOMAX Take 0.4 mg by mouth daily with supper.   temazepam 30 MG capsule Commonly known as:  RESTORIL Take 30 mg by mouth at bedtime   Tiotropium Bromide-Olodaterol 2.5-2.5 MCG/ACT Aers Inhale 2 puffs into the lungs daily.   venlafaxine XR 150 MG 24 hr capsule Commonly known as:  EFFEXOR-XR Take 150 mg by mouth daily.   Vitamin D3 2000 units Tabs Take 2,000 Units by mouth 2 (two) times daily.       Allergies:  Allergies  Allergen Reactions  . Sertraline Hcl     Patient does not remember the reaction    Past Medical History, Surgical  history, Social history, and Family History were reviewed and updated.  Review of Systems: All other 10 point review of systems is negative.   Physical Exam:  weight is 191 lb (86.6 kg). His oral temperature is 98 F (36.7 C). His blood pressure is 101/58 (abnormal) and his pulse is 62. His respiration is 20 and oxygen saturation is 100%.   Wt Readings from Last 3 Encounters:  12/15/17 191 lb (86.6 kg)  10/27/17 197 lb 12.8 oz (89.7 kg)  09/14/17 189 lb (85.7 kg)    Ocular: Sclerae unicteric, pupils equal, round and reactive to light Ear-nose-throat: Oropharynx clear, dentition fair Lymphatic: No cervical, supraclavicular or axillary adenopathy Lungs no rales or rhonchi, good excursion bilaterally Heart regular rate and rhythm, no murmur appreciated Abd soft, nontender, positive bowel sounds, no liver or spleen tip palpated on exam, no fluid wave  MSK no focal spinal tenderness, no joint edema Neuro: non-focal, well-oriented, appropriate affect Breasts: Deferred   Lab Results  Component Value Date   WBC 7.3 12/15/2017   HGB 13.5 12/15/2017   HCT 42.2 12/15/2017   MCV 95.7 12/15/2017   PLT 239 12/15/2017   Lab Results  Component Value Date   FERRITIN 92 12/20/2014   IRON 95 12/20/2014   TIBC 405 12/20/2014   UIBC 310 12/20/2014   IRONPCTSAT 23 12/20/2014   Lab Results  Component Value Date   RETICCTPCT 1.8 12/20/2014   RBC 4.41 12/15/2017   Lab Results  Component Value Date   KAPLAMBRATIO 1.98 (L) 12/20/2014   No results found for: Kandis Cocking, IGMSERUM Lab Results  Component Value Date   TOTALPROTELP 6.9 12/20/2014   ALBUMINELP 3.4 12/20/2014   A1GS 0.3 12/20/2014   A2GS 0.9 12/20/2014   BETS 1.3 12/20/2014   GAMS 1.1 12/20/2014   MSPIKE Not Observed 12/20/2014   SPEI Comment 12/20/2014     Chemistry      Component Value Date/Time   NA 141 12/15/2017 1020   NA 142 07/29/2017 1153   NA 144 02/02/2017 0842   NA 140 12/01/2016 0957   K 4.2  12/15/2017 1020   K 3.8 02/02/2017 0842   K 4.5 12/01/2016 0957   CL 107 12/15/2017 1020   CL 105 02/02/2017 0842   CL 104 08/27/2012 1421   CO2 30 12/15/2017 1020   CO2 29 02/02/2017 0842   CO2 24 12/01/2016 0957   BUN 20 12/15/2017 1020   BUN 22 07/29/2017 1153   BUN 13 02/02/2017 0842   BUN 21.2 12/01/2016 0957   CREATININE 1.30 (H) 12/15/2017 1020   CREATININE 1.1 02/02/2017 0842   CREATININE 1.1 12/01/2016 0957      Component Value Date/Time   CALCIUM 9.0  12/15/2017 1020   CALCIUM 9.5 02/02/2017 0842   CALCIUM 10.2 12/01/2016 0957   ALKPHOS 114 (H) 12/15/2017 1020   ALKPHOS 104 (H) 02/02/2017 0842   ALKPHOS 111 12/01/2016 0957   AST 28 12/15/2017 1020   AST 30 12/01/2016 0957   ALT 24 12/15/2017 1020   ALT 28 02/02/2017 0842   ALT 24 12/01/2016 0957   BILITOT 0.7 12/15/2017 1020   BILITOT 0.92 12/01/2016 0957      Impression and Plan: Mr. Salay is a very pleasant 83 yo caucasian gentleman with istory of both right and left lung cancers as well as melanoma of the left cheek. His most recent malignancy was the right lung cancer treated with stereotactic radiosurgery in July 2013. So far, there has been no evidence of recurrence.  He is still doing well and is followed closely by his dermatologist. Platelet count is stable at 239, Hgb 13.5 and WBC 7.3.  We will plan to see him back in another 6 months for follow-up.  He will contact our office with any questions or concerns. We can certainly see him sooner if need be.   Laverna Peace, NP 9/10/201912:14 PM

## 2017-12-17 DIAGNOSIS — D0421 Carcinoma in situ of skin of right ear and external auricular canal: Secondary | ICD-10-CM | POA: Diagnosis not present

## 2017-12-23 DIAGNOSIS — I2510 Atherosclerotic heart disease of native coronary artery without angina pectoris: Secondary | ICD-10-CM | POA: Diagnosis not present

## 2017-12-23 DIAGNOSIS — J441 Chronic obstructive pulmonary disease with (acute) exacerbation: Secondary | ICD-10-CM | POA: Diagnosis not present

## 2017-12-23 DIAGNOSIS — C3430 Malignant neoplasm of lower lobe, unspecified bronchus or lung: Secondary | ICD-10-CM | POA: Diagnosis not present

## 2017-12-23 DIAGNOSIS — Z23 Encounter for immunization: Secondary | ICD-10-CM | POA: Diagnosis not present

## 2017-12-23 DIAGNOSIS — N390 Urinary tract infection, site not specified: Secondary | ICD-10-CM | POA: Diagnosis not present

## 2017-12-23 DIAGNOSIS — N401 Enlarged prostate with lower urinary tract symptoms: Secondary | ICD-10-CM | POA: Diagnosis not present

## 2017-12-23 DIAGNOSIS — I5030 Unspecified diastolic (congestive) heart failure: Secondary | ICD-10-CM | POA: Diagnosis not present

## 2017-12-31 ENCOUNTER — Encounter: Payer: Self-pay | Admitting: Nurse Practitioner

## 2018-01-04 DIAGNOSIS — N390 Urinary tract infection, site not specified: Secondary | ICD-10-CM | POA: Diagnosis not present

## 2018-01-19 ENCOUNTER — Ambulatory Visit: Payer: Medicare Other | Admitting: Nurse Practitioner

## 2018-01-19 ENCOUNTER — Telehealth: Payer: Self-pay | Admitting: *Deleted

## 2018-01-19 NOTE — Progress Notes (Deleted)
CARDIOLOGY OFFICE NOTE  Date:  01/19/2018    [name] Date of Birth: 1934-05-12 Medical Record #[id number]  PCP:  [name], MD  Cardiologist:  Servando Snare & ***    No chief complaint on file.   History of Present Illness: KENLEE MALER is a 83 y.o. male who presents today for a *** Seen for Dr. Marlou Porch. He is a former patient of Dr. Susa Simmonds that I cared for for many years.   He has a history of coronary artery disease with prior lateral wall MI status post multiple stents in the past. Other issues include lung cancer in both sides (treated with radiation), melanoma of the left cheeck, fibrosis, recurrent pleural effusion. He has had a pericardial window for an effusion back in 2011.   I saw him back in July of 2017- he had been referred back for shortness of breath. Productive cough - COPD - I sent him back to pulmonary for evaluation/treatment - seemed more pulmonary related/radiation effects. Did not understand how to use his Spiriva. Some swelling - getting too much salt. I added some low dose Lasix. He was better on return but has remained very limited by his breathing. Last seen in September.   Comes in today. Here alone. Now using a cane due to falls - he says this is from his vertigo. Has probably had 10 to 12 falls since I last saw him - fortunately he has not gotten hurt. No chest pain. Does have considerable shortness of breath - sometimes worse when he lies down. He is not active. Now seeing Dr. Lamonte Sakai for his emphysema. Continues with oncology follow up as well. Planning on going to the walk in clinic later today for ?trigger finger. Tolerating his medicines. Labs from earlier this year noted on the [facility].    Comes in today. Here with   Past Medical History:  Diagnosis Date  . Anxiety    takes wellbutrin daily  . Arthritis    knees  . COPD (chronic obstructive pulmonary disease) ([place])   . Diverticulosis   . Emphysema of lung ([place])   . GERD  (gastroesophageal reflux disease)   . Hearing loss   . Hx of radiation therapy 09/29/11;10/01/11;10/03/11;10/06/11;10/08/11;   RLLlung,50Gy/73fx - SBRT  . Hyperlipidemia   . Hypertension   . Hypokinesia    MILD INFERIOR  . IHD (ischemic heart disease)    Remote MI in 2001 with stent to the LCX  . Lactose intolerance   . Non-small cell carcinoma of lung ([place])    Dx in October of 2010  . Normal nuclear stress test 2009   EF 63%. No ischemia. 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  . Pericardial effusion July 2011   s/p pericardial window; last echo in August 2012 with minimal effusion  . Pleural effusion, left   . SOB (shortness of breath)    Chronic with known fibrosis  . Status post chemotherapy 06/04/2009 -  09/06/2009    4 cycles of Cisplatin and Gemcitabine with Neulasta Support   . Vertigo    hx. of- ambulates with cane for safety.    Past Surgical History:  Procedure Laterality Date  . CARDIAC CATHETERIZATION  07/11/2003   EF 60%, REPEAT, WHICH SHOWED 30% PROXIMAL NARROWING IN THE RIGHT  CORONARY ARTERY  . CARDIOVASCULAR STRESS TEST  05/2007   EF 63%  . CATARACT EXTRACTION, BILATERAL Bilateral   . COLONOSCOPY WITH PROPOFOL N/A 01/30/2015   Procedure: COLONOSCOPY WITH PROPOFOL;  Surgeon: Hassell Done  Sandria Senter, MD;  Location: [facility];  Service: Endoscopy;  Laterality: N/A;  . CORONARY STENT PLACEMENT  07/1999   STENT TO THE LEFT CIRCUMFLEX  . HERNIA REPAIR     as an adult- R inguinal   . PERICARDIAL WINDOW  July 2011  . TRANSTHORACIC ECHOCARDIOGRAM  02/22/2010   EF 55-60%     Medications: No outpatient medications have been marked as taking for the 01/19/18 encounter (Appointment) with Burtis Junes, NP.     Allergies: Allergies  Allergen Reactions  . Sertraline Hcl     Patient does not remember the reaction    Social History: The patient  reports that he quit smoking about 23 years ago. His smoking use included cigarettes. He has a 120.00 pack-year smoking history. He has never  used smokeless tobacco. He reports that he does not drink alcohol or use drugs.   Family History: The patient's ***family history includes Lung cancer in his father; Stroke in his sister.   Review of Systems: Please see the history of present illness.   Otherwise, the review of systems is positive for {NONE DEFAULTED:18576::"none"}.   All other systems are reviewed and negative.   Physical Exam: VS:  There were no vitals taken for this visit. Marland Kitchen  BMI There is no height or weight on file to calculate BMI.  Wt Readings from Last 3 Encounters:  12/15/17 191 lb (86.6 kg)  10/27/17 197 lb 12.8 oz (89.7 kg)  09/14/17 189 lb (85.7 kg)    General: Pleasant. Well developed, well nourished and in no acute distress.   HEENT: Normal.  Neck: Supple, no JVD, carotid bruits, or masses noted.  Cardiac: ***Regular rate and rhythm. No murmurs, rubs, or gallops. No edema.  Respiratory:  Lungs are clear to auscultation bilaterally with normal work of breathing.  GI: Soft and nontender.  MS: No deformity or atrophy. Gait and ROM intact.  Skin: Warm and dry. Color is normal.  Neuro:  Strength and sensation are intact and no gross focal deficits noted.  Psych: Alert, appropriate and with normal affect.   LABORATORY DATA:  EKG:  EKG {ACTION; IS/IS FYB:[id number]} ordered today. This demonstrates ***.  Lab Results  Component Value Date   WBC 7.3 12/15/2017   HGB 13.5 12/15/2017   HCT 42.2 12/15/2017   PLT 239 12/15/2017   GLUCOSE 87 12/15/2017   CHOL 147 06/24/2016   TRIG 158 (H) 06/24/2016   HDL 70 06/24/2016   LDLCALC 45 06/24/2016   ALT 24 12/15/2017   AST 28 12/15/2017   NA 141 12/15/2017   K 4.2 12/15/2017   CL 107 12/15/2017   CREATININE 1.30 (H) 12/15/2017   BUN 20 12/15/2017   CO2 30 12/15/2017   TSH 5.020 (H) 06/24/2016   INR 1.17 12/20/2014   HGBA1C 5.6 12/20/2014       BNP (last 3 results) No results for input(s): BNP in the last 8760 hours.  ProBNP (last 3  results) Recent Labs    07/29/17 1153  PROBNP 525*     Other Studies Reviewed Today:   Assessment/Plan: Myoview Study Highlights from 04/2015   Nuclear stress EF: 56%.  The study is normal.  This is a low risk study.  The left ventricular ejection fraction is normal (55-65%).  Normal resting and stress perfusion EF 56%    CXR IMPRESSION: Chronic volume loss with loculated effusions on the left. Scarring right base. Stable cardiac silhouette. No new opacity. Overall stable study. Electronically Signed By: Gwyndolyn Saxon  Jasmine December III M.D. On: 10/29/2015 16:13  CT CHEST  1. Today's study demonstrates stable postradiation changes of mass-like fibrosis both in the paramediastinal aspect of the left upper lobe and in the central infrahilar region of the right lower lobe. No definite findings to suggest local recurrence of disease or definite metastatic disease in the thorax. 2. There is a new nondisplaced fracture of the posterolateral aspect of the left sixth rib. This may be pathologic secondary to postradiation osteonecrosis given the proximity to the treated area in the left lung. The possibility of a metastatic lesion with nondisplaced fracture is not entirely excluded, however, and close attention on followup studies is recommended. 3. Small partially loculated pericardial effusion which exerts slight mass effect upon the right heart. These findings are very similar to prior examinations. No pericardial calcification. 4. Aortic atherosclerosis, in addition to left main and 3 vessel coronary artery disease, with evidence of scarring from prior left circumflex coronary artery territory myocardial infarction, as above. Assessment for potential risk factor modification, dietary therapy or pharmacologic therapy may be warranted, if clinically indicated. 5. Additional incidental findings, as above. Electronically Signed By: Vinnie Langton M.D. On:  10/29/2015 16:54   Echo Study Conclusions 12/2014  - Left ventricle: The cavity size was normal. Wall thickness was increased in a pattern of moderate LVH. Systolic function was normal. The estimated ejection fraction was in the range of 60% to 65%. Wall motion was normal; there were no regional wall motion abnormalities. Doppler parameters are consistent with abnormal left ventricular relaxation (grade 1 diastolic dysfunction). The E/e&' ratio is >15, suggesting elevated LV filling pressure. - Aortic valve: Mildly calcified- there may be fusion of the non and right coronary cusps. There was mild regurgitation. - Mitral valve: Mildly thickened leaflets . There was mild regurgitation. - Left atrium: The atrium was normal in size. - Tricuspid valve: There was mild regurgitation. - Pulmonary arteries: PA peak pressure: 37 mm Hg (S). - Inferior vena cava: The vessel was normal in size. The respirophasic diameter changes were in the normal range (>= 50%), consistent with normal central venous pressure. - Pericardium, extracardiac: A trivial pericardial effusion was identified. Features were not consistent with tamponade physiology.  Impressions:  - Compared to a prior echo in 2012, there are a few changes. LV filling pressures appear elevated. There is a persistent trivial pericardial effusion without tamponade features. RVSP is higher and there is now moderate LVH.   Assessment/Plan: 1. Chronic shortness of breath -sounds like he has an S3 on exam - I would like to get an echocardiogram to make sure he has not had worsening EF. For now, no changes - further disposition to follow.   2. CAD - no active chest pain noted. Would favor more conservative management. Low risk Myoview from January of 2017.  3. Memory impairment - he has really declinedover the last several years. He admits his memory is getting worse.Not discussed today.   4.  Past pericardial effusion - s/p window - lastCT findings basically unchanged. Getting echo updated due to abnormal heart sounds on exam.   5. Prior lung cancers/melanoma - followed byDr. Marin Olp in [place].  6. HLD-on statin therapy - labs from January noted.     Current medicines are reviewed with the patient today.  The patient does not have concerns regarding medicines other than what has been noted above.  The following changes have been made:  See above.  Labs/ tests ordered today include:   No orders of the  defined types were placed in this encounter.    Disposition:   FU with *** in {gen number 0-14:103013} {Days to years:10300}.   Patient is agreeable to this plan and will call if any problems develop in the interim.   SignedTruitt Merle, NP  01/19/2018 7:38 AM  [facility] [address] [place], [place]  [address] Phone: [phone] Fax: [phone]

## 2018-01-19 NOTE — Telephone Encounter (Signed)
lvm for pt to call back office to make sure pt is ok.  Missed appt today.

## 2018-01-22 NOTE — Telephone Encounter (Signed)
S/w pt finally t/w pt.  [name] Fields was concerned due to pt missing appt.  R/S pt for Tuesday, Oct 22.  Pt apologized and was thankful appt was r/s .

## 2018-01-26 ENCOUNTER — Ambulatory Visit (INDEPENDENT_AMBULATORY_CARE_PROVIDER_SITE_OTHER): Payer: Medicare Other | Admitting: Nurse Practitioner

## 2018-01-26 ENCOUNTER — Encounter: Payer: Self-pay | Admitting: Nurse Practitioner

## 2018-01-26 ENCOUNTER — Ambulatory Visit (INDEPENDENT_AMBULATORY_CARE_PROVIDER_SITE_OTHER)
Admission: RE | Admit: 2018-01-26 | Discharge: 2018-01-26 | Disposition: A | Discharge status: Home / Self Care | Payer: Medicare Other | Source: Ambulatory Visit | Attending: Emergency Medicine | Admitting: Emergency Medicine

## 2018-01-26 VITALS — BP 90/70 | HR 65 | Wt 199.0 lb

## 2018-01-26 DIAGNOSIS — J189 Pneumonia, unspecified organism: Secondary | ICD-10-CM | POA: Diagnosis not present

## 2018-01-26 DIAGNOSIS — R0602 Shortness of breath: Secondary | ICD-10-CM

## 2018-01-26 DIAGNOSIS — C3490 Malignant neoplasm of unspecified part of unspecified bronchus or lung: Secondary | ICD-10-CM

## 2018-01-26 DIAGNOSIS — I259 Chronic ischemic heart disease, unspecified: Secondary | ICD-10-CM

## 2018-01-26 NOTE — Patient Instructions (Addendum)
We will be checking the following labs today - NONE   Medication Instructions:    Continue with your current medicines.    If you need a refill on your cardiac medications before your next appointment, please call your pharmacy.     Testing/Procedures To Be Arranged:  We are getting the CT scan of your chest arranged for today  Follow-Up:   We will get you a visit with Dr. Lamonte Sakai or his staff to follow up the CT scan we are doing today.   I will see you in 6 months.     Special Instructions:  . None  Call the [place] office at [phone] if you have any questions, problems or concerns.

## 2018-01-26 NOTE — Progress Notes (Signed)
CARDIOLOGY OFFICE NOTE  Date:  01/26/2018    [name] Fields Date of Birth: 21-Aug-1934 Medical Record #[id number]  PCP:  [name], MD  Cardiologist:  [name]  Chief Complaint  Patient presents with  . Congestive Heart Failure  . Shortness of Breath    Follow up visit - seen for Dr. Marlou Porch    History of Present Illness: [name] Fields is a 83 y.o. male who presents today for a follow up visit. Seen for Dr. Marlou Porch. He is a former patient of Dr. Susa Simmonds that I cared for for many years. I typically see him.   He has a history of coronary artery disease with prior lateral wall MI status post multiple stents in the past. Other issues include lung cancer in both sides (treated with radiation), melanoma of the left cheeck, fibrosis, recurrent pleural effusion. He has had a pericardial window for an effusion back in 2011.   I saw him back in July of 2017- he had been referred back for shortness of breath. Productive cough - COPD - I sent him back to pulmonary for evaluation/treatment - seemed more pulmonary related/radiation effects. Did not understand how to use his Spiriva. Some swelling - getting too much salt. I added some low dose Lasix. He was better on return but has remained very limited by his breathing.   Last seen in April - lots of falls. Considerable short of breath. Seeing Dr. Lamonte Sakai for his emphysema.   Comes in today. Here alone. Says he has been sick all summer. Cough and congestion - "can't get rid of it". Lots of wheezing. He has been on what sounds like escalating doses of prednisone. Says nothing "has worked". Saw oncology last month - noted that he was some better at that time but still with cough of brown sputum. He says he continues to cough up "brown" sputum. No fever. Some chills at times. More short of breath. He has to stand for a few minutes before walking or "I will fall out". He did fall last week. Looks like he saw Dr. Lamonte Sakai in  July - noted need for repeat CT - this has not happened. He thinks his heart is doing ok. No chest pain. He has gotten more limited in his ability to take care of himself. He is not on oxygen.   Past Medical History:  Diagnosis Date  . Anxiety    takes wellbutrin daily  . Arthritis    knees  . COPD (chronic obstructive pulmonary disease) ([place])   . Diverticulosis   . Emphysema of lung ([place])   . GERD (gastroesophageal reflux disease)   . Hearing loss   . Hx of radiation therapy 09/29/11;10/01/11;10/03/11;10/06/11;10/08/11;   RLLlung,50Gy/37fx - SBRT  . Hyperlipidemia   . Hypertension   . Hypokinesia    MILD INFERIOR  . IHD (ischemic heart disease)    Remote MI in 2001 with stent to the LCX  . Lactose intolerance   . Non-small cell carcinoma of lung ([place])    Dx in October of 2010  . Normal nuclear stress test 2009   EF 63%. No ischemia. 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  . Pericardial effusion July 2011   s/p pericardial window; last echo in August 2012 with minimal effusion  . Pleural effusion, left   . SOB (shortness of breath)    Chronic with known fibrosis  . Status post chemotherapy 06/04/2009 -  09/06/2009    4 cycles of Cisplatin and Gemcitabine  with Neulasta Support   . Vertigo    hx. of- ambulates with cane for safety.    Past Surgical History:  Procedure Laterality Date  . CARDIAC CATHETERIZATION  07/11/2003   EF 60%, REPEAT, WHICH SHOWED 30% PROXIMAL NARROWING IN THE RIGHT  CORONARY ARTERY  . CARDIOVASCULAR STRESS TEST  05/2007   EF 63%  . CATARACT EXTRACTION, BILATERAL Bilateral   . COLONOSCOPY WITH PROPOFOL N/A 01/30/2015   Procedure: COLONOSCOPY WITH PROPOFOL;  Surgeon: Garlan Fair, MD;  Location: [facility];  Service: Endoscopy;  Laterality: N/A;  . CORONARY STENT PLACEMENT  07/1999   STENT TO THE LEFT CIRCUMFLEX  . HERNIA REPAIR     as an adult- R inguinal   . PERICARDIAL WINDOW  July 2011  . TRANSTHORACIC ECHOCARDIOGRAM  02/22/2010   EF 55-60%      Medications: Current Meds  Medication Sig  . albuterol (PROVENTIL HFA;VENTOLIN HFA) 108 (90 Base) MCG/ACT inhaler Inhale 2 puffs into the lungs every 6 (six) hours as needed for wheezing or shortness of breath.  Marland Kitchen albuterol (PROVENTIL) (2.5 MG/3ML) 0.083% nebulizer solution Take 3 mLs (2.5 mg total) by nebulization every 4 (four) hours as needed for wheezing.  Marland Kitchen aspirin EC 81 MG tablet Take 81 mg by mouth daily.  Marland Kitchen atorvastatin (LIPITOR) 20 MG tablet TAKE 1 TABLET BY MOUTH DAILY (Patient taking differently: Take 20 mg by mouth once a day)  . buPROPion (WELLBUTRIN SR) 150 MG 12 hr tablet Take 150 mg by mouth daily.   . Cholecalciferol (VITAMIN D3) 2000 units TABS Take 2,000 Units by mouth 2 (two) times daily.   . furosemide (LASIX) 40 MG tablet Take 1 tablet (40 mg total) by mouth daily.  Marland Kitchen guaiFENesin (MUCINEX) 600 MG 12 hr tablet Take 1 tablet (600 mg total) by mouth 2 (two) times daily.  Marland Kitchen ipratropium-albuterol (DUONEB) 0.5-2.5 (3) MG/3ML SOLN Take 3 mLs by nebulization every 6 (six) hours as needed.  . linaclotide (LINZESS) 290 MCG CAPS capsule Take 290 mcg by mouth 2 (two) times a week.  . magic mouthwash SOLN Take 5 mLs by mouth 3 (three) times daily.  . metoprolol tartrate (LOPRESSOR) 25 MG tablet TAKE 1/2 TABLET BY MOUTH TWICE DAILY  . predniSONE (DELTASONE) 10 MG tablet Take 10 mg by mouth daily with breakfast.  . Tamsulosin HCl (FLOMAX) 0.4 MG CAPS Take 0.4 mg by mouth daily with supper.   . temazepam (RESTORIL) 30 MG capsule Take 30 mg by mouth at bedtime  . Tiotropium Bromide-Olodaterol (STIOLTO RESPIMAT) 2.5-2.5 MCG/ACT AERS Inhale 2 puffs into the lungs daily.  Marland Kitchen venlafaxine XR (EFFEXOR-XR) 150 MG 24 hr capsule Take 150 mg by mouth daily.      Allergies: Allergies  Allergen Reactions  . Sertraline Hcl     Patient does not remember the reaction    Social History: The patient  reports that he quit smoking about 23 years ago. His smoking use included cigarettes. He  has a 120.00 pack-year smoking history. He has never used smokeless tobacco. He reports that he does not drink alcohol or use drugs.   Family History: The patient's family history includes Lung cancer in his father; Stroke in his sister.   Review of Systems: Please see the history of present illness.   Otherwise, the review of systems is positive for none.   All other systems are reviewed and negative.   Physical Exam: VS:  BP 90/70 (BP Location: Left Arm, Patient Position: Sitting, Cuff Size: Normal)  Pulse 65   Wt 199 lb (90.3 kg)   BMI 28.55 kg/m  .  BMI Body mass index is 28.55 kg/m.  Wt Readings from Last 3 Encounters:  01/26/18 199 lb (90.3 kg)  12/15/17 191 lb (86.6 kg)  10/27/17 197 lb 12.8 oz (89.7 kg)    General: He is chronically ill. He was not able to walk in - had to be placed in a wheelchair. He has audible stridor with exertion and shortness of breath with exertion.    HEENT: Normal.  Neck: Supple, no JVD, carotid bruits, or masses noted.  Cardiac: Regular rate and rhythm. Soft murmur. No edema.  Respiratory:  Lungs are clear to auscultation bilaterally with normal work of breathing.  GI: Soft and nontender.  MS: No deformity or atrophy. Gait not tested.  Skin: Warm and dry. Color is normal.  Neuro:  Strength and sensation are intact and no gross focal deficits noted.  Psych: Alert, appropriate and with normal affect.   LABORATORY DATA:  EKG:  EKG is ordered today. This demonstrates NSR with 1st degree AV block, inferior Q's and anterior Q's - unchanged.  Lab Results  Component Value Date   WBC 7.3 12/15/2017   HGB 13.5 12/15/2017   HCT 42.2 12/15/2017   PLT 239 12/15/2017   GLUCOSE 87 12/15/2017   CHOL 147 06/24/2016   TRIG 158 (H) 06/24/2016   HDL 70 06/24/2016   LDLCALC 45 06/24/2016   ALT 24 12/15/2017   AST 28 12/15/2017   NA 141 12/15/2017   K 4.2 12/15/2017   CL 107 12/15/2017   CREATININE 1.30 (H) 12/15/2017   BUN 20 12/15/2017   CO2 30  12/15/2017   TSH 5.020 (H) 06/24/2016   INR 1.17 12/20/2014   HGBA1C 5.6 12/20/2014       BNP (last 3 results) No results for input(s): BNP in the last 8760 hours.  ProBNP (last 3 results) Recent Labs    07/29/17 1153  PROBNP 525*     Other Studies Reviewed Today:  Echo Study Conclusions 07/2017  - Left ventricle: The cavity size was normal. Wall thickness was   increased in a pattern of mild LVH. Systolic function was normal.   The estimated ejection fraction was in the range of 55% to 60%.   Although no diagnostic regional wall motion abnormality was   identified, this possibility cannot be completely excluded on the   basis of this study. Features are consistent with a pseudonormal   left ventricular filling pattern, with concomitant abnormal   relaxation and increased filling pressure (grade 2 diastolic   dysfunction). - Aortic valve: Noncoronary cusp mobility was moderately   restricted. There was mild regurgitation. Valve area (VTI): 2.61   cm^2. - Mitral valve: There was moderate regurgitation directed   centrally. Diastolic regurgitation was present. - Left atrium: The atrium was moderately dilated. - Right ventricle: The cavity size was mildly dilated. Systolic   function was mildly reduced. - Right atrium: The atrium was mildly dilated. - Tricuspid valve: There was mild regurgitation. Diastolic   regurgitation was present. - Pulmonary arteries: Systolic pressure was mildly increased. PA   peak pressure: 39 mm Hg (S). - Pericardium, extracardiac: A trivial pericardial effusion was   identified.   Myoview Study Highlights from 04/2015   Nuclear stress EF: 56%.  The study is normal.  This is a low risk study.  The left ventricular ejection fraction is normal (55-65%).  Normal resting and stress perfusion EF 56%  Assessment/Plan:  1. Chronic shortness of breath -most likely multifactorial - clearly progressive. Cough with brown  sputum and intermittent audible stridor.  Needs CT of the chest as ordered by Dr. Lamonte Sakai with follow up - will arrange for CT later today.   2. CAD - no active chest pain noted. Would favor more conservative management. Low risk Myoview from January of 2017.  3. Multiple falls - unclear etiology - he seems to be in general decline to me. ?how much his memory is playing a role here as well.   4. Past pericardial effusion - s/p window - lastCT findings basically unchanged. Last echo noted just trivial effusion.   5. Prior lung cancers/melanoma - followed byDr. Marin Olp in [place].  6. HLD-on statin therapy - labs from the [facility] noted.  Current medicines are reviewed with the patient today.  The patient does not have concerns regarding medicines other than what has been noted above.  The following changes have been made:  See above.  Labs/ tests ordered today include:    Orders Placed This Encounter  Procedures  . EKG 12-Lead     Disposition:   FU with me in 6 months. Will try to arrange pulmonary follow up soon.   Patient is agreeable to this plan and will call if any problems develop in the interim.   SignedTruitt Merle, NP  01/26/2018 12:10 PM  [facility] [address] [place], [place]  [address] Phone: [phone] Fax: [phone]

## 2018-01-27 ENCOUNTER — Telehealth: Payer: Self-pay | Admitting: Nurse Practitioner

## 2018-01-27 ENCOUNTER — Encounter: Payer: Self-pay | Admitting: Nurse Practitioner

## 2018-01-27 ENCOUNTER — Ambulatory Visit (INDEPENDENT_AMBULATORY_CARE_PROVIDER_SITE_OTHER): Payer: Medicare Other | Admitting: Nurse Practitioner

## 2018-01-27 DIAGNOSIS — J309 Allergic rhinitis, unspecified: Secondary | ICD-10-CM | POA: Diagnosis not present

## 2018-01-27 DIAGNOSIS — C3492 Malignant neoplasm of unspecified part of left bronchus or lung: Secondary | ICD-10-CM | POA: Diagnosis not present

## 2018-01-27 DIAGNOSIS — J438 Other emphysema: Secondary | ICD-10-CM

## 2018-01-27 DIAGNOSIS — R911 Solitary pulmonary nodule: Secondary | ICD-10-CM

## 2018-01-27 MED ORDER — LORATADINE 10 MG PO TABS: 10.0000 mg | ORAL_TABLET | Freq: Every day | ORAL | 11 refills | Status: AC

## 2018-01-27 NOTE — Patient Instructions (Addendum)
Will order PET scan - CT showed new nodularity in right lung ? tumor Will discuss recent CT scan with Dr. Lamonte Sakai  Will order Claritin Patient did not qualify for O2 today. Sats remained in the upper 90's during walk in office today.  Follow up with Dr. Lamonte Sakai at first available appt. or sooner if needed

## 2018-01-27 NOTE — Progress Notes (Signed)
@Patient  ID: [name] Fields, male    DOB: 1934-06-16, 83 y.o.   MRN: [id number]  Chief Complaint  Patient presents with  . Shortness of Breath    Referring provider: [name], MD  HPI 83 year old male former smoker with COPD, A-1 AT MS phenotype, post radiation interstitial lung disease followed by Dr. Lamonte Sakai.  History includes non-small cell lung cancer, separate primaries one in the right lower lobe and one in the left lung, treated with chemoradiation.   Tests:  CT Chest 01/27/18 - Post treatment changes in the LEFT hilar region, stable over multiple prior studies. Stable LEFT pleural thickening and volume loss. Stable focal consolidation in the RIGHT LOWER lobe. New nodularity along the minor fissure and extending into the RIGHT UPPER lobe warranting follow-up. Considerations include subpleural lymph nodes or tumor. Stable small pericardial effusion. Pulmonary artery enlargement consistent with pulmonary arterial hypertension. Cholelithiasis. Emphysema. Aortic Atherosclerosis.   PFT Results Latest Ref Rng & Units 03/20/2016 11/05/2015  FVC-Pre L 1.81 1.86  FVC-Predicted Pre % 46 47  FVC-Post L 1.85 1.87  FVC-Predicted Post % 47 47  Pre FEV1/FVC % % 64 66  Post FEV1/FCV % % 68 71  FEV1-Pre L 1.16 1.22  FEV1-Predicted Pre % 41 43  DLCO UNC% % - 32  DLCO COR %Predicted % - 76  TLC L - 4.01  TLC % Predicted % - 58  RV % Predicted % - 86     OV 01/27/18 - follow up shortness of breath Patient presents with ongoing shortness of breath and cough with stridor. He is compliant with Stiolto. States that he gets short of breath with exertion. He gets short of breath when going from sitting to standing. He denies GERD. He did not start Claritin as directed at last visit. He saw cardiology yesterday. Cardiology felt that symptoms were more pulmonary related. He denies any chest pain, edema, or fever. He also had CT scan yesterday that was ordered by Dr. Lamonte Sakai.   Walked in  office today>no desaturation   Allergies  Allergen Reactions  . Sertraline Hcl     Patient does not remember the reaction    Immunization History  Administered Date(s) Administered  . Influenza Split 12/07/2010  . Influenza Whole 01/05/2010  . Influenza, High Dose Seasonal PF 01/06/2016, 01/13/2017  . Influenza,inj,Quad PF,6+ Mos 02/08/2013, 12/28/2014  . Influenza-Unspecified 02/13/2012  . Pneumococcal Conjugate-13 12/18/2015  . Pneumococcal Polysaccharide-23 01/05/2010    Past Medical History:  Diagnosis Date  . Anxiety    takes wellbutrin daily  . Arthritis    knees  . COPD (chronic obstructive pulmonary disease) ([place])   . Diverticulosis   . Emphysema of lung ([place])   . GERD (gastroesophageal reflux disease)   . Hearing loss   . Hx of radiation therapy 09/29/11;10/01/11;10/03/11;10/06/11;10/08/11;   RLLlung,50Gy/50fx - SBRT  . Hyperlipidemia   . Hypertension   . Hypokinesia    MILD INFERIOR  . IHD (ischemic heart disease)    Remote MI in 2001 with stent to the LCX  . Lactose intolerance   . Non-small cell carcinoma of lung ([place])    Dx in October of 2010  . Normal nuclear stress test 2009   EF 63%. No ischemia. 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  . Pericardial effusion July 2011   s/p pericardial window; last echo in August 2012 with minimal effusion  . Pleural effusion, left   . SOB (shortness of breath)    Chronic with known fibrosis  .  Status post chemotherapy 06/04/2009 -  09/06/2009    4 cycles of Cisplatin and Gemcitabine with Neulasta Support   . Vertigo    hx. of- ambulates with cane for safety.    Tobacco History: Social History   Tobacco Use  Smoking Status Former Smoker  . Packs/day: 3.00  . Years: 40.00  . Pack years: 120.00  . Types: Cigarettes  . Last attempt to quit: 04/07/1994  . Years since quitting: 23.8  Smokeless Tobacco Never Used   Counseling given: Yes   Outpatient Encounter Medications as of 01/27/2018  Medication Sig  . albuterol (PROVENTIL  HFA;VENTOLIN HFA) 108 (90 Base) MCG/ACT inhaler Inhale 2 puffs into the lungs every 6 (six) hours as needed for wheezing or shortness of breath.  Marland Kitchen albuterol (PROVENTIL) (2.5 MG/3ML) 0.083% nebulizer solution Take 3 mLs (2.5 mg total) by nebulization every 4 (four) hours as needed for wheezing.  Marland Kitchen aspirin EC 81 MG tablet Take 81 mg by mouth daily.  Marland Kitchen atorvastatin (LIPITOR) 20 MG tablet TAKE 1 TABLET BY MOUTH DAILY (Patient taking differently: Take 20 mg by mouth once a day)  . buPROPion (WELLBUTRIN SR) 150 MG 12 hr tablet Take 150 mg by mouth daily.   . Cholecalciferol (VITAMIN D3) 2000 units TABS Take 2,000 Units by mouth 2 (two) times daily.   . furosemide (LASIX) 40 MG tablet Take 1 tablet (40 mg total) by mouth daily.  Marland Kitchen guaiFENesin (MUCINEX) 600 MG 12 hr tablet Take 1 tablet (600 mg total) by mouth 2 (two) times daily.  Marland Kitchen ipratropium-albuterol (DUONEB) 0.5-2.5 (3) MG/3ML SOLN Take 3 mLs by nebulization every 6 (six) hours as needed.  . linaclotide (LINZESS) 290 MCG CAPS capsule Take 290 mcg by mouth 2 (two) times a week.  . magic mouthwash SOLN Take 5 mLs by mouth 3 (three) times daily.  . metoprolol tartrate (LOPRESSOR) 25 MG tablet TAKE 1/2 TABLET BY MOUTH TWICE DAILY  . predniSONE (DELTASONE) 10 MG tablet Take 10 mg by mouth daily with breakfast.  . Tamsulosin HCl (FLOMAX) 0.4 MG CAPS Take 0.4 mg by mouth daily with supper.   . temazepam (RESTORIL) 30 MG capsule Take 30 mg by mouth at bedtime  . Tiotropium Bromide-Olodaterol (STIOLTO RESPIMAT) 2.5-2.5 MCG/ACT AERS Inhale 2 puffs into the lungs daily.  Marland Kitchen venlafaxine XR (EFFEXOR-XR) 150 MG 24 hr capsule Take 150 mg by mouth daily.   Marland Kitchen loratadine (CLARITIN) 10 MG tablet Take 1 tablet (10 mg total) by mouth daily.   No facility-administered encounter medications on file as of 01/27/2018.      Review of Systems  Review of Systems  Constitutional: Negative.  Negative for chills and fever.  HENT: Negative.  Negative for congestion.     Respiratory: Positive for cough and shortness of breath. Negative for wheezing.   Cardiovascular: Negative.  Negative for chest pain, palpitations and leg swelling.  Gastrointestinal: Negative.   Allergic/Immunologic: Negative.   Neurological: Negative.   Psychiatric/Behavioral: Negative.        Physical Exam  BP 132/76 (BP Location: Left Arm, Patient Position: Sitting, Cuff Size: Normal)   Pulse 65   Ht 5\' 10"  (1.778 m)   Wt 196 lb 12.8 oz (89.3 kg)   SpO2 98%   BMI 28.24 kg/m   Wt Readings from Last 5 Encounters:  01/27/18 196 lb 12.8 oz (89.3 kg)  01/26/18 199 lb (90.3 kg)  12/15/17 191 lb (86.6 kg)  10/27/17 197 lb 12.8 oz (89.7 kg)  09/14/17 189 lb (85.7 kg)  Physical Exam  Constitutional: He is oriented to person, place, and time. He appears well-developed and well-nourished. No distress.  Cardiovascular: Normal rate and regular rhythm.  Pulmonary/Chest: Effort normal and breath sounds normal. He has no decreased breath sounds. He has no wheezes. He has no rhonchi. He has no rales.  Musculoskeletal:       Right lower leg: He exhibits no edema.  Neurological: He is alert and oriented to person, place, and time.  Skin: Skin is warm and dry.  Psychiatric: He has a normal mood and affect.  Nursing note and vitals reviewed.     Imaging: Ct Chest Wo Contrast  Result Date: 01/26/2018 CLINICAL DATA:  Chronic SOB. Hx of Rt lung ca with surgery, chemo, and radiation. History of non-small cell lung cancer, metastatic. Assess treatment response. EXAM: CT CHEST WITHOUT CONTRAST TECHNIQUE: Multidetector CT imaging of the chest was performed following the standard protocol without IV contrast. COMPARISON:  CT of the chest on 08/24/2017, 11/08/2013 FINDINGS: Cardiovascular: The heart is enlarged. Small pericardial effusion is present, measuring maximum of 11 millimeters and stable in appearance. There is dense atherosclerotic calcification of coronary vessels. Calcification  of the mitral annulus. There is atherosclerotic calcification of the thoracic aorta not associated with aneurysm. The pulmonary arteries are enlarged, consistent with pulmonary arterial hypertension. Mediastinum/Nodes: The visualized portion of the thyroid gland has a normal appearance. No mediastinal or axillary adenopathy. Lungs/Pleura: Stable appearance of LEFT pleural thickening and volume loss. Partially calcified soft tissue density in the LEFT hilar region appears stable, consistent with treated malignancy. This area measures 9.1 x 2.5 centimeters. There is persistent consolidation in the RIGHT LOWER lobe stable over prior exams and consistent with chronic changes, measuring 3.4 x 4.3 centimeters. There is new subpleural thickening in the minor fissure and RIGHT UPPER lobe. Considerations include subpleural lymph nodes or pleural based tumor and follow-up is indicated. Nodule in the LEFT UPPER lobe is 5 millimeters on image 56 of series 3, stable in appearance. Emphysematous changes are present. Upper Abdomen: Calcified gallstones without CT evidence for acute cholecystitis. Musculoskeletal: No suspicious lytic or blastic lesions are identified. Stable mild irregularity of LEFT posterior ribs. IMPRESSION: 1. Post treatment changes in the LEFT hilar region, stable over multiple prior studies. Stable LEFT pleural thickening and volume loss. 2. 3. Stable focal consolidation in the RIGHT LOWER lobe. 4. New nodularity along the minor fissure and extending into the RIGHT UPPER lobe warranting follow-up. Considerations include subpleural lymph nodes or tumor. 5. Stable small pericardial effusion. 6. Pulmonary artery enlargement consistent with pulmonary arterial hypertension. 7. Cholelithiasis. 8.  Emphysema (ICD10-J43.9). 9.  Aortic Atherosclerosis (ICD10-I70.0). Electronically Signed   By: Nolon Nations M.D.   On: 01/26/2018 22:13     Assessment & Plan:   COPD (chronic obstructive pulmonary disease) with  emphysema [facility]) Patient Instructions  Will order PET scan - CT showed new nodularity in right lung ? tumor Will discuss recent CT scan with Dr. Lamonte Sakai  Will order Claritin Patient did not qualify for O2 today. Sats remained in the upper 90's during walk in office today.  Follow up with Dr. Lamonte Sakai at first available appt. or sooner if needed     Non-small cell lung cancer Patient Instructions  Will order PET scan - CT showed new nodularity in right lung ? tumor Will discuss recent CT scan with Dr. Lamonte Sakai  Will order Claritin Patient did not qualify for O2 today. Sats remained in the upper 90's during walk in office today.  Follow up with Dr. Lamonte Sakai at first available appt. or sooner if needed     Allergic rhinitis Patient Instructions  Will order PET scan - CT showed new nodularity in right lung ? tumor Will discuss recent CT scan with Dr. Lamonte Sakai  Will order Claritin Patient did not qualify for O2 today. Sats remained in the upper 90's during walk in office today.  Follow up with Dr. Lamonte Sakai at first available appt. or sooner if needed        Fenton Foy, NP 01/27/2018

## 2018-01-27 NOTE — Assessment & Plan Note (Signed)
Patient Instructions  Will order PET scan - CT showed new nodularity in right lung ? tumor Will discuss recent CT scan with Dr. Lamonte Sakai  Will order Claritin Patient did not qualify for O2 today. Sats remained in the upper 90's during walk in office today.  Follow up with Dr. Lamonte Sakai at first available appt. or sooner if needed

## 2018-01-28 NOTE — Telephone Encounter (Signed)
error 

## 2018-02-02 DIAGNOSIS — N401 Enlarged prostate with lower urinary tract symptoms: Secondary | ICD-10-CM | POA: Diagnosis not present

## 2018-02-02 DIAGNOSIS — I2510 Atherosclerotic heart disease of native coronary artery without angina pectoris: Secondary | ICD-10-CM | POA: Diagnosis not present

## 2018-02-02 DIAGNOSIS — F322 Major depressive disorder, single episode, severe without psychotic features: Secondary | ICD-10-CM | POA: Diagnosis not present

## 2018-02-02 DIAGNOSIS — N400 Enlarged prostate without lower urinary tract symptoms: Secondary | ICD-10-CM | POA: Diagnosis not present

## 2018-02-02 DIAGNOSIS — J449 Chronic obstructive pulmonary disease, unspecified: Secondary | ICD-10-CM | POA: Diagnosis not present

## 2018-02-02 DIAGNOSIS — I10 Essential (primary) hypertension: Secondary | ICD-10-CM | POA: Diagnosis not present

## 2018-02-02 DIAGNOSIS — I252 Old myocardial infarction: Secondary | ICD-10-CM | POA: Diagnosis not present

## 2018-02-02 DIAGNOSIS — E782 Mixed hyperlipidemia: Secondary | ICD-10-CM | POA: Diagnosis not present

## 2018-02-02 DIAGNOSIS — C3430 Malignant neoplasm of lower lobe, unspecified bronchus or lung: Secondary | ICD-10-CM | POA: Diagnosis not present

## 2018-02-02 DIAGNOSIS — Z85118 Personal history of other malignant neoplasm of bronchus and lung: Secondary | ICD-10-CM | POA: Diagnosis not present

## 2018-02-02 DIAGNOSIS — F329 Major depressive disorder, single episode, unspecified: Secondary | ICD-10-CM | POA: Diagnosis not present

## 2018-02-02 DIAGNOSIS — J441 Chronic obstructive pulmonary disease with (acute) exacerbation: Secondary | ICD-10-CM | POA: Diagnosis not present

## 2018-02-03 ENCOUNTER — Encounter (HOSPITAL_COMMUNITY): Payer: Self-pay

## 2018-02-03 ENCOUNTER — Ambulatory Visit (HOSPITAL_COMMUNITY)
Admission: RE | Admit: 2018-02-03 | Discharge: 2018-02-03 | Disposition: A | Discharge status: Home / Self Care | Payer: Medicare Other | Source: Ambulatory Visit | Attending: Nurse Practitioner | Admitting: Nurse Practitioner

## 2018-02-03 DIAGNOSIS — C3490 Malignant neoplasm of unspecified part of unspecified bronchus or lung: Secondary | ICD-10-CM | POA: Diagnosis not present

## 2018-02-03 DIAGNOSIS — R911 Solitary pulmonary nodule: Secondary | ICD-10-CM | POA: Diagnosis not present

## 2018-02-03 LAB — GLUCOSE, CAPILLARY: GLUCOSE-CAPILLARY: 94 mg/dL (ref 70–99)

## 2018-02-03 MED ORDER — FLUDEOXYGLUCOSE F - 18 (FDG) INJECTION
9.7700 | Freq: Once | INTRAVENOUS | Status: AC
  Administered 2018-02-03: 9.77 via INTRAVENOUS

## 2018-02-23 ENCOUNTER — Encounter: Payer: Self-pay | Admitting: Emergency Medicine

## 2018-02-23 ENCOUNTER — Ambulatory Visit (INDEPENDENT_AMBULATORY_CARE_PROVIDER_SITE_OTHER): Payer: Medicare Other | Admitting: Emergency Medicine

## 2018-02-23 DIAGNOSIS — C3492 Malignant neoplasm of unspecified part of left bronchus or lung: Secondary | ICD-10-CM | POA: Diagnosis not present

## 2018-02-23 DIAGNOSIS — I259 Chronic ischemic heart disease, unspecified: Secondary | ICD-10-CM | POA: Diagnosis not present

## 2018-02-23 DIAGNOSIS — J438 Other emphysema: Secondary | ICD-10-CM | POA: Diagnosis not present

## 2018-02-23 MED ORDER — LEVOFLOXACIN 500 MG PO TABS: 500.0000 mg | ORAL_TABLET | Freq: Every day | ORAL | 0 refills | Status: DC

## 2018-02-23 NOTE — Assessment & Plan Note (Signed)
Bilateral disease, most recently treated 2013.  PET scan reviewed as above no definite recurrence although there are some areas of hypermetabolism that do not seem to necessarily correlate with a radiographical finding on the CT component.  He needs a repeat CT scan in 6 months to look for interval stability.

## 2018-02-23 NOTE — Progress Notes (Signed)
Subjective:    Patient ID: [name] Fields, male    DOB: 1934/09/16, 83 y.o.   MRN: [id number]  HPI  ROV 02/23/18 --83 year old man with a history of heavy tobacco, alpha-1 antitrypsin heterozygosity MS genotype and associated severe COPD, multifactorial dyspnea due to this as well as non-small cell lung cancers of the right lower lobe in the left lung treated with chemoradiation, some lingering left-sided interstitial disease and left-sided pleural disease.  FOB at [facility] 08/25/2017 did not show an endobronchial lesion.  He also has significant upper airway irritation and hoarseness, stridor that have been hard to manage, were worse 01/27/2018 when he was last seen here.  Very tough history-giver. Multiple complaints, including some R sided abd pain for the last few days, no constipation. He is bringing up brownish mucous. He c/o persistent hoarse voice and a barking cough.   He is supposed to be on loratadine, has still never started it. Has clear nasal congestion.  He has Stiolto but doesn't use reliably. Has duoneb that he uses 2x a day, doesn't believe it helps him, most concerned about his cough and mucous burden His last pred was 2 months ago by Dr Inda Merlin - last abx were ???  His most recent CT scan of the chest was done on 10/22 which showed stable left-sided posttreatment changes with volume loss and pleural thickening, stable focal right lower lobe consolidation and a new nodularity along the minor fissure extending into the right upper lobe.  This prompted a PET scan which was done on 10/31 which I have reviewed.  This shows some nonspecific tracer uptake in the treated area of the left upper lobe, more nonspecific uptake in the central right lower lobe and an area of chronic masslike architectural distortion, also some increased uptake in the chronic left pleural changes.   Review of Systems As per HPI      Objective:   Physical Exam  Vitals:   02/23/18 1456  BP: (!) 154/102    Pulse: 82  SpO2: 95%  Weight: 194 lb (88 kg)  Height: 5\' 10"  (1.778 m)   Gen: Pleasant, elderly gentleman, in no distress,  normal affect  ENT: No lesions,  mouth clear,  oropharynx clear, no postnasal drip, hearing aids in place, hoarse voice  Neck: No JVD, stridor present. He can correct this with coaching  Lungs: No use of accessory muscles, referred UA noise, some soft exp wheezes B   Cardiovascular: RRR, heart sounds normal, no murmur or gallops, no peripheral edema  Musculoskeletal: No deformities, no cyanosis or clubbing  Neuro: alert, non focal  Skin: Warm, no lesions or rash     Assessment & Plan:  COPD (chronic obstructive pulmonary disease) with emphysema (HCC) He has a heavy tobacco history, severe COPD with bronchitic symptoms although he has never seemed to claim benefit from scheduled bronchodilators.  I am not sure that he understands that he supposed to take the Stiolto every day.  Underscored this with him today and told him that he will not get full benefit unless he uses it for stretch of time.  He mainly complains of difficulty clearing secretions, brown mucus.  He may have an active bronchitis.  I think is reasonable to treat with antibiotics, add mucolytic's.  Try to decrease his mucus burden by treating his allergic rhinitis.   Please restart Stiolto and take 2 puffs once daily every day until next visit.  We would like you to be able to report back whether  this medication helps your overall breathing. You may continue to use your DuoNeb or albuterol nebs up to every 6 hours if you need them for shortness of breath, coughing, mucus clearance, wheezing. Please start loratadine (Claritin) 10 mg once daily every day until next visit.  You can get this over-the-counter at the [facility] or [facility] outpatient pharmacy. Please start guaifenesin (Mucinex) 600 mg twice a day until next visit.  This is an over-the-counter medication Take levofloxacin 500 mg daily for the  next 10 days and then stop. Follow with Dr Lamonte Sakai or APP in 3 weeks to assess your status   Non-small cell lung cancer Bilateral disease, most recently treated 2013.  PET scan reviewed as above no definite recurrence although there are some areas of hypermetabolism that do not seem to necessarily correlate with a radiographical finding on the CT component.  He needs a repeat CT scan in 6 months to look for interval stability.  Baltazar Apo, MD, PhD 02/23/2018, 3:45 PM [facility] [phone] or if no answer [phone]

## 2018-02-23 NOTE — Patient Instructions (Addendum)
Your PET scan does not show any definite recurrence of lung cancer.  This is good news.  We need to follow a repeat CT scan of your chest without contrast in April 2020 to look for interval stability. Please restart Stiolto and take 2 puffs once daily every day until next visit.  We would like you to be able to report back whether this medication helps your overall breathing. You may continue to use your DuoNeb or albuterol nebs up to every 6 hours if you need them for shortness of breath, coughing, mucus clearance, wheezing. Please start loratadine (Claritin) 10 mg once daily every day until next visit.  You can get this over-the-counter at the [facility] or [facility] outpatient pharmacy. Please start guaifenesin (Mucinex) 600 mg twice a day until next visit.  This is an over-the-counter medication Take levofloxacin 500 mg daily for the next 10 days and then stop. Follow with Dr Lamonte Sakai or APP in 3 weeks to assess your status

## 2018-02-23 NOTE — Assessment & Plan Note (Signed)
He has a heavy tobacco history, severe COPD with bronchitic symptoms although he has never seemed to claim benefit from scheduled bronchodilators.  I am not sure that he understands that he supposed to take the Stiolto every day.  Underscored this with him today and told him that he will not get full benefit unless he uses it for stretch of time.  He mainly complains of difficulty clearing secretions, brown mucus.  He may have an active bronchitis.  I think is reasonable to treat with antibiotics, add mucolytic's.  Try to decrease his mucus burden by treating his allergic rhinitis.   Please restart Stiolto and take 2 puffs once daily every day until next visit.  We would like you to be able to report back whether this medication helps your overall breathing. You may continue to use your DuoNeb or albuterol nebs up to every 6 hours if you need them for shortness of breath, coughing, mucus clearance, wheezing. Please start loratadine (Claritin) 10 mg once daily every day until next visit.  You can get this over-the-counter at the [facility] or [facility] outpatient pharmacy. Please start guaifenesin (Mucinex) 600 mg twice a day until next visit.  This is an over-the-counter medication Take levofloxacin 500 mg daily for the next 10 days and then stop. Follow with Dr Lamonte Sakai or APP in 3 weeks to assess your status

## 2018-03-05 DIAGNOSIS — M5489 Other dorsalgia: Secondary | ICD-10-CM | POA: Diagnosis not present

## 2018-03-08 ENCOUNTER — Ambulatory Visit
Admission: RE | Admit: 2018-03-08 | Discharge: 2018-03-08 | Disposition: A | Discharge status: Home / Self Care | Payer: Medicare Other | Source: Ambulatory Visit | Attending: Nurse Practitioner | Admitting: Nurse Practitioner

## 2018-03-08 ENCOUNTER — Other Ambulatory Visit: Payer: Self-pay | Admitting: Nurse Practitioner

## 2018-03-08 DIAGNOSIS — W19XXXA Unspecified fall, initial encounter: Secondary | ICD-10-CM | POA: Diagnosis not present

## 2018-03-08 DIAGNOSIS — M545 Low back pain, unspecified: Secondary | ICD-10-CM

## 2018-03-08 DIAGNOSIS — S3992XA Unspecified injury of lower back, initial encounter: Secondary | ICD-10-CM | POA: Diagnosis not present

## 2018-03-15 DIAGNOSIS — Z85820 Personal history of malignant melanoma of skin: Secondary | ICD-10-CM | POA: Diagnosis not present

## 2018-03-15 DIAGNOSIS — L570 Actinic keratosis: Secondary | ICD-10-CM | POA: Diagnosis not present

## 2018-03-15 DIAGNOSIS — D229 Melanocytic nevi, unspecified: Secondary | ICD-10-CM | POA: Diagnosis not present

## 2018-03-16 ENCOUNTER — Ambulatory Visit (INDEPENDENT_AMBULATORY_CARE_PROVIDER_SITE_OTHER): Payer: Medicare Other | Admitting: Primary Care

## 2018-03-16 ENCOUNTER — Encounter: Payer: Self-pay | Admitting: Primary Care

## 2018-03-16 VITALS — BP 128/68 | HR 87 | Temp 97.8°F | Ht 70.0 in | Wt 192.4 lb

## 2018-03-16 DIAGNOSIS — J438 Other emphysema: Secondary | ICD-10-CM

## 2018-03-16 MED ORDER — UMECLIDINIUM-VILANTEROL 62.5-25 MCG/INH IN AEPB: 1.0000 | INHALATION_SPRAY | Freq: Every day | RESPIRATORY_TRACT | 1 refills | Status: DC

## 2018-03-16 MED ORDER — PREDNISONE 10 MG PO TABS: ORAL_TABLET | ORAL | 0 refills | Status: DC

## 2018-03-16 MED ORDER — IN-CHECK INSPIRATORY FLOW MTR DEVI: 1.0000 | 0 refills | Status: DC

## 2018-03-16 NOTE — Assessment & Plan Note (Signed)
In-check dial was low at 30 set on med high inhaler Change Stiolto to Anoro 1 puff daily Needs RX for prednisone taper d/t wheezing  I suspect he is not using inhaler or nebulizer's correctly Refer to home health nursing for medication check/management

## 2018-03-16 NOTE — Progress Notes (Signed)
@Patient  ID: [name] Fields, male    DOB: December 10, 1934, 83 y.o.   MRN: [id number]  Chief Complaint  Patient presents with  . Follow-up    about the same, cough with brown mucus, SOB all the time but worse with exertion, some wheezing    Referring provider: [name], MD  HPI:  83 year old male, former smoker quit in 1996(120-pack-year history).  Past medical history significant for COPD, non-small cell lung cancer, allergic rhinitis, hypertension, GERD.  Patient of Dr. Lamonte Sakai, last seen 11/19.  He was restarted on his Stiolto and given a course of Levaquin.   03/16/2018  Patient presents today for a one-month follow-up office visit.  Accompanied by his son.  Patient states that his breathing is about the same.  He still has a cough, states that Mucinex helps a lot.  Reports no change with addition of Stiolto.  He can't remember if he has been using nebulizers as prescribed. He is able to do some of his shopping, states that he has to take it slow. Son reports that he occasionally has to stop to catch his breath.   There are some barriers to inhaler use and medication management.  We will check his in-check dial flow meter to see if patient is an appropriate candidate for Stiolto Respimat inhaler.  Hx of several falls at home and out of bed. Recently started using a rolling walker. Has difficulty with stairs. Looking into getting a chair lift in the house, they will contact their PCP for prescription.     Allergies  Allergen Reactions  . Sertraline Hcl     Patient does not remember the reaction    Immunization History  Administered Date(s) Administered  . Influenza Split 12/07/2010  . Influenza Whole 01/05/2010  . Influenza, High Dose Seasonal PF 01/06/2016, 01/13/2017  . Influenza,inj,Quad PF,6+ Mos 02/08/2013, 12/28/2014  . Influenza-Unspecified 02/13/2012  . Pneumococcal Conjugate-13 12/18/2015  . Pneumococcal Polysaccharide-23 01/05/2010    Past Medical History:    Diagnosis Date  . Anxiety    takes wellbutrin daily  . Arthritis    knees  . COPD (chronic obstructive pulmonary disease) ([place])   . Diverticulosis   . Emphysema of lung ([place])   . GERD (gastroesophageal reflux disease)   . Hearing loss   . Hx of radiation therapy 09/29/11;10/01/11;10/03/11;10/06/11;10/08/11;   RLLlung,50Gy/40fx - SBRT  . Hyperlipidemia   . Hypertension   . Hypokinesia    MILD INFERIOR  . IHD (ischemic heart disease)    Remote MI in 2001 with stent to the LCX  . Lactose intolerance   . Non-small cell carcinoma of lung ([place])    Dx in October of 2010  . Normal nuclear stress test 2009   EF 63%. No ischemia. 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  . Pericardial effusion July 2011   s/p pericardial window; last echo in August 2012 with minimal effusion  . Pleural effusion, left   . SOB (shortness of breath)    Chronic with known fibrosis  . Status post chemotherapy 06/04/2009 -  09/06/2009    4 cycles of Cisplatin and Gemcitabine with Neulasta Support   . Vertigo    hx. of- ambulates with cane for safety.    Tobacco History: Social History   Tobacco Use  Smoking Status Former Smoker  . Packs/day: 3.00  . Years: 40.00  . Pack years: 120.00  . Types: Cigarettes  . Last attempt to quit: 04/07/1994  . Years since quitting: 23.9  Smokeless Tobacco  Never Used   Counseling given: Not Answered   Outpatient Medications Prior to Visit  Medication Sig Dispense Refill  . albuterol (PROVENTIL HFA;VENTOLIN HFA) 108 (90 Base) MCG/ACT inhaler Inhale 2 puffs into the lungs every 6 (six) hours as needed for wheezing or shortness of breath. 1 Inhaler 2  . albuterol (PROVENTIL) (2.5 MG/3ML) 0.083% nebulizer solution Take 3 mLs (2.5 mg total) by nebulization every 4 (four) hours as needed for wheezing. 75 mL 12  . aspirin EC 81 MG tablet Take 81 mg by mouth daily.    Marland Kitchen atorvastatin (LIPITOR) 20 MG tablet TAKE 1 TABLET BY MOUTH DAILY (Patient taking differently: Take 20 mg by mouth once a day) 90  tablet 1  . buPROPion (WELLBUTRIN SR) 150 MG 12 hr tablet Take 150 mg by mouth daily.     . Cholecalciferol (VITAMIN D3) 2000 units TABS Take 2,000 Units by mouth 2 (two) times daily.     . furosemide (LASIX) 40 MG tablet Take 1 tablet (40 mg total) by mouth daily. 90 tablet 0  . guaiFENesin (MUCINEX) 600 MG 12 hr tablet Take 1 tablet (600 mg total) by mouth 2 (two) times daily. 20 tablet 0  . HYDROcodone-acetaminophen (NORCO/VICODIN) 5-325 MG tablet TAKE 1/2 TABLET PO EVERY 6 HOURS  0  . ipratropium-albuterol (DUONEB) 0.5-2.5 (3) MG/3ML SOLN Take 3 mLs by nebulization every 6 (six) hours as needed.    Marland Kitchen levofloxacin (LEVAQUIN) 500 MG tablet Take 1 tablet (500 mg total) by mouth daily. 10 tablet 0  . loratadine (CLARITIN) 10 MG tablet Take 1 tablet (10 mg total) by mouth daily. 30 tablet 11  . magic mouthwash SOLN Take 5 mLs by mouth 3 (three) times daily.    . metoprolol tartrate (LOPRESSOR) 25 MG tablet TAKE 1/2 TABLET BY MOUTH TWICE DAILY 90 tablet 3  . predniSONE (DELTASONE) 10 MG tablet Take 10 mg by mouth daily with breakfast.    . Tamsulosin HCl (FLOMAX) 0.4 MG CAPS Take 0.4 mg by mouth daily with supper.     . temazepam (RESTORIL) 30 MG capsule Take 30 mg by mouth at bedtime    . tiZANidine (ZANAFLEX) 4 MG tablet TK 1 T PO TID FOR 7 DAYS PRN  0  . traMADol (ULTRAM) 50 MG tablet TAKE 1 TO 2 TABLETS PO TID PRN P  0  . venlafaxine XR (EFFEXOR-XR) 150 MG 24 hr capsule Take 150 mg by mouth daily.   1  . Tiotropium Bromide-Olodaterol (STIOLTO RESPIMAT) 2.5-2.5 MCG/ACT AERS Inhale 2 puffs into the lungs daily. 2 Inhaler 0  . linaclotide (LINZESS) 290 MCG CAPS capsule Take 290 mcg by mouth 2 (two) times a week.     No facility-administered medications prior to visit.     Review of Systems  Review of Systems  Constitutional: Negative.   HENT: Negative.   Respiratory: Positive for cough, shortness of breath and wheezing.   Cardiovascular: Negative.     Physical Exam  BP 128/68 (BP  Location: Left Arm, Cuff Size: Normal)   Pulse 87   Temp 97.8 F (36.6 C)   Ht 5\' 10"  (1.778 m)   Wt 192 lb 6.4 oz (87.3 kg)   SpO2 97%   BMI 27.61 kg/m  Physical Exam  Constitutional: He is oriented to person, place, and time. He appears well-developed and well-nourished. No distress.  HENT:  Head: Normocephalic and atraumatic.  Eyes: Pupils are equal, round, and reactive to light. EOM are normal.  Neck: Normal range of motion.  Neck supple.  Cardiovascular: Normal rate and regular rhythm.  Pulmonary/Chest: Effort normal. No respiratory distress.  Exp wheeze t/o   Musculoskeletal: Normal range of motion.  Amb with rolling walker  Neurological: He is alert and oriented to person, place, and time.  Skin: Skin is warm and dry.  Psychiatric: He has a normal mood and affect. His behavior is normal.     Lab Results:  CBC    Component Value Date/Time   WBC 7.3 12/15/2017 1020   WBC 8.2 11/05/2016 0352   RBC 4.41 12/15/2017 1020   HGB 13.5 12/15/2017 1020   HGB 15.2 02/02/2017 0842   HGB 14.6 07/18/2013 0851   HCT 42.2 12/15/2017 1020   HCT 46.0 02/02/2017 0842   HCT 44.8 07/18/2013 0851   PLT 239 12/15/2017 1020   PLT 290 02/02/2017 0842   PLT 259 06/24/2016 1129   MCV 95.7 12/15/2017 1020   MCV 93 02/02/2017 0842   MCV 88.4 07/18/2013 0851   MCH 30.6 12/15/2017 1020   MCHC 32.0 12/15/2017 1020   RDW 13.5 12/15/2017 1020   RDW 14.8 02/02/2017 0842   RDW 14.9 (H) 07/18/2013 0851   LYMPHSABS 1.5 12/15/2017 1020   LYMPHSABS 1.1 02/02/2017 0842   LYMPHSABS 0.7 (L) 07/18/2013 0851   MONOABS 0.8 12/15/2017 1020   MONOABS 0.6 07/18/2013 0851   EOSABS 0.1 12/15/2017 1020   EOSABS 0.1 02/02/2017 0842   BASOSABS 0.0 12/15/2017 1020   BASOSABS 0.0 02/02/2017 0842   BASOSABS 0.0 07/18/2013 0851    BMET    Component Value Date/Time   NA 141 12/15/2017 1020   NA 142 07/29/2017 1153   NA 144 02/02/2017 0842   NA 140 12/01/2016 0957   K 4.2 12/15/2017 1020   K 3.8  02/02/2017 0842   K 4.5 12/01/2016 0957   CL 107 12/15/2017 1020   CL 105 02/02/2017 0842   CL 104 08/27/2012 1421   CO2 30 12/15/2017 1020   CO2 29 02/02/2017 0842   CO2 24 12/01/2016 0957   GLUCOSE 87 12/15/2017 1020   GLUCOSE 105 02/02/2017 0842   GLUCOSE 72 08/27/2012 1421   BUN 20 12/15/2017 1020   BUN 22 07/29/2017 1153   BUN 13 02/02/2017 0842   BUN 21.2 12/01/2016 0957   CREATININE 1.30 (H) 12/15/2017 1020   CREATININE 1.1 02/02/2017 0842   CREATININE 1.1 12/01/2016 0957   CALCIUM 9.0 12/15/2017 1020   CALCIUM 9.5 02/02/2017 0842   CALCIUM 10.2 12/01/2016 0957   GFRNONAA 62 07/29/2017 1153   GFRNONAA >60 06/18/2017 0930   GFRAA 72 07/29/2017 1153   GFRAA >60 06/18/2017 0930    BNP    Component Value Date/Time   BNP 124.6 (H) 10/30/2015 1131    ProBNP    Component Value Date/Time   PROBNP 525 (H) 07/29/2017 1153    Imaging: Dg Lumbar Spine 2-3 Views  Result Date: 03/08/2018 CLINICAL DATA:  Low back pain after fall. EXAM: LUMBAR SPINE - 2-3 VIEW COMPARISON:  CT scan of November 04, 2016. FINDINGS: No spondylolisthesis is noted. Interval development of mild wedge compression deformity of T12 vertebral body is noted concerning for fracture of indeterminate age. Moderate degenerative disc disease is noted at L3-4 and L4-5. IMPRESSION: Interval development of mild wedge compression deformity of T12 vertebral body concerning for fracture of indeterminate age. If patient is symptomatic in this area, MRI may be performed for further evaluation. Electronically Signed   By: Marijo Conception, M.D.   On:  03/08/2018 17:59     Assessment & Plan:   COPD (chronic obstructive pulmonary disease) with emphysema ([place]) In-check dial was low at 30 set on med high inhaler Change Stiolto to Anoro 1 puff daily Needs RX for prednisone taper d/t wheezing  I suspect he is not using inhaler or nebulizer's correctly Refer to home health nursing for medication check/management   FU in 1-2  weeks with Beth NP    Martyn Ehrich, NP 03/16/2018

## 2018-03-16 NOTE — Patient Instructions (Addendum)
  Prednisone taper as prescribed   Stop Stiolto  Start Anoro 1 puff daily   Use albuterol nebulizer every 6 hours as needed for shortness of breath or wheezing   Needs home health for medication/inhaler management  Follow up in 1-2 weeks with Eustaquio Maize NP

## 2018-03-23 ENCOUNTER — Ambulatory Visit (INDEPENDENT_AMBULATORY_CARE_PROVIDER_SITE_OTHER): Payer: Medicare Other | Admitting: Primary Care

## 2018-03-23 ENCOUNTER — Ambulatory Visit (INDEPENDENT_AMBULATORY_CARE_PROVIDER_SITE_OTHER)
Admission: RE | Admit: 2018-03-23 | Discharge: 2018-03-23 | Disposition: A | Discharge status: Home / Self Care | Payer: Medicare Other | Source: Ambulatory Visit | Attending: Primary Care | Admitting: Primary Care

## 2018-03-23 ENCOUNTER — Encounter: Payer: Self-pay | Admitting: Primary Care

## 2018-03-23 VITALS — BP 124/74 | HR 74 | Temp 97.7°F | Ht 70.0 in | Wt 194.0 lb

## 2018-03-23 DIAGNOSIS — M549 Dorsalgia, unspecified: Secondary | ICD-10-CM | POA: Insufficient documentation

## 2018-03-23 DIAGNOSIS — J438 Other emphysema: Secondary | ICD-10-CM

## 2018-03-23 DIAGNOSIS — R05 Cough: Secondary | ICD-10-CM | POA: Diagnosis not present

## 2018-03-23 DIAGNOSIS — R0602 Shortness of breath: Secondary | ICD-10-CM | POA: Diagnosis not present

## 2018-03-23 MED ORDER — UMECLIDINIUM-VILANTEROL 62.5-25 MCG/INH IN AEPB: 1.0000 | INHALATION_SPRAY | Freq: Every day | RESPIRATORY_TRACT | 1 refills | Status: DC

## 2018-03-23 MED ORDER — AZELASTINE HCL 137 MCG/SPRAY NA SOLN: 2.0000 | Freq: Two times a day (BID) | NASAL | 2 refills | Status: DC

## 2018-03-23 NOTE — Patient Instructions (Addendum)
  Inhalers: Please start using Albuterol nebulizer 2-3 times a day for shortness of breath/wheezing Continue Anoro 1 puff daily (no more, no less) HOLD stiolto   Recommendations: Continue Mucinex twice daily Add flutter valve three times a day for congestion  Add azelastine nasal spray daily - 1-2 puffs per each nostril twice a day   Continue Claritin daily  Continue prednisone 10mg  daily   Orders: Chest xray today   Follow-up: 2 weeks with Beth NP from [facility] pulmonary Follow-up with PCP regarding right lower back pain

## 2018-03-23 NOTE — Assessment & Plan Note (Signed)
-   Complains or right lower back pain - CXR showed new compression fracture of the LOWER endplate of L1 - Patient needs to follow-up with PCP

## 2018-03-23 NOTE — Progress Notes (Signed)
@Patient  ID: [name] Fields, male    DOB: November 20, 1934, 83 y.o.   MRN: [id number]  Chief Complaint  Patient presents with  . Follow-up    SOB especially with exertion, cough with green/brown, wheezing    Referring provider: [name], MD  HPI: 83 year old male, former smoker quit in 1996. PMH significant for severe COPD with emphysema, non-small cell lung cancer RLL, pulmonary nodules, alpha-1 antitrypsin deficiency carrier, allergic rhinitis, Hypertension, pericardial effusion, MI, LAFB, hyperlipidemia, thrombocytopenia, GERD, melanoma. Patient of Dr. Lamonte Sakai, last seen 11/19 and restarted on Stiolto. Continues Duonebs q6 hours. Needs repeat Chest CT in February.   Recent [place] En[place]ters: 03/16/2018 Roma Kayser NP Patient presents today for a one-month follow-up office visit.  Accompanied by his son.  Patient states that his breathing is about the same.  He still has a cough, states that Mucinex helps a lot.  Reports no change with addition of Stiolto.  He can't remember if he has been using nebulizers as prescribed. He is able to do some of his shopping, states that he has to take it slow. Son reports that he occasionally has to stop to catch his breath.  There are some barriers to inhaler use and medication management.  We will check his in-check dial flow meter to see if patient is an appropriate candidate for Stiolto Respimat inhaler.  Hx of several falls at home and out of bed. Recently started using a rolling walker. Has difficulty with stairs. Looking into getting a chair lift in the house, they will contact their PCP for prescription.   03/23/2018  Patient presents today for 1 week follow-up. Stiolto changed to Anoro to enhance compliance and ease of delivery. Patient had significant wheezing throughout his lung fields, given RX for prednisone taper. Referred for home health to help manage care and medication reconciliation.   Patient is accompanied by his wife today. Acute  complaints of sore throat and back pain. Continues to have dyspnea on exertion and cough with mucus production. It is still difficult to understand which inhaler he is currently taking, if he's using them appropriately and if he is using his nebulizer at home.   Allergies  Allergen Reactions  . Sertraline Hcl     Patient does not remember the reaction    Immunization History  Administered Date(s) Administered  . Influenza Split 12/07/2010  . Influenza Whole 01/05/2010  . Influenza, High Dose Seasonal PF 01/06/2016, 01/13/2017  . Influenza,inj,Quad PF,6+ Mos 02/08/2013, 12/28/2014  . Influenza-Unspecified 02/13/2012  . Pneumococcal Conjugate-13 12/18/2015  . Pneumococcal Polysaccharide-23 01/05/2010    Past Medical History:  Diagnosis Date  . Anxiety    takes wellbutrin daily  . Arthritis    knees  . COPD (chronic obstructive pulmonary disease) ([place])   . Diverticulosis   . Emphysema of lung ([place])   . GERD (gastroesophageal reflux disease)   . Hearing loss   . Hx of radiation therapy 09/29/11;10/01/11;10/03/11;10/06/11;10/08/11;   RLLlung,50Gy/13fx - SBRT  . Hyperlipidemia   . Hypertension   . Hypokinesia    MILD INFERIOR  . IHD (ischemic heart disease)    Remote MI in 2001 with stent to the LCX  . Lactose intolerance   . Non-small cell carcinoma of lung ([place])    Dx in October of 2010  . Normal nuclear stress test 2009   EF 63%. No ischemia. 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  . Pericardial effusion July 2011   s/p pericardial window; last echo in August 2012 with  minimal effusion  . Pleural effusion, left   . SOB (shortness of breath)    Chronic with known fibrosis  . Status post chemotherapy 06/04/2009 -  09/06/2009    4 cycles of Cisplatin and Gemcitabine with Neulasta Support   . Vertigo    hx. of- ambulates with cane for safety.    Tobacco History: Social History   Tobacco Use  Smoking Status Former Smoker  . Packs/day: 3.00  . Years: 40.00  . Pack years: 120.00  . Types:  Cigarettes  . Last attempt to quit: 04/07/1994  . Years since quitting: 23.9  Smokeless Tobacco Never Used   Counseling given: Not Answered   Outpatient Medications Prior to Visit  Medication Sig Dispense Refill  . albuterol (PROVENTIL HFA;VENTOLIN HFA) 108 (90 Base) MCG/ACT inhaler Inhale 2 puffs into the lungs every 6 (six) hours as needed for wheezing or shortness of breath. 1 Inhaler 2  . albuterol (PROVENTIL) (2.5 MG/3ML) 0.083% nebulizer solution Take 3 mLs (2.5 mg total) by nebulization every 4 (four) hours as needed for wheezing. 75 mL 12  . aspirin EC 81 MG tablet Take 81 mg by mouth daily.    Marland Kitchen atorvastatin (LIPITOR) 20 MG tablet TAKE 1 TABLET BY MOUTH DAILY (Patient taking differently: Take 20 mg by mouth once a day) 90 tablet 1  . buPROPion (WELLBUTRIN SR) 150 MG 12 hr tablet Take 150 mg by mouth daily.     . Cholecalciferol (VITAMIN D3) 2000 units TABS Take 2,000 Units by mouth 2 (two) times daily.     . furosemide (LASIX) 40 MG tablet Take 1 tablet (40 mg total) by mouth daily. 90 tablet 0  . guaiFENesin (MUCINEX) 600 MG 12 hr tablet Take 1 tablet (600 mg total) by mouth 2 (two) times daily. 20 tablet 0  . HYDROcodone-acetaminophen (NORCO/VICODIN) 5-325 MG tablet TAKE 1/2 TABLET PO EVERY 6 HOURS  0  . ipratropium-albuterol (DUONEB) 0.5-2.5 (3) MG/3ML SOLN Take 3 mLs by nebulization every 6 (six) hours as needed.    . linaclotide (LINZESS) 290 MCG CAPS capsule Take 290 mcg by mouth 2 (two) times a week.    . loratadine (CLARITIN) 10 MG tablet Take 1 tablet (10 mg total) by mouth daily. 30 tablet 11  . magic mouthwash SOLN Take 5 mLs by mouth 3 (three) times daily.    . metoprolol tartrate (LOPRESSOR) 25 MG tablet TAKE 1/2 TABLET BY MOUTH TWICE DAILY 90 tablet 3  . Tamsulosin HCl (FLOMAX) 0.4 MG CAPS Take 0.4 mg by mouth daily with supper.     . temazepam (RESTORIL) 30 MG capsule Take 30 mg by mouth at bedtime    . tiZANidine (ZANAFLEX) 4 MG tablet TK 1 T PO TID FOR 7 DAYS PRN  0    . traMADol (ULTRAM) 50 MG tablet TAKE 1 TO 2 TABLETS PO TID PRN P  0  . umeclidinium-vilanterol (ANORO ELLIPTA) 62.5-25 MCG/INH AEPB Inhale 1 puff into the lungs daily. 60 each 1  . venlafaxine XR (EFFEXOR-XR) 150 MG 24 hr capsule Take 150 mg by mouth daily.   1  . levofloxacin (LEVAQUIN) 500 MG tablet Take 1 tablet (500 mg total) by mouth daily. 10 tablet 0  . predniSONE (DELTASONE) 10 MG tablet Take 10 mg by mouth daily with breakfast.    . predniSONE (DELTASONE) 10 MG tablet Take 4 tabs po daily x 2 days; then 3 tabs for 2 days; then 2 tabs for 2 days; then 1 tab for 2 days 20  tablet 0   No facility-administered medications prior to visit.     Review of Systems  Review of Systems  Constitutional: Negative.   HENT: Positive for congestion and postnasal drip.   Respiratory: Positive for cough and shortness of breath. Negative for apnea, choking, chest tightness, wheezing and stridor.   Cardiovascular: Negative.     Physical Exam  BP 124/74 (BP Location: Left Arm, Cuff Size: Normal)   Pulse 74   Temp 97.7 F (36.5 C)   Ht 5\' 10"  (1.778 m)   Wt 194 lb (88 kg)   SpO2 95%   BMI 27.84 kg/m  Physical Exam Constitutional:      Appearance: Normal appearance.  HENT:     Head: Normocephalic and atraumatic.     Mouth/Throat:     Mouth: Mucous membranes are moist.     Pharynx: Oropharynx is clear.  Eyes:     Extraocular Movements: Extraocular movements intact.     Pupils: Pupils are equal, round, and reactive to light.  Neck:     Musculoskeletal: Normal range of motion and neck supple.  Cardiovascular:     Rate and Rhythm: Normal rate and regular rhythm.  Pulmonary:     Effort: Pulmonary effort is normal. No respiratory distress.     Breath sounds: No stridor. Wheezing present.     Comments: Faint expiratory wheeze in lung fields. Forced upper airway cough/wheeze  Musculoskeletal: Normal range of motion.  Skin:    General: Skin is warm and dry.  Neurological:     General:  No focal deficit present.     Mental Status: He is alert. Mental status is at baseline.  Psychiatric:        Mood and Affect: Mood normal.        Behavior: Behavior normal.        Thought Content: Thought content normal.        Judgment: Judgment normal.      Lab Results:  CBC    Component Value Date/Time   WBC 7.3 12/15/2017 1020   WBC 8.2 11/05/2016 0352   RBC 4.41 12/15/2017 1020   HGB 13.5 12/15/2017 1020   HGB 15.2 02/02/2017 0842   HGB 14.6 07/18/2013 0851   HCT 42.2 12/15/2017 1020   HCT 46.0 02/02/2017 0842   HCT 44.8 07/18/2013 0851   PLT 239 12/15/2017 1020   PLT 290 02/02/2017 0842   PLT 259 06/24/2016 1129   MCV 95.7 12/15/2017 1020   MCV 93 02/02/2017 0842   MCV 88.4 07/18/2013 0851   MCH 30.6 12/15/2017 1020   MCHC 32.0 12/15/2017 1020   RDW 13.5 12/15/2017 1020   RDW 14.8 02/02/2017 0842   RDW 14.9 (H) 07/18/2013 0851   LYMPHSABS 1.5 12/15/2017 1020   LYMPHSABS 1.1 02/02/2017 0842   LYMPHSABS 0.7 (L) 07/18/2013 0851   MONOABS 0.8 12/15/2017 1020   MONOABS 0.6 07/18/2013 0851   EOSABS 0.1 12/15/2017 1020   EOSABS 0.1 02/02/2017 0842   BASOSABS 0.0 12/15/2017 1020   BASOSABS 0.0 02/02/2017 0842   BASOSABS 0.0 07/18/2013 0851    BMET    Component Value Date/Time   NA 141 12/15/2017 1020   NA 142 07/29/2017 1153   NA 144 02/02/2017 0842   NA 140 12/01/2016 0957   K 4.2 12/15/2017 1020   K 3.8 02/02/2017 0842   K 4.5 12/01/2016 0957   CL 107 12/15/2017 1020   CL 105 02/02/2017 0842   CL 104 08/27/2012 1421   CO2  30 12/15/2017 1020   CO2 29 02/02/2017 0842   CO2 24 12/01/2016 0957   GLUCOSE 87 12/15/2017 1020   GLUCOSE 105 02/02/2017 0842   GLUCOSE 72 08/27/2012 1421   BUN 20 12/15/2017 1020   BUN 22 07/29/2017 1153   BUN 13 02/02/2017 0842   BUN 21.2 12/01/2016 0957   CREATININE 1.30 (H) 12/15/2017 1020   CREATININE 1.1 02/02/2017 0842   CREATININE 1.1 12/01/2016 0957   CALCIUM 9.0 12/15/2017 1020   CALCIUM 9.5 02/02/2017 0842    CALCIUM 10.2 12/01/2016 0957   GFRNONAA 62 07/29/2017 1153   GFRNONAA >60 06/18/2017 0930   GFRAA 72 07/29/2017 1153   GFRAA >60 06/18/2017 0930    BNP    Component Value Date/Time   BNP 124.6 (H) 10/30/2015 1131    ProBNP    Component Value Date/Time   PROBNP 525 (H) 07/29/2017 1153    Imaging: Dg Chest 2 View  Result Date: 03/23/2018 CLINICAL DATA:  83 year old with six-month history of cough, chest congestion and shortness of breath. Former smoker. Personal history of LEFT UPPER LOBE and RIGHT LOWER LOBE lung cancer. EXAM: CHEST - 2 VIEW COMPARISON:  PET-CT 02/03/2018. CT chest 01/26/2018 and earlier. Chest x-ray 08/25/2017 and earlier. FINDINGS: RIGHT jugular Port-A-Cath tip in the LOWER SVC, unchanged. Cardiac silhouette mildly enlarged, unchanged. Thoracic aorta tortuous and atherosclerotic, unchanged. Enlarged central pulmonary arteries, unchanged. Post treatment pleural scarring involving the LEFT hemithorax, unchanged. Parenchymal scarring in the residual aerated LEFT lung, unchanged. Post radiation parenchymal scarring involving the RIGHT LOWER LOBE, unchanged. No new pulmonary parenchymal abnormalities. Mild degenerative changes involving the thoracic and UPPER lumbar spine with a compression fracture of the LOWER endplate of L1 which is new since the prior PET-CT. IMPRESSION: 1. No acute cardiopulmonary disease. 2. Post treatment changes involving the LEFT hemithorax, stable. 3. Post radiation parenchymal scarring involving the RIGHT LOWER LOBE, stable. 4. Compression fracture of the LOWER endplate of L1 which is new since the prior PET-CT 02/03/2018. Electronically Signed   By: Evangeline Dakin M.D.   On: 03/23/2018 12:07   Dg Lumbar Spine 2-3 Views  Result Date: 03/08/2018 CLINICAL DATA:  Low back pain after fall. EXAM: LUMBAR SPINE - 2-3 VIEW COMPARISON:  CT scan of November 04, 2016. FINDINGS: No spondylolisthesis is noted. Interval development of mild wedge compression  deformity of T12 vertebral body is noted concerning for fracture of indeterminate age. Moderate degenerative disc disease is noted at L3-4 and L4-5. IMPRESSION: Interval development of mild wedge compression deformity of T12 vertebral body concerning for fracture of indeterminate age. If patient is symptomatic in this area, MRI may be performed for further evaluation. Electronically Signed   By: Marijo Conception, M.D.   On: 03/08/2018 17:59     Assessment & Plan:   83 year male, hx severe COPD. Still a lot of confusion about medications. Wife was unable to provide insight today unfortunately. Unclear if Anoro has helped at all or if he is using it appropriately. From what I can tell, he tried it once or twice but did not find it helpful so he resumed Stiolto. He needs to use it for a longer duration to see benefit. Needs home health eval for medication reconciliation and manage care. I do not think adding additional medication or changing inhaler regimen today will be benefical. For now we need to maximize current therapy.   Plan: We will check CXR d/t complaints of back pain. Continue Anoro 1 puff daily and prednisone  10mg  daily. Encourage patient use prn albuterol nebulizer 2-3 times a day. Consider Trelegy if we can confidently say he is taking prescribed medication appropriately and still having dyspnea symptoms.  FU in 2 weeks.   COPD (chronic obstructive pulmonary disease) with emphysema ([place]) - Stiolto changed to Anoro on 03/16/18 to enhance compliance and ease of medication delivery - Instructed for patient to use Albuterol nebulizer 2-3 times a day for shortness of breath/wheezing - Continues prednisone 10mg  daily and Mucinex twice daily - Add flutter valve three times a day for congestion  - CXR showed no acute cardiopulmonary disease  - Needs home health eval  - FU in 2 weeks    Back pain - Complains or right lower back pain - CXR showed new compression fracture of the LOWER endplate  of L1 - Patient needs to follow-up with PCP    Martyn Ehrich, NP 03/23/2018

## 2018-03-23 NOTE — Assessment & Plan Note (Addendum)
-   Stiolto changed to [employer] on 03/16/18 to enhance compliance and ease of medication delivery - Instructed for patient to use Albuterol nebulizer 2-3 times a day for shortness of breath/wheezing - Continues prednisone 10mg  daily and Mucinex twice daily - Add flutter valve three times a day for congestion  - CXR showed no acute cardiopulmonary disease  - Needs home health eval  - FU in 2 weeks

## 2018-03-30 NOTE — Progress Notes (Signed)
LMTCB

## 2018-04-05 DIAGNOSIS — G479 Sleep disorder, unspecified: Secondary | ICD-10-CM | POA: Diagnosis not present

## 2018-04-05 DIAGNOSIS — R112 Nausea with vomiting, unspecified: Secondary | ICD-10-CM | POA: Diagnosis not present

## 2018-04-05 DIAGNOSIS — S22000A Wedge compression fracture of unspecified thoracic vertebra, initial encounter for closed fracture: Secondary | ICD-10-CM | POA: Diagnosis not present

## 2018-04-06 ENCOUNTER — Other Ambulatory Visit: Payer: Self-pay | Admitting: Internal Medicine

## 2018-04-06 ENCOUNTER — Ambulatory Visit (INDEPENDENT_AMBULATORY_CARE_PROVIDER_SITE_OTHER): Payer: Medicare Other | Admitting: Primary Care

## 2018-04-06 ENCOUNTER — Encounter: Payer: Self-pay | Admitting: Primary Care

## 2018-04-06 VITALS — BP 140/80 | HR 111 | Ht 70.0 in | Wt 180.4 lb

## 2018-04-06 DIAGNOSIS — S22000A Wedge compression fracture of unspecified thoracic vertebra, initial encounter for closed fracture: Secondary | ICD-10-CM

## 2018-04-06 DIAGNOSIS — J438 Other emphysema: Secondary | ICD-10-CM | POA: Diagnosis not present

## 2018-04-06 DIAGNOSIS — J449 Chronic obstructive pulmonary disease, unspecified: Secondary | ICD-10-CM | POA: Diagnosis not present

## 2018-04-06 MED ORDER — UMECLIDINIUM-VILANTEROL 62.5-25 MCG/INH IN AEPB: 1.0000 | INHALATION_SPRAY | Freq: Every day | RESPIRATORY_TRACT | 1 refills | Status: DC

## 2018-04-06 NOTE — Progress Notes (Signed)
@Patient  ID: [name] Fields, male    DOB: Jul 26, 1934, 83 y.o.   MRN: [id number]  Chief Complaint  Patient presents with  . Follow-up    breathing easier     Referring provider: [name], MD  HPI: 83 year old male, former smoker quit in 1996. PMH significant for severe COPD with emphysema, non-small cell lung cancer RLL, pulmonary nodules, alpha-1 antitrypsin deficiency carrier, allergic rhinitis, Hypertension, pericardial effusion, MI, LAFB, hyperlipidemia, thrombocytopenia, GERD, melanoma. Patient of Dr. Lamonte Sakai, last seen 11/19 and restarted on Stiolto. Continues Duonebs q6 hours. Needs repeat Chest CT in February.   Recent [place]: 03/16/2018 Roma Kayser NP Patient presents today for a one-month follow-up office visit.  Accompanied by his son.  Patient states that his breathing is about the same.  He still has a cough, states that Mucinex helps a lot.  Reports no change with addition of Stiolto.  He can't remember if he has been using nebulizers as prescribed. He is able to do some of his shopping, states that he has to take it slow. Son reports that he occasionally has to stop to catch his breath. There are some barriers to inhaler use and medication management.  We will check his in-check dial flow meter to see if patient is an appropriate candidate for Stiolto Respimat inhaler.  Hx of several falls at home and out of bed. Recently started using a rolling walker. Has difficulty with stairs. Looking into getting a chair lift in the house, they will contact their PCP for prescription.   03/23/2018 Roma Kayser NP Patient presents today for 1 week follow-up. Stiolto changed to Anoro to enhance compliance and ease of delivery. Patient had significant wheezing throughout his lung fields, given RX for prednisone taper. Referred for home health to help manage care and medication reconciliation. Patient is accompanied by his wife today. Acute complaints of sore throat and back  pain. Continues to have dyspnea on exertion and cough with mucus production. It is still difficult to understand which inhaler he is currently taking, if he's using them appropriately and if he is using his nebulizer at home.   04/06/2018 Patient presents today for 2 week follow-up. Accompanied by his wife. Stiolto changed to Anoro to enhance compliance and ease of medication delivery. Started on Albuterol nebulizer 2-3 times a day for sob/wheezing. Added flutter valve for congestion and azelastine nasal spray. CXR showed no acute cardiopulmonary disease, new compression fracture to L1. Still needs home health eval. Feels better, states that he is breathing easier. Liking Anoro better than Stiolto. Not taking daily prednisone. Seen over the weekend by provider for N/V but doing better. HR is mildly elevated but regular. Encourage oral re-hydration. Denies cough or wheeze. Afebrile.   Allergies  Allergen Reactions  . Sertraline Hcl     Patient does not remember the reaction    Immunization History  Administered Date(s) Administered  . Influenza Split 12/07/2010  . Influenza Whole 01/05/2010  . Influenza, High Dose Seasonal PF 01/06/2016, 01/13/2017  . Influenza,inj,Quad PF,6+ Mos 02/08/2013, 12/28/2014  . Influenza-Unspecified 02/13/2012  . Pneumococcal Conjugate-13 12/18/2015  . Pneumococcal Polysaccharide-23 01/05/2010    Past Medical History:  Diagnosis Date  . Anxiety    takes wellbutrin daily  . Arthritis    knees  . COPD (chronic obstructive pulmonary disease) ([place])   . Diverticulosis   . Emphysema of lung ([place])   . GERD (gastroesophageal reflux disease)   . Hearing loss   . Hx  of radiation therapy 09/29/11;10/01/11;10/03/11;10/06/11;10/08/11;   RLLlung,50Gy/90fx - SBRT  . Hyperlipidemia   . Hypertension   . Hypokinesia    MILD INFERIOR  . IHD (ischemic heart disease)    Remote MI in 2001 with stent to the LCX  . Lactose intolerance   . Non-small cell carcinoma of lung ([place])      Dx in October of 2010  . Normal nuclear stress test 2009   EF 63%. No ischemia. 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  . Pericardial effusion July 2011   s/p pericardial window; last echo in August 2012 with minimal effusion  . Pleural effusion, left   . SOB (shortness of breath)    Chronic with known fibrosis  . Status post chemotherapy 06/04/2009 -  09/06/2009    4 cycles of Cisplatin and Gemcitabine with Neulasta Support   . Vertigo    hx. of- ambulates with cane for safety.    Tobacco History: Social History   Tobacco Use  Smoking Status Former Smoker  . Packs/day: 3.00  . Years: 40.00  . Pack years: 120.00  . Types: Cigarettes  . Last attempt to quit: 04/07/1994  . Years since quitting: 24.0  Smokeless Tobacco Never Used   Counseling given: Not Answered   Outpatient Medications Prior to Visit  Medication Sig Dispense Refill  . albuterol (PROVENTIL HFA;VENTOLIN HFA) 108 (90 Base) MCG/ACT inhaler Inhale 2 puffs into the lungs every 6 (six) hours as needed for wheezing or shortness of breath. 1 Inhaler 2  . albuterol (PROVENTIL) (2.5 MG/3ML) 0.083% nebulizer solution Take 3 mLs (2.5 mg total) by nebulization every 4 (four) hours as needed for wheezing. 75 mL 12  . aspirin EC 81 MG tablet Take 81 mg by mouth daily.    Marland Kitchen atorvastatin (LIPITOR) 20 MG tablet TAKE 1 TABLET BY MOUTH DAILY (Patient taking differently: Take 20 mg by mouth once a day) 90 tablet 1  . Azelastine HCl 137 MCG/SPRAY SOLN Place 2 puffs into the nose 2 (two) times daily. 1 Bottle 2  . buPROPion (WELLBUTRIN SR) 150 MG 12 hr tablet Take 150 mg by mouth daily.     . Cholecalciferol (VITAMIN D3) 2000 units TABS Take 2,000 Units by mouth 2 (two) times daily.     . furosemide (LASIX) 40 MG tablet Take 1 tablet (40 mg total) by mouth daily. 90 tablet 0  . guaiFENesin (MUCINEX) 600 MG 12 hr tablet Take 1 tablet (600 mg total) by mouth 2 (two) times daily. 20 tablet 0  . HYDROcodone-acetaminophen (NORCO/VICODIN) 5-325 MG tablet  TAKE 1/2 TABLET PO EVERY 6 HOURS  0  . ipratropium-albuterol (DUONEB) 0.5-2.5 (3) MG/3ML SOLN Take 3 mLs by nebulization every 6 (six) hours as needed.    . linaclotide (LINZESS) 290 MCG CAPS capsule Take 290 mcg by mouth 2 (two) times a week.    . loratadine (CLARITIN) 10 MG tablet Take 1 tablet (10 mg total) by mouth daily. 30 tablet 11  . magic mouthwash SOLN Take 5 mLs by mouth 3 (three) times daily.    . metoprolol tartrate (LOPRESSOR) 25 MG tablet TAKE 1/2 TABLET BY MOUTH TWICE DAILY 90 tablet 3  . Tamsulosin HCl (FLOMAX) 0.4 MG CAPS Take 0.4 mg by mouth daily with supper.     . temazepam (RESTORIL) 30 MG capsule Take 30 mg by mouth at bedtime    . tiZANidine (ZANAFLEX) 4 MG tablet TK 1 T PO TID FOR 7 DAYS PRN  0  . traMADol (ULTRAM) 50 MG tablet  TAKE 1 TO 2 TABLETS PO TID PRN P  0  . venlafaxine XR (EFFEXOR-XR) 150 MG 24 hr capsule Take 150 mg by mouth daily.   1  . predniSONE (DELTASONE) 10 MG tablet Take 10 mg by mouth daily with breakfast.    . umeclidinium-vilanterol (ANORO ELLIPTA) 62.5-25 MCG/INH AEPB Inhale 1 puff into the lungs daily. 60 each 1  . umeclidinium-vilanterol (ANORO ELLIPTA) 62.5-25 MCG/INH AEPB Inhale 1 puff into the lungs daily. 60 each 1   No facility-administered medications prior to visit.       Review of Systems  Review of Systems  Constitutional: Negative.   HENT: Negative.   Respiratory: Negative for cough, chest tightness, shortness of breath and wheezing.   Gastrointestinal: Positive for nausea.     Physical Exam  BP 140/80 (BP Location: Right Arm, Cuff Size: Normal)   Pulse (!) 111   Ht 5\' 10"  (1.778 m)   Wt 180 lb 6.4 oz (81.8 kg)   SpO2 93%   BMI 25.88 kg/m  Physical Exam Constitutional:      Appearance: Normal appearance.  HENT:     Head: Normocephalic and atraumatic.     Mouth/Throat:     Mouth: Mucous membranes are moist.     Pharynx: Oropharynx is clear.  Eyes:     Extraocular Movements: Extraocular movements intact.      Pupils: Pupils are equal, round, and reactive to light.  Cardiovascular:     Rate and Rhythm: Regular rhythm. Tachycardia present.  Pulmonary:     Effort: Pulmonary effort is normal. No respiratory distress.     Breath sounds: No rhonchi.  Musculoskeletal: Normal range of motion.  Skin:    General: Skin is warm and dry.  Neurological:     General: No focal deficit present.     Mental Status: He is alert and oriented to person, place, and time. Mental status is at baseline.  Psychiatric:        Mood and Affect: Mood normal.        Behavior: Behavior normal.        Thought Content: Thought content normal.        Judgment: Judgment normal.      Lab Results:  CBC    Component Value Date/Time   WBC 7.3 12/15/2017 1020   WBC 8.2 11/05/2016 0352   RBC 4.41 12/15/2017 1020   HGB 13.5 12/15/2017 1020   HGB 15.2 02/02/2017 0842   HGB 14.6 07/18/2013 0851   HCT 42.2 12/15/2017 1020   HCT 46.0 02/02/2017 0842   HCT 44.8 07/18/2013 0851   PLT 239 12/15/2017 1020   PLT 290 02/02/2017 0842   PLT 259 06/24/2016 1129   MCV 95.7 12/15/2017 1020   MCV 93 02/02/2017 0842   MCV 88.4 07/18/2013 0851   MCH 30.6 12/15/2017 1020   MCHC 32.0 12/15/2017 1020   RDW 13.5 12/15/2017 1020   RDW 14.8 02/02/2017 0842   RDW 14.9 (H) 07/18/2013 0851   LYMPHSABS 1.5 12/15/2017 1020   LYMPHSABS 1.1 02/02/2017 0842   LYMPHSABS 0.7 (L) 07/18/2013 0851   MONOABS 0.8 12/15/2017 1020   MONOABS 0.6 07/18/2013 0851   EOSABS 0.1 12/15/2017 1020   EOSABS 0.1 02/02/2017 0842   BASOSABS 0.0 12/15/2017 1020   BASOSABS 0.0 02/02/2017 0842   BASOSABS 0.0 07/18/2013 0851    BMET    Component Value Date/Time   NA 141 12/15/2017 1020   NA 142 07/29/2017 1153   NA 144 02/02/2017 0842  NA 140 12/01/2016 0957   K 4.2 12/15/2017 1020   K 3.8 02/02/2017 0842   K 4.5 12/01/2016 0957   CL 107 12/15/2017 1020   CL 105 02/02/2017 0842   CL 104 08/27/2012 1421   CO2 30 12/15/2017 1020   CO2 29 02/02/2017  0842   CO2 24 12/01/2016 0957   GLUCOSE 87 12/15/2017 1020   GLUCOSE 105 02/02/2017 0842   GLUCOSE 72 08/27/2012 1421   BUN 20 12/15/2017 1020   BUN 22 07/29/2017 1153   BUN 13 02/02/2017 0842   BUN 21.2 12/01/2016 0957   CREATININE 1.30 (H) 12/15/2017 1020   CREATININE 1.1 02/02/2017 0842   CREATININE 1.1 12/01/2016 0957   CALCIUM 9.0 12/15/2017 1020   CALCIUM 9.5 02/02/2017 0842   CALCIUM 10.2 12/01/2016 0957   GFRNONAA 62 07/29/2017 1153   GFRNONAA >60 06/18/2017 0930   GFRAA 72 07/29/2017 1153   GFRAA >60 06/18/2017 0930    BNP    Component Value Date/Time   BNP 124.6 (H) 10/30/2015 1131    ProBNP    Component Value Date/Time   PROBNP 525 (H) 07/29/2017 1153    Imaging: Dg Chest 2 View  Result Date: 03/23/2018 CLINICAL DATA:  83 year old with six-month history of cough, chest congestion and shortness of breath. Former smoker. Personal history of LEFT UPPER LOBE and RIGHT LOWER LOBE lung cancer. EXAM: CHEST - 2 VIEW COMPARISON:  PET-CT 02/03/2018. CT chest 01/26/2018 and earlier. Chest x-ray 08/25/2017 and earlier. FINDINGS: RIGHT jugular Port-A-Cath tip in the LOWER SVC, unchanged. Cardiac silhouette mildly enlarged, unchanged. Thoracic aorta tortuous and atherosclerotic, unchanged. Enlarged central pulmonary arteries, unchanged. Post treatment pleural scarring involving the LEFT hemithorax, unchanged. Parenchymal scarring in the residual aerated LEFT lung, unchanged. Post radiation parenchymal scarring involving the RIGHT LOWER LOBE, unchanged. No new pulmonary parenchymal abnormalities. Mild degenerative changes involving the thoracic and UPPER lumbar spine with a compression fracture of the LOWER endplate of L1 which is new since the prior PET-CT. IMPRESSION: 1. No acute cardiopulmonary disease. 2. Post treatment changes involving the LEFT hemithorax, stable. 3. Post radiation parenchymal scarring involving the RIGHT LOWER LOBE, stable. 4. Compression fracture of the LOWER  endplate of L1 which is new since the prior PET-CT 02/03/2018. Electronically Signed   By: Evangeline Dakin M.D.   On: 03/23/2018 12:07   Dg Lumbar Spine 2-3 Views  Result Date: 03/08/2018 CLINICAL DATA:  Low back pain after fall. EXAM: LUMBAR SPINE - 2-3 VIEW COMPARISON:  CT scan of November 04, 2016. FINDINGS: No spondylolisthesis is noted. Interval development of mild wedge compression deformity of T12 vertebral body is noted concerning for fracture of indeterminate age. Moderate degenerative disc disease is noted at L3-4 and L4-5. IMPRESSION: Interval development of mild wedge compression deformity of T12 vertebral body concerning for fracture of indeterminate age. If patient is symptomatic in this area, MRI may be performed for further evaluation. Electronically Signed   By: Marijo Conception, M.D.   On: 03/08/2018 17:59     Assessment & Plan:   COPD (chronic obstructive pulmonary disease) with emphysema ([place]) - Doing better, appears more compliant with medication regimen  - Continue Anoro 1 puff daily  - Use Albuterol nebulizer 2-3 times a day for shortness of breath/wheezing - FU in 6-8 weeks   Martyn Ehrich, NP 04/06/2018

## 2018-04-06 NOTE — Patient Instructions (Signed)
Continue Anoro- 1 puff daily (everyday) Use Albuterol nebulizer 2-3 times a day for shortness of breath/wheezing  Aim for 6-8 glasses of water daily  Follow up in 6-8 weeks with Dr. Lamonte Sakai

## 2018-04-06 NOTE — Assessment & Plan Note (Addendum)
-   Doing better, appears more compliant with medication regimen  - Continue Anoro 1 puff daily  - Use Albuterol nebulizer 2-3 times a day for shortness of breath/wheezing - FU in 6-8 weeks

## 2018-04-13 ENCOUNTER — Telehealth: Payer: Self-pay | Admitting: Emergency Medicine

## 2018-04-13 NOTE — Telephone Encounter (Signed)
Spoke with pt's sister and she was advised that there is no documentation of a call from [place] to the pt. She understood and nothing further is needed.

## 2018-04-14 ENCOUNTER — Inpatient Hospital Stay: Admission: RE | Admit: 2018-04-14 | Payer: Medicare Other | Source: Ambulatory Visit

## 2018-04-15 DIAGNOSIS — M4854XA Collapsed vertebra, not elsewhere classified, thoracic region, initial encounter for fracture: Secondary | ICD-10-CM | POA: Diagnosis not present

## 2018-04-15 DIAGNOSIS — J449 Chronic obstructive pulmonary disease, unspecified: Secondary | ICD-10-CM | POA: Diagnosis not present

## 2018-04-15 DIAGNOSIS — K5900 Constipation, unspecified: Secondary | ICD-10-CM | POA: Diagnosis not present

## 2018-04-16 ENCOUNTER — Other Ambulatory Visit: Payer: Self-pay | Admitting: Internal Medicine

## 2018-04-19 ENCOUNTER — Other Ambulatory Visit: Payer: Self-pay | Admitting: Internal Medicine

## 2018-04-20 ENCOUNTER — Other Ambulatory Visit: Payer: Self-pay | Admitting: Internal Medicine

## 2018-04-23 ENCOUNTER — Other Ambulatory Visit: Payer: Self-pay | Admitting: Internal Medicine

## 2018-04-23 DIAGNOSIS — S22080A Wedge compression fracture of T11-T12 vertebra, initial encounter for closed fracture: Secondary | ICD-10-CM

## 2018-04-26 ENCOUNTER — Other Ambulatory Visit: Payer: Medicare Other

## 2018-04-27 ENCOUNTER — Ambulatory Visit
Admission: RE | Admit: 2018-04-27 | Discharge: 2018-04-27 | Disposition: A | Discharge status: Home / Self Care | Payer: Medicare Other | Source: Ambulatory Visit | Attending: Internal Medicine | Admitting: Internal Medicine

## 2018-04-27 DIAGNOSIS — M5126 Other intervertebral disc displacement, lumbar region: Secondary | ICD-10-CM | POA: Diagnosis not present

## 2018-04-27 DIAGNOSIS — M5127 Other intervertebral disc displacement, lumbosacral region: Secondary | ICD-10-CM | POA: Diagnosis not present

## 2018-04-27 DIAGNOSIS — S22080A Wedge compression fracture of T11-T12 vertebra, initial encounter for closed fracture: Secondary | ICD-10-CM

## 2018-04-27 DIAGNOSIS — M48061 Spinal stenosis, lumbar region without neurogenic claudication: Secondary | ICD-10-CM | POA: Diagnosis not present

## 2018-04-27 DIAGNOSIS — M47816 Spondylosis without myelopathy or radiculopathy, lumbar region: Secondary | ICD-10-CM | POA: Diagnosis not present

## 2018-05-04 ENCOUNTER — Inpatient Hospital Stay: Admission: RE | Admit: 2018-05-04 | Payer: Medicare Other | Source: Ambulatory Visit

## 2018-05-07 ENCOUNTER — Ambulatory Visit
Admission: RE | Admit: 2018-05-07 | Discharge: 2018-05-07 | Disposition: A | Discharge status: Home / Self Care | Payer: Medicare Other | Source: Ambulatory Visit | Attending: Internal Medicine | Admitting: Internal Medicine

## 2018-05-07 DIAGNOSIS — S22000A Wedge compression fracture of unspecified thoracic vertebra, initial encounter for closed fracture: Secondary | ICD-10-CM

## 2018-05-07 DIAGNOSIS — S22089A Unspecified fracture of T11-T12 vertebra, initial encounter for closed fracture: Secondary | ICD-10-CM | POA: Diagnosis not present

## 2018-05-07 DIAGNOSIS — Z85118 Personal history of other malignant neoplasm of bronchus and lung: Secondary | ICD-10-CM | POA: Diagnosis not present

## 2018-05-07 NOTE — Consult Note (Signed)
Chief Complaint: Patient was seen in consultation today for evaluation of a T12 inferior endplate fracture at the request of Gates,Nain  Referring Physician(s): Gates,Koltin  History of Present Illness: [name] Fields is a 84 y.o. male who presents with low back pain after a fall 6-8 weeks ago.  He reports that he fell into a cabinet.  He has had low back pain since.  The pain is central without significant radiation into the lower extremities.  The pain is greatest upon first getting up from a recumbent position.  He rates the pain as 8/10.  He is treating the pain with over the counter medications.  A Rolland-Morris questionnaire was administered.  He scored 19/24.  He is now ambulating with a walker due to the pain.  He reports no significant improvement of his pain over the last several weeks.     Past Medical History:  Diagnosis Date  . Anxiety    takes wellbutrin daily  . Arthritis    knees  . COPD (chronic obstructive pulmonary disease) ([place])   . Diverticulosis   . Emphysema of lung ([place])   . GERD (gastroesophageal reflux disease)   . Hearing loss   . Hx of radiation therapy 09/29/11;10/01/11;10/03/11;10/06/11;10/08/11;   RLLlung,50Gy/40fx - SBRT  . Hyperlipidemia   . Hypertension   . Hypokinesia    MILD INFERIOR  . IHD (ischemic heart disease)    Remote MI in 2001 with stent to the LCX  . Lactose intolerance   . Non-small cell carcinoma of lung ([place])    Dx in October of 2010  . Normal nuclear stress test 2009   EF 63%. No ischemia. 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  . Pericardial effusion July 2011   s/p pericardial window; last echo in August 2012 with minimal effusion  . Pleural effusion, left   . SOB (shortness of breath)    Chronic with known fibrosis  . Status post chemotherapy 06/04/2009 -  09/06/2009    4 cycles of Cisplatin and Gemcitabine with Neulasta Support   . Vertigo    hx. of- ambulates with cane for safety.    Past Surgical History:  Procedure Laterality  Date  . CARDIAC CATHETERIZATION  07/11/2003   EF 60%, REPEAT, WHICH SHOWED 30% PROXIMAL NARROWING IN THE RIGHT  CORONARY ARTERY  . CARDIOVASCULAR STRESS TEST  05/2007   EF 63%  . CATARACT EXTRACTION, BILATERAL Bilateral   . COLONOSCOPY WITH PROPOFOL N/A 01/30/2015   Procedure: COLONOSCOPY WITH PROPOFOL;  Surgeon: Garlan Fair, MD;  Location: [facility];  Service: Endoscopy;  Laterality: N/A;  . CORONARY STENT PLACEMENT  07/1999   STENT TO THE LEFT CIRCUMFLEX  . HERNIA REPAIR     as an adult- R inguinal   . PERICARDIAL WINDOW  July 2011  . TRANSTHORACIC ECHOCARDIOGRAM  02/22/2010   EF 55-60%    Allergies: Sertraline hcl  Medications: Prior to Admission medications   Medication Sig Start Date End Date Taking? Authorizing Provider  albuterol (PROVENTIL HFA;VENTOLIN HFA) 108 (90 Base) MCG/ACT inhaler Inhale 2 puffs into the lungs every 6 (six) hours as needed for wheezing or shortness of breath. 11/05/16   Sheikh, Georgina Quint Latif, DO  albuterol (PROVENTIL) (2.5 MG/3ML) 0.083% nebulizer solution Take 3 mLs (2.5 mg total) by nebulization every 4 (four) hours as needed for wheezing. 11/05/16   Raiford Noble Latif, DO  aspirin EC 81 MG tablet Take 81 mg by mouth daily.    [provider]  atorvastatin (LIPITOR) 20 MG tablet  TAKE 1 TABLET BY MOUTH DAILY Patient taking differently: Take 20 mg by mouth once a day 10/05/14   Jerline Pain, MD  Azelastine HCl 137 MCG/SPRAY SOLN Place 2 puffs into the nose 2 (two) times daily. 03/23/18   Martyn Ehrich, NP  buPROPion (WELLBUTRIN SR) 150 MG 12 hr tablet Take 150 mg by mouth daily.     [provider]  Cholecalciferol (VITAMIN D3) 2000 units TABS Take 2,000 Units by mouth 2 (two) times daily.     [provider]  furosemide (LASIX) 40 MG tablet Take 1 tablet (40 mg total) by mouth daily. 07/22/17   Burtis Junes, NP  guaiFENesin (MUCINEX) 600 MG 12 hr tablet Take 1 tablet (600 mg total) by mouth 2 (two) times daily. 11/05/16    Raiford Noble Latif, DO  HYDROcodone-acetaminophen (NORCO/VICODIN) 5-325 MG tablet TAKE 1/2 TABLET PO EVERY 6 HOURS 03/08/18   [provider]  ipratropium-albuterol (DUONEB) 0.5-2.5 (3) MG/3ML SOLN Take 3 mLs by nebulization every 6 (six) hours as needed.    [provider]  linaclotide (LINZESS) 290 MCG CAPS capsule Take 290 mcg by mouth 2 (two) times a week.    [provider]  loratadine (CLARITIN) 10 MG tablet Take 1 tablet (10 mg total) by mouth daily. 01/27/18   Fenton Foy, NP  magic mouthwash SOLN Take 5 mLs by mouth 3 (three) times daily.    [provider]  metoprolol tartrate (LOPRESSOR) 25 MG tablet TAKE 1/2 TABLET BY MOUTH TWICE DAILY 08/07/17   Jerline Pain, MD  Tamsulosin HCl (FLOMAX) 0.4 MG CAPS Take 0.4 mg by mouth daily with supper.     [provider]  temazepam (RESTORIL) 30 MG capsule Take 30 mg by mouth at bedtime    [provider]  tiZANidine (ZANAFLEX) 4 MG tablet TK 1 T PO TID FOR 7 DAYS PRN 03/08/18   [provider]  traMADol (ULTRAM) 50 MG tablet TAKE 1 TO 2 TABLETS PO TID PRN P 03/05/18   [provider]  umeclidinium-vilanterol (ANORO ELLIPTA) 62.5-25 MCG/INH AEPB Inhale 1 puff into the lungs daily. 04/06/18   Martyn Ehrich, NP  venlafaxine XR (EFFEXOR-XR) 150 MG 24 hr capsule Take 150 mg by mouth daily.  12/04/14   [provider]     Family History  Problem Relation Age of Onset  . Lung cancer Father   . Stroke Sister   . Heart disease Neg Hx   . Anesthesia problems Neg Hx   . Lung disease Neg Hx     Social History   Socioeconomic History  . Marital status: Single    Spouse name: Not on file  . Number of children: Not on file  . Years of education: Not on file  . Highest education level: Not on file  Occupational History  . Occupation: retired    Fish farm manager: RETIRED    Comment: [employer]  Social Needs  . Financial resource strain: Not on file  . Food insecurity:     Worry: Not on file    Inability: Not on file  . Transportation needs:    Medical: Not on file    Non-medical: Not on file  Tobacco Use  . Smoking status: Former Smoker    Packs/day: 3.00    Years: 40.00    Pack years: 120.00    Types: Cigarettes    Last attempt to quit: 04/07/1994    Years since quitting: 24.0  . Smokeless tobacco:  Never Used  Substance and Sexual Activity  . Alcohol use: No    Alcohol/week: 0.0 standard drinks  . Drug use: No  . Sexual activity: Never  Lifestyle  . Physical activity:    Days per week: Not on file    Minutes per session: Not on file  . Stress: Not on file  Relationships  . Social connections:    Talks on phone: Not on file    Gets together: Not on file    Attends religious service: Not on file    Active member of club or organization: Not on file    Attends meetings of clubs or organizations: Not on file    Relationship status: Not on file  Other Topics Concern  . Not on file  Social History Narrative   Originally from [place]. Previously worked for [employer]. Currently has 2 dogs. No bird or mold exposure.      Review of Systems: A 12 point ROS discussed and pertinent positives are indicated in the HPI above.  All other systems are negative.  Review of Systems  Constitutional: Positive for fatigue.  Gastrointestinal: Positive for nausea.  Musculoskeletal: Positive for back pain.  Hematological: Bruises/bleeds easily.  All other systems reviewed and are negative.   Vital Signs: BP (!) 160/87   Pulse 85   Temp (!) 97.5 F (36.4 C)   Resp 18   Wt 81.6 kg   SpO2 95%   BMI 25.83 kg/m   Physical Exam Constitutional:      General: He is not in acute distress.    Appearance: Normal appearance. He is normal weight. He is not ill-appearing.  Musculoskeletal:     Lumbar back: He exhibits tenderness.     Comments: His back is tender at the thoraco lumbar junction and lower lumbar area to palpation.    Neurological:     Mental Status: He  is alert.     Motor: Motor function is intact.     Comments: Walks independently with walker     Imaging: Mr Lumbar Spine Wo Contrast  Result Date: 04/27/2018 CLINICAL DATA:  84 year old male status post fall at home 6 weeks ago. Central lumbar back pain, nonradiating. T12 compression fracture suspected on radiographs last month. History of treated lung cancer. EXAM: MRI LUMBAR SPINE WITHOUT CONTRAST TECHNIQUE: Multiplanar, multisequence MR imaging of the lumbar spine was performed. No intravenous contrast was administered. COMPARISON:  Lumbar radiographs 03/08/2018. PET-CT 02/03/2018. CT Abdomen and Pelvis 11/04/2016. FINDINGS: Segmentation:  Normal. Alignment: Stable lumbar lordosis since 2018. Minimal straightening/kyphosis about the T12 deformity. Vertebrae: T12 inferior endplate compression fracture with confluent vertebral body marrow edema. 35-40% loss of vertebral body height. Mild retropulsion of the posterior inferior endplate. No associated spinal stenosis. T12 pedicles and posterior elements appear intact. Preserved lumbar vertebral height and marrow signal. No other marrow edema. No other marrow edema. Intact visible sacrum and SI joints. No suspicious marrow lesion. Conus medullaris and cauda equina: Conus extends to the L1 level. No lower spinal cord or conus signal abnormality. Paraspinal and other soft tissues: Mild paraspinal soft tissue edema about the T12 level, slightly greater on the right and tracking caudally. Separate appearing patchy soft tissue edema about the right L3-L4 facet which is degenerated (series 4, image 13). No discrete soft tissue fluid collection. There is also degenerative signal in the L3-L4 interspinous ligament at that level. See additional details below. Negative visible abdominal viscera. Diverticulosis of the distal large bowel partially visible in the pelvis. Disc levels:  T11-T12: Negative. T12-L1: Mild retropulsion of the posterior inferior T12 endplate. No  spinal stenosis, but there is mild T12 foraminal stenosis greater on the left. L1-L2:  Negative. L2-L3: Mild far lateral disc bulging. Mild facet and ligament flavum hypertrophy. Mild bilateral L2 foraminal stenosis. L3-L4: Circumferential disc bulge and endplate spurring with broad-based posterior component. Moderate facet and ligament flavum hypertrophy. Degenerative facet joint fluid and occasional synovial cysts. Degenerative increased signal in the interspinous ligament. Moderate to severe spinal stenosis (series 7, image 25) with mild to moderate bilateral lateral recess stenosis, moderate right and mild left L3 foraminal stenosis. L4-L5: Circumferential disc bulge. Moderate facet and severe ligament flavum hypertrophy. Degenerative facet joint fluid. Several small posterior synovial cysts (series 4, image 8). Severe spinal and bilateral lateral recess stenosis. Mild to moderate right L4 foraminal stenosis. L5-S1: Rightward disc bulging and endplate spurring with mild facet hypertrophy. No spinal or convincing lateral recess stenosis. Mild right L5 foraminal stenosis. IMPRESSION: 1. Subacute T12 inferior endplate compression fracture with continued marrow edema. This appears to be a benign fracture. 35-40% loss of vertebral body height with mild retropulsion of the posterior inferior endplate resulting in mild T12 foraminal stenosis but no spinal stenosis. 2. No other acute osseous abnormality. 3. Multifactorial degenerative moderate to severe spinal stenosis at L3-L4 and L4-L5. Lateral recess stenosis more pronounced at the latter. Moderate right foraminal stenosis at both levels. 4. L3-L4 posterior element degeneration including interspinous ligament degeneration, degenerative appearing soft tissue edema about the right facet. Electronically Signed   By: Genevie Ann M.D.   On: 04/27/2018 08:57    Labs:  CBC: Recent Labs    06/18/17 0930 12/15/17 1020  WBC 7.3 7.3  HGB 13.9 13.5  HCT 43.2 42.2  PLT  241 239    COAGS: No results for input(s): INR, APTT in the last 8760 hours.  BMP: Recent Labs    06/18/17 0930 07/29/17 1153 12/15/17 1020  NA 141 142 141  K 4.1 3.7 4.2  CL 107 98 107  CO2 27 26 30   GLUCOSE 95 85 87  BUN 15 22 20   CALCIUM 9.3 9.8 9.0  CREATININE 0.82 1.11 1.30*  GFRNONAA >60 62  --   GFRAA >60 72  --     LIVER FUNCTION TESTS: Recent Labs    06/18/17 0930 12/15/17 1020  BILITOT 0.8 0.7  AST 17 28  ALT 13 24  ALKPHOS 97 114*  PROT 7.0 6.7  ALBUMIN 3.6 3.4*    TUMOR MARKERS: No results for input(s): AFPTM, CEA, CA199, CHROMGRNA in the last 8760 hours.  Assessment and Plan:  Symptomatic inferior endplate fracture of P59.  This appears to be an osteoporotic fracture without other evidence for metastatic disease.  We discussed the risks and benefits of vertebroplasty.  I answered all questions.  I believe he would benefit from spinal augmentation at T12.    We discussed that a T12 vertebroplasty may not help his lumbosacral pain.  The lower pain is likely related to spinal stenosis at L4-5 and L5-S1.  We will get a CBC & PT/PTT in anticipation of vertebroplasty 05/12/18.  I will also plan to obtain a biopsy at that time due to his personal history of Lung Cancer.  Thank you for this interesting consult.  I greatly enjoyed meeting [name] Fields and look forward to participating in their care.  A copy of this report was sent to the requesting provider on this date.  Electronically Signed: Arna Snipe, MD 05/07/2018,  10:14 AM   I spent a total of 15 Minutes   in face to face in clinical consultation, greater than 50% of which was counseling/coordinating care for Mr. Gwyn

## 2018-05-10 ENCOUNTER — Encounter: Payer: Self-pay | Admitting: Internal Medicine

## 2018-05-10 ENCOUNTER — Ambulatory Visit (INDEPENDENT_AMBULATORY_CARE_PROVIDER_SITE_OTHER): Payer: Medicare Other | Admitting: Internal Medicine

## 2018-05-10 VITALS — BP 113/74 | HR 92 | Temp 97.5°F | Wt 176.0 lb

## 2018-05-10 DIAGNOSIS — Z148 Genetic carrier of other disease: Secondary | ICD-10-CM

## 2018-05-10 NOTE — Progress Notes (Signed)
    Patient ID: [name] Fields, male   DOB: 1935/02/10, 84 y.o.   MRN: [id number]  HPI Patient referred in error. He does not have any signs/symptoms concerning for infection, provider notes do not comment on any concern for infection. Vitals are WNL. He has upcoming vertebroplasty with dr Jobe Igo this coming Wednesday. Has new onset back pain from osteoporosis related fracture.  Will gladly see patient in the future, if more documentation is provided  [facility] [phone]

## 2018-05-12 ENCOUNTER — Other Ambulatory Visit: Payer: Self-pay | Admitting: Radiology

## 2018-05-12 ENCOUNTER — Ambulatory Visit
Admission: RE | Admit: 2018-05-12 | Discharge: 2018-05-12 | Disposition: A | Discharge status: Home / Self Care | Payer: Medicare Other | Source: Ambulatory Visit | Attending: Internal Medicine | Admitting: Internal Medicine

## 2018-05-12 DIAGNOSIS — S22000A Wedge compression fracture of unspecified thoracic vertebra, initial encounter for closed fracture: Secondary | ICD-10-CM

## 2018-05-12 DIAGNOSIS — M8448XD Pathological fracture, other site, subsequent encounter for fracture with routine healing: Secondary | ICD-10-CM | POA: Diagnosis not present

## 2018-05-12 DIAGNOSIS — M8008XS Age-related osteoporosis with current pathological fracture, vertebra(e), sequela: Secondary | ICD-10-CM | POA: Diagnosis not present

## 2018-05-12 DIAGNOSIS — Z9889 Other specified postprocedural states: Secondary | ICD-10-CM

## 2018-05-12 MED ORDER — CEFAZOLIN SODIUM-DEXTROSE 2-4 GM/100ML-% IV SOLN
2.0000 g | INTRAVENOUS | Status: AC
  Administered 2018-05-12: 2 g via INTRAVENOUS

## 2018-05-12 MED ORDER — KETOROLAC TROMETHAMINE 30 MG/ML IJ SOLN
30.0000 mg | Freq: Once | INTRAMUSCULAR | Status: AC
  Administered 2018-05-12: 30 mg via INTRAVENOUS

## 2018-05-12 MED ORDER — MIDAZOLAM HCL 2 MG/2ML IJ SOLN
1.0000 mg | INTRAMUSCULAR | Status: DC | PRN
  Administered 2018-05-12: 0.5 mg via INTRAVENOUS

## 2018-05-12 MED ORDER — SODIUM CHLORIDE 0.9 % IV SOLN
Freq: Once | INTRAVENOUS | Status: AC
  Administered 2018-05-12: 10:00:00 via INTRAVENOUS

## 2018-05-12 MED ORDER — FENTANYL CITRATE (PF) 100 MCG/2ML IJ SOLN
25.0000 ug | INTRAMUSCULAR | Status: DC | PRN
  Administered 2018-05-12 (×2): 25 ug via INTRAVENOUS

## 2018-05-12 NOTE — Discharge Instructions (Signed)
Vertebroplasty Post Procedure Discharge Instructions  1. May resume a regular diet and any medications that you routinely take (including pain medications). 2. No driving day of procedure. 3. Upon discharge go home and rest for at least 4 hours.  May use an ice pack as needed to injection sites on back.  Ice to back 30 minutes on and 30 minutes off, all day. 4. May remove bandaids tomorrow after taking a shower. Replace daily with clean bandaid until healed. 5. Do not lift anything heavier than a milk jug. 6. Follow up with your attending physician in 2 weeks.    Please contact our office at [phone] for the following symptoms:   Fever greater than 100 degrees  Increased swelling, pain, or redness at injection site.   Thank you for visiting [facility].

## 2018-05-12 NOTE — Progress Notes (Signed)
Pt in recovery room. Awake, alert, talking in complete sentences. Vital signs stable. Will continue to monitor pt.

## 2018-05-24 ENCOUNTER — Encounter: Payer: Self-pay | Admitting: Emergency Medicine

## 2018-05-24 ENCOUNTER — Ambulatory Visit (INDEPENDENT_AMBULATORY_CARE_PROVIDER_SITE_OTHER): Payer: Medicare Other | Admitting: Emergency Medicine

## 2018-05-24 DIAGNOSIS — J309 Allergic rhinitis, unspecified: Secondary | ICD-10-CM

## 2018-05-24 DIAGNOSIS — C3490 Malignant neoplasm of unspecified part of unspecified bronchus or lung: Secondary | ICD-10-CM | POA: Diagnosis not present

## 2018-05-24 DIAGNOSIS — C3492 Malignant neoplasm of unspecified part of left bronchus or lung: Secondary | ICD-10-CM | POA: Diagnosis not present

## 2018-05-24 NOTE — Patient Instructions (Addendum)
Please continue Anoro once daily.  Take this every day.  It is your scheduled medication. Keep your albuterol available to use 2 puffs if needed for shortness of breath, chest tightness, wheezing. Keep your DuoNeb available to use 1 nebulizer treatment up to every 6 hours if you need it for shortness of breath Please continue your Claritin, Astelin nasal spray, Mucinex as you have been using them. We will repeat your CT scan of the chest without contrast in May 2020 to compare with your priors. Follow with Dr Lamonte Sakai in May 2020 after your CT to review the results together.

## 2018-05-24 NOTE — Assessment & Plan Note (Signed)
Severe disease.  He has difficulty managing the medications and describing symptoms.  Overall it does seem that his cough, congestion are improved.  Dyspnea is intermittent.  He does still use DuoNeb and albuterol intermittently.  I think he is taking the Anoro reliably although again the history is difficult.  He does like the Anoro and will continue it.  Continue Mucinex, vaccines are up-to-date.

## 2018-05-24 NOTE — Assessment & Plan Note (Signed)
With residual abnormalities on imaging, most recent PET scan mid 2019.  He is due for repeat CT chest in May 2020 to look for interval stability.

## 2018-05-24 NOTE — Assessment & Plan Note (Signed)
Continue loratadine, Astelin nasal spray

## 2018-05-24 NOTE — Progress Notes (Signed)
Subjective:    Patient ID: [name] Fields, male    DOB: 19-Aug-1934, 84 y.o.   MRN: [id number]  HPI  ROV 02/23/18 --84 year old man with a history of heavy tobacco, alpha-1 antitrypsin heterozygosity MS genotype and associated severe COPD, multifactorial dyspnea due to this as well as non-small cell lung cancers of the right lower lobe in the left lung treated with chemoradiation, some lingering left-sided interstitial disease and left-sided pleural disease.  FOB at [facility] 08/25/2017 did not show an endobronchial lesion.  He also has significant upper airway irritation and hoarseness, stridor that have been hard to manage, were worse 01/27/2018 when he was last seen here.  Very tough history-giver. Multiple complaints, including some R sided abd pain for the last few days, no constipation. He is bringing up brownish mucous. He c/o persistent hoarse voice and a barking cough.   He is supposed to be on loratadine, has still never started it. Has clear nasal congestion.  He has Stiolto but doesn't use reliably. Has duoneb that he uses 2x a day, doesn't believe it helps him, most concerned about his cough and mucous burden His last pred was 2 months ago by Dr Inda Merlin - last abx were ???  His most recent CT scan of the chest was done on 10/22 which showed stable left-sided posttreatment changes with volume loss and pleural thickening, stable focal right lower lobe consolidation and a new nodularity along the minor fissure extending into the right upper lobe.  This prompted a PET scan which was done on 10/31 which I have reviewed.  This shows some nonspecific tracer uptake in the treated area of the left upper lobe, more nonspecific uptake in the central right lower lobe and an area of chronic masslike architectural distortion, also some increased uptake in the chronic left pleural changes.  ROV 05/24/2018 --[name] Fields is 28 followed for severe obstructive lung disease and alpha-1 antitrypsin heterozygosity  (MS), right lower lobe and left non-small cell lung cancers last treated 2013, chronic upper airway hoarseness.  Last seen 04/06/2018 in our office and bronchodilators at that time included Anoro, albuterol which he uses approximately 2x a day. Uses DuoNeb about once a day. He occasionally forgets the Anoro apparently - feels that it may be helping him. He has a lot of confusion about the meds. No flares since last visit here. He will need a repeat Ct chest at the 6 month mark >> May 2020. Minimal cough.    Review of Systems As per HPI      Objective:   Physical Exam  Vitals:   05/24/18 0931  BP: (!) 118/58  Pulse: 64  SpO2: 96%  Weight: 178 lb 12.8 oz (81.1 kg)  Height: 5\' 10"  (1.778 m)   Gen: Pleasant, elderly gentleman, in no distress,  normal affect  ENT: No lesions,  mouth clear,  oropharynx clear, no postnasal drip, hearing aids in place, hoarse voice  Neck: No JVD, stridor present, referred to chest  Lungs: No use of accessory muscles, referred UA noise, no overt wheeze  Cardiovascular: RRR, heart sounds normal, no murmur or gallops, no peripheral edema  Musculoskeletal: No deformities, no cyanosis or clubbing  Neuro: alert, non focal  Skin: Warm, no lesions or rash     Assessment & Plan:  COPD (chronic obstructive pulmonary disease) with emphysema (HCC) Severe disease.  He has difficulty managing the medications and describing symptoms.  Overall it does seem that his cough, congestion are improved.  Dyspnea is intermittent.  He does still use DuoNeb and albuterol intermittently.  I think he is taking the Anoro reliably although again the history is difficult.  He does like the Anoro and will continue it.  Continue Mucinex, vaccines are up-to-date.  Non-small cell lung cancer With residual abnormalities on imaging, most recent PET scan mid 2019.  He is due for repeat CT chest in May 2020 to look for interval stability.  Allergic rhinitis Continue loratadine, Astelin  nasal spray  Baltazar Apo, MD, PhD 05/24/2018, 9:56 AM  Pulmonary and Critical Care [phone] or if no answer [phone]

## 2018-05-26 ENCOUNTER — Ambulatory Visit
Admission: RE | Admit: 2018-05-26 | Discharge: 2018-05-26 | Disposition: A | Discharge status: Home / Self Care | Payer: Medicare Other | Source: Ambulatory Visit | Attending: Radiology | Admitting: Radiology

## 2018-05-26 DIAGNOSIS — Z9889 Other specified postprocedural states: Secondary | ICD-10-CM | POA: Diagnosis not present

## 2018-05-26 DIAGNOSIS — S22080D Wedge compression fracture of T11-T12 vertebra, subsequent encounter for fracture with routine healing: Secondary | ICD-10-CM | POA: Diagnosis not present

## 2018-06-03 ENCOUNTER — Other Ambulatory Visit: Payer: Self-pay | Admitting: Internal Medicine

## 2018-06-03 DIAGNOSIS — M179 Osteoarthritis of knee, unspecified: Secondary | ICD-10-CM | POA: Diagnosis not present

## 2018-06-03 DIAGNOSIS — S22080A Wedge compression fracture of T11-T12 vertebra, initial encounter for closed fracture: Secondary | ICD-10-CM

## 2018-06-03 DIAGNOSIS — E782 Mixed hyperlipidemia: Secondary | ICD-10-CM | POA: Diagnosis not present

## 2018-06-03 DIAGNOSIS — J441 Chronic obstructive pulmonary disease with (acute) exacerbation: Secondary | ICD-10-CM | POA: Diagnosis not present

## 2018-06-03 DIAGNOSIS — N400 Enlarged prostate without lower urinary tract symptoms: Secondary | ICD-10-CM | POA: Diagnosis not present

## 2018-06-03 DIAGNOSIS — I2510 Atherosclerotic heart disease of native coronary artery without angina pectoris: Secondary | ICD-10-CM | POA: Diagnosis not present

## 2018-06-03 DIAGNOSIS — I10 Essential (primary) hypertension: Secondary | ICD-10-CM | POA: Diagnosis not present

## 2018-06-03 DIAGNOSIS — F322 Major depressive disorder, single episode, severe without psychotic features: Secondary | ICD-10-CM | POA: Diagnosis not present

## 2018-06-03 DIAGNOSIS — N401 Enlarged prostate with lower urinary tract symptoms: Secondary | ICD-10-CM | POA: Diagnosis not present

## 2018-06-03 DIAGNOSIS — J449 Chronic obstructive pulmonary disease, unspecified: Secondary | ICD-10-CM | POA: Diagnosis not present

## 2018-06-03 DIAGNOSIS — Z85118 Personal history of other malignant neoplasm of bronchus and lung: Secondary | ICD-10-CM | POA: Diagnosis not present

## 2018-06-15 ENCOUNTER — Inpatient Hospital Stay: Payer: Medicare Other

## 2018-06-15 ENCOUNTER — Other Ambulatory Visit: Payer: Self-pay

## 2018-06-15 ENCOUNTER — Inpatient Hospital Stay: Payer: Medicare Other | Attending: Hematology & Oncology | Admitting: Hematology & Oncology

## 2018-06-15 VITALS — BP 106/60 | HR 69 | Temp 97.8°F | Resp 17 | Wt 175.0 lb

## 2018-06-15 DIAGNOSIS — Z923 Personal history of irradiation: Secondary | ICD-10-CM | POA: Diagnosis not present

## 2018-06-15 DIAGNOSIS — R42 Dizziness and giddiness: Secondary | ICD-10-CM | POA: Diagnosis not present

## 2018-06-15 DIAGNOSIS — Z7982 Long term (current) use of aspirin: Secondary | ICD-10-CM | POA: Diagnosis not present

## 2018-06-15 DIAGNOSIS — Z9221 Personal history of antineoplastic chemotherapy: Secondary | ICD-10-CM

## 2018-06-15 DIAGNOSIS — R0602 Shortness of breath: Secondary | ICD-10-CM | POA: Diagnosis not present

## 2018-06-15 DIAGNOSIS — Z452 Encounter for adjustment and management of vascular access device: Secondary | ICD-10-CM | POA: Diagnosis not present

## 2018-06-15 DIAGNOSIS — Z95828 Presence of other vascular implants and grafts: Secondary | ICD-10-CM

## 2018-06-15 DIAGNOSIS — Z9181 History of falling: Secondary | ICD-10-CM | POA: Diagnosis not present

## 2018-06-15 DIAGNOSIS — Z85118 Personal history of other malignant neoplasm of bronchus and lung: Secondary | ICD-10-CM

## 2018-06-15 DIAGNOSIS — C3431 Malignant neoplasm of lower lobe, right bronchus or lung: Secondary | ICD-10-CM

## 2018-06-15 DIAGNOSIS — C3492 Malignant neoplasm of unspecified part of left bronchus or lung: Secondary | ICD-10-CM

## 2018-06-15 DIAGNOSIS — R05 Cough: Secondary | ICD-10-CM

## 2018-06-15 DIAGNOSIS — Z79899 Other long term (current) drug therapy: Secondary | ICD-10-CM | POA: Diagnosis not present

## 2018-06-15 DIAGNOSIS — Z85820 Personal history of malignant melanoma of skin: Secondary | ICD-10-CM

## 2018-06-15 DIAGNOSIS — R059 Cough, unspecified: Secondary | ICD-10-CM

## 2018-06-15 DIAGNOSIS — D0330 Melanoma in situ of unspecified part of face: Secondary | ICD-10-CM

## 2018-06-15 LAB — CBC WITH DIFFERENTIAL (CANCER CENTER ONLY)
ABS IMMATURE GRANULOCYTES: 0.03 10*3/uL (ref 0.00–0.07)
Basophils Absolute: 0.1 10*3/uL (ref 0.0–0.1)
Basophils Relative: 1 %
Eosinophils Absolute: 0.3 10*3/uL (ref 0.0–0.5)
Eosinophils Relative: 4 %
HEMATOCRIT: 45.3 % (ref 39.0–52.0)
HEMOGLOBIN: 14.3 g/dL (ref 13.0–17.0)
Immature Granulocytes: 1 %
LYMPHS PCT: 16 %
Lymphs Abs: 1 10*3/uL (ref 0.7–4.0)
MCH: 29.5 pg (ref 26.0–34.0)
MCHC: 31.6 g/dL (ref 30.0–36.0)
MCV: 93.6 fL (ref 80.0–100.0)
MONO ABS: 0.6 10*3/uL (ref 0.1–1.0)
MONOS PCT: 10 %
NEUTROS ABS: 4.6 10*3/uL (ref 1.7–7.7)
Neutrophils Relative %: 68 %
Platelet Count: 273 10*3/uL (ref 150–400)
RBC: 4.84 MIL/uL (ref 4.22–5.81)
RDW: 13.9 % (ref 11.5–15.5)
WBC Count: 6.6 10*3/uL (ref 4.0–10.5)
nRBC: 0 % (ref 0.0–0.2)

## 2018-06-15 LAB — CMP (CANCER CENTER ONLY)
ALK PHOS: 122 U/L (ref 38–126)
ALT: 14 U/L (ref 0–44)
AST: 19 U/L (ref 15–41)
Albumin: 4.2 g/dL (ref 3.5–5.0)
Anion gap: 8 (ref 5–15)
BILIRUBIN TOTAL: 0.9 mg/dL (ref 0.3–1.2)
BUN: 17 mg/dL (ref 8–23)
CALCIUM: 9.4 mg/dL (ref 8.9–10.3)
CO2: 30 mmol/L (ref 22–32)
CREATININE: 0.94 mg/dL (ref 0.61–1.24)
Chloride: 102 mmol/L (ref 98–111)
Glucose, Bld: 110 mg/dL — ABNORMAL HIGH (ref 70–99)
Potassium: 4.1 mmol/L (ref 3.5–5.1)
Sodium: 140 mmol/L (ref 135–145)
Total Protein: 7.2 g/dL (ref 6.5–8.1)

## 2018-06-15 MED ORDER — HEPARIN SOD (PORK) LOCK FLUSH 100 UNIT/ML IV SOLN
500.0000 [IU] | Freq: Once | INTRAVENOUS | Status: AC
  Administered 2018-06-15: 500 [IU] via INTRAVENOUS
  Filled 2018-06-15: qty 5

## 2018-06-15 MED ORDER — SODIUM CHLORIDE 0.9% FLUSH
10.0000 mL | INTRAVENOUS | Status: DC | PRN
  Administered 2018-06-15: 10 mL via INTRAVENOUS
  Filled 2018-06-15: qty 10

## 2018-06-15 NOTE — Progress Notes (Signed)
Hematology and Oncology Follow Up Visit  [name] Fields [id number] 02/28/1935 84 y.o. 06/15/2018   Principle Diagnosis:  1. 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 is here today for follow-up.  We see him every 6 months.  Since we last saw him, 1 of the issues that he has had his vertigo.  He apparently fell at home.  He hit a [place] cabinet.  He incurred a gash on his back.  This had to be closed.  There is been no problems with fever.  He said he had the flu back in September.  He does feel a little short of breath.  He does have some underlying lung disease.  I think he sees his pulmonologist in a few weeks.  He has had no change in bowel or bladder habits.  He has had no rashes.  He has had no leg swelling.  He has had no nausea or vomiting.  He says that he has had no change in his medications.  Of note, he did have a vertebroplasty at T12 back in early February.  Overall, his performance status is ECOG 2.  Medications:  Allergies as of 06/15/2018      Reactions   Sertraline Hcl    Patient does not remember the reaction      Medication List       Accurate as of June 15, 2018 10:59 AM. Always use your most recent med list.        albuterol (2.5 MG/3ML) 0.083% nebulizer solution Commonly known as:  PROVENTIL Take 3 mLs (2.5 mg total) by nebulization every 4 (four) hours as needed for wheezing.   albuterol 108 (90 Base) MCG/ACT inhaler Commonly known as:  PROVENTIL HFA;VENTOLIN HFA Inhale 2 puffs into the lungs every 6 (six) hours as needed for wheezing or shortness of breath.   aspirin EC 81 MG tablet Take 81 mg by mouth daily.   atorvastatin 20 MG tablet Commonly known as:  LIPITOR TAKE 1 TABLET BY MOUTH DAILY   Azelastine HCl 137 MCG/SPRAY  Soln Place 2 puffs into the nose 2 (two) times daily.   buPROPion 150 MG 12 hr tablet Commonly known as:  WELLBUTRIN SR Take 150 mg by mouth daily.   furosemide 40 MG tablet Commonly known as:  LASIX Take 1 tablet (40 mg total) by mouth daily.   guaiFENesin 600 MG 12 hr tablet Commonly known as:  MUCINEX Take 1 tablet (600 mg total) by mouth 2 (two) times daily.   HYDROcodone-acetaminophen 5-325 MG tablet Commonly known as:  NORCO/VICODIN TAKE 1/2 TABLET PO EVERY 6 HOURS   ipratropium-albuterol 0.5-2.5 (3) MG/3ML Soln Commonly known as:  DUONEB Take 3 mLs by nebulization every 6 (six) hours as needed.   Linzess 290 MCG Caps capsule Generic drug:  linaclotide Take 290 mcg by mouth 2 (two) times a week.   loratadine 10 MG tablet Commonly known as:  CLARITIN Take 1 tablet (10 mg total) by mouth daily.   magic mouthwash Soln Take 5 mLs by mouth 3 (three) times daily.   metoprolol tartrate 25 MG tablet Commonly known as:  LOPRESSOR TAKE 1/2 TABLET BY MOUTH TWICE DAILY   oseltamivir 75 MG capsule Commonly known as:  TAMIFLU   tamsulosin 0.4 MG  Caps capsule Commonly known as:  FLOMAX Take 0.4 mg by mouth daily with supper.   temazepam 30 MG capsule Commonly known as:  RESTORIL Take 30 mg by mouth at bedtime   tiZANidine 4 MG tablet Commonly known as:  ZANAFLEX TK 1 T PO TID FOR 7 DAYS PRN   traMADol 50 MG tablet Commonly known as:  ULTRAM TAKE 1 TO 2 TABLETS PO TID PRN P   umeclidinium-vilanterol 62.5-25 MCG/INH Aepb Commonly known as:  Anoro Ellipta Inhale 1 puff into the lungs daily.   venlafaxine XR 150 MG 24 hr capsule Commonly known as:  EFFEXOR-XR Take 150 mg by mouth daily.   Vitamin D3 50 MCG (2000 UT) Tabs Take 2,000 Units by mouth 2 (two) times daily.       Allergies:  Allergies  Allergen Reactions  . Sertraline Hcl     Patient does not remember the reaction    Past Medical History, Surgical history, Social history, and Family History  were reviewed and updated.  Review of Systems: Review of Systems  Constitutional: Negative.   HENT: Negative.   Eyes: Negative.   Respiratory: Negative.   Cardiovascular: Negative.   Gastrointestinal: Negative.   Genitourinary: Positive for frequency.  Musculoskeletal: Negative.   Skin: Negative.   Neurological: Positive for dizziness.  Endo/Heme/Allergies: Negative.   Psychiatric/Behavioral: Negative.      Physical Exam:  vitals were not taken for this visit.   Wt Readings from Last 3 Encounters:  06/15/18 175 lb (79.4 kg)  05/24/18 178 lb 12.8 oz (81.1 kg)  05/10/18 176 lb (79.8 kg)    Physical Exam Vitals signs reviewed.  HENT:     Head: Normocephalic and atraumatic.  Eyes:     Pupils: Pupils are equal, round, and reactive to light.  Neck:     Musculoskeletal: Normal range of motion.  Cardiovascular:     Rate and Rhythm: Normal rate and regular rhythm.     Heart sounds: Normal heart sounds.  Pulmonary:     Effort: Pulmonary effort is normal.     Breath sounds: Normal breath sounds.  Abdominal:     General: Bowel sounds are normal.     Palpations: Abdomen is soft.  Musculoskeletal: Normal range of motion.        General: No tenderness or deformity.  Lymphadenopathy:     Cervical: No cervical adenopathy.  Skin:    General: Skin is warm and dry.     Findings: No erythema or rash.  Neurological:     Mental Status: He is alert and oriented to person, place, and time.  Psychiatric:        Behavior: Behavior normal.        Thought Content: Thought content normal.        Judgment: Judgment normal.      Lab Results  Component Value Date   WBC 6.6 06/15/2018   HGB 14.3 06/15/2018   HCT 45.3 06/15/2018   MCV 93.6 06/15/2018   PLT 273 06/15/2018   Lab Results  Component Value Date   FERRITIN 92 12/20/2014   IRON 95 12/20/2014   TIBC 405 12/20/2014   UIBC 310 12/20/2014   IRONPCTSAT 23 12/20/2014   Lab Results  Component Value Date   RETICCTPCT  1.8 12/20/2014   RBC 4.84 06/15/2018   Lab Results  Component Value Date   KAPLAMBRATIO 1.98 (L) 12/20/2014   No results found for: IGGSERUM, IGA, IGMSERUM Lab Results  Component Value Date   TOTALPROTELP 6.9  12/20/2014   ALBUMINELP 3.4 12/20/2014   A1GS 0.3 12/20/2014   A2GS 0.9 12/20/2014   BETS 1.3 12/20/2014   GAMS 1.1 12/20/2014   MSPIKE Not Observed 12/20/2014   SPEI Comment 12/20/2014     Chemistry      Component Value Date/Time   NA 141 12/15/2017 1020   NA 142 07/29/2017 1153   NA 144 02/02/2017 0842   NA 140 12/01/2016 0957   K 4.2 12/15/2017 1020   K 3.8 02/02/2017 0842   K 4.5 12/01/2016 0957   CL 107 12/15/2017 1020   CL 105 02/02/2017 0842   CL 104 08/27/2012 1421   CO2 30 12/15/2017 1020   CO2 29 02/02/2017 0842   CO2 24 12/01/2016 0957   BUN 20 12/15/2017 1020   BUN 22 07/29/2017 1153   BUN 13 02/02/2017 0842   BUN 21.2 12/01/2016 0957   CREATININE 1.30 (H) 12/15/2017 1020   CREATININE 1.1 02/02/2017 0842   CREATININE 1.1 12/01/2016 0957      Component Value Date/Time   CALCIUM 9.0 12/15/2017 1020   CALCIUM 9.5 02/02/2017 0842   CALCIUM 10.2 12/01/2016 0957   ALKPHOS 114 (H) 12/15/2017 1020   ALKPHOS 104 (H) 02/02/2017 0842   ALKPHOS 111 12/01/2016 0957   AST 28 12/15/2017 1020   AST 30 12/01/2016 0957   ALT 24 12/15/2017 1020   ALT 28 02/02/2017 0842   ALT 24 12/01/2016 0957   BILITOT 0.7 12/15/2017 1020   BILITOT 0.92 12/01/2016 0957      Impression and Plan: Mr. Marling is a very pleasant 84 yo caucasian gentleman with history of both right and left lung cancers as well as melanoma. His most recent malignancy was stage I lung cancer diagnosed in April 2013. He had stereotactic radiosurgery that same year in July.  Of note, he had a chest x-ray back in mid December.  The chest x-ray looked okay without any evidence of cancer recurrence.  We will plan to get him back in another 6 months.   Volanda Napoleon, MD 3/10/202010:59 AM

## 2018-06-30 DIAGNOSIS — J441 Chronic obstructive pulmonary disease with (acute) exacerbation: Secondary | ICD-10-CM | POA: Diagnosis not present

## 2018-06-30 DIAGNOSIS — I10 Essential (primary) hypertension: Secondary | ICD-10-CM | POA: Diagnosis not present

## 2018-06-30 DIAGNOSIS — N401 Enlarged prostate with lower urinary tract symptoms: Secondary | ICD-10-CM | POA: Diagnosis not present

## 2018-06-30 DIAGNOSIS — I2510 Atherosclerotic heart disease of native coronary artery without angina pectoris: Secondary | ICD-10-CM | POA: Diagnosis not present

## 2018-06-30 DIAGNOSIS — N400 Enlarged prostate without lower urinary tract symptoms: Secondary | ICD-10-CM | POA: Diagnosis not present

## 2018-06-30 DIAGNOSIS — F329 Major depressive disorder, single episode, unspecified: Secondary | ICD-10-CM | POA: Diagnosis not present

## 2018-06-30 DIAGNOSIS — I252 Old myocardial infarction: Secondary | ICD-10-CM | POA: Diagnosis not present

## 2018-06-30 DIAGNOSIS — J449 Chronic obstructive pulmonary disease, unspecified: Secondary | ICD-10-CM | POA: Diagnosis not present

## 2018-06-30 DIAGNOSIS — C3430 Malignant neoplasm of lower lobe, unspecified bronchus or lung: Secondary | ICD-10-CM | POA: Diagnosis not present

## 2018-06-30 DIAGNOSIS — Z85118 Personal history of other malignant neoplasm of bronchus and lung: Secondary | ICD-10-CM | POA: Diagnosis not present

## 2018-06-30 DIAGNOSIS — E782 Mixed hyperlipidemia: Secondary | ICD-10-CM | POA: Diagnosis not present

## 2018-06-30 DIAGNOSIS — F322 Major depressive disorder, single episode, severe without psychotic features: Secondary | ICD-10-CM | POA: Diagnosis not present

## 2018-07-12 ENCOUNTER — Telehealth: Payer: Self-pay | Admitting: Nurse Practitioner

## 2018-07-12 NOTE — Telephone Encounter (Signed)
Spoke with Mr. Siddoway.  Changing OV for 4/13 to a phone visit at 12 noon.   Burtis Junes, RN, [facility] [address] [place], [place]  [address] [phone]

## 2018-07-15 ENCOUNTER — Telehealth: Payer: Self-pay | Admitting: *Deleted

## 2018-07-15 NOTE — Telephone Encounter (Signed)
Virtual Visit Pre-Appointment Phone Call  Steps For Call:  1. Confirm consent - "In the setting of the current Covid19 crisis, you are scheduled for a (phone or video) visit with your provider on (date) at (time).  Just as we do with many in-office visits, in order for you to participate in this visit, we must obtain consent.  If you'd like, I can send this to your mychart (if signed up) or email for you to review.  Otherwise, I can obtain your verbal consent now.  All virtual visits are billed to your insurance company just like a normal visit would be.  By agreeing to a virtual visit, we'd like you to understand that the technology does not allow for your provider to perform an examination, and thus may limit your provider's ability to fully assess your condition.  Finally, though the technology is pretty good, we cannot assure that it will always work on either your or our end, and in the setting of a video visit, we may have to convert it to a phone-only visit.  In either situation, we cannot ensure that we have a secure connection.  Are you willing to proceed?"  2. Give patient instructions for WebEx download to smartphone as below if video visit  3. Advise patient to be prepared with any vital sign or heart rhythm information, their current medicines, and a piece of paper and pen handy for any instructions they may receive the day of their visit  4. Inform patient they will receive a phone call 15 minutes prior to their appointment time (may be from unknown caller ID) so they should be prepared to answer  5. Confirm that appointment type is correct in Epic appointment notes (video vs telephone)    TELEPHONE CALL NOTE  [name] Fields has been deemed a candidate for a follow-up tele-health visit to limit community exposure during the Covid-19 pandemic. I spoke with the patient via phone to ensure availability of phone/video source, confirm preferred email & phone number, and discuss  instructions and expectations.  I reminded [name] Fields to be prepared with any vital sign and/or heart rhythm information that could potentially be obtained via home monitoring, at the time of his visit. I reminded [name] Fields to expect a phone call at the time of his visit if his visit.  Did the patient verbally acknowledge consent to treatment? Yes.  April 13 @ 12:00 pm.  Greer Ee 07/15/2018 9:12 AM   DOWNLOADING THE WEBEX SOFTWARE TO SMARTPHONE  - If Apple, go to App Store and type in WebEx in the search bar. [place] [employer], the blue/green circle. The app is free but as with any other app downloads, their phone may require them to verify saved payment information or Apple password. The patient does NOT have to create an account.  - If Android, ask patient to go to [employer] and type in WebEx in the search bar. [place] [employer], the blue/green circle. The app is free but as with any other app downloads, their phone may require them to verify saved payment information or Android password. The patient does NOT have to create an account.   CONSENT FOR TELE-HEALTH VISIT - PLEASE REVIEW  I hereby voluntarily request, consent and authorize [facility] and its employed or contracted physicians, Engineer, materials, nurse practitioners or other licensed health care professionals Truitt Merle, NP), to provide me with telemedicine health care services (the phone) as  deemed necessary by the treating Practitioner. I acknowledge and consent to receive the Services by the Practitioner via telemedicine. I understand that the telemedicine visit will involve communicating with the Practitioner through live audiovisual communication technology and the disclosure of certain medical information by electronic transmission. I acknowledge that I have been given the opportunity to request an in-person assessment or other available alternative prior to  the telemedicine visit and am voluntarily participating in the telemedicine visit.  I understand that I have the right to withhold or withdraw my consent to the use of telemedicine in the course of my care at any time, without affecting my right to future care or treatment, and that the Practitioner or I may terminate the telemedicine visit at any time. I understand that I have the right to inspect all information obtained and/or recorded in the course of the telemedicine visit and may receive copies of available information for a reasonable fee.  I understand that some of the potential risks of receiving the Services via telemedicine include:  Marland Kitchen Delay or interruption in medical evaluation due to technological equipment failure or disruption; . Information transmitted may not be sufficient (e.g. poor resolution of images) to allow for appropriate medical decision making by the Practitioner; and/or  . In rare instances, security protocols could fail, causing a breach of personal health information.  Furthermore, I acknowledge that it is my responsibility to provide information about my medical history, conditions and care that is complete and accurate to the best of my ability. I acknowledge that Practitioner's advice, recommendations, and/or decision may be based on factors not within their control, such as incomplete or inaccurate data provided by me or distortions of diagnostic images or specimens that may result from electronic transmissions. I understand that the practice of medicine is not an exact science and that Practitioner makes no warranties or guarantees regarding treatment outcomes. I acknowledge that I will receive a copy of this consent concurrently upon execution via email to the email address I last provided but may also request a printed copy by calling the office of [facility].    I understand that my insurance will be billed for this visit.   I have read or had this consent read to  me. . I understand the contents of this consent, which adequately explains the benefits and risks of the Services being provided via telemedicine.  . I have been provided ample opportunity to ask questions regarding this consent and the Services and have had my questions answered to my satisfaction. . I give my informed consent for the services to be provided through the use of telemedicine in my medical care  By participating in this telemedicine visit I agree to the above.

## 2018-07-18 NOTE — Progress Notes (Signed)
Telehealth Visit     Virtual Visit via Telephone Note   This visit type was conducted due to national recommendations for restrictions regarding the COVID-19 Pandemic (e.g. social distancing) in an effort to limit this patient's exposure and mitigate transmission in our community.  Due to his co-morbid illnesses, this patient is at least at moderate risk for complications without adequate follow up.  This format is felt to be most appropriate for this patient at this time.  The patient did not have access to video technology/had technical difficulties with video requiring transitioning to audio format only (telephone).  All issues noted in this document were discussed and addressed.  No physical exam could be performed with this format.  Please refer to the patient's chart for his  consent to telehealth for [facility].   Evaluation Performed:  Follow-up visit  This visit type was conducted due to national recommendations for restrictions regarding the COVID-19 Pandemic (e.g. social distancing).  This format is felt to be most appropriate for this patient at this time.  All issues noted in this document were discussed and addressed.  No physical exam was performed (except for noted visual exam findings with Video Visits).  Please refer to the patient's chart (MyChart message for video visits and phone note for telephone visits) for the patient's consent to telehealth for [facility].  Date:  07/19/2018   ID:  [name] Fields, DOB 19-Jul-1934, MRN [id number]  Patient Location:  Home  Provider location:   Home  PCP:  [name], MD  Cardiologist:  Servando Snare & No primary care provider on file.  Electrophysiologist:  None   Chief Complaint:  Follow up visit.   History of Present Illness:    [name] Fields is a 84 y.o. male who presents via audio/video conferencing for a telehealth visit today.  Seen for Dr. Marlou Porch. He is a former patient of Dr. Susa Simmonds that I cared for for many  years. I typically see him.   He has a history of coronary artery disease with prior lateral wall MI status post multiple stents in the past. Other issues include lung cancer in both sides (treated with radiation), melanomaof the left cheeck, fibrosis, recurrent pleural effusion. He has had a pericardial window for an effusion back in 2011.   I saw him back in July of 2017- he had been referred back for shortness of breath. Productive cough - COPD - I sent him back to pulmonary for evaluation/treatment - seemed more pulmonary related/radiation effects. Did not understand how to use his Spiriva. Some swelling - getting too much salt. I added some low dose Lasix. He was better on return but has remained very limited by his breathing.  Last seen in October of 2019 - lots of alls - very short of breath. Lots of cough with brown sputum. Had his CT of the chest updated - ended up getting PET as well. Has had a compression fracture. Very limited by his breathing. He was very limited in his ability to take care of himself.   The patient does not have symptoms concerning for COVID-19 infection (fever, chills, cough, or new shortness of breath).   Seen today via phone conversation - he does not have a computer or Internet access. No smart phone. He is staying home. He is hard of hearing. His sister is helping him out with groceries. He notes he actually has felt pretty good - better - over the past few days - he is not really  sure as to why. Says he has learned to accept his "shortness of breath". He tends to wheeze - this is chronic. He denies chest pain. If he gets up too fast, he will get dizzy and fall. He had a fall about 2 weeks ago - "moved too quick". He is using a rolling walker now with a seat if he walks outside. He does not have a way of checking his blood pressure. He tells me he is grateful for me and Dr. Doreatha Lew.   Past Medical History:  Diagnosis Date  . Anxiety    takes wellbutrin  daily  . Arthritis    knees  . COPD (chronic obstructive pulmonary disease) ([place])   . Diverticulosis   . Emphysema of lung ([place])   . GERD (gastroesophageal reflux disease)   . Hearing loss   . Hx of radiation therapy 09/29/11;10/01/11;10/03/11;10/06/11;10/08/11;   RLLlung,50Gy/25fx - SBRT  . Hyperlipidemia   . Hypertension   . Hypokinesia    MILD INFERIOR  . IHD (ischemic heart disease)    Remote MI in 2001 with stent to the LCX  . Lactose intolerance   . Non-small cell carcinoma of lung ([place])    Dx in October of 2010  . Normal nuclear stress test 2009   EF 63%. No ischemia. 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  . Pericardial effusion July 2011   s/p pericardial window; last echo in August 2012 with minimal effusion  . Pleural effusion, left   . SOB (shortness of breath)    Chronic with known fibrosis  . Status post chemotherapy 06/04/2009 -  09/06/2009    4 cycles of Cisplatin and Gemcitabine with Neulasta Support   . Vertigo    hx. of- ambulates with cane for safety.   Past Surgical History:  Procedure Laterality Date  . CARDIAC CATHETERIZATION  07/11/2003   EF 60%, REPEAT, WHICH SHOWED 30% PROXIMAL NARROWING IN THE RIGHT  CORONARY ARTERY  . CARDIOVASCULAR STRESS TEST  05/2007   EF 63%  . CATARACT EXTRACTION, BILATERAL Bilateral   . COLONOSCOPY WITH PROPOFOL N/A 01/30/2015   Procedure: COLONOSCOPY WITH PROPOFOL;  Surgeon: Garlan Fair, MD;  Location: [facility] ENDOSCOPY;  Service: Endoscopy;  Laterality: N/A;  . CORONARY STENT PLACEMENT  07/1999   STENT TO THE LEFT CIRCUMFLEX  . HERNIA REPAIR     as an adult- R inguinal   . PERICARDIAL WINDOW  July 2011  . TRANSTHORACIC ECHOCARDIOGRAM  02/22/2010   EF 55-60%     Current Meds  Medication Sig  . albuterol (PROVENTIL HFA;VENTOLIN HFA) 108 (90 Base) MCG/ACT inhaler Inhale 2 puffs into the lungs every 6 (six) hours as needed for wheezing or shortness of breath.  Marland Kitchen albuterol (PROVENTIL) (2.5 MG/3ML) 0.083% nebulizer solution Take 3 mLs (2.5 mg  total) by nebulization every 4 (four) hours as needed for wheezing.  Marland Kitchen aspirin EC 81 MG tablet Take 81 mg by mouth daily.  Marland Kitchen atorvastatin (LIPITOR) 20 MG tablet TAKE 1 TABLET BY MOUTH DAILY (Patient taking differently: Take 20 mg by mouth once a day)  . Azelastine HCl 137 MCG/SPRAY SOLN Place 2 puffs into the nose 2 (two) times daily.  Marland Kitchen buPROPion (WELLBUTRIN SR) 150 MG 12 hr tablet Take 150 mg by mouth daily.   . Cholecalciferol (VITAMIN D3) 2000 units TABS Take 2,000 Units by mouth 2 (two) times daily.   . furosemide (LASIX) 40 MG tablet Take 1 tablet (40 mg total) by mouth daily.  Marland Kitchen guaiFENesin (MUCINEX) 600 MG 12 hr  tablet Take 1 tablet (600 mg total) by mouth 2 (two) times daily.  Marland Kitchen HYDROcodone-acetaminophen (NORCO/VICODIN) 5-325 MG tablet TAKE 1/2 TABLET PO EVERY 6 HOURS  . ipratropium-albuterol (DUONEB) 0.5-2.5 (3) MG/3ML SOLN Take 3 mLs by nebulization every 6 (six) hours as needed.  . linaclotide (LINZESS) 290 MCG CAPS capsule Take 290 mcg by mouth 2 (two) times a week.  . loratadine (CLARITIN) 10 MG tablet Take 1 tablet (10 mg total) by mouth daily.  . magic mouthwash SOLN Take 5 mLs by mouth 3 (three) times daily.  . metoprolol tartrate (LOPRESSOR) 25 MG tablet TAKE 1/2 TABLET BY MOUTH TWICE DAILY  . Tamsulosin HCl (FLOMAX) 0.4 MG CAPS Take 0.4 mg by mouth daily with supper.   . temazepam (RESTORIL) 30 MG capsule Take 30 mg by mouth at bedtime  . tiZANidine (ZANAFLEX) 4 MG tablet TK 1 T PO TID FOR 7 DAYS PRN  . traMADol (ULTRAM) 50 MG tablet TAKE 1 TO 2 TABLETS PO TID PRN P  . umeclidinium-vilanterol (ANORO ELLIPTA) 62.5-25 MCG/INH AEPB Inhale 1 puff into the lungs daily.  Marland Kitchen venlafaxine XR (EFFEXOR-XR) 150 MG 24 hr capsule Take 150 mg by mouth daily.   . [DISCONTINUED] oseltamivir (TAMIFLU) 75 MG capsule      Allergies:   Sertraline hcl   Social History   Tobacco Use  . Smoking status: Former Smoker    Packs/day: 3.00    Years: 40.00    Pack years: 120.00    Types:  Cigarettes    Last attempt to quit: 04/07/1994    Years since quitting: 24.2  . Smokeless tobacco: Never Used  Substance Use Topics  . Alcohol use: No    Alcohol/week: 0.0 standard drinks  . Drug use: No     Family Hx: The patient's family history includes Lung cancer in his father; Stroke in his sister. There is no history of Heart disease, Anesthesia problems, or Lung disease.  ROS:   Please see the history of present illness.   All other systems reviewed are negative except for chronic shortness of breath.    Objective:    Vital Signs:  Wt 180 lb (81.6 kg)   BMI 25.83 kg/m    Wt Readings from Last 3 Encounters:  07/19/18 180 lb (81.6 kg)  06/15/18 175 lb (79.4 kg)  05/24/18 178 lb 12.8 oz (81.1 kg)    Alert. He is in no acute distress. He is hard of hearing. He has some shortness of breath as the conversation progresses.    Labs/Other Tests and Data Reviewed:    Lab Results  Component Value Date   WBC 6.6 06/15/2018   HGB 14.3 06/15/2018   HCT 45.3 06/15/2018   PLT 273 06/15/2018   GLUCOSE 110 (H) 06/15/2018   CHOL 147 06/24/2016   TRIG 158 (H) 06/24/2016   HDL 70 06/24/2016   LDLCALC 45 06/24/2016   ALT 14 06/15/2018   AST 19 06/15/2018   NA 140 06/15/2018   K 4.1 06/15/2018   CL 102 06/15/2018   CREATININE 0.94 06/15/2018   BUN 17 06/15/2018   CO2 30 06/15/2018   TSH 5.020 (H) 06/24/2016   INR 1.17 12/20/2014   HGBA1C 5.6 12/20/2014     BNP (last 3 results) No results for input(s): BNP in the last 8760 hours.  ProBNP (last 3 results) Recent Labs    07/29/17 1153  PROBNP 525*        Prior CV studies:    The following studies  were reviewed today:  PET 01/2018 IMPRESSION: 1. No specific findings highly worrisome for residual or recurrent tumor identified. 2. Within the area of treated tumor in the paramediastinal anterior left upper lung there is a focal area of increased tracer uptake, SUV max 6.87 above background activity (of  treated tumor) without specific anatomic correlate. This is a nonspecific finding but warrants close attention on follow-up imaging to exclude local tumor recurrence. 3. Mild nonspecific uptake localizes to the area of progressive chronic masslike architectural distortion within the central right lower lobe. SUV max equals 3.49. 4. Stable 5 mm nodule within the anterior left lung which is too small to reliably characterize by PET-CT. 5. Chronic loculated left pleural fluid with overlying pleural thickening and mild increased uptake localizing to the thickened pleura. Increased uptake in this area is nonspecific in may reflect chronic inflammatory changes. Given the relative stability of the volume of pleural fluid malignant pleural effusion is considered less favored. 6. Aortic Atherosclerosis (ICD10-I70.0). Multi vessel coronary artery atherosclerotic calcifications noted. 7. Gallstones.   Electronically Signed   By: Kerby Moors M.D.   On: 02/04/2018 09:01  CT CHEST 01/2018 IMPRESSION: 1. Post treatment changes in the LEFT hilar region, stable over multiple prior studies. Stable LEFT pleural thickening and volume loss. 2. 3. Stable focal consolidation in the RIGHT LOWER lobe. 4. New nodularity along the minor fissure and extending into the RIGHT UPPER lobe warranting follow-up. Considerations include subpleural lymph nodes or tumor. 5. Stable small pericardial effusion. 6. Pulmonary artery enlargement consistent with pulmonary arterial hypertension. 7. Cholelithiasis. 8.  Emphysema (ICD10-J43.9). 9.  Aortic Atherosclerosis (ICD10-I70.0).   Electronically Signed   By: Nolon Nations M.D.   On: 01/26/2018 22:13  Echo Study Conclusions 07/2017  - Left ventricle: The cavity size was normal. Wall thickness was increased in a pattern of mild LVH. Systolic function was normal. The estimated ejection fraction was in the range of 55% to 60%. Although no  diagnostic regional wall motion abnormality was identified, this possibility cannot be completely excluded on the basis of this study. Features are consistent with a pseudonormal left ventricular filling pattern, with concomitant abnormal relaxation and increased filling pressure (grade 2 diastolic dysfunction). - Aortic valve: Noncoronary cusp mobility was moderately restricted. There was mild regurgitation. Valve area (VTI): 2.61 cm^2. - Mitral valve: There was moderate regurgitation directed centrally. Diastolic regurgitation was present. - Left atrium: The atrium was moderately dilated. - Right ventricle: The cavity size was mildly dilated. Systolic function was mildly reduced. - Right atrium: The atrium was mildly dilated. - Tricuspid valve: There was mild regurgitation. Diastolic regurgitation was present. - Pulmonary arteries: Systolic pressure was mildly increased. PA peak pressure: 39 mm Hg (S). - Pericardium, extracardiac: A trivial pericardial effusion was identified.   Myoview Study Highlights from 04/2015   Nuclear stress EF: 56%.  The study is normal.  This is a low risk study.  The left ventricular ejection fraction is normal (55-65%).  Normal resting and stress perfusion EF 56%      ASSESSMENT & PLAN:     1. Chronic shortness of breath -most likely multifactorial and primarily due to prior lung cancer/radiation - he sounds stable overall.   2. CAD - no active chest pain noted. Would favor conservative management going forward.   3. Multiple falls - balance is poor.   4. Past pericardial effusion - s/p window - lastCT findings basically unchanged.Last echo noted just trivial effusion.Noted in record but not discussed with patient today.  5. Prior lung cancers/melanoma - followed byDr. Marin Olp in [place].  6. HLD-remains on statin therapy.   7. COVID-19 Education: The signs and symptoms of COVID-19 were  discussed with the patient and how to seek care for testing (follow up with PCP or arrange E-visit).  The importance of social distancing, staying at home and hand hygiene were discussed today.  Patient Risk:   After full review of this patient's clinical status, I feel that they are at least moderate risk at this time.  Time:   Today, I have spent 10 minutes with the patient with telehealth technology discussing the above issues.     Medication Adjustments/Labs and Tests Ordered: Current medicines are reviewed at length with the patient today.  Concerns regarding medicines are outlined above.   Tests Ordered: No orders of the defined types were placed in this encounter.   Medication Changes: No orders of the defined types were placed in this encounter.   Disposition:  FU with me in August.   Patient is agreeable to this plan and will call if any problems develop in the interim.   Amie Critchley, NP  07/19/2018 12:09 PM    [facility]

## 2018-07-19 ENCOUNTER — Encounter: Payer: Self-pay | Admitting: Nurse Practitioner

## 2018-07-19 ENCOUNTER — Telehealth (INDEPENDENT_AMBULATORY_CARE_PROVIDER_SITE_OTHER): Payer: Medicare Other | Admitting: Nurse Practitioner

## 2018-07-19 ENCOUNTER — Other Ambulatory Visit: Payer: Self-pay

## 2018-07-19 VITALS — Wt 180.0 lb

## 2018-07-19 DIAGNOSIS — Z7189 Other specified counseling: Secondary | ICD-10-CM

## 2018-07-19 DIAGNOSIS — R0602 Shortness of breath: Secondary | ICD-10-CM | POA: Diagnosis not present

## 2018-07-19 DIAGNOSIS — I259 Chronic ischemic heart disease, unspecified: Secondary | ICD-10-CM

## 2018-07-19 NOTE — Patient Instructions (Addendum)
After Visit Summary:  We will be checking the following labs today - NONE  Medication Instructions:    Continue with your current medicines.    If you need a refill on your cardiac medications before your next appointment, please call your pharmacy.     Testing/Procedures To Be Arranged:  N/A  Follow-Up:   See me in August.     At [facility], you and your health needs are our priority.  As part of our continuing mission to provide you with exceptional heart care, we have created designated Provider Care Teams.  These Care Teams include your primary Cardiologist (physician) and Advanced Practice Providers (APPs -  Physician Assistants and Nurse Practitioners) who all work together to provide you with the care you need, when you need it.  Special Instructions:  . Stay safe, stay home and wash your hands for at least 20 seconds! . It was good to talk with you today.   Call the [place] office at [phone] if you have any questions, problems or concerns.

## 2018-08-05 ENCOUNTER — Other Ambulatory Visit: Payer: Medicare Other

## 2018-08-05 DIAGNOSIS — E782 Mixed hyperlipidemia: Secondary | ICD-10-CM | POA: Diagnosis not present

## 2018-08-05 DIAGNOSIS — C3430 Malignant neoplasm of lower lobe, unspecified bronchus or lung: Secondary | ICD-10-CM | POA: Diagnosis not present

## 2018-08-05 DIAGNOSIS — F322 Major depressive disorder, single episode, severe without psychotic features: Secondary | ICD-10-CM | POA: Diagnosis not present

## 2018-08-05 DIAGNOSIS — Z85118 Personal history of other malignant neoplasm of bronchus and lung: Secondary | ICD-10-CM | POA: Diagnosis not present

## 2018-08-05 DIAGNOSIS — N401 Enlarged prostate with lower urinary tract symptoms: Secondary | ICD-10-CM | POA: Diagnosis not present

## 2018-08-05 DIAGNOSIS — J441 Chronic obstructive pulmonary disease with (acute) exacerbation: Secondary | ICD-10-CM | POA: Diagnosis not present

## 2018-08-05 DIAGNOSIS — I2510 Atherosclerotic heart disease of native coronary artery without angina pectoris: Secondary | ICD-10-CM | POA: Diagnosis not present

## 2018-08-05 DIAGNOSIS — I10 Essential (primary) hypertension: Secondary | ICD-10-CM | POA: Diagnosis not present

## 2018-08-05 DIAGNOSIS — F329 Major depressive disorder, single episode, unspecified: Secondary | ICD-10-CM | POA: Diagnosis not present

## 2018-08-05 DIAGNOSIS — N400 Enlarged prostate without lower urinary tract symptoms: Secondary | ICD-10-CM | POA: Diagnosis not present

## 2018-08-05 DIAGNOSIS — J449 Chronic obstructive pulmonary disease, unspecified: Secondary | ICD-10-CM | POA: Diagnosis not present

## 2018-08-17 ENCOUNTER — Other Ambulatory Visit: Payer: Self-pay

## 2018-08-17 ENCOUNTER — Inpatient Hospital Stay: Payer: Medicare Other | Attending: Hematology & Oncology

## 2018-08-17 ENCOUNTER — Ambulatory Visit (HOSPITAL_COMMUNITY)
Admission: RE | Admit: 2018-08-17 | Discharge: 2018-08-17 | Disposition: A | Discharge status: Home / Self Care | Payer: Medicare Other | Source: Ambulatory Visit | Attending: Emergency Medicine | Admitting: Emergency Medicine

## 2018-08-17 VITALS — BP 153/79 | HR 71 | Temp 97.5°F | Resp 18

## 2018-08-17 DIAGNOSIS — Z452 Encounter for adjustment and management of vascular access device: Secondary | ICD-10-CM | POA: Insufficient documentation

## 2018-08-17 DIAGNOSIS — Z9221 Personal history of antineoplastic chemotherapy: Secondary | ICD-10-CM | POA: Insufficient documentation

## 2018-08-17 DIAGNOSIS — C3490 Malignant neoplasm of unspecified part of unspecified bronchus or lung: Secondary | ICD-10-CM | POA: Diagnosis not present

## 2018-08-17 DIAGNOSIS — Z95828 Presence of other vascular implants and grafts: Secondary | ICD-10-CM

## 2018-08-17 DIAGNOSIS — Z923 Personal history of irradiation: Secondary | ICD-10-CM | POA: Insufficient documentation

## 2018-08-17 DIAGNOSIS — Z85820 Personal history of malignant melanoma of skin: Secondary | ICD-10-CM | POA: Diagnosis not present

## 2018-08-17 DIAGNOSIS — Z85118 Personal history of other malignant neoplasm of bronchus and lung: Secondary | ICD-10-CM | POA: Insufficient documentation

## 2018-08-17 DIAGNOSIS — C3412 Malignant neoplasm of upper lobe, left bronchus or lung: Secondary | ICD-10-CM | POA: Diagnosis not present

## 2018-08-17 MED ORDER — HEPARIN SOD (PORK) LOCK FLUSH 100 UNIT/ML IV SOLN
500.0000 [IU] | Freq: Once | INTRAVENOUS | Status: AC
  Administered 2018-08-17: 500 [IU] via INTRAVENOUS
  Filled 2018-08-17: qty 5

## 2018-08-17 MED ORDER — SODIUM CHLORIDE 0.9% FLUSH
10.0000 mL | INTRAVENOUS | Status: DC | PRN
  Administered 2018-08-17: 10 mL via INTRAVENOUS
  Filled 2018-08-17: qty 10

## 2018-08-24 ENCOUNTER — Other Ambulatory Visit: Payer: Medicare Other

## 2018-08-31 DIAGNOSIS — I110 Hypertensive heart disease with heart failure: Secondary | ICD-10-CM | POA: Diagnosis not present

## 2018-08-31 DIAGNOSIS — Z923 Personal history of irradiation: Secondary | ICD-10-CM | POA: Diagnosis not present

## 2018-08-31 DIAGNOSIS — Z87891 Personal history of nicotine dependence: Secondary | ICD-10-CM | POA: Diagnosis not present

## 2018-08-31 DIAGNOSIS — K273 Acute peptic ulcer, site unspecified, without hemorrhage or perforation: Secondary | ICD-10-CM | POA: Diagnosis not present

## 2018-08-31 DIAGNOSIS — N400 Enlarged prostate without lower urinary tract symptoms: Secondary | ICD-10-CM | POA: Diagnosis not present

## 2018-08-31 DIAGNOSIS — M7730 Calcaneal spur, unspecified foot: Secondary | ICD-10-CM | POA: Diagnosis not present

## 2018-08-31 DIAGNOSIS — Z85828 Personal history of other malignant neoplasm of skin: Secondary | ICD-10-CM | POA: Diagnosis not present

## 2018-08-31 DIAGNOSIS — Z85118 Personal history of other malignant neoplasm of bronchus and lung: Secondary | ICD-10-CM | POA: Diagnosis not present

## 2018-08-31 DIAGNOSIS — H9190 Unspecified hearing loss, unspecified ear: Secondary | ICD-10-CM | POA: Diagnosis not present

## 2018-08-31 DIAGNOSIS — K219 Gastro-esophageal reflux disease without esophagitis: Secondary | ICD-10-CM | POA: Diagnosis not present

## 2018-08-31 DIAGNOSIS — M75101 Unspecified rotator cuff tear or rupture of right shoulder, not specified as traumatic: Secondary | ICD-10-CM | POA: Diagnosis not present

## 2018-08-31 DIAGNOSIS — I2510 Atherosclerotic heart disease of native coronary artery without angina pectoris: Secondary | ICD-10-CM | POA: Diagnosis not present

## 2018-08-31 DIAGNOSIS — E785 Hyperlipidemia, unspecified: Secondary | ICD-10-CM | POA: Diagnosis not present

## 2018-08-31 DIAGNOSIS — J449 Chronic obstructive pulmonary disease, unspecified: Secondary | ICD-10-CM | POA: Diagnosis not present

## 2018-08-31 DIAGNOSIS — F418 Other specified anxiety disorders: Secondary | ICD-10-CM | POA: Diagnosis not present

## 2018-08-31 DIAGNOSIS — R32 Unspecified urinary incontinence: Secondary | ICD-10-CM | POA: Diagnosis not present

## 2018-08-31 DIAGNOSIS — Z8781 Personal history of (healed) traumatic fracture: Secondary | ICD-10-CM | POA: Diagnosis not present

## 2018-08-31 DIAGNOSIS — I252 Old myocardial infarction: Secondary | ICD-10-CM | POA: Diagnosis not present

## 2018-08-31 DIAGNOSIS — K5790 Diverticulosis of intestine, part unspecified, without perforation or abscess without bleeding: Secondary | ICD-10-CM | POA: Diagnosis not present

## 2018-08-31 DIAGNOSIS — Z8701 Personal history of pneumonia (recurrent): Secondary | ICD-10-CM | POA: Diagnosis not present

## 2018-08-31 DIAGNOSIS — Z95818 Presence of other cardiac implants and grafts: Secondary | ICD-10-CM | POA: Diagnosis not present

## 2018-08-31 DIAGNOSIS — I5030 Unspecified diastolic (congestive) heart failure: Secondary | ICD-10-CM | POA: Diagnosis not present

## 2018-08-31 DIAGNOSIS — K5900 Constipation, unspecified: Secondary | ICD-10-CM | POA: Diagnosis not present

## 2018-08-31 DIAGNOSIS — M170 Bilateral primary osteoarthritis of knee: Secondary | ICD-10-CM | POA: Diagnosis not present

## 2018-08-31 DIAGNOSIS — Z7982 Long term (current) use of aspirin: Secondary | ICD-10-CM | POA: Diagnosis not present

## 2018-09-01 ENCOUNTER — Ambulatory Visit: Payer: Medicare Other | Admitting: Emergency Medicine

## 2018-09-03 DIAGNOSIS — C3430 Malignant neoplasm of lower lobe, unspecified bronchus or lung: Secondary | ICD-10-CM | POA: Diagnosis not present

## 2018-09-03 DIAGNOSIS — R32 Unspecified urinary incontinence: Secondary | ICD-10-CM | POA: Diagnosis not present

## 2018-09-03 DIAGNOSIS — Z85118 Personal history of other malignant neoplasm of bronchus and lung: Secondary | ICD-10-CM | POA: Diagnosis not present

## 2018-09-03 DIAGNOSIS — M7730 Calcaneal spur, unspecified foot: Secondary | ICD-10-CM | POA: Diagnosis not present

## 2018-09-03 DIAGNOSIS — M75101 Unspecified rotator cuff tear or rupture of right shoulder, not specified as traumatic: Secondary | ICD-10-CM | POA: Diagnosis not present

## 2018-09-03 DIAGNOSIS — N401 Enlarged prostate with lower urinary tract symptoms: Secondary | ICD-10-CM | POA: Diagnosis not present

## 2018-09-03 DIAGNOSIS — I5030 Unspecified diastolic (congestive) heart failure: Secondary | ICD-10-CM | POA: Diagnosis not present

## 2018-09-03 DIAGNOSIS — F322 Major depressive disorder, single episode, severe without psychotic features: Secondary | ICD-10-CM | POA: Diagnosis not present

## 2018-09-03 DIAGNOSIS — F329 Major depressive disorder, single episode, unspecified: Secondary | ICD-10-CM | POA: Diagnosis not present

## 2018-09-03 DIAGNOSIS — I10 Essential (primary) hypertension: Secondary | ICD-10-CM | POA: Diagnosis not present

## 2018-09-03 DIAGNOSIS — N400 Enlarged prostate without lower urinary tract symptoms: Secondary | ICD-10-CM | POA: Diagnosis not present

## 2018-09-03 DIAGNOSIS — M170 Bilateral primary osteoarthritis of knee: Secondary | ICD-10-CM | POA: Diagnosis not present

## 2018-09-03 DIAGNOSIS — F418 Other specified anxiety disorders: Secondary | ICD-10-CM | POA: Diagnosis not present

## 2018-09-03 DIAGNOSIS — E785 Hyperlipidemia, unspecified: Secondary | ICD-10-CM | POA: Diagnosis not present

## 2018-09-03 DIAGNOSIS — E782 Mixed hyperlipidemia: Secondary | ICD-10-CM | POA: Diagnosis not present

## 2018-09-03 DIAGNOSIS — J449 Chronic obstructive pulmonary disease, unspecified: Secondary | ICD-10-CM | POA: Diagnosis not present

## 2018-09-03 DIAGNOSIS — J441 Chronic obstructive pulmonary disease with (acute) exacerbation: Secondary | ICD-10-CM | POA: Diagnosis not present

## 2018-09-03 DIAGNOSIS — I110 Hypertensive heart disease with heart failure: Secondary | ICD-10-CM | POA: Diagnosis not present

## 2018-09-03 DIAGNOSIS — I2510 Atherosclerotic heart disease of native coronary artery without angina pectoris: Secondary | ICD-10-CM | POA: Diagnosis not present

## 2018-09-03 DIAGNOSIS — I252 Old myocardial infarction: Secondary | ICD-10-CM | POA: Diagnosis not present

## 2018-09-07 DIAGNOSIS — I252 Old myocardial infarction: Secondary | ICD-10-CM | POA: Diagnosis not present

## 2018-09-07 DIAGNOSIS — I2510 Atherosclerotic heart disease of native coronary artery without angina pectoris: Secondary | ICD-10-CM | POA: Diagnosis not present

## 2018-09-07 DIAGNOSIS — E785 Hyperlipidemia, unspecified: Secondary | ICD-10-CM | POA: Diagnosis not present

## 2018-09-07 DIAGNOSIS — I110 Hypertensive heart disease with heart failure: Secondary | ICD-10-CM | POA: Diagnosis not present

## 2018-09-07 DIAGNOSIS — I5030 Unspecified diastolic (congestive) heart failure: Secondary | ICD-10-CM | POA: Diagnosis not present

## 2018-09-07 DIAGNOSIS — J449 Chronic obstructive pulmonary disease, unspecified: Secondary | ICD-10-CM | POA: Diagnosis not present

## 2018-09-10 DIAGNOSIS — I2510 Atherosclerotic heart disease of native coronary artery without angina pectoris: Secondary | ICD-10-CM | POA: Diagnosis not present

## 2018-09-10 DIAGNOSIS — I110 Hypertensive heart disease with heart failure: Secondary | ICD-10-CM | POA: Diagnosis not present

## 2018-09-10 DIAGNOSIS — J449 Chronic obstructive pulmonary disease, unspecified: Secondary | ICD-10-CM | POA: Diagnosis not present

## 2018-09-10 DIAGNOSIS — I252 Old myocardial infarction: Secondary | ICD-10-CM | POA: Diagnosis not present

## 2018-09-10 DIAGNOSIS — I5030 Unspecified diastolic (congestive) heart failure: Secondary | ICD-10-CM | POA: Diagnosis not present

## 2018-09-10 DIAGNOSIS — E785 Hyperlipidemia, unspecified: Secondary | ICD-10-CM | POA: Diagnosis not present

## 2018-09-14 DIAGNOSIS — E785 Hyperlipidemia, unspecified: Secondary | ICD-10-CM | POA: Diagnosis not present

## 2018-09-14 DIAGNOSIS — I110 Hypertensive heart disease with heart failure: Secondary | ICD-10-CM | POA: Diagnosis not present

## 2018-09-14 DIAGNOSIS — I5030 Unspecified diastolic (congestive) heart failure: Secondary | ICD-10-CM | POA: Diagnosis not present

## 2018-09-14 DIAGNOSIS — J449 Chronic obstructive pulmonary disease, unspecified: Secondary | ICD-10-CM | POA: Diagnosis not present

## 2018-09-14 DIAGNOSIS — I2510 Atherosclerotic heart disease of native coronary artery without angina pectoris: Secondary | ICD-10-CM | POA: Diagnosis not present

## 2018-09-14 DIAGNOSIS — I252 Old myocardial infarction: Secondary | ICD-10-CM | POA: Diagnosis not present

## 2018-09-17 DIAGNOSIS — E785 Hyperlipidemia, unspecified: Secondary | ICD-10-CM | POA: Diagnosis not present

## 2018-09-17 DIAGNOSIS — I2510 Atherosclerotic heart disease of native coronary artery without angina pectoris: Secondary | ICD-10-CM | POA: Diagnosis not present

## 2018-09-17 DIAGNOSIS — J449 Chronic obstructive pulmonary disease, unspecified: Secondary | ICD-10-CM | POA: Diagnosis not present

## 2018-09-17 DIAGNOSIS — I5030 Unspecified diastolic (congestive) heart failure: Secondary | ICD-10-CM | POA: Diagnosis not present

## 2018-09-17 DIAGNOSIS — I110 Hypertensive heart disease with heart failure: Secondary | ICD-10-CM | POA: Diagnosis not present

## 2018-09-17 DIAGNOSIS — I252 Old myocardial infarction: Secondary | ICD-10-CM | POA: Diagnosis not present

## 2018-09-21 DIAGNOSIS — I5030 Unspecified diastolic (congestive) heart failure: Secondary | ICD-10-CM | POA: Diagnosis not present

## 2018-09-21 DIAGNOSIS — I2510 Atherosclerotic heart disease of native coronary artery without angina pectoris: Secondary | ICD-10-CM | POA: Diagnosis not present

## 2018-09-21 DIAGNOSIS — J449 Chronic obstructive pulmonary disease, unspecified: Secondary | ICD-10-CM | POA: Diagnosis not present

## 2018-09-21 DIAGNOSIS — I252 Old myocardial infarction: Secondary | ICD-10-CM | POA: Diagnosis not present

## 2018-09-21 DIAGNOSIS — E785 Hyperlipidemia, unspecified: Secondary | ICD-10-CM | POA: Diagnosis not present

## 2018-09-21 DIAGNOSIS — I110 Hypertensive heart disease with heart failure: Secondary | ICD-10-CM | POA: Diagnosis not present

## 2018-09-24 DIAGNOSIS — J449 Chronic obstructive pulmonary disease, unspecified: Secondary | ICD-10-CM | POA: Diagnosis not present

## 2018-09-24 DIAGNOSIS — I252 Old myocardial infarction: Secondary | ICD-10-CM | POA: Diagnosis not present

## 2018-09-24 DIAGNOSIS — I2510 Atherosclerotic heart disease of native coronary artery without angina pectoris: Secondary | ICD-10-CM | POA: Diagnosis not present

## 2018-09-24 DIAGNOSIS — E785 Hyperlipidemia, unspecified: Secondary | ICD-10-CM | POA: Diagnosis not present

## 2018-09-24 DIAGNOSIS — I110 Hypertensive heart disease with heart failure: Secondary | ICD-10-CM | POA: Diagnosis not present

## 2018-09-24 DIAGNOSIS — I5030 Unspecified diastolic (congestive) heart failure: Secondary | ICD-10-CM | POA: Diagnosis not present

## 2018-09-28 DIAGNOSIS — J449 Chronic obstructive pulmonary disease, unspecified: Secondary | ICD-10-CM | POA: Diagnosis not present

## 2018-09-28 DIAGNOSIS — E785 Hyperlipidemia, unspecified: Secondary | ICD-10-CM | POA: Diagnosis not present

## 2018-09-28 DIAGNOSIS — I110 Hypertensive heart disease with heart failure: Secondary | ICD-10-CM | POA: Diagnosis not present

## 2018-09-28 DIAGNOSIS — I252 Old myocardial infarction: Secondary | ICD-10-CM | POA: Diagnosis not present

## 2018-09-28 DIAGNOSIS — I5030 Unspecified diastolic (congestive) heart failure: Secondary | ICD-10-CM | POA: Diagnosis not present

## 2018-09-28 DIAGNOSIS — I2510 Atherosclerotic heart disease of native coronary artery without angina pectoris: Secondary | ICD-10-CM | POA: Diagnosis not present

## 2018-09-30 DIAGNOSIS — I252 Old myocardial infarction: Secondary | ICD-10-CM | POA: Diagnosis not present

## 2018-09-30 DIAGNOSIS — I5030 Unspecified diastolic (congestive) heart failure: Secondary | ICD-10-CM | POA: Diagnosis not present

## 2018-09-30 DIAGNOSIS — Z87891 Personal history of nicotine dependence: Secondary | ICD-10-CM | POA: Diagnosis not present

## 2018-09-30 DIAGNOSIS — M75101 Unspecified rotator cuff tear or rupture of right shoulder, not specified as traumatic: Secondary | ICD-10-CM | POA: Diagnosis not present

## 2018-09-30 DIAGNOSIS — F418 Other specified anxiety disorders: Secondary | ICD-10-CM | POA: Diagnosis not present

## 2018-09-30 DIAGNOSIS — K219 Gastro-esophageal reflux disease without esophagitis: Secondary | ICD-10-CM | POA: Diagnosis not present

## 2018-09-30 DIAGNOSIS — K273 Acute peptic ulcer, site unspecified, without hemorrhage or perforation: Secondary | ICD-10-CM | POA: Diagnosis not present

## 2018-09-30 DIAGNOSIS — Z8781 Personal history of (healed) traumatic fracture: Secondary | ICD-10-CM | POA: Diagnosis not present

## 2018-09-30 DIAGNOSIS — M7730 Calcaneal spur, unspecified foot: Secondary | ICD-10-CM | POA: Diagnosis not present

## 2018-09-30 DIAGNOSIS — J449 Chronic obstructive pulmonary disease, unspecified: Secondary | ICD-10-CM | POA: Diagnosis not present

## 2018-09-30 DIAGNOSIS — I2510 Atherosclerotic heart disease of native coronary artery without angina pectoris: Secondary | ICD-10-CM | POA: Diagnosis not present

## 2018-09-30 DIAGNOSIS — I110 Hypertensive heart disease with heart failure: Secondary | ICD-10-CM | POA: Diagnosis not present

## 2018-09-30 DIAGNOSIS — M170 Bilateral primary osteoarthritis of knee: Secondary | ICD-10-CM | POA: Diagnosis not present

## 2018-09-30 DIAGNOSIS — Z8701 Personal history of pneumonia (recurrent): Secondary | ICD-10-CM | POA: Diagnosis not present

## 2018-09-30 DIAGNOSIS — Z95818 Presence of other cardiac implants and grafts: Secondary | ICD-10-CM | POA: Diagnosis not present

## 2018-09-30 DIAGNOSIS — R32 Unspecified urinary incontinence: Secondary | ICD-10-CM | POA: Diagnosis not present

## 2018-09-30 DIAGNOSIS — N400 Enlarged prostate without lower urinary tract symptoms: Secondary | ICD-10-CM | POA: Diagnosis not present

## 2018-09-30 DIAGNOSIS — K5790 Diverticulosis of intestine, part unspecified, without perforation or abscess without bleeding: Secondary | ICD-10-CM | POA: Diagnosis not present

## 2018-09-30 DIAGNOSIS — Z85828 Personal history of other malignant neoplasm of skin: Secondary | ICD-10-CM | POA: Diagnosis not present

## 2018-09-30 DIAGNOSIS — H9190 Unspecified hearing loss, unspecified ear: Secondary | ICD-10-CM | POA: Diagnosis not present

## 2018-09-30 DIAGNOSIS — Z7982 Long term (current) use of aspirin: Secondary | ICD-10-CM | POA: Diagnosis not present

## 2018-09-30 DIAGNOSIS — Z923 Personal history of irradiation: Secondary | ICD-10-CM | POA: Diagnosis not present

## 2018-09-30 DIAGNOSIS — E785 Hyperlipidemia, unspecified: Secondary | ICD-10-CM | POA: Diagnosis not present

## 2018-09-30 DIAGNOSIS — Z85118 Personal history of other malignant neoplasm of bronchus and lung: Secondary | ICD-10-CM | POA: Diagnosis not present

## 2018-09-30 DIAGNOSIS — K5900 Constipation, unspecified: Secondary | ICD-10-CM | POA: Diagnosis not present

## 2018-10-01 DIAGNOSIS — E782 Mixed hyperlipidemia: Secondary | ICD-10-CM | POA: Diagnosis not present

## 2018-10-01 DIAGNOSIS — I2510 Atherosclerotic heart disease of native coronary artery without angina pectoris: Secondary | ICD-10-CM | POA: Diagnosis not present

## 2018-10-01 DIAGNOSIS — I10 Essential (primary) hypertension: Secondary | ICD-10-CM | POA: Diagnosis not present

## 2018-10-01 DIAGNOSIS — I252 Old myocardial infarction: Secondary | ICD-10-CM | POA: Diagnosis not present

## 2018-10-01 DIAGNOSIS — N401 Enlarged prostate with lower urinary tract symptoms: Secondary | ICD-10-CM | POA: Diagnosis not present

## 2018-10-01 DIAGNOSIS — Z85118 Personal history of other malignant neoplasm of bronchus and lung: Secondary | ICD-10-CM | POA: Diagnosis not present

## 2018-10-01 DIAGNOSIS — F322 Major depressive disorder, single episode, severe without psychotic features: Secondary | ICD-10-CM | POA: Diagnosis not present

## 2018-10-01 DIAGNOSIS — J441 Chronic obstructive pulmonary disease with (acute) exacerbation: Secondary | ICD-10-CM | POA: Diagnosis not present

## 2018-10-01 DIAGNOSIS — N400 Enlarged prostate without lower urinary tract symptoms: Secondary | ICD-10-CM | POA: Diagnosis not present

## 2018-10-01 DIAGNOSIS — J449 Chronic obstructive pulmonary disease, unspecified: Secondary | ICD-10-CM | POA: Diagnosis not present

## 2018-10-01 DIAGNOSIS — C3430 Malignant neoplasm of lower lobe, unspecified bronchus or lung: Secondary | ICD-10-CM | POA: Diagnosis not present

## 2018-10-01 DIAGNOSIS — I5030 Unspecified diastolic (congestive) heart failure: Secondary | ICD-10-CM | POA: Diagnosis not present

## 2018-10-04 ENCOUNTER — Other Ambulatory Visit: Payer: Medicare Other

## 2018-10-07 DIAGNOSIS — I2510 Atherosclerotic heart disease of native coronary artery without angina pectoris: Secondary | ICD-10-CM | POA: Diagnosis not present

## 2018-10-07 DIAGNOSIS — J449 Chronic obstructive pulmonary disease, unspecified: Secondary | ICD-10-CM | POA: Diagnosis not present

## 2018-10-07 DIAGNOSIS — I252 Old myocardial infarction: Secondary | ICD-10-CM | POA: Diagnosis not present

## 2018-10-07 DIAGNOSIS — I110 Hypertensive heart disease with heart failure: Secondary | ICD-10-CM | POA: Diagnosis not present

## 2018-10-07 DIAGNOSIS — I5030 Unspecified diastolic (congestive) heart failure: Secondary | ICD-10-CM | POA: Diagnosis not present

## 2018-10-07 DIAGNOSIS — E785 Hyperlipidemia, unspecified: Secondary | ICD-10-CM | POA: Diagnosis not present

## 2018-10-12 DIAGNOSIS — Z79899 Other long term (current) drug therapy: Secondary | ICD-10-CM | POA: Diagnosis not present

## 2018-10-12 DIAGNOSIS — H6123 Impacted cerumen, bilateral: Secondary | ICD-10-CM | POA: Diagnosis not present

## 2018-10-12 DIAGNOSIS — Z85118 Personal history of other malignant neoplasm of bronchus and lung: Secondary | ICD-10-CM | POA: Diagnosis not present

## 2018-10-12 DIAGNOSIS — Z0001 Encounter for general adult medical examination with abnormal findings: Secondary | ICD-10-CM | POA: Diagnosis not present

## 2018-10-12 DIAGNOSIS — E782 Mixed hyperlipidemia: Secondary | ICD-10-CM | POA: Diagnosis not present

## 2018-10-12 DIAGNOSIS — F322 Major depressive disorder, single episode, severe without psychotic features: Secondary | ICD-10-CM | POA: Diagnosis not present

## 2018-10-12 DIAGNOSIS — I5030 Unspecified diastolic (congestive) heart failure: Secondary | ICD-10-CM | POA: Diagnosis not present

## 2018-10-12 DIAGNOSIS — J449 Chronic obstructive pulmonary disease, unspecified: Secondary | ICD-10-CM | POA: Diagnosis not present

## 2018-10-12 DIAGNOSIS — I10 Essential (primary) hypertension: Secondary | ICD-10-CM | POA: Diagnosis not present

## 2018-10-12 DIAGNOSIS — E559 Vitamin D deficiency, unspecified: Secondary | ICD-10-CM | POA: Diagnosis not present

## 2018-10-12 DIAGNOSIS — M179 Osteoarthritis of knee, unspecified: Secondary | ICD-10-CM | POA: Diagnosis not present

## 2018-10-12 DIAGNOSIS — Z1389 Encounter for screening for other disorder: Secondary | ICD-10-CM | POA: Diagnosis not present

## 2018-10-15 DIAGNOSIS — J449 Chronic obstructive pulmonary disease, unspecified: Secondary | ICD-10-CM | POA: Diagnosis not present

## 2018-10-15 DIAGNOSIS — I110 Hypertensive heart disease with heart failure: Secondary | ICD-10-CM | POA: Diagnosis not present

## 2018-10-15 DIAGNOSIS — I252 Old myocardial infarction: Secondary | ICD-10-CM | POA: Diagnosis not present

## 2018-10-15 DIAGNOSIS — I2510 Atherosclerotic heart disease of native coronary artery without angina pectoris: Secondary | ICD-10-CM | POA: Diagnosis not present

## 2018-10-15 DIAGNOSIS — I5030 Unspecified diastolic (congestive) heart failure: Secondary | ICD-10-CM | POA: Diagnosis not present

## 2018-10-15 DIAGNOSIS — E785 Hyperlipidemia, unspecified: Secondary | ICD-10-CM | POA: Diagnosis not present

## 2018-10-19 ENCOUNTER — Other Ambulatory Visit: Payer: Self-pay

## 2018-10-19 ENCOUNTER — Inpatient Hospital Stay: Payer: Medicare Other | Attending: Hematology & Oncology

## 2018-10-19 VITALS — BP 124/65 | HR 70 | Temp 97.1°F | Resp 18

## 2018-10-19 DIAGNOSIS — Z95828 Presence of other vascular implants and grafts: Secondary | ICD-10-CM

## 2018-10-19 DIAGNOSIS — Z85118 Personal history of other malignant neoplasm of bronchus and lung: Secondary | ICD-10-CM | POA: Insufficient documentation

## 2018-10-19 DIAGNOSIS — Z452 Encounter for adjustment and management of vascular access device: Secondary | ICD-10-CM | POA: Insufficient documentation

## 2018-10-19 MED ORDER — HEPARIN SOD (PORK) LOCK FLUSH 100 UNIT/ML IV SOLN
500.0000 [IU] | Freq: Once | INTRAVENOUS | Status: AC
  Administered 2018-10-19: 500 [IU] via INTRAVENOUS
  Filled 2018-10-19: qty 5

## 2018-10-19 MED ORDER — SODIUM CHLORIDE 0.9% FLUSH
10.0000 mL | INTRAVENOUS | Status: DC | PRN
  Administered 2018-10-19: 10 mL via INTRAVENOUS
  Filled 2018-10-19: qty 10

## 2018-10-26 DIAGNOSIS — I5030 Unspecified diastolic (congestive) heart failure: Secondary | ICD-10-CM | POA: Diagnosis not present

## 2018-10-26 DIAGNOSIS — I2510 Atherosclerotic heart disease of native coronary artery without angina pectoris: Secondary | ICD-10-CM | POA: Diagnosis not present

## 2018-10-26 DIAGNOSIS — I110 Hypertensive heart disease with heart failure: Secondary | ICD-10-CM | POA: Diagnosis not present

## 2018-10-26 DIAGNOSIS — E785 Hyperlipidemia, unspecified: Secondary | ICD-10-CM | POA: Diagnosis not present

## 2018-10-26 DIAGNOSIS — J449 Chronic obstructive pulmonary disease, unspecified: Secondary | ICD-10-CM | POA: Diagnosis not present

## 2018-10-26 DIAGNOSIS — I252 Old myocardial infarction: Secondary | ICD-10-CM | POA: Diagnosis not present

## 2018-11-01 ENCOUNTER — Ambulatory Visit (INDEPENDENT_AMBULATORY_CARE_PROVIDER_SITE_OTHER): Payer: Medicare Other | Admitting: Emergency Medicine

## 2018-11-01 ENCOUNTER — Other Ambulatory Visit: Payer: Self-pay

## 2018-11-01 ENCOUNTER — Encounter: Payer: Self-pay | Admitting: Emergency Medicine

## 2018-11-01 DIAGNOSIS — I259 Chronic ischemic heart disease, unspecified: Secondary | ICD-10-CM | POA: Diagnosis not present

## 2018-11-01 DIAGNOSIS — R061 Stridor: Secondary | ICD-10-CM

## 2018-11-01 DIAGNOSIS — J438 Other emphysema: Secondary | ICD-10-CM | POA: Diagnosis not present

## 2018-11-01 NOTE — Progress Notes (Signed)
Subjective:    Patient ID: [name] Fields, male    DOB: [birth year], 84 y.o.   MRN: [id number]  HPI  ROV 02/23/18 --84 year old man with a history of heavy tobacco, alpha-1 antitrypsin heterozygosity MS genotype and associated severe COPD, multifactorial dyspnea due to this as well as non-small cell lung cancers of the right lower lobe in the left lung treated with chemoradiation, some lingering left-sided interstitial disease and left-sided pleural disease.  FOB at [facility] 08/25/2017 did not show an endobronchial lesion.  He also has significant upper airway irritation and hoarseness, stridor that have been hard to manage, were worse 01/27/2018 when he was last seen here.  Very tough history-giver. Multiple complaints, including some R sided abd pain for the last few days, no constipation. He is bringing up brownish mucous. He c/o persistent hoarse voice and a barking cough.   He is supposed to be on loratadine, has still never started it. Has clear nasal congestion.  He has Stiolto but doesn't use reliably. Has duoneb that he uses 2x a day, doesn't believe it helps him, most concerned about his cough and mucous burden His last pred was 2 months ago by Dr Inda Merlin - last abx were ???  His most recent CT scan of the chest was done on 10/22 which showed stable left-sided posttreatment changes with volume loss and pleural thickening, stable focal right lower lobe consolidation and a new nodularity along the minor fissure extending into the right upper lobe.  This prompted a PET scan which was done on 10/31 which I have reviewed.  This shows some nonspecific tracer uptake in the treated area of the left upper lobe, more nonspecific uptake in the central right lower lobe and an area of chronic masslike architectural distortion, also some increased uptake in the chronic left pleural changes.  ROV 05/24/2018 --[name] is 9 followed for severe obstructive lung disease and alpha-1 antitrypsin heterozygosity  (MS), right lower lobe and left non-small cell lung cancers last treated 2013, chronic upper airway hoarseness.  Last seen 04/06/2018 in our office and bronchodilators at that time included Anoro, albuterol which he uses approximately 2x a day. Uses DuoNeb about once a day. He occasionally forgets the Anoro apparently - feels that it may be helping him. He has a lot of confusion about the meds. No flares since last visit here. He will need a repeat Ct chest at the 6 month mark >> May 2020. Minimal cough.   ROV 11/01/2018 --84 year old gentleman with a history of prior heavy tobacco and alpha-1 antitrypsin heterozygosity (MS) with associated severe COPD.  Also with non-small cell lung cancer of the right lower lobe and left lung.  Course complicated by pleural and pericardial effusions.  History is otherwise significant for coronary disease with PTCI.   Today he reports that he has good days and bad. His cough is probably less, still can produce some green phlegm. He does still have some exertional SOB. No wheeze. He can walk about 100 ft with a walker, then stops for a rest.  He notes that he loses balance when he stands quickly.  Has some UA noise. Hasn't been on loratadine, Astelin, mucinex. He uses ProAir - he is unsure whether he is on Anoro reliably. Says he takers a "red one".   Review of Systems As per HPI      Objective:   Physical Exam  Vitals:   11/01/18 0927  BP: 122/74  Pulse: 68  SpO2: 97%  Weight: 172  lb (78 kg)  Height: 5\' 10"  (1.778 m)   Gen: Pleasant, elderly gentleman, in no distress,  normal affect  ENT: No lesions,  mouth clear,  oropharynx clear, no postnasal drip, hearing aids in place, strong voice  Neck: No JVD, some UA stridor on a deep insp and exp  Lungs: No use of accessory muscles, referred UA noise, no overt wheeze  Cardiovascular: RRR, heart sounds normal, no murmur or gallops, no peripheral edema  Musculoskeletal: No deformities, no cyanosis or clubbing   Neuro: alert, non focal  Skin: Warm, no lesions or rash     Assessment & Plan:  COPD (chronic obstructive pulmonary disease) with emphysema (HCC) We will try to confirm today which inhalers you are taking reliably at home Would like for you to use Anoro once daily every day. Keep albuterol (ProAir air) available to use 2 puffs if needed for shortness of breath, chest tightness, wheezing. Follow with Dr Lamonte Sakai in 6 months or sooner if you have any problems  Stridor With contributions from GERD, chronic rhinitis.  Not currently on a rhinitis regimen.  When I discussed with him he did not want to initiate, did not think the upper air noise was limiting him.  If you nasal congestion and throat irritation, noise worsen you could consider going back on your loratadine (Claritin) or restart Astelin nasal spray.  Baltazar Apo, MD, PhD 11/01/2018, 9:45 AM [facility] [phone] or if no answer [phone]

## 2018-11-01 NOTE — Patient Instructions (Signed)
We will try to confirm today which inhalers you are taking reliably at home Would like for you to use Anoro once daily every day. Keep albuterol (ProAir air) available to use 2 puffs if needed for shortness of breath, chest tightness, wheezing. If you nasal congestion and throat irritation, noise worsen you could consider going back on your loratadine (Claritin) or restart Astelin nasal spray. Follow with Dr Lamonte Sakai in 6 months or sooner if you have any problems

## 2018-11-01 NOTE — Assessment & Plan Note (Addendum)
With contributions from GERD, chronic rhinitis.  Not currently on a rhinitis regimen.  When I discussed with him he did not want to initiate, did not think the upper air noise was limiting him.  If you nasal congestion and throat irritation, noise worsen you could consider going back on your loratadine (Claritin) or restart Astelin nasal spray.

## 2018-11-01 NOTE — Assessment & Plan Note (Signed)
We will try to confirm today which inhalers you are taking reliably at home Would like for you to use Anoro once daily every day. Keep albuterol (ProAir air) available to use 2 puffs if needed for shortness of breath, chest tightness, wheezing. Follow with Dr Lamonte Sakai in 6 months or sooner if you have any problems

## 2018-11-05 DIAGNOSIS — F329 Major depressive disorder, single episode, unspecified: Secondary | ICD-10-CM | POA: Diagnosis not present

## 2018-11-05 DIAGNOSIS — J449 Chronic obstructive pulmonary disease, unspecified: Secondary | ICD-10-CM | POA: Diagnosis not present

## 2018-11-05 DIAGNOSIS — C3430 Malignant neoplasm of lower lobe, unspecified bronchus or lung: Secondary | ICD-10-CM | POA: Diagnosis not present

## 2018-11-05 DIAGNOSIS — Z85118 Personal history of other malignant neoplasm of bronchus and lung: Secondary | ICD-10-CM | POA: Diagnosis not present

## 2018-11-05 DIAGNOSIS — J441 Chronic obstructive pulmonary disease with (acute) exacerbation: Secondary | ICD-10-CM | POA: Diagnosis not present

## 2018-11-05 DIAGNOSIS — I2510 Atherosclerotic heart disease of native coronary artery without angina pectoris: Secondary | ICD-10-CM | POA: Diagnosis not present

## 2018-11-05 DIAGNOSIS — N400 Enlarged prostate without lower urinary tract symptoms: Secondary | ICD-10-CM | POA: Diagnosis not present

## 2018-11-05 DIAGNOSIS — I10 Essential (primary) hypertension: Secondary | ICD-10-CM | POA: Diagnosis not present

## 2018-11-05 DIAGNOSIS — I252 Old myocardial infarction: Secondary | ICD-10-CM | POA: Diagnosis not present

## 2018-11-05 DIAGNOSIS — I5030 Unspecified diastolic (congestive) heart failure: Secondary | ICD-10-CM | POA: Diagnosis not present

## 2018-11-05 DIAGNOSIS — F322 Major depressive disorder, single episode, severe without psychotic features: Secondary | ICD-10-CM | POA: Diagnosis not present

## 2018-11-05 DIAGNOSIS — E782 Mixed hyperlipidemia: Secondary | ICD-10-CM | POA: Diagnosis not present

## 2018-11-21 NOTE — Progress Notes (Signed)
CARDIOLOGY OFFICE NOTE  Date:  11/24/2018    [name] Fields Date of Birth: Sep 30, 1934 Medical Record #[id number]  PCP:  [name] Huddle, MD  Cardiologist:  [name] Fields   Chief Complaint  Patient presents with  . Follow-up    History of Present Illness: [name] Fields is a 84 y.o. male who presents today for a follow up visit. Seen for Dr. Marlou Fields. He is a former patient of Dr. Susa Fields that I cared for for many years.I typically see him.  He has a history of coronary artery disease with prior lateral wall MI status post multiple stents in the past. Other issues include lung cancer in both sides (treated with radiation), melanomaof the left cheeck, fibrosis, recurrent pleural effusion. He has had a pericardial window for an effusion back in 2011.   I saw him back in July of 2017- he had been referred back for shortness of breath. Productive cough - COPD - I sent him back to pulmonary for evaluation/treatment - seemed more pulmonary related/radiation effects. Did not understand how to use his Spiriva. Some swelling - getting too much salt. I added some low dose Lasix. He was better on return but has remained very limited by his breathing.  Last seen here in the office back in October of 2019 - lots of issues - very short of breath - lot of cough - had his CT and ended up getting a PET. Had had a compression fracture. Very limited due to his breathing.   Last seen in May by a telehealth visit - had had a fall. Remained pretty short of breath.   The patient does not have symptoms concerning for COVID-19 infection (fever, chills, cough, or new shortness of breath).   Comes in today. Here alone. He says he feels pretty good - but "ready for this (COVID) to be over". He is very hard of hearing. He is slow to move or he will fall. Sounds like he has had several falls. He does not have a lifeline - but has "been told to get one". His sister does check on him every day - she is  getting his groceries.  Limited by his breathing. He has not been going out of the house. His weight continues to drop. He says he is eating less. He is having lots of urinary frequency from the Lasix - this seems to be contributing to falls. No swelling. He is mixed up on his dose of Metoprolol - taking it all at one time.   Past Medical History:  Diagnosis Date  . Anxiety    takes wellbutrin daily  . Arthritis    knees  . COPD (chronic obstructive pulmonary disease) ([place])   . Diverticulosis   . Emphysema of lung ([place])   . GERD (gastroesophageal reflux disease)   . Hearing loss   . Hx of radiation therapy 09/29/11;10/01/11;10/03/11;10/06/11;10/08/11;   RLLlung,50Gy/35fx - SBRT  . Hyperlipidemia   . Hypertension   . Hypokinesia    MILD INFERIOR  . IHD (ischemic heart disease)    Remote MI in 2001 with stent to the LCX  . Lactose intolerance   . Non-small cell carcinoma of lung ([place])    Dx in October of 2010  . Normal nuclear stress test 2009   EF 63%. No ischemia. 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  . Pericardial effusion July 2011   s/p pericardial window; last echo in August 2012 with minimal effusion  . Pleural effusion, left   .  SOB (shortness of breath)    Chronic with known fibrosis  . Status post chemotherapy 06/04/2009 -  09/06/2009    4 cycles of Cisplatin and Gemcitabine with Neulasta Support   . Vertigo    hx. of- ambulates with cane for safety.    Past Surgical History:  Procedure Laterality Date  . CARDIAC CATHETERIZATION  07/11/2003   EF 60%, REPEAT, WHICH SHOWED 30% PROXIMAL NARROWING IN THE RIGHT  CORONARY ARTERY  . CARDIOVASCULAR STRESS TEST  05/2007   EF 63%  . CATARACT EXTRACTION, BILATERAL Bilateral   . COLONOSCOPY WITH PROPOFOL N/A 01/30/2015   Procedure: COLONOSCOPY WITH PROPOFOL;  Surgeon: [name] Fair, MD;  Location: [facility];  Service: Endoscopy;  Laterality: N/A;  . CORONARY STENT PLACEMENT  07/1999   STENT TO THE LEFT CIRCUMFLEX  . HERNIA REPAIR     as an  adult- R inguinal   . PERICARDIAL WINDOW  July 2011  . TRANSTHORACIC ECHOCARDIOGRAM  02/22/2010   EF 55-60%     Medications: Current Meds  Medication Sig  . aspirin EC 81 MG tablet Take 81 mg by mouth daily.  Marland Kitchen atorvastatin (LIPITOR) 20 MG tablet Take 20 mg by mouth daily.  Marland Kitchen buPROPion (WELLBUTRIN SR) 150 MG 12 hr tablet Take 150 mg by mouth daily.   . Cholecalciferol (VITAMIN D3) 2000 units TABS Take 2,000 Units by mouth 2 (two) times daily.   . finasteride (PROSCAR) 5 MG tablet Take 5 mg by mouth daily.  . furosemide (LASIX) 20 MG tablet Take 20 mg by mouth every other day. Monday,wednesday, Friday and Saturday only.   Marland Kitchen guaiFENesin (MUCINEX) 600 MG 12 hr tablet Take 1 tablet (600 mg total) by mouth 2 (two) times daily.  Marland Kitchen loratadine (CLARITIN) 10 MG tablet Take 1 tablet (10 mg total) by mouth daily.  . pantoprazole (PROTONIX) 40 MG tablet Take 1 tablet by mouth daily.  . Tamsulosin HCl (FLOMAX) 0.4 MG CAPS Take 0.4 mg by mouth daily with supper.   . temazepam (RESTORIL) 30 MG capsule Take 30 mg by mouth at bedtime  . venlafaxine XR (EFFEXOR-XR) 150 MG 24 hr capsule Take 150 mg by mouth daily.      Allergies: Allergies  Allergen Reactions  . Sertraline Hcl     Patient does not remember the reaction    Social History: The patient  reports that he quit smoking about 24 years ago. His smoking use included cigarettes. He has a 120.00 pack-year smoking history. He has never used smokeless tobacco. He reports that he does not drink alcohol or use drugs.   Family History: The patient's family history includes Lung cancer in his father; Stroke in his sister.   Review of Systems: Please see the history of present illness.   All other systems are reviewed and negative.   Physical Exam: VS:  BP 122/72 (BP Location: Left Arm, Patient Position: Sitting, Cuff Size: Normal)   Pulse 85   Ht 5\' 10"  (1.778 m)   Wt 173 lb (78.5 kg)   SpO2 90% Comment: walking in  BMI 24.82 kg/m  .  BMI  Body mass index is 24.82 kg/m.  Wt Readings from Last 3 Encounters:  11/24/18 173 lb (78.5 kg)  11/01/18 172 lb (78 kg)  07/19/18 180 lb (81.6 kg)    General: Pleasant. He looks chronically ill - he looks much thinner. Alert and in no acute distress.  He is down from 199# back in October of 2019.  HEENT: Normal. But  he is very hard of hearing.  Neck: Supple, no JVD, carotid bruits, or masses noted.  Cardiac: Regular rate and rhythm. Outflow murmur noted. No edema.  Respiratory:  Lungs are clear to auscultation bilaterally with normal work of breathing.  GI: Soft and nontender.  MS: No deformity or atrophy. Gait and ROM intact but unsteady. Using a cane.  Skin: Warm and dry. Color is normal.  Neuro:  Strength and sensation are intact and no gross focal deficits noted.  Psych: Alert, appropriate and with normal affect.   LABORATORY DATA:  EKG:  EKG is not ordered today.  Lab Results  Component Value Date   WBC 6.6 06/15/2018   HGB 14.3 06/15/2018   HCT 45.3 06/15/2018   PLT 273 06/15/2018   GLUCOSE 110 (H) 06/15/2018   CHOL 147 06/24/2016   TRIG 158 (H) 06/24/2016   HDL 70 06/24/2016   LDLCALC 45 06/24/2016   ALT 14 06/15/2018   AST 19 06/15/2018   NA 140 06/15/2018   K 4.1 06/15/2018   CL 102 06/15/2018   CREATININE 0.94 06/15/2018   BUN 17 06/15/2018   CO2 30 06/15/2018   TSH 5.020 (H) 06/24/2016   INR 1.17 12/20/2014   HGBA1C 5.6 12/20/2014        BNP (last 3 results) No results for input(s): BNP in the last 8760 hours.  ProBNP (last 3 results) No results for input(s): PROBNP in the last 8760 hours.   Other Studies Reviewed Today:  PET 01/2018 IMPRESSION: 1. No specific findings highly worrisome for residual or recurrent tumor identified. 2. Within the area of treated tumor in the paramediastinal anterior left upper lung there is a focal area of increased tracer uptake, SUV max 6.87 above background activity (of treated tumor) without specific  anatomic correlate. This is a nonspecific finding but warrants close attention on follow-up imaging to exclude local tumor recurrence. 3. Mild nonspecific uptake localizes to the area of progressive chronic masslike architectural distortion within the central right lower lobe. SUV max equals 3.49. 4. Stable 5 mm nodule within the anterior left lung which is too small to reliably characterize by PET-CT. 5. Chronic loculated left pleural fluid with overlying pleural thickening and mild increased uptake localizing to the thickened pleura. Increased uptake in this area is nonspecific in may reflect chronic inflammatory changes. Given the relative stability of the volume of pleural fluid malignant pleural effusion is considered less favored. 6. Aortic Atherosclerosis (ICD10-I70.0). Multi vessel coronary artery atherosclerotic calcifications noted. 7. Gallstones.   Electronically Signed By: Kerby Moors M.D. On: 02/04/2018 09:01  CT CHEST 01/2018 IMPRESSION: 1. Post treatment changes in the LEFT hilar region, stable over multiple prior studies. Stable LEFT pleural thickening and volume loss. 2. 3. Stable focal consolidation in the RIGHT LOWER lobe. 4. New nodularity along the minor fissure and extending into the RIGHT UPPER lobe warranting follow-up. Considerations include subpleural lymph nodes or tumor. 5. Stable small pericardial effusion. 6. Pulmonary artery enlargement consistent with pulmonary arterial hypertension. 7. Cholelithiasis. 8. Emphysema (ICD10-J43.9). 9. Aortic Atherosclerosis (ICD10-I70.0).   Electronically Signed By: Nolon Nations M.D. On: 01/26/2018 22:13  EchoStudy Conclusions4/2019  - Left ventricle: The cavity size was normal. Wall thickness was increased in a pattern of mild LVH. Systolic function was normal. The estimated ejection fraction was in the range of 55% to 60%. Although no diagnostic regional wall motion  abnormality was identified, this possibility cannot be completely excluded on the basis of this study. Features are consistent with a pseudonormal  left ventricular filling pattern, with concomitant abnormal relaxation and increased filling pressure (grade 2 diastolic dysfunction). - Aortic valve: Noncoronary cusp mobility was moderately restricted. There was mild regurgitation. Valve area (VTI): 2.61 cm^2. - Mitral valve: There was moderate regurgitation directed centrally. Diastolic regurgitation was present. - Left atrium: The atrium was moderately dilated. - Right ventricle: The cavity size was mildly dilated. Systolic function was mildly reduced. - Right atrium: The atrium was mildly dilated. - Tricuspid valve: There was mild regurgitation. Diastolic regurgitation was present. - Pulmonary arteries: Systolic pressure was mildly increased. PA peak pressure: 39 mm Hg (S). - Pericardium, extracardiac: A trivial pericardial effusion was identified.   Myoview Study Highlights from 04/2015   Nuclear stress EF: 56%.  The study is normal.  This is a low risk study.  The left ventricular ejection fraction is normal (55-65%).  Normal resting and stress perfusion EF 56%      ASSESSMENT & PLAN:     1. Chronic shortness of breath - this is multifactorial - prior lung cancer/radiation/emphysema - seems to be borderline with needing oxygen. Overall situation quite tenuous at best.   2. CAD - no active chest pain - would favor conservative management. BP is soft - I am stopping Metoprolol today. Cutting back lasix as well.     3.Multiple falls - this is persistent. Balance is very poor. He is high risk for injury.   4. Past pericardial effusion - s/p window - lastCT findings basically unchanged.Last echo noted just trivial effusion.Not discussed today.   5. Prior lung cancers/melanoma - followed byDr. Marin Olp in [place].     6.  HLD-remains on statin therapy.      7. Diastolic dysfunction - he has good BP control - actually soft. No overt CHF - I do not think he needs this much Lasix - cutting back today. I think our goal going forward needs to be safety.      8. COVID-19 Education: The signs and symptoms of COVID-19 were discussed with the patient and how to seek care for testing (follow up with PCP or arrange E-visit).  The importance of social distancing, staying at home, hand hygiene and wearing a mask when out in public were discussed today.  Current medicines are reviewed with the patient today.  The patient does not have concerns regarding medicines other than what has been noted above.  The following changes have been made:  See above.  Labs/ tests ordered today include:   No orders of the defined types were placed in this encounter.    Disposition:   FU with me in about 3 months. Overall prognosis tenuous at best.   Patient is agreeable to this plan and will call if any problems develop in the interim.   SignedTruitt Merle, NP  11/24/2018 11:57 AM  [facility] [address] [place], [place]  [address] Phone: [phone] Fax: [phone]

## 2018-11-24 ENCOUNTER — Ambulatory Visit (INDEPENDENT_AMBULATORY_CARE_PROVIDER_SITE_OTHER): Payer: Medicare Other | Admitting: Nurse Practitioner

## 2018-11-24 ENCOUNTER — Encounter: Payer: Self-pay | Admitting: Nurse Practitioner

## 2018-11-24 ENCOUNTER — Other Ambulatory Visit: Payer: Self-pay

## 2018-11-24 VITALS — BP 122/72 | HR 85 | Ht 70.0 in | Wt 173.0 lb

## 2018-11-24 DIAGNOSIS — I259 Chronic ischemic heart disease, unspecified: Secondary | ICD-10-CM | POA: Diagnosis not present

## 2018-11-24 DIAGNOSIS — Z85118 Personal history of other malignant neoplasm of bronchus and lung: Secondary | ICD-10-CM

## 2018-11-24 DIAGNOSIS — E7800 Pure hypercholesterolemia, unspecified: Secondary | ICD-10-CM

## 2018-11-24 DIAGNOSIS — R0602 Shortness of breath: Secondary | ICD-10-CM | POA: Diagnosis not present

## 2018-11-24 DIAGNOSIS — J438 Other emphysema: Secondary | ICD-10-CM | POA: Diagnosis not present

## 2018-11-24 DIAGNOSIS — Z7189 Other specified counseling: Secondary | ICD-10-CM | POA: Diagnosis not present

## 2018-11-24 NOTE — Patient Instructions (Addendum)
After Visit Summary:  We will be checking the following labs today - NONE   Medication Instructions:    Continue with your current medicines. BUT  I am stopping Metoprolol  I am cutting the Lasix back to just 20 mg every other day   If you need a refill on your cardiac medications before your next appointment, please call your pharmacy.     Testing/Procedures To Be Arranged:  N/A  Follow-Up:   See me in about 3 months     At [facility], you and your health needs are our priority.  As part of our continuing mission to provide you with exceptional heart care, we have created designated Provider Care Teams.  These Care Teams include your primary Cardiologist (physician) and Advanced Practice Providers (APPs -  Physician Assistants and Nurse Practitioners) who all work together to provide you with the care you need, when you need it.  Special Instructions:  . Stay safe, stay home, wash your hands for at least 20 seconds and wear a mask when out in public.  . It was good to talk with you today.  . Get a Life Line if you can - this will help keep you safe.    Call the [place] office at [phone] if you have any questions, problems or concerns.

## 2018-12-01 DIAGNOSIS — I10 Essential (primary) hypertension: Secondary | ICD-10-CM | POA: Diagnosis not present

## 2018-12-01 DIAGNOSIS — F322 Major depressive disorder, single episode, severe without psychotic features: Secondary | ICD-10-CM | POA: Diagnosis not present

## 2018-12-01 DIAGNOSIS — E7800 Pure hypercholesterolemia, unspecified: Secondary | ICD-10-CM | POA: Diagnosis not present

## 2018-12-01 DIAGNOSIS — M179 Osteoarthritis of knee, unspecified: Secondary | ICD-10-CM | POA: Diagnosis not present

## 2018-12-01 DIAGNOSIS — E782 Mixed hyperlipidemia: Secondary | ICD-10-CM | POA: Diagnosis not present

## 2018-12-01 DIAGNOSIS — J449 Chronic obstructive pulmonary disease, unspecified: Secondary | ICD-10-CM | POA: Diagnosis not present

## 2018-12-01 DIAGNOSIS — F329 Major depressive disorder, single episode, unspecified: Secondary | ICD-10-CM | POA: Diagnosis not present

## 2018-12-01 DIAGNOSIS — C3430 Malignant neoplasm of lower lobe, unspecified bronchus or lung: Secondary | ICD-10-CM | POA: Diagnosis not present

## 2018-12-01 DIAGNOSIS — I2510 Atherosclerotic heart disease of native coronary artery without angina pectoris: Secondary | ICD-10-CM | POA: Diagnosis not present

## 2018-12-01 DIAGNOSIS — J441 Chronic obstructive pulmonary disease with (acute) exacerbation: Secondary | ICD-10-CM | POA: Diagnosis not present

## 2018-12-01 DIAGNOSIS — I5030 Unspecified diastolic (congestive) heart failure: Secondary | ICD-10-CM | POA: Diagnosis not present

## 2018-12-01 DIAGNOSIS — I252 Old myocardial infarction: Secondary | ICD-10-CM | POA: Diagnosis not present

## 2018-12-21 ENCOUNTER — Other Ambulatory Visit: Payer: Self-pay

## 2018-12-21 ENCOUNTER — Inpatient Hospital Stay: Payer: Medicare Other

## 2018-12-21 ENCOUNTER — Encounter: Payer: Self-pay | Admitting: Hematology & Oncology

## 2018-12-21 ENCOUNTER — Inpatient Hospital Stay: Payer: Medicare Other | Attending: Hematology & Oncology | Admitting: Family

## 2018-12-21 ENCOUNTER — Telehealth: Payer: Self-pay | Admitting: Hematology & Oncology

## 2018-12-21 VITALS — BP 113/62 | HR 60 | Temp 97.0°F | Resp 20 | Wt 176.8 lb

## 2018-12-21 DIAGNOSIS — Z95828 Presence of other vascular implants and grafts: Secondary | ICD-10-CM

## 2018-12-21 DIAGNOSIS — Z85820 Personal history of malignant melanoma of skin: Secondary | ICD-10-CM | POA: Insufficient documentation

## 2018-12-21 DIAGNOSIS — D693 Immune thrombocytopenic purpura: Secondary | ICD-10-CM | POA: Insufficient documentation

## 2018-12-21 DIAGNOSIS — D0330 Melanoma in situ of unspecified part of face: Secondary | ICD-10-CM

## 2018-12-21 DIAGNOSIS — C3492 Malignant neoplasm of unspecified part of left bronchus or lung: Secondary | ICD-10-CM | POA: Diagnosis not present

## 2018-12-21 DIAGNOSIS — Z79899 Other long term (current) drug therapy: Secondary | ICD-10-CM | POA: Insufficient documentation

## 2018-12-21 DIAGNOSIS — Z452 Encounter for adjustment and management of vascular access device: Secondary | ICD-10-CM | POA: Insufficient documentation

## 2018-12-21 DIAGNOSIS — Z7982 Long term (current) use of aspirin: Secondary | ICD-10-CM | POA: Insufficient documentation

## 2018-12-21 DIAGNOSIS — R05 Cough: Secondary | ICD-10-CM

## 2018-12-21 DIAGNOSIS — Z85118 Personal history of other malignant neoplasm of bronchus and lung: Secondary | ICD-10-CM | POA: Diagnosis not present

## 2018-12-21 DIAGNOSIS — C3431 Malignant neoplasm of lower lobe, right bronchus or lung: Secondary | ICD-10-CM | POA: Diagnosis not present

## 2018-12-21 DIAGNOSIS — I259 Chronic ischemic heart disease, unspecified: Secondary | ICD-10-CM

## 2018-12-21 DIAGNOSIS — R059 Cough, unspecified: Secondary | ICD-10-CM

## 2018-12-21 LAB — CMP (CANCER CENTER ONLY)
ALT: 11 U/L (ref 0–44)
AST: 17 U/L (ref 15–41)
Albumin: 4 g/dL (ref 3.5–5.0)
Alkaline Phosphatase: 152 U/L — ABNORMAL HIGH (ref 38–126)
Anion gap: 6 (ref 5–15)
BUN: 25 mg/dL — ABNORMAL HIGH (ref 8–23)
CO2: 30 mmol/L (ref 22–32)
Calcium: 9.2 mg/dL (ref 8.9–10.3)
Chloride: 105 mmol/L (ref 98–111)
Creatinine: 1.01 mg/dL (ref 0.61–1.24)
GFR, Est AFR Am: >60 mL/min (ref >60)
GFR, Estimated: >60 mL/min (ref >60)
Glucose, Bld: 98 mg/dL (ref 70–99)
Potassium: 4.5 mmol/L (ref 3.5–5.1)
Sodium: 141 mmol/L (ref 135–145)
Total Bilirubin: 0.6 mg/dL (ref 0.3–1.2)
Total Protein: 7 g/dL (ref 6.5–8.1)

## 2018-12-21 LAB — CBC WITH DIFFERENTIAL (CANCER CENTER ONLY)
Abs Immature Granulocytes: 0.01 10*3/uL (ref 0.00–0.07)
Basophils Absolute: 0.1 10*3/uL (ref 0.0–0.1)
Basophils Relative: 1 %
Eosinophils Absolute: 0.2 10*3/uL (ref 0.0–0.5)
Eosinophils Relative: 2 %
HCT: 41.7 % (ref 39.0–52.0)
Hemoglobin: 12.9 g/dL — ABNORMAL LOW (ref 13.0–17.0)
Immature Granulocytes: 0 %
Lymphocytes Relative: 17 %
Lymphs Abs: 1.1 10*3/uL (ref 0.7–4.0)
MCH: 29.1 pg (ref 26.0–34.0)
MCHC: 30.9 g/dL (ref 30.0–36.0)
MCV: 93.9 fL (ref 80.0–100.0)
Monocytes Absolute: 0.7 10*3/uL (ref 0.1–1.0)
Monocytes Relative: 10 %
Neutro Abs: 4.6 10*3/uL (ref 1.7–7.7)
Neutrophils Relative %: 70 %
Platelet Count: 244 10*3/uL (ref 150–400)
RBC: 4.44 MIL/uL (ref 4.22–5.81)
RDW: 15.2 % (ref 11.5–15.5)
WBC Count: 6.7 10*3/uL (ref 4.0–10.5)
nRBC: 0 % (ref 0.0–0.2)

## 2018-12-21 MED ORDER — HEPARIN SOD (PORK) LOCK FLUSH 100 UNIT/ML IV SOLN
500.0000 [IU] | Freq: Once | INTRAVENOUS | Status: DC
  Filled 2018-12-21: qty 5

## 2018-12-21 MED ORDER — SODIUM CHLORIDE 0.9% FLUSH
10.0000 mL | Freq: Once | INTRAVENOUS | Status: AC
  Administered 2018-12-21: 10 mL via INTRAVENOUS
  Filled 2018-12-21: qty 10

## 2018-12-21 NOTE — Telephone Encounter (Signed)
Called and spoke with patient regarding appointments added per 9/15 los

## 2018-12-21 NOTE — Progress Notes (Signed)
Hematology and Oncology Follow Up Visit  [name] Fields [id number] January 19, 1935 84 y.o. 12/21/2018   Principle Diagnosis:  1. 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 As always, he is very sweet and a pleasure to talk to. He is doing well but states he does have some fatigue.  He has dry patches along the chest and states that this does not itch and is not tender. He will try using hydrocortisone cream and see if this resolves. If not, he plans to follow-up with his dermatologist.  Ne fever, chills, n/v, cough, rash, dizziness, chest pain, palpitations, abdominal pain or changes in bowel or bladder habits.  His SOB with exertion is unchanged from baseline.,  No swelling, tenderness, numbness or tingling in his extremities.  He is ambulating with a rolling walker for added support. He states that he will sometimes stumble and has bumped into walls. He has abrasions on his elbows where he has bumped into things. No syncopal episodes. He states that his PCP would like him to get a life alert necklace which he is still considering.  No episodes of bleeding, no bruising or petechiae.  He states that he is eating well and staying hydrated. He is eating healthier ands smaller portions. His weight is stable.   ECOG Performance Status: 2 - Symptomatic, <50% confined to bed  Medications:  Allergies as of 12/21/2018      Reactions   Sertraline Hcl    Patient does not remember the reaction      Medication List       Accurate as of December 21, 2018 11:35 AM. If you have any questions, ask your nurse or doctor.        aspirin EC 81 MG tablet Take 81 mg by mouth daily.   atorvastatin 20 MG tablet Commonly known as: LIPITOR Take 20 mg by mouth  daily.   buPROPion 150 MG 12 hr tablet Commonly known as: WELLBUTRIN SR Take 150 mg by mouth daily.   finasteride 5 MG tablet Commonly known as: PROSCAR Take 5 mg by mouth daily.   furosemide 20 MG tablet Commonly known as: LASIX Take 20 mg by mouth every other day.   guaiFENesin 600 MG 12 hr tablet Commonly known as: MUCINEX Take 1 tablet (600 mg total) by mouth 2 (two) times daily.   Linzess 290 MCG Caps capsule Generic drug: linaclotide Take by mouth. 12/21/2018 Takes one every 3 days.   loratadine 10 MG tablet Commonly known as: CLARITIN Take 1 tablet (10 mg total) by mouth daily.   pantoprazole 40 MG tablet Commonly known as: PROTONIX Take 1 tablet by mouth daily.   tamsulosin 0.4 MG Caps capsule Commonly known as: FLOMAX Take 0.4 mg by mouth daily with supper.   temazepam 30 MG capsule Commonly known as: RESTORIL Take 30 mg by mouth at bedtime   venlafaxine XR 150 MG 24 hr capsule Commonly known as: EFFEXOR-XR Take 150 mg by mouth daily.   Vitamin D3 50 MCG (2000 UT) Tabs Take 2,000 Units by mouth 2 (two) times daily.       Allergies:  Allergies  Allergen Reactions  . Sertraline Hcl     Patient does not remember the reaction    Past  Medical History, Surgical history, Social history, and Family History were reviewed and updated.  Review of Systems: All other 10 point review of systems is negative.   Physical Exam:  weight is 176 lb 12.8 oz (80.2 kg). His oral temperature is 97 F (36.1 C) (abnormal). His blood pressure is 113/62 and his pulse is 60. His respiration is 20 and oxygen saturation is 100%.   Wt Readings from Last 3 Encounters:  12/21/18 176 lb 12.8 oz (80.2 kg)  11/24/18 173 lb (78.5 kg)  11/01/18 172 lb (78 kg)    Ocular: Sclerae unicteric, pupils equal, round and reactive to light Ear-nose-throat: Oropharynx clear, dentition fair Lymphatic: No cervical or supraclavicular adenopathy Lungs no rales or rhonchi, good excursion  bilaterally Heart regular rate and rhythm, no murmur appreciated Abd soft, nontender, positive bowel sounds, no liver or spleen tip palpated on exam, no fluid wave  MSK no focal spinal tenderness, no joint edema Neuro: non-focal, well-oriented, appropriate affect Breasts: Deferred   Lab Results  Component Value Date   WBC 6.7 12/21/2018   HGB 12.9 (L) 12/21/2018   HCT 41.7 12/21/2018   MCV 93.9 12/21/2018   PLT 244 12/21/2018   Lab Results  Component Value Date   FERRITIN 92 12/20/2014   IRON 95 12/20/2014   TIBC 405 12/20/2014   UIBC 310 12/20/2014   IRONPCTSAT 23 12/20/2014   Lab Results  Component Value Date   RETICCTPCT 1.8 12/20/2014   RBC 4.44 12/21/2018   Lab Results  Component Value Date   KAPLAMBRATIO 1.98 (L) 12/20/2014   No results found for: Kandis Cocking, IGMSERUM Lab Results  Component Value Date   TOTALPROTELP 6.9 12/20/2014   ALBUMINELP 3.4 12/20/2014   A1GS 0.3 12/20/2014   A2GS 0.9 12/20/2014   BETS 1.3 12/20/2014   GAMS 1.1 12/20/2014   MSPIKE Not Observed 12/20/2014   SPEI Comment 12/20/2014     Chemistry      Component Value Date/Time   NA 141 12/21/2018 1025   NA 142 07/29/2017 1153   NA 144 02/02/2017 0842   NA 140 12/01/2016 0957   K 4.5 12/21/2018 1025   K 3.8 02/02/2017 0842   K 4.5 12/01/2016 0957   CL 105 12/21/2018 1025   CL 105 02/02/2017 0842   CL 104 08/27/2012 1421   CO2 30 12/21/2018 1025   CO2 29 02/02/2017 0842   CO2 24 12/01/2016 0957   BUN 25 (H) 12/21/2018 1025   BUN 22 07/29/2017 1153   BUN 13 02/02/2017 0842   BUN 21.2 12/01/2016 0957   CREATININE 1.01 12/21/2018 1025   CREATININE 1.1 02/02/2017 0842   CREATININE 1.1 12/01/2016 0957      Component Value Date/Time   CALCIUM 9.2 12/21/2018 1025   CALCIUM 9.5 02/02/2017 0842   CALCIUM 10.2 12/01/2016 0957   ALKPHOS 152 (H) 12/21/2018 1025   ALKPHOS 104 (H) 02/02/2017 0842   ALKPHOS 111 12/01/2016 0957   AST 17 12/21/2018 1025   AST 30 12/01/2016 0957    ALT 11 12/21/2018 1025   ALT 28 02/02/2017 0842   ALT 24 12/01/2016 0957   BILITOT 0.6 12/21/2018 1025   BILITOT 0.92 12/01/2016 0957       Impression and Plan: [name] is a very pleasant 84 yo caucasian gentleman with history of both right and left lung cancers as well as melanoma. His most recent malignancy was stage I lung cancer diagnosed in April 2013. He had stereotactic radiosurgery that same year in  July. He had a CT scan in May which showed a 0.7 nodule in the right upper lobe. He is followed closely by pulmonologist Dr. Lamonte Sakai.  We will plan to see him in another 6 months.  He will contact our office with any questions or concerns. We can certainly see him sooner if needed.   Laverna Peace, NP 9/15/202011:35 AM

## 2018-12-21 NOTE — Patient Instructions (Signed)
Tunneled Central Venous Catheter Flushing Guide  It is important to flush your tunneled central venous catheter each time you use it, both before and after you use it. Flushing your catheter will help prevent it from clogging. What are the risks? Risks may include:  Infection.  Air getting into the catheter and bloodstream. Supplies needed:  A clean pair of gloves.  A disinfecting wipe. Use an alcohol wipe, chlorhexidine wipe, or iodine wipe as told by your health care provider.  A 10 mL syringe that has been prefilled with saline solution.  An empty 10 mL syringe, if a substance called heparin was injected into your catheter. How to flush your catheter When you flush your catheter, make sure you follow any specific instructions from your health care provider or the manufacturer. These are general guidelines. Flushing your catheter before use If there is heparin in your catheter: 1. Wash your hands with soap and water. 2. Put on gloves. 3. Scrub the injection cap for a minimum of 15 seconds with a disinfecting wipe. 4. Unclamp the catheter. 5. Attach the empty syringe to the injection cap. 6. Pull the syringe plunger back and withdraw 10 mL of blood. 7. Place the syringe into an appropriate waste container. 8. Scrub the injection cap for 15 seconds with a disinfecting wipe. 9. Attach the prefilled syringe to the injection cap. 10. Flush the catheter by pushing the plunger forward until all the liquid from the syringe is in the catheter. 11. Remove the syringe from the injection cap. 12. Clamp the catheter. If there is no heparin in your catheter: 1. Wash your hands with soap and water. 2. Put on gloves. 3. Scrub the injection cap for 15 seconds with a disinfecting wipe. 4. Unclamp the catheter. 5. Attach the prefilled syringe to the injection cap. 6. Flush the catheter by pushing the plunger forward until 5 mL of the liquid from the syringe is in the catheter. 7. Pull back on  the syringe until you see blood in the catheter. 8. If you have been asked to collect any blood, follow your health care provider's instructions. Otherwise, flush the catheter with the rest of the solution from the syringe. 9. Remove the syringe from the injection cap. 10. Clamp the catheter.  Flushing your catheter after use 1. Wash your hands with soap and water. 2. Put on gloves. 3. Scrub the injection cap for 15 seconds with a disinfecting wipe. 4. Unclamp the catheter. 5. Attach the prefilled syringe to the injection cap. 6. Flush the catheter by pushing the plunger forward until all of the liquid from the syringe is in the catheter. 7. Remove the syringe from the injection cap. 8. Clamp the catheter. Problems and solutions  If blood cannot be completely cleared from the injection cap, you may need to have the injection cap replaced.  If the catheter is difficult to flush, use the pulsing method. The pulsing method involves pushing only a few milliliters of solution into the catheter at a time and pausing between pushes.  If you do not see blood in the catheter when you pull back on the syringe, change your body position, such as by raising your arms above your head. Take a deep breath and cough. Then, pull back on the syringe. If you still do not see blood, flush the catheter with a small amount of solution. Then, change positions again and take a breath or cough. Pull back on the syringe again. If you still do not see   blood, finish flushing the catheter and contact your health care provider. Do not use your catheter until your health care provider says it is okay. General tips  Have someone help you flush your catheter, if possible.  Do not force fluid through your catheter.  Do not use a syringe that is larger or smaller than 10 mL. Using a smaller syringe can make the catheter burst.  Do not use your catheter without flushing it first if it has heparin in it. Contact a health  care provider if:  You cannot see any blood in the catheter when you flush it before using it.  Your catheter is difficult to flush. Get help right away if:  You cannot flush the catheter.  The catheter leaks when you flush it or when there is fluid in it.  There are cracks or breaks in the catheter. Summary  It is important to flush your tunneled central venous catheter each time you use it, both before and after you use it.  Scrub the injection cap for 15 seconds with a disinfecting wipe before and after you flush it.  When you flush your catheter, make sure you follow any specific instructions from your health care provider or the manufacturer.  Get help right away if you cannot flush the catheter. This information is not intended to replace advice given to you by your health care provider. Make sure you discuss any questions you have with your health care provider. Document Released: 03/13/2011 Document Revised: 06/09/2018 Document Reviewed: 06/09/2018 Elsevier Patient Education  2020 Elsevier Inc.  

## 2019-01-04 DIAGNOSIS — I10 Essential (primary) hypertension: Secondary | ICD-10-CM | POA: Diagnosis not present

## 2019-01-04 DIAGNOSIS — I5030 Unspecified diastolic (congestive) heart failure: Secondary | ICD-10-CM | POA: Diagnosis not present

## 2019-01-04 DIAGNOSIS — I252 Old myocardial infarction: Secondary | ICD-10-CM | POA: Diagnosis not present

## 2019-01-04 DIAGNOSIS — E782 Mixed hyperlipidemia: Secondary | ICD-10-CM | POA: Diagnosis not present

## 2019-01-04 DIAGNOSIS — F329 Major depressive disorder, single episode, unspecified: Secondary | ICD-10-CM | POA: Diagnosis not present

## 2019-01-04 DIAGNOSIS — M179 Osteoarthritis of knee, unspecified: Secondary | ICD-10-CM | POA: Diagnosis not present

## 2019-01-04 DIAGNOSIS — J441 Chronic obstructive pulmonary disease with (acute) exacerbation: Secondary | ICD-10-CM | POA: Diagnosis not present

## 2019-01-04 DIAGNOSIS — N400 Enlarged prostate without lower urinary tract symptoms: Secondary | ICD-10-CM | POA: Diagnosis not present

## 2019-01-04 DIAGNOSIS — J449 Chronic obstructive pulmonary disease, unspecified: Secondary | ICD-10-CM | POA: Diagnosis not present

## 2019-01-04 DIAGNOSIS — C3430 Malignant neoplasm of lower lobe, unspecified bronchus or lung: Secondary | ICD-10-CM | POA: Diagnosis not present

## 2019-01-04 DIAGNOSIS — I2510 Atherosclerotic heart disease of native coronary artery without angina pectoris: Secondary | ICD-10-CM | POA: Diagnosis not present

## 2019-01-19 DIAGNOSIS — Z23 Encounter for immunization: Secondary | ICD-10-CM | POA: Diagnosis not present

## 2019-02-02 DIAGNOSIS — C3430 Malignant neoplasm of lower lobe, unspecified bronchus or lung: Secondary | ICD-10-CM | POA: Diagnosis not present

## 2019-02-02 DIAGNOSIS — I5030 Unspecified diastolic (congestive) heart failure: Secondary | ICD-10-CM | POA: Diagnosis not present

## 2019-02-02 DIAGNOSIS — E7800 Pure hypercholesterolemia, unspecified: Secondary | ICD-10-CM | POA: Diagnosis not present

## 2019-02-02 DIAGNOSIS — F329 Major depressive disorder, single episode, unspecified: Secondary | ICD-10-CM | POA: Diagnosis not present

## 2019-02-02 DIAGNOSIS — J441 Chronic obstructive pulmonary disease with (acute) exacerbation: Secondary | ICD-10-CM | POA: Diagnosis not present

## 2019-02-02 DIAGNOSIS — I2510 Atherosclerotic heart disease of native coronary artery without angina pectoris: Secondary | ICD-10-CM | POA: Diagnosis not present

## 2019-02-02 DIAGNOSIS — M179 Osteoarthritis of knee, unspecified: Secondary | ICD-10-CM | POA: Diagnosis not present

## 2019-02-02 DIAGNOSIS — E782 Mixed hyperlipidemia: Secondary | ICD-10-CM | POA: Diagnosis not present

## 2019-02-02 DIAGNOSIS — N400 Enlarged prostate without lower urinary tract symptoms: Secondary | ICD-10-CM | POA: Diagnosis not present

## 2019-02-02 DIAGNOSIS — I10 Essential (primary) hypertension: Secondary | ICD-10-CM | POA: Diagnosis not present

## 2019-02-02 DIAGNOSIS — I252 Old myocardial infarction: Secondary | ICD-10-CM | POA: Diagnosis not present

## 2019-02-02 DIAGNOSIS — J449 Chronic obstructive pulmonary disease, unspecified: Secondary | ICD-10-CM | POA: Diagnosis not present

## 2019-02-17 NOTE — Progress Notes (Signed)
CARDIOLOGY OFFICE NOTE  Date:  02/23/2019    [name] Fields Date of Birth: 05-10-1934 Medical Record #[id number]  PCP:  [name], MD  Cardiologist:  [name]  Chief Complaint  Patient presents with  . Follow-up    History of Present Illness: [name] Fields is a 84 y.o. male who presents today for a 3 month check.  Seen for Dr. Marlou Porch. He is a former patient of Dr. Susa Simmonds that I cared for for many years.I typically see him.  He has a history of coronary artery disease with prior lateral wall MI status post multiple stents in the past. Other issues include lung cancer in both sides (treated with radiation), melanomaof the left cheeck, fibrosis, recurrent pleural effusion. He has had a pericardial window for an effusion back in 2011.   I saw him back in July of 2017- he had been referred back for shortness of breath. Productive cough - COPD - I sent him back to pulmonary for evaluation/treatment - seemed more pulmonary related/radiation effects. Did not understand how to use his Spiriva. Some swelling - getting too much salt. I added some low dose Lasix. He was better on return but has remained very limited by his breathing.  We did a telehealth visit in April - had had a fall. Pretty short of breath. We saw him here in the office back in August - had had several falls - limited by his breathing - lots of urinary frequency and his weight was down. I cut his diuretics back. He was mixed up on his Metoprolol and I stopped this altogether due to low BP.    The patient does not have symptoms concerning for COVID-19 infection (fever, chills, cough, or new shortness of breath).   Comes in today. Here alone. He feels more short of breath. He has not felt good. His medicines are a little unclear. Does not really match up. No chest pain. He has not had follow up CT. Has family support but no one in the home with him. He is using a walker. No real swelling. He  and his brother have been going to [employer] every Thursday to eat in.   Past Medical History:  Diagnosis Date  . Anxiety    takes wellbutrin daily  . Arthritis    knees  . COPD (chronic obstructive pulmonary disease) ([place])   . Diverticulosis   . Emphysema of lung ([place])   . GERD (gastroesophageal reflux disease)   . Hearing loss   . Hx of radiation therapy 09/29/11;10/01/11;10/03/11;10/06/11;10/08/11;   RLLlung,50Gy/75fx - SBRT  . Hyperlipidemia   . Hypertension   . Hypokinesia    MILD INFERIOR  . IHD (ischemic heart disease)    Remote MI in 2001 with stent to the LCX  . Lactose intolerance   . Non-small cell carcinoma of lung ([place])    Dx in October of 2010  . Normal nuclear stress test 2009   EF 63%. No ischemia. 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  . Pericardial effusion July 2011   s/p pericardial window; last echo in August 2012 with minimal effusion  . Pleural effusion, left   . SOB (shortness of breath)    Chronic with known fibrosis  . Status post chemotherapy 06/04/2009 -  09/06/2009    4 cycles of Cisplatin and Gemcitabine with Neulasta Support   . Vertigo    hx. of- ambulates with cane for safety.    Past Surgical History:  Procedure Laterality Date  .  CARDIAC CATHETERIZATION  07/11/2003   EF 60%, REPEAT, WHICH SHOWED 30% PROXIMAL NARROWING IN THE RIGHT  CORONARY ARTERY  . CARDIOVASCULAR STRESS TEST  05/2007   EF 63%  . CATARACT EXTRACTION, BILATERAL Bilateral   . COLONOSCOPY WITH PROPOFOL N/A 01/30/2015   Procedure: COLONOSCOPY WITH PROPOFOL;  Surgeon: Garlan Fair, MD;  Location: [facility];  Service: Endoscopy;  Laterality: N/A;  . CORONARY STENT PLACEMENT  07/1999   STENT TO THE LEFT CIRCUMFLEX  . HERNIA REPAIR     as an adult- R inguinal   . PERICARDIAL WINDOW  July 2011  . TRANSTHORACIC ECHOCARDIOGRAM  02/22/2010   EF 55-60%     Medications: Current Meds  Medication Sig  . aspirin EC 81 MG tablet Take 81 mg by mouth daily.  Marland Kitchen atorvastatin (LIPITOR) 20 MG tablet  Take 20 mg by mouth daily.  Marland Kitchen buPROPion (WELLBUTRIN SR) 150 MG 12 hr tablet Take 150 mg by mouth daily.   . Cholecalciferol (VITAMIN D3) 2000 units TABS Take 2,000 Units by mouth 2 (two) times daily.   . finasteride (PROSCAR) 5 MG tablet Take 5 mg by mouth daily.  . furosemide (LASIX) 20 MG tablet Take 20 mg by mouth every other day.   . linaclotide (LINZESS) 290 MCG CAPS capsule Take by mouth. 12/21/2018 Takes one every 3 days.  Marland Kitchen loratadine (CLARITIN) 10 MG tablet Take 1 tablet (10 mg total) by mouth daily.  . pantoprazole (PROTONIX) 40 MG tablet Take 1 tablet by mouth daily.  . Tamsulosin HCl (FLOMAX) 0.4 MG CAPS Take 0.4 mg by mouth daily with supper.   . temazepam (RESTORIL) 30 MG capsule Take 30 mg by mouth at bedtime  . venlafaxine XR (EFFEXOR-XR) 150 MG 24 hr capsule Take 150 mg by mouth daily.      Allergies: Allergies  Allergen Reactions  . Sertraline Hcl     Patient does not remember the reaction    Social History: The patient  reports that he quit smoking about 24 years ago. His smoking use included cigarettes. He has a 120.00 pack-year smoking history. He has never used smokeless tobacco. He reports that he does not drink alcohol or use drugs.   Family History: The patient's family history includes Lung cancer in his father; Stroke in his sister.   Review of Systems: Please see the history of present illness.   All other systems are reviewed and negative.   Physical Exam: VS:  BP 136/70   Pulse 78   Ht 5\' 10"  (1.778 m)   Wt 176 lb 1.9 oz (79.9 kg)   SpO2 94%   BMI 25.27 kg/m  .  BMI Body mass index is 25.27 kg/m.  Wt Readings from Last 3 Encounters:  02/23/19 176 lb 1.9 oz (79.9 kg)  12/21/18 176 lb 12.8 oz (80.2 kg)  11/24/18 173 lb (78.5 kg)    General: Pleasant. Alert but chronically ill appearing. He is short of breath with walking in. He does not appear to be in no acute distress.   HEENT: Normal.  Neck: Supple, no JVD, carotid bruits, or masses  noted.  Cardiac: Regular rate and rhythm. He has a soft S4 noted. No edema.  Respiratory:  Very decreased breath sounds and with increased work of breathing.  GI: Soft and nontender.  MS: No deformity or atrophy. Gait and ROM intact. Using a walker.  Skin: Warm and dry. Color is normal.  Neuro:  Strength and sensation are intact and no gross focal deficits  noted.  Psych: Alert, appropriate and with normal affect.   LABORATORY DATA:  EKG:  EKG is ordered today. This demonstrates NSR - 1st degree AV block - noted Q's inferiorly and anteriorly with poor R wave progression - unchanged. HR is 78.   Lab Results  Component Value Date   WBC 6.7 12/21/2018   HGB 12.9 (L) 12/21/2018   HCT 41.7 12/21/2018   PLT 244 12/21/2018   GLUCOSE 98 12/21/2018   CHOL 147 06/24/2016   TRIG 158 (H) 06/24/2016   HDL 70 06/24/2016   LDLCALC 45 06/24/2016   ALT 11 12/21/2018   AST 17 12/21/2018   NA 141 12/21/2018   K 4.5 12/21/2018   CL 105 12/21/2018   CREATININE 1.01 12/21/2018   BUN 25 (H) 12/21/2018   CO2 30 12/21/2018   TSH 5.020 (H) 06/24/2016   INR 1.17 12/20/2014   HGBA1C 5.6 12/20/2014       BNP (last 3 results) No results for input(s): BNP in the last 8760 hours.  ProBNP (last 3 results) No results for input(s): PROBNP in the last 8760 hours.   Other Studies Reviewed Today:   CT CHEST IMPRESSION 08/2018: 1. Mild growth of one of the right upper lobe solid pulmonary nodules, now 0.7 cm, cannot exclude enlarging pulmonary metastasis. This nodule remains below PET resolution and follow-up chest CT is recommended in 3 months. 2. No additional potential sites of metastatic disease in the chest. Additional previously visualized peripheral basilar right upper lobe pulmonary nodules have significantly decreased. 3. Stable masslike fibrosis in the paramediastinal upper left lung and central right lower lobe. Stable chronic left pleural thickening and loculated small basilar left  pleural effusion.  Aortic Atherosclerosis (ICD10-I70.0) and Emphysema (ICD10-J43.9).  EchoStudy Conclusions4/2019  - Left ventricle: The cavity size was normal. Wall thickness was increased in a pattern of mild LVH. Systolic function was normal. The estimated ejection fraction was in the range of 55% to 60%. Although no diagnostic regional wall motion abnormality was identified, this possibility cannot be completely excluded on the basis of this study. Features are consistent with a pseudonormal left ventricular filling pattern, with concomitant abnormal relaxation and increased filling pressure (grade 2 diastolic dysfunction). - Aortic valve: Noncoronary cusp mobility was moderately restricted. There was mild regurgitation. Valve area (VTI): 2.61 cm^2. - Mitral valve: There was moderate regurgitation directed centrally. Diastolic regurgitation was present. - Left atrium: The atrium was moderately dilated. - Right ventricle: The cavity size was mildly dilated. Systolic function was mildly reduced. - Right atrium: The atrium was mildly dilated. - Tricuspid valve: There was mild regurgitation. Diastolic regurgitation was present. - Pulmonary arteries: Systolic pressure was mildly increased. PA peak pressure: 39 mm Hg (S). - Pericardium, extracardiac: A trivial pericardial effusion was identified.   Myoview Study Highlights from 04/2015   Nuclear stress EF: 56%.  The study is normal.  This is a low risk study.  The left ventricular ejection fraction is normal (55-65%).  Normal resting and stress perfusion EF 56%      ASSESSMENT & PLAN:   1. Chronic shortness of breath - most likely multifactorial and this remains his primary issue - has had prior squamous cell lung cancer, radiation with fibrosis and underlying emphysema - last CT noted and with possible growth of one nodule - has not had his follow up scan or pulmonary visit  - we will arrange the CT. His breathing seems to be worsening. Not on oxygen at this time but seems to  be nearing that stage.   2. CAD - remote MI with multiple stents - no active chest pain - chronically abnormal EKG - would favor conservative management. He is off Metoprolol. HR is ok.  3. 1st degree AV block   4. Multiple falls - balance is poor - high risk for injury.   5. Prior pericardial effusion with prior window  6. HLD - on statin  7. Melanoma   8. Diastolic dysfunction - using Lasix for swelling - BP is ok.   9. COVID-19 Education: The signs and symptoms of COVID-19 were discussed with the patient and how to seek care for testing (follow up with PCP or arrange E-visit).  The importance of social distancing, staying at home, hand hygiene and wearing a mask when out in public were discussed today.  Current medicines are reviewed with the patient today.  The patient does not have concerns regarding medicines other than what has been noted above.  The following changes have been made:  See above.  Labs/ tests ordered today include:    Orders Placed This Encounter  Procedures  . CT Chest Wo Contrast  . Basic metabolic panel  . CBC no Diff  . EKG 12-Lead     Disposition:   FU with me in 6 months.   Patient is agreeable to this plan and will call if any problems develop in the interim.   SignedTruitt Merle, NP  02/23/2019 12:09 PM  [facility] [address] [place], [place]  [address] Phone: [phone] Fax: [phone]

## 2019-02-23 ENCOUNTER — Other Ambulatory Visit: Payer: Self-pay

## 2019-02-23 ENCOUNTER — Encounter: Payer: Self-pay | Admitting: Nurse Practitioner

## 2019-02-23 ENCOUNTER — Ambulatory Visit (INDEPENDENT_AMBULATORY_CARE_PROVIDER_SITE_OTHER): Payer: Medicare Other | Admitting: Nurse Practitioner

## 2019-02-23 ENCOUNTER — Encounter (INDEPENDENT_AMBULATORY_CARE_PROVIDER_SITE_OTHER): Payer: Self-pay

## 2019-02-23 VITALS — BP 136/70 | HR 78 | Ht 70.0 in | Wt 176.1 lb

## 2019-02-23 DIAGNOSIS — Z85118 Personal history of other malignant neoplasm of bronchus and lung: Secondary | ICD-10-CM

## 2019-02-23 DIAGNOSIS — R0602 Shortness of breath: Secondary | ICD-10-CM | POA: Diagnosis not present

## 2019-02-23 DIAGNOSIS — I259 Chronic ischemic heart disease, unspecified: Secondary | ICD-10-CM | POA: Diagnosis not present

## 2019-02-23 DIAGNOSIS — Z7189 Other specified counseling: Secondary | ICD-10-CM | POA: Diagnosis not present

## 2019-02-23 DIAGNOSIS — J438 Other emphysema: Secondary | ICD-10-CM | POA: Diagnosis not present

## 2019-02-23 NOTE — Patient Instructions (Addendum)
After Visit Summary:  We will be checking the following labs today - BMET and CBC   Medication Instructions:    Continue with your current medicines.    If you need a refill on your cardiac medications before your next appointment, please call your pharmacy.     Testing/Procedures To Be Arranged:  Follow up CT of the chest without contrast  Follow-Up:   See me in 4 months.  Try to make an appointment with Dr. Lamonte Sakai - I will also send him a message.      At [facility], you and your health needs are our priority.  As part of our continuing mission to provide you with exceptional heart care, we have created designated Provider Care Teams.  These Care Teams include your primary Cardiologist (physician) and Advanced Practice Providers (APPs -  Physician Assistants and Nurse Practitioners) who all work together to provide you with the care you need, when you need it.  Special Instructions:  . Stay safe, stay home, wash your hands for at least 20 seconds and wear a mask when out in public.  . It was good to talk with you today.  . Try to stay OUT of the restaurants for now.    Call the [place] office at [phone] if you have any questions, problems or concerns.

## 2019-02-24 ENCOUNTER — Telehealth: Payer: Self-pay | Admitting: Nurse Practitioner

## 2019-02-24 LAB — BASIC METABOLIC PANEL
BUN/Creatinine Ratio: 15 (ref 10–24)
BUN: 16 mg/dL (ref 8–27)
CO2: 28 mmol/L (ref 20–29)
Calcium: 9.4 mg/dL (ref 8.6–10.2)
Chloride: 101 mmol/L (ref 96–106)
Creatinine, Ser: 1.08 mg/dL (ref 0.76–1.27)
GFR calc Af Amer: 74 mL/min/{1.73_m2} (ref >59)
GFR calc non Af Amer: 64 mL/min/{1.73_m2} (ref >59)
Glucose: 76 mg/dL (ref 65–99)
Potassium: 4.6 mmol/L (ref 3.5–5.2)
Sodium: 143 mmol/L (ref 134–144)

## 2019-02-24 LAB — CBC
Hematocrit: 42.6 % (ref 37.5–51.0)
Hemoglobin: 13.8 g/dL (ref 13.0–17.7)
MCH: 29.4 pg (ref 26.6–33.0)
MCHC: 32.4 g/dL (ref 31.5–35.7)
MCV: 91 fL (ref 79–97)
Platelets: 285 10*3/uL (ref 150–450)
RBC: 4.7 x10E6/uL (ref 4.14–5.80)
RDW: 13.3 % (ref 11.6–15.4)
WBC: 8.3 10*3/uL (ref 3.4–10.8)

## 2019-02-24 NOTE — Telephone Encounter (Signed)
New message    Please mail AVS to patient address on file

## 2019-02-24 NOTE — Telephone Encounter (Signed)
Sent a message to Gilmer Mor and will mail pt's AVS.

## 2019-03-02 DIAGNOSIS — Z85118 Personal history of other malignant neoplasm of bronchus and lung: Secondary | ICD-10-CM | POA: Diagnosis not present

## 2019-03-02 DIAGNOSIS — F322 Major depressive disorder, single episode, severe without psychotic features: Secondary | ICD-10-CM | POA: Diagnosis not present

## 2019-03-02 DIAGNOSIS — I252 Old myocardial infarction: Secondary | ICD-10-CM | POA: Diagnosis not present

## 2019-03-02 DIAGNOSIS — I10 Essential (primary) hypertension: Secondary | ICD-10-CM | POA: Diagnosis not present

## 2019-03-02 DIAGNOSIS — F329 Major depressive disorder, single episode, unspecified: Secondary | ICD-10-CM | POA: Diagnosis not present

## 2019-03-02 DIAGNOSIS — E782 Mixed hyperlipidemia: Secondary | ICD-10-CM | POA: Diagnosis not present

## 2019-03-02 DIAGNOSIS — I2510 Atherosclerotic heart disease of native coronary artery without angina pectoris: Secondary | ICD-10-CM | POA: Diagnosis not present

## 2019-03-02 DIAGNOSIS — J441 Chronic obstructive pulmonary disease with (acute) exacerbation: Secondary | ICD-10-CM | POA: Diagnosis not present

## 2019-03-02 DIAGNOSIS — J449 Chronic obstructive pulmonary disease, unspecified: Secondary | ICD-10-CM | POA: Diagnosis not present

## 2019-03-02 DIAGNOSIS — N400 Enlarged prostate without lower urinary tract symptoms: Secondary | ICD-10-CM | POA: Diagnosis not present

## 2019-03-02 DIAGNOSIS — I5030 Unspecified diastolic (congestive) heart failure: Secondary | ICD-10-CM | POA: Diagnosis not present

## 2019-03-08 ENCOUNTER — Ambulatory Visit
Admission: RE | Admit: 2019-03-08 | Discharge: 2019-03-08 | Disposition: A | Discharge status: Home / Self Care | Payer: Medicare Other | Source: Ambulatory Visit | Attending: Nurse Practitioner | Admitting: Nurse Practitioner

## 2019-03-08 DIAGNOSIS — J438 Other emphysema: Secondary | ICD-10-CM

## 2019-03-08 DIAGNOSIS — I259 Chronic ischemic heart disease, unspecified: Secondary | ICD-10-CM

## 2019-03-08 DIAGNOSIS — Z85118 Personal history of other malignant neoplasm of bronchus and lung: Secondary | ICD-10-CM

## 2019-03-08 DIAGNOSIS — C3490 Malignant neoplasm of unspecified part of unspecified bronchus or lung: Secondary | ICD-10-CM | POA: Diagnosis not present

## 2019-04-05 ENCOUNTER — Encounter: Payer: Self-pay | Admitting: Emergency Medicine

## 2019-04-05 ENCOUNTER — Ambulatory Visit (INDEPENDENT_AMBULATORY_CARE_PROVIDER_SITE_OTHER): Payer: Medicare Other | Admitting: Emergency Medicine

## 2019-04-05 ENCOUNTER — Other Ambulatory Visit: Payer: Self-pay

## 2019-04-05 VITALS — BP 118/60 | HR 85 | Temp 97.2°F | Ht 70.0 in | Wt 173.6 lb

## 2019-04-05 DIAGNOSIS — R061 Stridor: Secondary | ICD-10-CM

## 2019-04-05 DIAGNOSIS — I259 Chronic ischemic heart disease, unspecified: Secondary | ICD-10-CM

## 2019-04-05 DIAGNOSIS — J9611 Chronic respiratory failure with hypoxia: Secondary | ICD-10-CM | POA: Diagnosis not present

## 2019-04-05 DIAGNOSIS — R911 Solitary pulmonary nodule: Secondary | ICD-10-CM | POA: Diagnosis not present

## 2019-04-05 MED ORDER — ALBUTEROL SULFATE HFA 108 (90 BASE) MCG/ACT IN AERS: 2.0000 | INHALATION_SPRAY | Freq: Four times a day (QID) | RESPIRATORY_TRACT | 3 refills | Status: AC | PRN

## 2019-04-05 NOTE — Addendum Note (Signed)
Addended by: Len Blalock on: 04/05/2019 04:07 PM   Modules accepted: Orders

## 2019-04-05 NOTE — Progress Notes (Signed)
   Subjective:    Patient ID: [name] Fields, male    DOB: 07/02/1934, 84 y.o.   MRN: [id number]  HPI  ROV 11/01/2018 --84 year old gentleman with a history of prior heavy tobacco and alpha-1 antitrypsin heterozygosity (MS) with associated severe COPD.  Also with non-small cell lung cancer of the right lower lobe and left lung.  Course complicated by pleural and pericardial effusions.  History is otherwise significant for coronary disease with PTCI.   Today he reports that he has good days and bad. His cough is probably less, still can produce some green phlegm. He does still have some exertional SOB. No wheeze. He can walk about 100 ft with a walker, then stops for a rest.  He notes that he loses balance when he stands quickly.  Has some UA noise. Hasn't been on loratadine, Astelin, mucinex. He uses ProAir - he is unsure whether he is on Anoro reliably. Says he takers a "red one".   ROV 04/05/2019 --[name] has a significant prior tobacco history, alpha 1 antitrypsin heterozygosity (MS) with severe COPD.  Also with a history of non-small cell lung cancer affecting both lower lobes, most recent therapy was stereotactic XRT to stage I right lower lobe nodule.  He has serial CT scans, most recent 03/09/2019 which I reviewed.  This shows posttreatment pleural thickening, emphysematous change, slight decrease in his right sided perihilar soft tissue.  Some new posterior right upper lobe peripheral groundglass opacity 11 mm.  A right upper lobe nodule stable in size 0.6 cm. He is on Anoro, is out of albuterol.   Review of Systems As per HPI      Objective:   Physical Exam  Vitals:   04/05/19 1435  BP: 118/60  Pulse: 85  Temp: (!) 97.2 F (36.2 C)  TempSrc: Temporal  SpO2: 92%  Weight: 173 lb 9.6 oz (78.7 kg)  Height: 5\' 10"  (1.778 m)   Gen: Pleasant, elderly gentleman, in no distress,  normal affect  ENT: No lesions,  mouth clear,  oropharynx clear, no postnasal drip, hearing aids  in place, strong voice  Neck: No JVD, insp and exp stridor present  Lungs: No use of accessory muscles, referred UA noise, no overt wheeze  Cardiovascular: RRR, heart sounds normal, no murmur or gallops, no peripheral edema  Musculoskeletal: No deformities, no cyanosis or clubbing  Neuro: alert, non focal  Skin: Warm, no lesions or rash     Assessment & Plan:  COPD (chronic obstructive pulmonary disease) with emphysema (HCC) Please continue to take your Anoro once a day every day.  This is your maintenance inhaler medication. Please keep albuterol (ProAir) available to use 2 puffs up to every 4 hours if you need it for shortness of breath, chest tightness, wheezing.  We will write a new prescription for this today.  Keep it available to use if you experience symptoms. Walking oximetry today on room air Follow with Dr Lamonte Sakai in 6 months or sooner if you have any problems  Pulmonary nodule New 11 mm groundglass focus, will need to be followed but no clear evidence for recurrence of his malignancy.  Next CT scan to be done in 6 months, May 2021.  Stridor Significant UA noise. Do not hear any overt wheeze today.   Baltazar Apo, MD, PhD 04/05/2019, 2:55 PM [employer] [phone] or if no answer [phone]

## 2019-04-05 NOTE — Assessment & Plan Note (Signed)
Please continue to take your Anoro once a day every day.  This is your maintenance inhaler medication. Please keep albuterol (ProAir) available to use 2 puffs up to every 4 hours if you need it for shortness of breath, chest tightness, wheezing.  We will write a new prescription for this today.  Keep it available to use if you experience symptoms. Walking oximetry today on room air Follow with Dr Lamonte Sakai in 6 months or sooner if you have any problems

## 2019-04-05 NOTE — Assessment & Plan Note (Signed)
Significant UA noise. Do not hear any overt wheeze today.

## 2019-04-05 NOTE — Assessment & Plan Note (Signed)
New 11 mm groundglass focus, will need to be followed but no clear evidence for recurrence of his malignancy.  Next CT scan to be done in 6 months, May 2021.

## 2019-04-05 NOTE — Patient Instructions (Signed)
Please continue to take your Anoro once a day every day.  This is your maintenance inhaler medication. Please keep albuterol (ProAir) available to use 2 puffs up to every 4 hours if you need it for shortness of breath, chest tightness, wheezing.  We will write a new prescription for this today.  Keep it available to use if you experience symptoms. Walking oximetry today on room air We will plan to repeat your CT scan of the chest in May 2021 to follow your pulmonary nodule disease. Follow with Dr Lamonte Sakai in 6 months or sooner if you have any problems

## 2019-04-07 DIAGNOSIS — F329 Major depressive disorder, single episode, unspecified: Secondary | ICD-10-CM | POA: Diagnosis not present

## 2019-04-07 DIAGNOSIS — I2510 Atherosclerotic heart disease of native coronary artery without angina pectoris: Secondary | ICD-10-CM | POA: Diagnosis not present

## 2019-04-07 DIAGNOSIS — Z85118 Personal history of other malignant neoplasm of bronchus and lung: Secondary | ICD-10-CM | POA: Diagnosis not present

## 2019-04-07 DIAGNOSIS — N401 Enlarged prostate with lower urinary tract symptoms: Secondary | ICD-10-CM | POA: Diagnosis not present

## 2019-04-07 DIAGNOSIS — C3430 Malignant neoplasm of lower lobe, unspecified bronchus or lung: Secondary | ICD-10-CM | POA: Diagnosis not present

## 2019-04-07 DIAGNOSIS — I10 Essential (primary) hypertension: Secondary | ICD-10-CM | POA: Diagnosis not present

## 2019-04-07 DIAGNOSIS — E782 Mixed hyperlipidemia: Secondary | ICD-10-CM | POA: Diagnosis not present

## 2019-04-07 DIAGNOSIS — J441 Chronic obstructive pulmonary disease with (acute) exacerbation: Secondary | ICD-10-CM | POA: Diagnosis not present

## 2019-04-07 DIAGNOSIS — J449 Chronic obstructive pulmonary disease, unspecified: Secondary | ICD-10-CM | POA: Diagnosis not present

## 2019-04-07 DIAGNOSIS — I252 Old myocardial infarction: Secondary | ICD-10-CM | POA: Diagnosis not present

## 2019-04-07 DIAGNOSIS — I5030 Unspecified diastolic (congestive) heart failure: Secondary | ICD-10-CM | POA: Diagnosis not present

## 2019-04-07 DIAGNOSIS — F322 Major depressive disorder, single episode, severe without psychotic features: Secondary | ICD-10-CM | POA: Diagnosis not present

## 2019-04-11 ENCOUNTER — Telehealth: Payer: Self-pay | Admitting: Emergency Medicine

## 2019-04-11 NOTE — Telephone Encounter (Signed)
Pt was seen by RB 12/29 and an order for O2 was placed at that visit.  Called pt but unable to reach. Left message for pt to return call.

## 2019-04-12 DIAGNOSIS — E7800 Pure hypercholesterolemia, unspecified: Secondary | ICD-10-CM | POA: Diagnosis not present

## 2019-04-12 DIAGNOSIS — C3430 Malignant neoplasm of lower lobe, unspecified bronchus or lung: Secondary | ICD-10-CM | POA: Diagnosis not present

## 2019-04-12 DIAGNOSIS — F322 Major depressive disorder, single episode, severe without psychotic features: Secondary | ICD-10-CM | POA: Diagnosis not present

## 2019-04-12 DIAGNOSIS — I2510 Atherosclerotic heart disease of native coronary artery without angina pectoris: Secondary | ICD-10-CM | POA: Diagnosis not present

## 2019-04-12 DIAGNOSIS — M179 Osteoarthritis of knee, unspecified: Secondary | ICD-10-CM | POA: Diagnosis not present

## 2019-04-12 DIAGNOSIS — J441 Chronic obstructive pulmonary disease with (acute) exacerbation: Secondary | ICD-10-CM | POA: Diagnosis not present

## 2019-04-12 DIAGNOSIS — I10 Essential (primary) hypertension: Secondary | ICD-10-CM | POA: Diagnosis not present

## 2019-04-12 DIAGNOSIS — I5030 Unspecified diastolic (congestive) heart failure: Secondary | ICD-10-CM | POA: Diagnosis not present

## 2019-04-12 DIAGNOSIS — K5900 Constipation, unspecified: Secondary | ICD-10-CM | POA: Diagnosis not present

## 2019-04-12 DIAGNOSIS — Z85118 Personal history of other malignant neoplasm of bronchus and lung: Secondary | ICD-10-CM | POA: Diagnosis not present

## 2019-04-12 DIAGNOSIS — N400 Enlarged prostate without lower urinary tract symptoms: Secondary | ICD-10-CM | POA: Diagnosis not present

## 2019-04-12 NOTE — Telephone Encounter (Signed)
Spoke with pt, he state she did receive his oxygen today and nothing further is needed.

## 2019-05-05 ENCOUNTER — Telehealth: Payer: Self-pay | Admitting: Emergency Medicine

## 2019-05-05 DIAGNOSIS — I10 Essential (primary) hypertension: Secondary | ICD-10-CM | POA: Diagnosis not present

## 2019-05-05 DIAGNOSIS — I252 Old myocardial infarction: Secondary | ICD-10-CM | POA: Diagnosis not present

## 2019-05-05 DIAGNOSIS — I2510 Atherosclerotic heart disease of native coronary artery without angina pectoris: Secondary | ICD-10-CM | POA: Diagnosis not present

## 2019-05-05 DIAGNOSIS — Z85118 Personal history of other malignant neoplasm of bronchus and lung: Secondary | ICD-10-CM | POA: Diagnosis not present

## 2019-05-05 DIAGNOSIS — N401 Enlarged prostate with lower urinary tract symptoms: Secondary | ICD-10-CM | POA: Diagnosis not present

## 2019-05-05 DIAGNOSIS — N400 Enlarged prostate without lower urinary tract symptoms: Secondary | ICD-10-CM | POA: Diagnosis not present

## 2019-05-05 DIAGNOSIS — F322 Major depressive disorder, single episode, severe without psychotic features: Secondary | ICD-10-CM | POA: Diagnosis not present

## 2019-05-05 DIAGNOSIS — E782 Mixed hyperlipidemia: Secondary | ICD-10-CM | POA: Diagnosis not present

## 2019-05-05 DIAGNOSIS — J449 Chronic obstructive pulmonary disease, unspecified: Secondary | ICD-10-CM | POA: Diagnosis not present

## 2019-05-05 DIAGNOSIS — J441 Chronic obstructive pulmonary disease with (acute) exacerbation: Secondary | ICD-10-CM | POA: Diagnosis not present

## 2019-05-05 DIAGNOSIS — F329 Major depressive disorder, single episode, unspecified: Secondary | ICD-10-CM | POA: Diagnosis not present

## 2019-05-05 NOTE — Telephone Encounter (Signed)
Called and spoke with Joellen Jersey, with Dr. Inda Merlin. They were wanting to know whish DME company received Patients O2 order. DME order was sent to [employer]. Nothing further at this time.

## 2019-05-24 ENCOUNTER — Telehealth: Payer: Self-pay | Admitting: Emergency Medicine

## 2019-05-24 NOTE — Telephone Encounter (Signed)
Spoke with pt's sister, Ellin Goodie. Per RB's documentation, he is to have a repeat CT scan in May 2021. Reva would like to go ahead and schedule this.  [facility] - please advise. Thanks.

## 2019-05-24 NOTE — Telephone Encounter (Signed)
I will work on this one.

## 2019-05-24 NOTE — Telephone Encounter (Signed)
Sched for 5/4 @ 11, check in by 10:40, no prep.  [place] [place], [address].  Gave appt info to pts sister Reva. Nothing further needed at this time.

## 2019-06-01 DIAGNOSIS — E782 Mixed hyperlipidemia: Secondary | ICD-10-CM | POA: Diagnosis not present

## 2019-06-01 DIAGNOSIS — Z85118 Personal history of other malignant neoplasm of bronchus and lung: Secondary | ICD-10-CM | POA: Diagnosis not present

## 2019-06-01 DIAGNOSIS — J449 Chronic obstructive pulmonary disease, unspecified: Secondary | ICD-10-CM | POA: Diagnosis not present

## 2019-06-01 DIAGNOSIS — J441 Chronic obstructive pulmonary disease with (acute) exacerbation: Secondary | ICD-10-CM | POA: Diagnosis not present

## 2019-06-01 DIAGNOSIS — I5030 Unspecified diastolic (congestive) heart failure: Secondary | ICD-10-CM | POA: Diagnosis not present

## 2019-06-01 DIAGNOSIS — N401 Enlarged prostate with lower urinary tract symptoms: Secondary | ICD-10-CM | POA: Diagnosis not present

## 2019-06-01 DIAGNOSIS — F322 Major depressive disorder, single episode, severe without psychotic features: Secondary | ICD-10-CM | POA: Diagnosis not present

## 2019-06-01 DIAGNOSIS — E7800 Pure hypercholesterolemia, unspecified: Secondary | ICD-10-CM | POA: Diagnosis not present

## 2019-06-01 DIAGNOSIS — I2510 Atherosclerotic heart disease of native coronary artery without angina pectoris: Secondary | ICD-10-CM | POA: Diagnosis not present

## 2019-06-01 DIAGNOSIS — I10 Essential (primary) hypertension: Secondary | ICD-10-CM | POA: Diagnosis not present

## 2019-06-08 ENCOUNTER — Telehealth: Payer: Self-pay | Admitting: Hematology & Oncology

## 2019-06-08 NOTE — Telephone Encounter (Signed)
Called advised patient of appointment date/time changes due to DR Ennever's schedule.  Calendar letter was mailed

## 2019-06-14 NOTE — Progress Notes (Addendum)
CARDIOLOGY OFFICE NOTE  Date:  06/22/2019    [name] Date of Birth: 1934/08/29 Medical Record #[id number]  PCP:  [name], MD  Cardiologist:  [name]  Chief Complaint  Patient presents with  . Follow-up    Seen for Dr. Marlou Porch    History of Present Illness: [name] Fields is a 85 y.o. male who presents today for a follow up visit. Seen for Dr. Marlou Porch. He is a former patient of Dr. Susa Simmonds that I cared for for many years.I typically see him.  He has a history of coronary artery disease with prior lateral wall MI status post multiple stents in the past. Other issues include lung cancer in both sides (treated with radiation), melanomaof the left cheeck, fibrosis, recurrent pleural effusion. He has had a pericardial window for an effusion back in 2011.   I saw him back in July of 2017- he had been referred back for shortness of breath. Productive cough - COPD - I sent him back to pulmonary for evaluation/treatment - seemed more pulmonary related/radiation effects. Did not understand how to use his Spiriva. Some swelling - getting too much salt. I added some low dose Lasix. He was better on return but has remained very limited by his breathing.  We did a telehealth visit in April - had had a fall. Pretty short of breath. We saw him here in the office back in August - had had several falls - limited by his breathing - lots of urinary frequency and his weight was down. I cut his diuretics back. He was mixed up on his Metoprolol and I stopped this altogether due to low BP.  Last seen back in November - his medicines did not match up - had not had his follow up CT - which I arranged.   The patient does not have symptoms concerning for COVID-19 infection (fever, chills, cough, or new shortness of breath).   Comes in today. Here alone. He has had both COVID vaccines. He is "wobbly". He was actually out at [facility] yesterday - that was hard for him. He  had to go there for his hearing aids. He is not sure this has helped. No chest pain. Just "can't get his breath". He notes he saw Dr. Lamonte Sakai right after our last CT scan - he has been put on oxygen - only using when at home - he feels this has helped - he has follow up scan in May. To see Oncology tomorrow with lab too.   Past Medical History:  Diagnosis Date  . Anxiety    takes wellbutrin daily  . Arthritis    knees  . COPD (chronic obstructive pulmonary disease) ([place])   . Diverticulosis   . Emphysema of lung ([place])   . GERD (gastroesophageal reflux disease)   . Hearing loss   . Hx of radiation therapy 09/29/11;10/01/11;10/03/11;10/06/11;10/08/11;   RLLlung,50Gy/2fx - SBRT  . Hyperlipidemia   . Hypertension   . Hypokinesia    MILD INFERIOR  . IHD (ischemic heart disease)    Remote MI in 2001 with stent to the LCX  . Lactose intolerance   . Non-small cell carcinoma of lung ([place])    Dx in October of 2010  . Normal nuclear stress test 2009   EF 63%. No ischemia. 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  . Pericardial effusion July 2011   s/p pericardial window; last echo in August 2012 with minimal effusion  . Pleural effusion, left   .  SOB (shortness of breath)    Chronic with known fibrosis  . Status post chemotherapy 06/04/2009 -  09/06/2009    4 cycles of Cisplatin and Gemcitabine with Neulasta Support   . Vertigo    hx. of- ambulates with cane for safety.    Past Surgical History:  Procedure Laterality Date  . CARDIAC CATHETERIZATION  07/11/2003   EF 60%, REPEAT, WHICH SHOWED 30% PROXIMAL NARROWING IN THE RIGHT  CORONARY ARTERY  . CARDIOVASCULAR STRESS TEST  05/2007   EF 63%  . CATARACT EXTRACTION, BILATERAL Bilateral   . COLONOSCOPY WITH PROPOFOL N/A 01/30/2015   Procedure: COLONOSCOPY WITH PROPOFOL;  Surgeon: Garlan Fair, MD;  Location: [facility] ENDOSCOPY;  Service: Endoscopy;  Laterality: N/A;  . CORONARY STENT PLACEMENT  07/1999   STENT TO THE LEFT CIRCUMFLEX  . HERNIA REPAIR     as an adult-  R inguinal   . PERICARDIAL WINDOW  July 2011  . TRANSTHORACIC ECHOCARDIOGRAM  02/22/2010   EF 55-60%     Medications: Current Meds  Medication Sig  . albuterol (PROAIR HFA) 108 (90 Base) MCG/ACT inhaler Inhale 2 puffs into the lungs every 6 (six) hours as needed for wheezing or shortness of breath.  Marland Kitchen aspirin EC 81 MG tablet Take 81 mg by mouth daily.  Marland Kitchen atorvastatin (LIPITOR) 20 MG tablet Take 20 mg by mouth daily.  Marland Kitchen buPROPion (WELLBUTRIN SR) 150 MG 12 hr tablet Take 150 mg by mouth daily.   . Cholecalciferol (VITAMIN D3) 2000 units TABS Take 2,000 Units by mouth 2 (two) times daily.   . finasteride (PROSCAR) 5 MG tablet Take 5 mg by mouth daily.  Marland Kitchen loratadine (CLARITIN) 10 MG tablet Take 1 tablet (10 mg total) by mouth daily.  . Menthol, Topical Analgesic, (BIOFREEZE EX) Apply topically as directed.  . pantoprazole (PROTONIX) 40 MG tablet Take 1 tablet by mouth daily.  . Tamsulosin HCl (FLOMAX) 0.4 MG CAPS Take 0.4 mg by mouth daily with supper.   . temazepam (RESTORIL) 30 MG capsule Take 30 mg by mouth at bedtime  . venlafaxine XR (EFFEXOR-XR) 150 MG 24 hr capsule Take 150 mg by mouth daily.      Allergies: Allergies  Allergen Reactions  . Sertraline Hcl     Patient does not remember the reaction    Social History: The patient  reports that he quit smoking about 25 years ago. His smoking use included cigarettes. He has a 120.00 pack-year smoking history. He has never used smokeless tobacco. He reports that he does not drink alcohol or use drugs.   Family History: The patient's family history includes Lung cancer in his father; Stroke in his sister.   Review of Systems: Please see the history of present illness.   All other systems are reviewed and negative.   Physical Exam: VS:  BP 134/70   Pulse 75   Ht 5\' 10"  (1.778 m)   Wt 180 lb 12.8 oz (82 kg)   SpO2 93%   BMI 25.94 kg/m  .  BMI Body mass index is 25.94 kg/m.  Wt Readings from Last 3 Encounters:  06/22/19  180 lb 12.8 oz (82 kg)  04/05/19 173 lb 9.6 oz (78.7 kg)  02/23/19 176 lb 1.9 oz (79.9 kg)   He weighed with a heavy coat on when he weighed today.   General: Alert and in no acute distress. He is very hard of hearing.  HEENT: Normal.  Neck: Supple, no JVD, carotid bruits, or masses noted.  Cardiac: Regular rate and rhythm.Soft outflow murmur. No edema.  Respiratory:  Lungs are clear to auscultation bilaterally with normal work of breathing.  GI: Soft and nontender.  MS: No deformity or atrophy. Gait and ROM intact.  Skin: Warm and dry. Color is normal.  Neuro:  Strength and sensation are intact and no gross focal deficits noted.  Psych: Alert, appropriate and with normal affect.   LABORATORY DATA:  EKG:  EKG is not ordered today.   Lab Results  Component Value Date   WBC 8.3 02/23/2019   HGB 13.8 02/23/2019   HCT 42.6 02/23/2019   PLT 285 02/23/2019   GLUCOSE 76 02/23/2019   CHOL 147 06/24/2016   TRIG 158 (H) 06/24/2016   HDL 70 06/24/2016   LDLCALC 45 06/24/2016   ALT 11 12/21/2018   AST 17 12/21/2018   NA 143 02/23/2019   K 4.6 02/23/2019   CL 101 02/23/2019   CREATININE 1.08 02/23/2019   BUN 16 02/23/2019   CO2 28 02/23/2019   TSH 5.020 (H) 06/24/2016   INR 1.17 12/20/2014   HGBA1C 5.6 12/20/2014       BNP (last 3 results) No results for input(s): BNP in the last 8760 hours.  ProBNP (last 3 results) No results for input(s): PROBNP in the last 8760 hours.   Other Studies Reviewed Today:  CT CHEST IMPRESSION 03/2019: 1. Stable post treatment changes in the right chest. 2. New 11 mm ground-glass nodule in the posterior peripheral right upper lobe. This could be related to pneumonitis, close attention on follow-up is suggested. Short interval follow-up may be helpful, within 3 months. 3. Stable chronic loculated left pleural effusion and small amount of pericardial fluid. 4. Stable left paramediastinal and right hilar post treatment changes. 5.  Slight interval enlargement of subcarinal lymph node. Attention on follow-up.  Aortic Atherosclerosis (ICD10-I70.0) and Emphysema (ICD10-J43.9).   Electronically Signed   By: Zetta Bills M.D.   On: 03/09/2019 08:33   EchoStudy Conclusions4/2019  - Left ventricle: The cavity size was normal. Wall thickness was increased in a pattern of mild LVH. Systolic function was normal. The estimated ejection fraction was in the range of 55% to 60%. Although no diagnostic regional wall motion abnormality was identified, this possibility cannot be completely excluded on the basis of this study. Features are consistent with a pseudonormal left ventricular filling pattern, with concomitant abnormal relaxation and increased filling pressure (grade 2 diastolic dysfunction). - Aortic valve: Noncoronary cusp mobility was moderately restricted. There was mild regurgitation. Valve area (VTI): 2.61 cm^2. - Mitral valve: There was moderate regurgitation directed centrally. Diastolic regurgitation was present. - Left atrium: The atrium was moderately dilated. - Right ventricle: The cavity size was mildly dilated. Systolic function was mildly reduced. - Right atrium: The atrium was mildly dilated. - Tricuspid valve: There was mild regurgitation. Diastolic regurgitation was present. - Pulmonary arteries: Systolic pressure was mildly increased. PA peak pressure: 39 mm Hg (S). - Pericardium, extracardiac: A trivial pericardial effusion was identified.   Myoview Study Highlights from 04/2015   Nuclear stress EF: 56%.  The study is normal.  This is a low risk study.  The left ventricular ejection fraction is normal (55-65%).  Normal resting and stress perfusion EF 56%      ASSESSMENT & PLAN:   1. Chronic shortness of breath - most likely multifactorial - remains his primary issue - has had prior squamous cell lung cancer, radiatio with  fibrosis and underlying emphysema - last CT little  worrisome - seeing Oncology tomorrow. Now on oxgygen.   2. CAD - remote MI with multiple stents - no active symptoms - has had chronically abnormal EKG - favor conservative management.   3. Valvular heart disease - would favor conservative management.   4. Multiple falls - poor balance. High risk for injury.   5. Prior pericardial effusion with prior window.   6. Melanoma/Squamous cell carcinoma - seeing Oncology tomorrow.   7. HLD - on statin  8. COVID-19 Education: The signs and symptoms of COVID-19 were discussed with the patient and how to seek care for testing (follow up with PCP or arrange E-visit).  The importance of social distancing, staying at home, hand hygiene and wearing a mask when out in public were discussed today.  Current medicines are reviewed with the patient today.  The patient does not have concerns regarding medicines other than what has been noted above.  The following changes have been made:  See above.  Labs/ tests ordered today include:   No orders of the defined types were placed in this encounter.    Disposition:   FU with me in 6 months.    Patient is agreeable to this plan and will call if any problems develop in the interim.   SignedTruitt Merle, NP  06/22/2019 12:20 PM  [facility] [address] [place], [place]  [address] Phone: [phone] Fax: [phone]       Addendum: He has considerable hearing loss - he basically does not answer his phone because he can't hear - not sure if there would be an option for a hearing impaired phone - will reach out to Social Work - if available - would call his sister - Selinda Eon [phone]   Burtis Junes, RN, [place] [address] [place],   [phone]

## 2019-06-21 ENCOUNTER — Other Ambulatory Visit: Payer: Medicare Other

## 2019-06-21 ENCOUNTER — Ambulatory Visit: Payer: Medicare Other | Admitting: Hematology & Oncology

## 2019-06-22 ENCOUNTER — Encounter: Payer: Self-pay | Admitting: Nurse Practitioner

## 2019-06-22 ENCOUNTER — Ambulatory Visit: Payer: Medicare Other | Admitting: Hematology & Oncology

## 2019-06-22 ENCOUNTER — Other Ambulatory Visit: Payer: Self-pay

## 2019-06-22 ENCOUNTER — Ambulatory Visit (INDEPENDENT_AMBULATORY_CARE_PROVIDER_SITE_OTHER): Payer: Medicare Other | Admitting: Nurse Practitioner

## 2019-06-22 ENCOUNTER — Other Ambulatory Visit: Payer: Medicare Other

## 2019-06-22 VITALS — BP 134/70 | HR 75 | Ht 70.0 in | Wt 180.8 lb

## 2019-06-22 DIAGNOSIS — I259 Chronic ischemic heart disease, unspecified: Secondary | ICD-10-CM

## 2019-06-22 DIAGNOSIS — R0602 Shortness of breath: Secondary | ICD-10-CM

## 2019-06-22 DIAGNOSIS — Z85118 Personal history of other malignant neoplasm of bronchus and lung: Secondary | ICD-10-CM

## 2019-06-22 DIAGNOSIS — J438 Other emphysema: Secondary | ICD-10-CM

## 2019-06-22 NOTE — Patient Instructions (Addendum)

## 2019-06-23 ENCOUNTER — Inpatient Hospital Stay: Payer: Medicare Other | Attending: Hematology & Oncology

## 2019-06-23 ENCOUNTER — Encounter: Payer: Self-pay | Admitting: Hematology & Oncology

## 2019-06-23 ENCOUNTER — Inpatient Hospital Stay: Payer: Medicare Other

## 2019-06-23 ENCOUNTER — Inpatient Hospital Stay (HOSPITAL_BASED_OUTPATIENT_CLINIC_OR_DEPARTMENT_OTHER): Payer: Medicare Other | Admitting: Hematology & Oncology

## 2019-06-23 ENCOUNTER — Telehealth: Payer: Self-pay | Admitting: Licensed Clinical Social Worker

## 2019-06-23 VITALS — BP 125/76 | HR 71 | Temp 96.8°F | Resp 18 | Ht 70.0 in | Wt 179.0 lb

## 2019-06-23 DIAGNOSIS — I259 Chronic ischemic heart disease, unspecified: Secondary | ICD-10-CM | POA: Diagnosis not present

## 2019-06-23 DIAGNOSIS — Z79899 Other long term (current) drug therapy: Secondary | ICD-10-CM | POA: Insufficient documentation

## 2019-06-23 DIAGNOSIS — Z85820 Personal history of malignant melanoma of skin: Secondary | ICD-10-CM | POA: Diagnosis not present

## 2019-06-23 DIAGNOSIS — Z7982 Long term (current) use of aspirin: Secondary | ICD-10-CM | POA: Diagnosis not present

## 2019-06-23 DIAGNOSIS — C3431 Malignant neoplasm of lower lobe, right bronchus or lung: Secondary | ICD-10-CM | POA: Diagnosis not present

## 2019-06-23 DIAGNOSIS — D0330 Melanoma in situ of unspecified part of face: Secondary | ICD-10-CM

## 2019-06-23 DIAGNOSIS — Z85118 Personal history of other malignant neoplasm of bronchus and lung: Secondary | ICD-10-CM | POA: Insufficient documentation

## 2019-06-23 DIAGNOSIS — D693 Immune thrombocytopenic purpura: Secondary | ICD-10-CM | POA: Diagnosis not present

## 2019-06-23 DIAGNOSIS — R1011 Right upper quadrant pain: Secondary | ICD-10-CM | POA: Insufficient documentation

## 2019-06-23 DIAGNOSIS — C3492 Malignant neoplasm of unspecified part of left bronchus or lung: Secondary | ICD-10-CM

## 2019-06-23 LAB — CBC WITH DIFFERENTIAL (CANCER CENTER ONLY)
Abs Immature Granulocytes: 0.01 10*3/uL (ref 0.00–0.07)
Basophils Absolute: 0.1 10*3/uL (ref 0.0–0.1)
Basophils Relative: 1 %
Eosinophils Absolute: 0.2 10*3/uL (ref 0.0–0.5)
Eosinophils Relative: 3 %
HCT: 40.9 % (ref 39.0–52.0)
Hemoglobin: 12.5 g/dL — ABNORMAL LOW (ref 13.0–17.0)
Immature Granulocytes: 0 %
Lymphocytes Relative: 13 %
Lymphs Abs: 0.8 10*3/uL (ref 0.7–4.0)
MCH: 27.5 pg (ref 26.0–34.0)
MCHC: 30.6 g/dL (ref 30.0–36.0)
MCV: 90.1 fL (ref 80.0–100.0)
Monocytes Absolute: 0.7 10*3/uL (ref 0.1–1.0)
Monocytes Relative: 11 %
Neutro Abs: 4.5 10*3/uL (ref 1.7–7.7)
Neutrophils Relative %: 72 %
Platelet Count: 251 10*3/uL (ref 150–400)
RBC: 4.54 MIL/uL (ref 4.22–5.81)
RDW: 16.1 % — ABNORMAL HIGH (ref 11.5–15.5)
WBC Count: 6.2 10*3/uL (ref 4.0–10.5)
nRBC: 0 % (ref 0.0–0.2)

## 2019-06-23 LAB — CMP (CANCER CENTER ONLY)
ALT: 10 U/L (ref 0–44)
AST: 14 U/L — ABNORMAL LOW (ref 15–41)
Albumin: 4.1 g/dL (ref 3.5–5.0)
Alkaline Phosphatase: 128 U/L — ABNORMAL HIGH (ref 38–126)
Anion gap: 7 (ref 5–15)
BUN: 19 mg/dL (ref 8–23)
CO2: 29 mmol/L (ref 22–32)
Calcium: 9.2 mg/dL (ref 8.9–10.3)
Chloride: 108 mmol/L (ref 98–111)
Creatinine: 0.97 mg/dL (ref 0.61–1.24)
GFR, Est AFR Am: >60 mL/min (ref >60)
GFR, Estimated: >60 mL/min (ref >60)
Glucose, Bld: 110 mg/dL — ABNORMAL HIGH (ref 70–99)
Potassium: 3.7 mmol/L (ref 3.5–5.1)
Sodium: 144 mmol/L (ref 135–145)
Total Bilirubin: 0.9 mg/dL (ref 0.3–1.2)
Total Protein: 6.9 g/dL (ref 6.5–8.1)

## 2019-06-23 NOTE — Telephone Encounter (Signed)
CSW referred to assist patient with hearing aides. CSW contacted patient and message left for return call. Raquel Sarna, [place], [place]

## 2019-06-23 NOTE — Patient Instructions (Signed)

## 2019-06-23 NOTE — Progress Notes (Signed)
Hematology and Oncology Follow Up Visit  [name] Fields [id number] [birth year] 85 y.o. 06/23/2019   Principle Diagnosis:  1. 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  We see him every 6 months.  His problem now is that there is abdominal pain.  This seems to be in the right upper quadrant.  He says he has had it for several months.  It not radiating.  He has not taken anything for this.  There is no obvious change in bowel or bladder habits.  Given the fact that he has locally advanced lung cancer, he certainly is at risk for recurrent disease.  As such, I will do a CT scan of the abdomen and pelvis to make sure there is no evidence of metastatic disease.  Otherwise, he seems to be looking okay.  He has had no nausea or vomiting.  He has had no bleeding.  He has had no fever.  He does have underlying lung disease.  He is not on oxygen.  He does have a pulmonologist.  There is been no leg swelling.  He has had no headache.  Overall, his performance status is ECOG 2.  Medications:  Allergies as of 06/23/2019      Reactions   Sertraline Hcl Other (See Comments)   Patient does not remember the reaction      Medication List       Accurate as of June 23, 2019  9:46 AM. If you have any questions, ask your nurse or doctor.        albuterol 108 (90 Base) MCG/ACT inhaler Commonly known as: ProAir HFA Inhale 2 puffs into the lungs every 6 (six) hours as needed for wheezing or shortness of breath.   aspirin EC 81 MG tablet Take 81 mg by mouth daily.   atorvastatin 20 MG tablet Commonly known as: LIPITOR Take 20 mg by mouth daily.   BIOFREEZE EX Apply topically as directed.   buPROPion 150 MG 12 hr tablet Commonly known as: WELLBUTRIN  SR Take 150 mg by mouth daily.   finasteride 5 MG tablet Commonly known as: PROSCAR Take 5 mg by mouth daily.   loratadine 10 MG tablet Commonly known as: CLARITIN Take 1 tablet (10 mg total) by mouth daily.   pantoprazole 40 MG tablet Commonly known as: PROTONIX Take 1 tablet by mouth daily.   tamsulosin 0.4 MG Caps capsule Commonly known as: FLOMAX Take 0.4 mg by mouth daily with supper.   temazepam 30 MG capsule Commonly known as: RESTORIL Take 30 mg by mouth at bedtime   venlafaxine XR 150 MG 24 hr capsule Commonly known as: EFFEXOR-XR Take 150 mg by mouth daily.   Vitamin D3 50 MCG (2000 UT) Tabs Take 2,000 Units by mouth 2 (two) times daily.       Allergies:  Allergies  Allergen Reactions  . Sertraline Hcl Other (See Comments)    Patient does not remember the reaction    Past Medical History, Surgical history, Social history, and Family History were reviewed and updated.  Review of Systems: Review of Systems  Constitutional: Negative.   HENT: Negative.   Eyes: Negative.   Respiratory: Negative.   Cardiovascular: Negative.   Gastrointestinal: Negative.   Genitourinary:  Positive for frequency.  Musculoskeletal: Negative.   Skin: Negative.   Neurological: Positive for dizziness.  Endo/Heme/Allergies: Negative.   Psychiatric/Behavioral: Negative.      Physical Exam:  height is 5\' 10"  (1.778 m) and weight is 179 lb (81.2 kg). His temporal temperature is 96.8 F (36 C) (abnormal). His blood pressure is 125/76 and his pulse is 71. His respiration is 18 and oxygen saturation is 95%.   Wt Readings from Last 3 Encounters:  06/23/19 179 lb (81.2 kg)  06/22/19 180 lb 12.8 oz (82 kg)  04/05/19 173 lb 9.6 oz (78.7 kg)    Physical Exam Vitals reviewed.  HENT:     Head: Normocephalic and atraumatic.  Eyes:     Pupils: Pupils are equal, round, and reactive to light.  Cardiovascular:     Rate and Rhythm: Normal rate and regular rhythm.     Heart  sounds: Normal heart sounds.  Pulmonary:     Effort: Pulmonary effort is normal.     Breath sounds: Normal breath sounds.  Abdominal:     General: Bowel sounds are normal.     Palpations: Abdomen is soft.  Musculoskeletal:        General: No tenderness or deformity. Normal range of motion.     Cervical back: Normal range of motion.  Lymphadenopathy:     Cervical: No cervical adenopathy.  Skin:    General: Skin is warm and dry.     Findings: No erythema or rash.  Neurological:     Mental Status: He is alert and oriented to person, place, and time.  Psychiatric:        Behavior: Behavior normal.        Thought Content: Thought content normal.        Judgment: Judgment normal.      Lab Results  Component Value Date   WBC 6.2 06/23/2019   HGB 12.5 (L) 06/23/2019   HCT 40.9 06/23/2019   MCV 90.1 06/23/2019   PLT 251 06/23/2019   Lab Results  Component Value Date   FERRITIN 92 12/20/2014   IRON 95 12/20/2014   TIBC 405 12/20/2014   UIBC 310 12/20/2014   IRONPCTSAT 23 12/20/2014   Lab Results  Component Value Date   RETICCTPCT 1.8 12/20/2014   RBC 4.54 06/23/2019   Lab Results  Component Value Date   KAPLAMBRATIO 1.98 (L) 12/20/2014   No results found for: Kandis Cocking, IGMSERUM Lab Results  Component Value Date   TOTALPROTELP 6.9 12/20/2014   ALBUMINELP 3.4 12/20/2014   A1GS 0.3 12/20/2014   A2GS 0.9 12/20/2014   BETS 1.3 12/20/2014   GAMS 1.1 12/20/2014   MSPIKE Not Observed 12/20/2014   SPEI Comment 12/20/2014     Chemistry      Component Value Date/Time   NA 144 06/23/2019 0900   NA 143 02/23/2019 1222   NA 144 02/02/2017 0842   NA 140 12/01/2016 0957   K 3.7 06/23/2019 0900   K 3.8 02/02/2017 0842   K 4.5 12/01/2016 0957   CL 108 06/23/2019 0900   CL 105 02/02/2017 0842   CL 104 08/27/2012 1421   CO2 29 06/23/2019 0900   CO2 29 02/02/2017 0842   CO2 24 12/01/2016 0957   BUN 19 06/23/2019 0900   BUN 16 02/23/2019 1222   BUN 13 02/02/2017  0842   BUN 21.2 12/01/2016 0957   CREATININE 0.97 06/23/2019 0900   CREATININE 1.1 02/02/2017 0842   CREATININE 1.1 12/01/2016 0957  Component Value Date/Time   CALCIUM 9.2 06/23/2019 0900   CALCIUM 9.5 02/02/2017 0842   CALCIUM 10.2 12/01/2016 0957   ALKPHOS 128 (H) 06/23/2019 0900   ALKPHOS 104 (H) 02/02/2017 0842   ALKPHOS 111 12/01/2016 0957   AST 14 (L) 06/23/2019 0900   AST 30 12/01/2016 0957   ALT 10 06/23/2019 0900   ALT 28 02/02/2017 0842   ALT 24 12/01/2016 0957   BILITOT 0.9 06/23/2019 0900   BILITOT 0.92 12/01/2016 0957      Impression and Plan: Mr. Lua is a very pleasant 85 yo caucasian gentleman with history of both right and left lung cancers as well as melanoma. His most recent malignancy was stage I lung cancer diagnosed in April 2013. He had stereotactic radiosurgery that same year in July.  Again, we will get the CT scan of his abdomen.  We will do this next week.  We will still maintain 83-month follow-up.  If there is anything on the CT scan that is suspicious, we will get him back sooner.   Volanda Napoleon, MD 3/18/20219:46 AM

## 2019-06-28 DIAGNOSIS — E782 Mixed hyperlipidemia: Secondary | ICD-10-CM | POA: Diagnosis not present

## 2019-06-28 DIAGNOSIS — E7800 Pure hypercholesterolemia, unspecified: Secondary | ICD-10-CM | POA: Diagnosis not present

## 2019-06-28 DIAGNOSIS — I10 Essential (primary) hypertension: Secondary | ICD-10-CM | POA: Diagnosis not present

## 2019-06-28 DIAGNOSIS — I5030 Unspecified diastolic (congestive) heart failure: Secondary | ICD-10-CM | POA: Diagnosis not present

## 2019-06-28 DIAGNOSIS — H18413 Arcus senilis, bilateral: Secondary | ICD-10-CM | POA: Diagnosis not present

## 2019-06-28 DIAGNOSIS — N400 Enlarged prostate without lower urinary tract symptoms: Secondary | ICD-10-CM | POA: Diagnosis not present

## 2019-06-28 DIAGNOSIS — F329 Major depressive disorder, single episode, unspecified: Secondary | ICD-10-CM | POA: Diagnosis not present

## 2019-06-28 DIAGNOSIS — Z961 Presence of intraocular lens: Secondary | ICD-10-CM | POA: Diagnosis not present

## 2019-06-28 DIAGNOSIS — N401 Enlarged prostate with lower urinary tract symptoms: Secondary | ICD-10-CM | POA: Diagnosis not present

## 2019-06-28 DIAGNOSIS — J441 Chronic obstructive pulmonary disease with (acute) exacerbation: Secondary | ICD-10-CM | POA: Diagnosis not present

## 2019-06-28 DIAGNOSIS — H11002 Unspecified pterygium of left eye: Secondary | ICD-10-CM | POA: Diagnosis not present

## 2019-06-28 DIAGNOSIS — C3430 Malignant neoplasm of lower lobe, unspecified bronchus or lung: Secondary | ICD-10-CM | POA: Diagnosis not present

## 2019-06-29 DIAGNOSIS — N401 Enlarged prostate with lower urinary tract symptoms: Secondary | ICD-10-CM | POA: Diagnosis not present

## 2019-06-29 DIAGNOSIS — I10 Essential (primary) hypertension: Secondary | ICD-10-CM | POA: Diagnosis not present

## 2019-06-29 DIAGNOSIS — E7800 Pure hypercholesterolemia, unspecified: Secondary | ICD-10-CM | POA: Diagnosis not present

## 2019-06-29 DIAGNOSIS — F322 Major depressive disorder, single episode, severe without psychotic features: Secondary | ICD-10-CM | POA: Diagnosis not present

## 2019-06-29 DIAGNOSIS — C3430 Malignant neoplasm of lower lobe, unspecified bronchus or lung: Secondary | ICD-10-CM | POA: Diagnosis not present

## 2019-06-29 DIAGNOSIS — I252 Old myocardial infarction: Secondary | ICD-10-CM | POA: Diagnosis not present

## 2019-06-29 DIAGNOSIS — J449 Chronic obstructive pulmonary disease, unspecified: Secondary | ICD-10-CM | POA: Diagnosis not present

## 2019-06-29 DIAGNOSIS — M65352 Trigger finger, left little finger: Secondary | ICD-10-CM | POA: Diagnosis not present

## 2019-06-29 DIAGNOSIS — E782 Mixed hyperlipidemia: Secondary | ICD-10-CM | POA: Diagnosis not present

## 2019-06-29 DIAGNOSIS — I2510 Atherosclerotic heart disease of native coronary artery without angina pectoris: Secondary | ICD-10-CM | POA: Diagnosis not present

## 2019-06-29 DIAGNOSIS — I5030 Unspecified diastolic (congestive) heart failure: Secondary | ICD-10-CM | POA: Diagnosis not present

## 2019-06-29 DIAGNOSIS — M179 Osteoarthritis of knee, unspecified: Secondary | ICD-10-CM | POA: Diagnosis not present

## 2019-07-08 DIAGNOSIS — E782 Mixed hyperlipidemia: Secondary | ICD-10-CM | POA: Diagnosis not present

## 2019-07-08 DIAGNOSIS — I5030 Unspecified diastolic (congestive) heart failure: Secondary | ICD-10-CM | POA: Diagnosis not present

## 2019-07-08 DIAGNOSIS — I252 Old myocardial infarction: Secondary | ICD-10-CM | POA: Diagnosis not present

## 2019-07-08 DIAGNOSIS — N401 Enlarged prostate with lower urinary tract symptoms: Secondary | ICD-10-CM | POA: Diagnosis not present

## 2019-07-08 DIAGNOSIS — F322 Major depressive disorder, single episode, severe without psychotic features: Secondary | ICD-10-CM | POA: Diagnosis not present

## 2019-07-08 DIAGNOSIS — I10 Essential (primary) hypertension: Secondary | ICD-10-CM | POA: Diagnosis not present

## 2019-07-08 DIAGNOSIS — J449 Chronic obstructive pulmonary disease, unspecified: Secondary | ICD-10-CM | POA: Diagnosis not present

## 2019-07-08 DIAGNOSIS — Z85118 Personal history of other malignant neoplasm of bronchus and lung: Secondary | ICD-10-CM | POA: Diagnosis not present

## 2019-07-08 DIAGNOSIS — J441 Chronic obstructive pulmonary disease with (acute) exacerbation: Secondary | ICD-10-CM | POA: Diagnosis not present

## 2019-07-08 DIAGNOSIS — N400 Enlarged prostate without lower urinary tract symptoms: Secondary | ICD-10-CM | POA: Diagnosis not present

## 2019-07-08 DIAGNOSIS — E7800 Pure hypercholesterolemia, unspecified: Secondary | ICD-10-CM | POA: Diagnosis not present

## 2019-07-11 ENCOUNTER — Ambulatory Visit (HOSPITAL_BASED_OUTPATIENT_CLINIC_OR_DEPARTMENT_OTHER): Payer: Medicare Other

## 2019-08-06 DEATH — deceased

## 2019-08-09 ENCOUNTER — Inpatient Hospital Stay: Payer: 59

## 2019-08-09 ENCOUNTER — Other Ambulatory Visit: Payer: Medicare Other

## 2019-08-18 ENCOUNTER — Other Ambulatory Visit: Payer: Medicare Other

## 2019-10-13 ENCOUNTER — Other Ambulatory Visit: Payer: Medicare Other

## 2019-12-22 ENCOUNTER — Ambulatory Visit: Payer: Medicare Other | Admitting: Hematology & Oncology

## 2019-12-22 ENCOUNTER — Other Ambulatory Visit: Payer: Medicare Other

## 2020-01-04 ENCOUNTER — Ambulatory Visit: Payer: Medicare Other | Admitting: Nurse Practitioner
